# Patient Record
Sex: Female | Born: 1935
Health system: Southern US, Community
[De-identification: ages and names within clinical notes are randomized; demographics above are authoritative.]

## PROBLEM LIST (undated history)

## (undated) DIAGNOSIS — E7800 Pure hypercholesterolemia, unspecified: Secondary | ICD-10-CM

## (undated) DIAGNOSIS — C4490 Unspecified malignant neoplasm of skin, unspecified: Secondary | ICD-10-CM

## (undated) DIAGNOSIS — M1990 Unspecified osteoarthritis, unspecified site: Secondary | ICD-10-CM

## (undated) DIAGNOSIS — I219 Acute myocardial infarction, unspecified: Secondary | ICD-10-CM

## (undated) DIAGNOSIS — I2510 Atherosclerotic heart disease of native coronary artery without angina pectoris: Secondary | ICD-10-CM

## (undated) DIAGNOSIS — C50919 Malignant neoplasm of unspecified site of unspecified female breast: Secondary | ICD-10-CM

## (undated) DIAGNOSIS — N879 Dysplasia of cervix uteri, unspecified: Secondary | ICD-10-CM

## (undated) DIAGNOSIS — M4807 Spinal stenosis, lumbosacral region: Secondary | ICD-10-CM

## (undated) DIAGNOSIS — K219 Gastro-esophageal reflux disease without esophagitis: Secondary | ICD-10-CM

## (undated) DIAGNOSIS — I10 Essential (primary) hypertension: Secondary | ICD-10-CM

## (undated) DIAGNOSIS — G4730 Sleep apnea, unspecified: Secondary | ICD-10-CM

## (undated) HISTORY — DX: Acute myocardial infarction, unspecified: I21.9

## (undated) HISTORY — PX: NM MYOVIEW LTD: HXRAD82

## (undated) HISTORY — DX: Essential (primary) hypertension: I10

## (undated) HISTORY — DX: Unspecified malignant neoplasm of skin, unspecified: C44.90

## (undated) HISTORY — DX: Pure hypercholesterolemia, unspecified: E78.00

## (undated) HISTORY — PX: MASTECTOMY: SHX3

## (undated) HISTORY — PX: OTHER SURGICAL HISTORY: SHX169

## (undated) HISTORY — PX: COLONOSCOPY: SHX174

## (undated) HISTORY — DX: Malignant neoplasm of unspecified site of unspecified female breast: C50.919

## (undated) HISTORY — PX: ABDOMINAL HYSTERECTOMY: SHX81

## (undated) HISTORY — PX: DIAGNOSTIC MAMMOGRAM: HXRAD719

## (undated) HISTORY — DX: Atherosclerotic heart disease of native coronary artery without angina pectoris: I25.10

## (undated) HISTORY — DX: Gastro-esophageal reflux disease without esophagitis: K21.9

## (undated) HISTORY — DX: Unspecified osteoarthritis, unspecified site: M19.90

## (undated) HISTORY — DX: Sleep apnea, unspecified: G47.30

## (undated) HISTORY — DX: Spinal stenosis, lumbosacral region: M48.07

## (undated) HISTORY — DX: Dysplasia of cervix uteri, unspecified: N87.9

---

## 1997-08-09 ENCOUNTER — Other Ambulatory Visit: Admission: RE | Admit: 1997-08-09 | Discharge: 1997-08-09 | Payer: Self-pay | Admitting: *Deleted

## 1998-02-19 ENCOUNTER — Other Ambulatory Visit: Admission: RE | Admit: 1998-02-19 | Discharge: 1998-02-19 | Payer: Self-pay | Admitting: Obstetrics and Gynecology

## 1998-03-14 ENCOUNTER — Other Ambulatory Visit: Admission: RE | Admit: 1998-03-14 | Discharge: 1998-03-14 | Payer: Self-pay | Admitting: Obstetrics and Gynecology

## 1999-01-13 DIAGNOSIS — I2510 Atherosclerotic heart disease of native coronary artery without angina pectoris: Secondary | ICD-10-CM

## 1999-01-13 HISTORY — PX: CARDIAC CATHETERIZATION: SHX172

## 1999-01-13 HISTORY — DX: Atherosclerotic heart disease of native coronary artery without angina pectoris: I25.10

## 1999-01-13 HISTORY — PX: CORONARY STENT INTERVENTION: CATH118234

## 1999-02-03 ENCOUNTER — Encounter: Payer: Self-pay | Admitting: Internal Medicine

## 1999-02-03 ENCOUNTER — Encounter: Admission: RE | Admit: 1999-02-03 | Discharge: 1999-02-03 | Payer: Self-pay | Admitting: Internal Medicine

## 1999-02-04 ENCOUNTER — Other Ambulatory Visit: Admission: RE | Admit: 1999-02-04 | Discharge: 1999-02-04 | Payer: Self-pay | Admitting: Internal Medicine

## 1999-02-06 ENCOUNTER — Inpatient Hospital Stay (HOSPITAL_COMMUNITY): Admission: RE | Admit: 1999-02-06 | Discharge: 1999-02-14 | Payer: Self-pay | Admitting: Internal Medicine

## 1999-08-17 DIAGNOSIS — I25119 Atherosclerotic heart disease of native coronary artery with unspecified angina pectoris: Secondary | ICD-10-CM | POA: Insufficient documentation

## 2000-01-13 HISTORY — PX: CORONARY ARTERY BYPASS GRAFT: SHX141

## 2010-05-30 NOTE — H&P (Signed)
[place]  Patient:    Stephanie Shea                         MRN: [id number] Adm. Date:  [id number] Attending:  Silvestre Mesi Dictator:   Donzetta Matters, P.A.                         History and Physical  DATE OF BIRTH:  October 27, 1930  CHIEF COMPLAINT:  Chest heaviness.  HISTORY OF PRESENT ILLNESS:  This is a 77 year old female that was seen for Dr.  Lendell Caprice when she was having some increase in arm heaviness.  This has occurred the past two to three months with exercise.  She does take some water aerobics that  causes mild discomfort.  She did have a treadmill done on February 05, 1999 which was strongly positive at three minutes in Bruce stage I.  FAMILY HISTORY:  Mother is age 61 with coronary artery bypass grafting.  Father  died at age 6 of MI.  SOCIAL HISTORY:  She is married.  She does drink caffeinated coffee.  Denies any tobacco use.  Denies any drug abuse.  PAST MEDICAL HISTORY:  Notable for hypertension, postmenopausal.  PAST SURGICAL HISTORY:  Includes breast augmentation.  ALLERGIES:  She is sensitive to codeine, which causes nausea.  CURRENT MEDICATIONS: 1.  PremPro 1 q.d. 2.  Enteric-coated aspirin 81 mg q.d. 3.  Vitamin E 400 units q.d. 4.  Zestoretic 20/25 1/2 tablet q.d. 5.  Toprol XL 50 mg 1/2 tablet q.d. 6.  Tylenol as-needed. 7.  Tums 2 tablets q.d. 8.  Multivitamin 1 q.d.  REVIEW OF SYSTEMS:  She denies any fever, chills, or sweats.  States her weight has remained stable.  She has had no swelling or edema, no problems with sleeping.  Denies any chest pain, palpitations, or dyspnea.  Denies any claudication, no cough or wheezing.  Continues to be a nonsmoker.  No sputum production.  She has some  constipation secondary to calcium supplements.  Denies any nausea, vomiting, or  heartburn.  Denies any urinary problems.  No nocturia.  States she has left hip  pain and some fatigue but otherwise gait  is steady.  She does not use any assistive devices for ambulation.  Denies any faintness, no strokes, no syncope, no dizziness.  No change in memory.  Otherwise all other systems are negative.  PHYSICAL EXAMINATION:  VITAL SIGNS:  Weight 161 pounds.  Blood pressure 124/70, pulse 76, respirations 18. She is afebrile.  GENERAL:  She is a well-developed, well-nourished female, at the present time with good nutrition.  Very fit-appearing female that is well cared for.  She is oriented to person, place, time, and situation.  Appears to have good judgment and insight.  HEENT:  Pupils equal.  EOMI.  Mouth and pharynx benign.  NECK:  Supple, no JVD, no bruits, no adenopathy.  CHEST:  Clear to auscultation.  There is palpable chest wall discomfort.  HEART:  Regular rate and rhythm, no murmurs noted.  ABDOMEN:  Soft, flat, active bowel sounds; no hepatosplenomegaly.  No abdominal  bruits.  EXTREMITIES:  Good pulses at dorsalis pedis.  No edema noted to extremities. There is no cyanosis or clubbing to fingers or digits.  She is ambulatory without any  difficulty.  SKIN:   Intact.  No cyanosis or rashes noted.  NEUROLOGIC:  Grossly nonfocal.  LABORATORY DATA:  Chest x-ray shows no acute disease on February 05, 1999. Abnormal treadmill with EKG changes.  Labs will be obtained before heart catheterization. Diagnosis is abnormal EKG, hypertension, angina pectoris.  Labs reviewed show WBC at 5900, hemoglobin 12.3, hematocrit 35.9, platelet count 273,000.  Urinalysis normal.  PTT 23.4, PT 11.4 with INR 0.92.  Basic metabolic panel does show sodium of 140, potassium 4.4, chloride 103, CO2 29, glucose 87, BUN 16, creatinine 0.5.  PLAN:  Heart catheterization with intervention as indicated. DD:  02/06/99 TD:  02/06/99 Job: [id number]

## 2010-05-30 NOTE — Cardiovascular Report (Signed)
[place]  Patient:    Stephanie Shea                         MRN: [id number] Proc. Date: 02/06/99 Adm. Date:  [id number] Attending:  Silvestre Mesi CC:         Janae Bridgeman. Eloise Harman., M.D.             John R. Aleen Campi, M.D.             Cardiac Catheterization Laboratory                        Cardiac Catheterization  PROCEDURES PERFORMED:  1. Left heart catheterization.  2. Coronary cineangiography.  3. Left ventricular cineangiography.  4. Abdominal aortogram.  5. IVUS of left main and left anterior descending artery.  6. Angioplasty with primary stent placement in the proximal left anterior     descending artery.  7. Percutaneous transluminal coronary angioplasty of the first diagonal branch.  8. Angioplasty with primary stent placement of the proximal circumflex.  9. Percutaneous transluminal coronary angioplasty of the distal left anterior     descending artery. 10. Attempt to open totally occluded distal circumflex with probable atheromatous     embolus. 11. Perclose of the right femoral artery.  INDICATIONS:  This 77 year old female presented with several months history of aching in her arm on exertion and her resting electrocardiogram was abnormal. he has a history of hypertension, successfully treated with antihypertensives.  A stress test was markedly positive, indicating evidence of myocardial ischemia.   DESCRIPTION OF PROCEDURE:  After signing an informed consent, the patient was premedicated with 50 mg Benadryl intravenously and brought to the cardiac catheterization laboratory.  Her right groin was prepped and draped a sterile fashion and anesthetized locally with 1% Lidocaine.  A 6-French introducer sheath was inserted percutaneously into the right femoral artery.  Six [place] #4 Judkins coronary catheters were used to make injections into the coronary arteries.  A 6-French pigtail catheter was used to measure  pressures in the left ventricle and aorta and to make midstream injections into the left ventricle and abdominal aorta. After noting a narrow ostium of the left main in association with severe stenosis in her first diagonal branch and proximal circumflex, we discussed these findings with two other cardiologists who were present the laboratory and elected to proceed with an IVUS examination to look for significant plaque in the left main coronary artery.  If this was present, she would be a candidate for surgery.  If not, she should have angioplasty of her proximal lesions because of the presence of distal stenosis also and making her less of an ideal candidate for surgery.  A 6-French JL-4 guide catheter was selected and after proper preparation, this as inserted through the right femoral artery sheath and advanced to the root of the aorta.  The tip of this guide catheter was engaged in the ostium of the left coronary artery.  A short high-torque floppy guide wire was inserted through the guide catheter and advanced into the left coronary artery.  With moderate difficult, it was then advanced into the LAD with the tip advanced into the mid and distal segment of the left anterior descending artery.  We then inserted an IVUS catheter, which was advanced into the mid LAD and performed an automatic withdrawal run, looking at the proximal LAD and left main coronary artery.  A  surprising finding of a severe stenosis within the proximal LAD was found, where angiographically it appeared to be a mild stenosis.  When the IVUS reached the eft main, we found a naturally tapering left main toward the ostium, but no evidence for significant plaque.  With these findings and after further discussion with he two other cardiologists, we elected to proceed with angioplasty procedures.  After removing the IVUS catheter, we selected a 3.0 x 15 mm Royale stent delivery system and after proper  preparation this was inserted over the guide wire and advanced within the proximal LAD lesion.  The stent was then deployed with one inflation at 16 atm x 30 seconds.  We then withdrew this deployment balloon and  redirected the guide catheter into the first diagonal branch and after moderate  difficulty it was advanced through the proximal lesion and into the distal diagonal branch.  We then selected a balloon catheter which was a 2.5 x 10 mm balloon catheter, which was advanced over the guide wire and positioned within the proximal diagonal lesion.  One inflation was made at 6 atm x 2 minutes.  After this inflation, the balloon was removed and injection again into the LAD showed an excellent angiographic result in both angioplasty sites.  We then withdrew this balloon and redirected the guide wire into the circumflex and positioned the tip in the distal circumflex.  We then selected a 3.5 x 11 mm Royale stent deployment system and after proper preparation, this was inserted over the guide wire and positioned within the proximal circumflex.  This stent was then deployed with one inflation at 14 atm x 30 seconds.  After this inflation, the balloon catheter was removed and injection again into the left coronary artery showed an excellent angiographic result in the proximal circumflex lesion.  With these findings, we then redirected the guide wire into the LAD and directed it into the distal LAD.  We were able to guide it across the distal severe stenosis in the LAD and at that time, we reinserted the 2.5 x 10 mm balloon catheter and advanced it over the guide wire and into the distal LAD lesion.  One inflation as made at 6 atm x 2 minutes.  After this final inflation, another injection into he left coronary artery showed an excellent angiographic result in this distal LAD  lesion.  At that time, we noted a hang-up of dye in the distal circumflex and further injections showed  total occlusion of the most distal small obtuse marginal branch.  We redirected the wire into this area and after multiple attempts, we ere  unable to cross through this stenosis, even with backup of a balloon catheter within the proximal portion of the this branch and we felt that this probably represented an atheromatous embolus from the proximal circumflex, which embolized during the stent placement.  We did not feel that it was an in situ thrombus because it was very hard and we were unable to pass a guide wire through it, which would have been the case if it were a fresh soft thrombus.  We failed to reestablish antegrade flow in this distal circumflex branch.  The patient tolerated this procedure well and no complications were otherwise noted.  She denied having any chest pain.  Her rhythm remained normal, without ectopy or arrhythmia.  At he end of the procedure, the catheter and sheath were removed from the right femoral artery and hemostasis was easily obtained with a Perclose closure system.  MEDICATIONS GIVEN:  Versed 1 mg IV x 3, Versed 2 mg IV x 1, morphine 1 mg IV x , morphine 2 mg IV x 1.  RESULTS:  HEMODYNAMIC DATA:  Left ventricular pressure 143/0-15.  Aortic pressure 148/79,  with a mean of 107.  Left ventricular ejection fraction was estimated at approximately 70%.  CINE FINDINGS: 1. Left main coronary artery:  The initial injections into the left coronary artery    showed a 50% or greater ostial lesion in the left main with gradual tapering  toward the distal segment.  There was lack of dye flushing into the aortic root.    Follow-up cines primarily of the left main after nitroglycerin intracoronary was    given, showed a marked improvement in the left main appearance.  2. Left anterior descending artery:  The proximal LAD angiographically has a mild    to moderate stenosis of less than 50% in its proximal segment.  This lesion as    fairly hazy.   The first diagonal branch had a focal eccentric 95% stenosis in    its proximal segment.  The distal LAD near the apex had a critical 95% stenosis    which was eccentric and focal.  There is a moderate stenosis at the apex.  3. Circumflex coronary artery:  The circumflex coronary artery had a focal    eccentric 90% stenosis near its takeoff.  The distal circumflex appeared normal.  4. Right coronary artery:  The right coronary artery appears normal.  LEFT VENTRICULAR CINEANGIOGRAM:  The left ventricular chamber size and contractility are normal.  The mitral and aortic valves appear normal.  The left ventricular wall thickness appears normal.  ABDOMINAL AORTOGRAM:  The abdominal aorta, renal arteries, and common iliac arteries all appear normal.  ANGIOPLASTY CINES:  Cines taken during the angioplasty procedure showed proper positioning of the guide wire, balloon catheters, and stent deployment systems within all four lesions.  Follow-up injections into these same areas showed excellent angiographic result in all four sites with 0% residual lesion and normal antegrade flow.  There was no sign of localized dissection or clot within the four lesions.  Final injections showed distal occlusion of the circumflex and the most distal obtuse marginal branch, as previously mentioned, with further cines showing the guide wire and balloon catheter within this branch and failure to cross the  embolized area with the guide wire.  Final cines showed continued closure of this branch.  FINAL DIAGNOSES:  1. Two-vessel coronary artery disease.  2. Congenitally tapered left main without significant atherosclerosis.  3. Severe proximal left anterior descending artery lesion seen on intravascular     ultrasound.  4. Normal left ventricular function.  5. Normal mitral and aortic valves.  6. Normal abdominal aorta and renal arteries.  7. Successful stenting of the proximal left anterior  descending artery.  8. Successful percutaneous transluminal coronary angioplasty of the first     diagonal.  9. Successful stenting of the proximal circumflex. 10. Successful percutaneous transluminal coronary angioplasty of the distal     left anterior descending artery. 11. Unsuccessful attempt to reopen the distal circumflex obtuse marginal branch     with probable atheromatous embolus. 12. Successful Perclose of the right femoral artery.  DISPOSITION:  Will admit to ICU for further monitoring and continue on ReoPro drip. DD:  02/06/99 TD:  02/07/99 Job: [id number]

## 2010-05-30 NOTE — Discharge Summary (Signed)
[place]  Patient:    Stephanie Shea                         MRN: [id number] Adm. Date:  [id number] Disc. Date: [id number] Attending:  Silvestre Mesi Dictator:   Donzetta Matters, P.A.-C. CC:         Janae Bridgeman. Eloise Harman., M.D.             [facility]                           Discharge Summary  DATE OF BIRTH:  Oct 26, 1933  PRINCIPAL DIAGNOSES ON DISCHARGE:  1. Two-vessel coronary artery disease.  2. Myocardial infarction post embolization of distal OM-1.  3. Atrial fibrillation resolved.  4. Hypertension.  PROCEDURES:  Left heart catheterization February 06, 1999, with percutaneous transluminal coronary angioplasty and stent left anterior descending, PTCA distal left anterior descending, PTCA first diagonal with IVUS, left main and left anterior descending, PTCA and stent of proximal circumflex, attempted opening of occluded distal obtuse marginal #1, 2D echocardiogram, February 07, 1999.  CONSULTS:  1. John R. Aleen Campi, M.D., invasive cardiology.  2. Dietary.  CONDITION ON DISCHARGE:  Improved.  HOSPITAL COURSE:  This is a 77 year old female that was admitted electively for  heart catheterization after marked changes on treadmill done in the office. Screening labs were obtained and they were stable.  She underwent cardiac catheterization on January 25 with results showing two-vessel coronary artery disease, a congenitally tapered left main without atheroma, severe proximal LAD  lesion seen on IVUS, with normal left ventricular function.  She has normal mitral and aortic valve, normal abdominal aorta and renal arteries.  She underwent successful stenting of the proximal LAD, successful PTCA of the first diagonal,  successful stenting of the proximal circumflex, successful PTCA of distal LAD, unsuccessful attempt reopen the distal OM-1 branch with embolus.  She had successful Perclose of the right femoral artery.   She did have ReoPro drip overnight.  The next day, she remained comfortable.  CKs did return showing increase to 1430 with MB of 185.8 with index 13.0.  This was repeated again on January 26 with CK decreased down to 1116 with MB 138.3, relative index 12.4. he was then followed over the weekend.  Exam continued to be benign.  Groin was okay without any difficulty.  She has been ambulated in the halls.  She had been started on Coumadin after she had an episode of atrial fibrillation that occurred on January 28.  Also, her Toprol was increased to 150 mg daily.  A 2D echocardiogram showed left ventricular ejection fraction of 60% with overall good normal left ventricular contractility.  There was a small area of hypokinesis at the apex, otherwise normal.  On January 30, she continued to remain stable.  She was still on Cardizem drip at that time.  She was then stopped on that and encouraged to begin walking in the halls which she tolerated without any difficulty.  There were no  further episodes of the atrial fibrillation.  Long discussions were held with her regarding that she would need to have a cardiac rehab facility near her home in  [facility].  Her INRs were followed daily while she was anticoagulated with the Coumadin.  She continued on full aspirin a day as well as Plavix daily.  By February 14, 1999, INR was  then up to 1.6.  Monitor continued to show sinus brady to 58 or sinus rhythm to 78.  She has been ambulating in the halls without any difficulty.  She was visited with the dietary to discuss low fat, low cholesterol diet.  Also, while in hospital, she was started on lipid-lowering drugs of Zocor 40 mg daily; and, by February 14, 1999, it was felt that she was stable enough for discharge to home in a stable and improved condition.  LABORATORY DATA:  CBC on admission did show a white count at 11,900, hemoglobin  12.5, hematocrit 35.2, platelet count 363.  Pro times  were followed closely. On January 30, her pro time was 13.9 with INR of 1.2.  The next day, it was 1.3, then 1.5; and on February 14, 1999, pro time was 16.7, INR was 1.6.  Chemistries on January 29 showed sodium at 134, potassium 3.6, chloride 96, CO2 29, BUN 9, creatinine 0.4, glucose 118.  Initial total CK on January 25, of 765, CK-MB of 115.7, relative index of 15.1.  This was repeated again eight hours later with total CK at 1430, CK-MB 185.8, relative index 13.0, and third set of enzymes did show total CK at 1116, CK-MB 138.3, relative index 12.4.  A 2-Dechocardiogram with ejection fraction of 50 to 65%, apical hypokinesis, otherwise normal aortic valve, normal mitral valve, normal right ventricular size, normal left ventricular wall thickness.  EKG, January 25, showed normal sinus rhythm with nonspecific changes.  This was repeated again on January 26 which showed normal sinus rhythm, rate at 92, with nonspecific changes.  Chest x-ray done at the office showed no acute abnormalities.  Patient was felt  ready for discharge to home on February 14, 1999.  DISCHARGE MEDICATIONS:  1. Coumadin 6 mg p.o. daily.  2. Prempro 0.625mg /2.5 mg daily.  3. Toprol XL 150 mg daily.  4. Zocor 40 mg daily at bedtime,  5. Zestoretic 20/25 one half tablet daily.  6. Coated aspirin 325 daily.  7. Vitamin E 400 units daily.  8. Multivitamin daily.  9. Plavix 75 mg daily. 10. Tums two tablets daily.  ACTIVITY:  As tolerated.  DIET:  Follow a low-cholesterol diet.  DISCHARGE INSTRUCTIONS:  Continue to monitor the right groin.  Recheck pro time  Monday.  She is to see Dr. Lucas Mallow in two weeks.  Rehab program at [facility], [place], [place].  Phone number is [phone].  She is o come by the office to sign transfer information so we can better coordinate her  care closer to home. DD:  02/14/99 TD:  02/15/99 Job: [id number]

## 2014-12-13 HISTORY — PX: TRANSTHORACIC ECHOCARDIOGRAM: SHX275

## 2015-01-22 DIAGNOSIS — M4806 Spinal stenosis, lumbar region: Secondary | ICD-10-CM | POA: Diagnosis not present

## 2015-01-22 DIAGNOSIS — M4310 Spondylolisthesis, site unspecified: Secondary | ICD-10-CM | POA: Diagnosis not present

## 2015-01-22 DIAGNOSIS — M545 Low back pain: Secondary | ICD-10-CM | POA: Diagnosis not present

## 2015-01-24 DIAGNOSIS — D0471 Carcinoma in situ of skin of right lower limb, including hip: Secondary | ICD-10-CM | POA: Diagnosis not present

## 2015-01-24 DIAGNOSIS — C44712 Basal cell carcinoma of skin of right lower limb, including hip: Secondary | ICD-10-CM | POA: Diagnosis not present

## 2015-01-24 DIAGNOSIS — C44612 Basal cell carcinoma of skin of right upper limb, including shoulder: Secondary | ICD-10-CM | POA: Diagnosis not present

## 2015-01-24 DIAGNOSIS — D485 Neoplasm of uncertain behavior of skin: Secondary | ICD-10-CM | POA: Diagnosis not present

## 2015-01-29 DIAGNOSIS — M4806 Spinal stenosis, lumbar region: Secondary | ICD-10-CM | POA: Diagnosis not present

## 2015-01-29 DIAGNOSIS — M5125 Other intervertebral disc displacement, thoracolumbar region: Secondary | ICD-10-CM | POA: Diagnosis not present

## 2015-01-29 DIAGNOSIS — M47816 Spondylosis without myelopathy or radiculopathy, lumbar region: Secondary | ICD-10-CM | POA: Diagnosis not present

## 2015-02-05 DIAGNOSIS — M4806 Spinal stenosis, lumbar region: Secondary | ICD-10-CM | POA: Diagnosis not present

## 2015-02-20 DIAGNOSIS — I10 Essential (primary) hypertension: Secondary | ICD-10-CM | POA: Diagnosis not present

## 2015-02-20 DIAGNOSIS — E785 Hyperlipidemia, unspecified: Secondary | ICD-10-CM | POA: Diagnosis not present

## 2015-02-20 DIAGNOSIS — R7301 Impaired fasting glucose: Secondary | ICD-10-CM | POA: Diagnosis not present

## 2015-02-20 DIAGNOSIS — M859 Disorder of bone density and structure, unspecified: Secondary | ICD-10-CM | POA: Diagnosis not present

## 2015-02-27 DIAGNOSIS — Z780 Asymptomatic menopausal state: Secondary | ICD-10-CM | POA: Diagnosis not present

## 2015-02-27 DIAGNOSIS — Z1212 Encounter for screening for malignant neoplasm of rectum: Secondary | ICD-10-CM | POA: Diagnosis not present

## 2015-02-27 DIAGNOSIS — I10 Essential (primary) hypertension: Secondary | ICD-10-CM | POA: Diagnosis not present

## 2015-02-27 DIAGNOSIS — Z0000 Encounter for general adult medical examination without abnormal findings: Secondary | ICD-10-CM | POA: Diagnosis not present

## 2015-02-27 DIAGNOSIS — M1990 Unspecified osteoarthritis, unspecified site: Secondary | ICD-10-CM | POA: Diagnosis not present

## 2015-02-27 DIAGNOSIS — E785 Hyperlipidemia, unspecified: Secondary | ICD-10-CM | POA: Diagnosis not present

## 2015-05-01 DIAGNOSIS — R112 Nausea with vomiting, unspecified: Secondary | ICD-10-CM | POA: Diagnosis not present

## 2015-05-01 DIAGNOSIS — I10 Essential (primary) hypertension: Secondary | ICD-10-CM | POA: Diagnosis not present

## 2015-05-01 DIAGNOSIS — A084 Viral intestinal infection, unspecified: Secondary | ICD-10-CM | POA: Diagnosis not present

## 2015-05-06 DIAGNOSIS — G4733 Obstructive sleep apnea (adult) (pediatric): Secondary | ICD-10-CM | POA: Diagnosis not present

## 2015-05-31 DIAGNOSIS — Z780 Asymptomatic menopausal state: Secondary | ICD-10-CM | POA: Diagnosis not present

## 2015-06-04 DIAGNOSIS — E785 Hyperlipidemia, unspecified: Secondary | ICD-10-CM | POA: Diagnosis not present

## 2015-06-04 DIAGNOSIS — I10 Essential (primary) hypertension: Secondary | ICD-10-CM | POA: Diagnosis not present

## 2015-06-04 DIAGNOSIS — I25810 Atherosclerosis of coronary artery bypass graft(s) without angina pectoris: Secondary | ICD-10-CM | POA: Diagnosis not present

## 2015-06-04 DIAGNOSIS — Z951 Presence of aortocoronary bypass graft: Secondary | ICD-10-CM | POA: Diagnosis not present

## 2015-06-04 DIAGNOSIS — G4733 Obstructive sleep apnea (adult) (pediatric): Secondary | ICD-10-CM | POA: Diagnosis not present

## 2015-07-01 DIAGNOSIS — R7301 Impaired fasting glucose: Secondary | ICD-10-CM | POA: Diagnosis not present

## 2015-07-01 DIAGNOSIS — M859 Disorder of bone density and structure, unspecified: Secondary | ICD-10-CM | POA: Diagnosis not present

## 2015-07-01 DIAGNOSIS — N3946 Mixed incontinence: Secondary | ICD-10-CM | POA: Diagnosis not present

## 2015-07-01 DIAGNOSIS — R3915 Urgency of urination: Secondary | ICD-10-CM | POA: Diagnosis not present

## 2015-07-01 DIAGNOSIS — E785 Hyperlipidemia, unspecified: Secondary | ICD-10-CM | POA: Diagnosis not present

## 2015-07-01 DIAGNOSIS — I10 Essential (primary) hypertension: Secondary | ICD-10-CM | POA: Diagnosis not present

## 2015-07-02 DIAGNOSIS — L821 Other seborrheic keratosis: Secondary | ICD-10-CM | POA: Diagnosis not present

## 2015-07-02 DIAGNOSIS — Z85828 Personal history of other malignant neoplasm of skin: Secondary | ICD-10-CM | POA: Diagnosis not present

## 2015-07-02 DIAGNOSIS — L570 Actinic keratosis: Secondary | ICD-10-CM | POA: Diagnosis not present

## 2015-07-02 DIAGNOSIS — Z08 Encounter for follow-up examination after completed treatment for malignant neoplasm: Secondary | ICD-10-CM | POA: Diagnosis not present

## 2015-07-02 DIAGNOSIS — D225 Melanocytic nevi of trunk: Secondary | ICD-10-CM | POA: Diagnosis not present

## 2015-07-02 DIAGNOSIS — L820 Inflamed seborrheic keratosis: Secondary | ICD-10-CM | POA: Diagnosis not present

## 2015-07-02 DIAGNOSIS — L814 Other melanin hyperpigmentation: Secondary | ICD-10-CM | POA: Diagnosis not present

## 2015-07-02 DIAGNOSIS — L538 Other specified erythematous conditions: Secondary | ICD-10-CM | POA: Diagnosis not present

## 2015-07-18 DIAGNOSIS — H35372 Puckering of macula, left eye: Secondary | ICD-10-CM | POA: Diagnosis not present

## 2015-07-18 DIAGNOSIS — H524 Presbyopia: Secondary | ICD-10-CM | POA: Diagnosis not present

## 2015-07-18 DIAGNOSIS — H11151 Pinguecula, right eye: Secondary | ICD-10-CM | POA: Diagnosis not present

## 2015-07-18 DIAGNOSIS — Z961 Presence of intraocular lens: Secondary | ICD-10-CM | POA: Diagnosis not present

## 2015-07-18 DIAGNOSIS — H26492 Other secondary cataract, left eye: Secondary | ICD-10-CM | POA: Diagnosis not present

## 2015-07-18 DIAGNOSIS — H47012 Ischemic optic neuropathy, left eye: Secondary | ICD-10-CM | POA: Diagnosis not present

## 2015-07-19 DIAGNOSIS — M1990 Unspecified osteoarthritis, unspecified site: Secondary | ICD-10-CM | POA: Diagnosis not present

## 2015-07-19 DIAGNOSIS — E785 Hyperlipidemia, unspecified: Secondary | ICD-10-CM | POA: Diagnosis not present

## 2015-07-19 DIAGNOSIS — I10 Essential (primary) hypertension: Secondary | ICD-10-CM | POA: Diagnosis not present

## 2015-07-19 DIAGNOSIS — K219 Gastro-esophageal reflux disease without esophagitis: Secondary | ICD-10-CM | POA: Diagnosis not present

## 2015-09-24 DIAGNOSIS — Z23 Encounter for immunization: Secondary | ICD-10-CM | POA: Diagnosis not present

## 2015-11-18 DIAGNOSIS — E785 Hyperlipidemia, unspecified: Secondary | ICD-10-CM | POA: Diagnosis not present

## 2015-11-18 DIAGNOSIS — I10 Essential (primary) hypertension: Secondary | ICD-10-CM | POA: Diagnosis not present

## 2015-11-18 DIAGNOSIS — M859 Disorder of bone density and structure, unspecified: Secondary | ICD-10-CM | POA: Diagnosis not present

## 2015-11-18 DIAGNOSIS — R7301 Impaired fasting glucose: Secondary | ICD-10-CM | POA: Diagnosis not present

## 2015-11-18 LAB — LIPID PANEL
Cholesterol: 199 (ref 0–200)
HDL: 52 (ref 35–70)
LDL Cholesterol: 55
Triglycerides: 276 — AB (ref 40–160)

## 2015-11-18 LAB — BASIC METABOLIC PANEL
BUN: 19 (ref 4–21)
BUN: 19 (ref 4–21)
Creatinine: 0.6 (ref 0.5–1.1)
Creatinine: 0.6 (ref 0.5–1.1)
Glucose: 113
Glucose: 113
Potassium: 4.4 (ref 3.4–5.3)
Potassium: 4.4 (ref 3.4–5.3)
Sodium: 140 (ref 137–147)
Sodium: 140 (ref 137–147)

## 2015-11-18 LAB — VITAMIN D 25 HYDROXY (VIT D DEFICIENCY, FRACTURES): Vit D, 25-Hydroxy: 30.7

## 2015-11-18 LAB — CBC AND DIFFERENTIAL
HCT: 38 (ref 36–46)
Hemoglobin: 12.6 (ref 12.0–16.0)
Platelets: 190 (ref 150–399)
WBC: 5.6

## 2015-11-18 LAB — HEPATIC FUNCTION PANEL
ALT: 20 (ref 7–35)
ALT: 20 (ref 7–35)
AST: 20 (ref 13–35)
AST: 20 (ref 13–35)
Alkaline Phosphatase: 42 (ref 25–125)
Alkaline Phosphatase: 42 (ref 25–125)
Bilirubin, Total: 0.7
Bilirubin, Total: 0.7

## 2015-11-18 LAB — TSH: TSH: 1.47 (ref 0.41–5.90)

## 2015-11-25 DIAGNOSIS — G4730 Sleep apnea, unspecified: Secondary | ICD-10-CM | POA: Diagnosis not present

## 2015-11-25 DIAGNOSIS — M1990 Unspecified osteoarthritis, unspecified site: Secondary | ICD-10-CM | POA: Diagnosis not present

## 2015-11-25 DIAGNOSIS — I10 Essential (primary) hypertension: Secondary | ICD-10-CM | POA: Diagnosis not present

## 2015-12-02 DIAGNOSIS — G4733 Obstructive sleep apnea (adult) (pediatric): Secondary | ICD-10-CM | POA: Diagnosis not present

## 2015-12-04 DIAGNOSIS — I10 Essential (primary) hypertension: Secondary | ICD-10-CM | POA: Diagnosis not present

## 2015-12-04 DIAGNOSIS — I2510 Atherosclerotic heart disease of native coronary artery without angina pectoris: Secondary | ICD-10-CM | POA: Diagnosis not present

## 2015-12-04 DIAGNOSIS — E7800 Pure hypercholesterolemia, unspecified: Secondary | ICD-10-CM | POA: Diagnosis not present

## 2015-12-10 DIAGNOSIS — R928 Other abnormal and inconclusive findings on diagnostic imaging of breast: Secondary | ICD-10-CM | POA: Diagnosis not present

## 2015-12-10 DIAGNOSIS — C50911 Malignant neoplasm of unspecified site of right female breast: Secondary | ICD-10-CM | POA: Diagnosis not present

## 2015-12-30 DIAGNOSIS — N3946 Mixed incontinence: Secondary | ICD-10-CM | POA: Diagnosis not present

## 2016-01-21 DIAGNOSIS — D1801 Hemangioma of skin and subcutaneous tissue: Secondary | ICD-10-CM | POA: Diagnosis not present

## 2016-01-21 DIAGNOSIS — I8393 Asymptomatic varicose veins of bilateral lower extremities: Secondary | ICD-10-CM | POA: Diagnosis not present

## 2016-01-21 DIAGNOSIS — Z08 Encounter for follow-up examination after completed treatment for malignant neoplasm: Secondary | ICD-10-CM | POA: Diagnosis not present

## 2016-01-21 DIAGNOSIS — L814 Other melanin hyperpigmentation: Secondary | ICD-10-CM | POA: Diagnosis not present

## 2016-01-21 DIAGNOSIS — L905 Scar conditions and fibrosis of skin: Secondary | ICD-10-CM | POA: Diagnosis not present

## 2016-01-21 DIAGNOSIS — L570 Actinic keratosis: Secondary | ICD-10-CM | POA: Diagnosis not present

## 2016-01-21 DIAGNOSIS — Z85828 Personal history of other malignant neoplasm of skin: Secondary | ICD-10-CM | POA: Diagnosis not present

## 2016-01-21 DIAGNOSIS — L298 Other pruritus: Secondary | ICD-10-CM | POA: Diagnosis not present

## 2016-01-21 DIAGNOSIS — L538 Other specified erythematous conditions: Secondary | ICD-10-CM | POA: Diagnosis not present

## 2016-01-21 DIAGNOSIS — L821 Other seborrheic keratosis: Secondary | ICD-10-CM | POA: Diagnosis not present

## 2016-01-21 DIAGNOSIS — L820 Inflamed seborrheic keratosis: Secondary | ICD-10-CM | POA: Diagnosis not present

## 2016-02-03 DIAGNOSIS — N3946 Mixed incontinence: Secondary | ICD-10-CM | POA: Diagnosis not present

## 2016-02-03 DIAGNOSIS — R3129 Other microscopic hematuria: Secondary | ICD-10-CM | POA: Diagnosis not present

## 2016-02-03 DIAGNOSIS — R3915 Urgency of urination: Secondary | ICD-10-CM | POA: Diagnosis not present

## 2016-02-03 DIAGNOSIS — R350 Frequency of micturition: Secondary | ICD-10-CM | POA: Diagnosis not present

## 2016-02-24 DIAGNOSIS — G4733 Obstructive sleep apnea (adult) (pediatric): Secondary | ICD-10-CM | POA: Diagnosis not present

## 2016-03-03 DIAGNOSIS — I10 Essential (primary) hypertension: Secondary | ICD-10-CM | POA: Diagnosis not present

## 2016-03-03 DIAGNOSIS — M859 Disorder of bone density and structure, unspecified: Secondary | ICD-10-CM | POA: Diagnosis not present

## 2016-03-03 DIAGNOSIS — R7301 Impaired fasting glucose: Secondary | ICD-10-CM | POA: Diagnosis not present

## 2016-03-03 DIAGNOSIS — E785 Hyperlipidemia, unspecified: Secondary | ICD-10-CM | POA: Diagnosis not present

## 2016-03-03 LAB — LIPID PANEL
Cholesterol: 159 (ref 0–200)
HDL: 49 (ref 35–70)
LDL Cholesterol: 36
Triglycerides: 182 — AB (ref 40–160)

## 2016-03-03 LAB — BASIC METABOLIC PANEL
BUN: 14 (ref 4–21)
Creatinine: 0.8 (ref 0.5–1.1)
Glucose: 111
Potassium: 4.3 (ref 3.4–5.3)
Sodium: 145 (ref 137–147)

## 2016-03-03 LAB — TSH: TSH: 1.15 (ref 0.41–5.90)

## 2016-03-03 LAB — HEPATIC FUNCTION PANEL
ALT: 21 (ref 7–35)
AST: 22 (ref 13–35)
Alkaline Phosphatase: 45 (ref 25–125)
Bilirubin, Total: 0.6

## 2016-03-03 LAB — CBC AND DIFFERENTIAL
HCT: 39 (ref 36–46)
Hemoglobin: 13 (ref 12.0–16.0)
Platelets: 242 (ref 150–399)
WBC: 4.6

## 2016-03-03 LAB — VITAMIN D 25 HYDROXY (VIT D DEFICIENCY, FRACTURES): Vit D, 25-Hydroxy: 38.3

## 2016-03-11 DIAGNOSIS — E785 Hyperlipidemia, unspecified: Secondary | ICD-10-CM | POA: Diagnosis not present

## 2016-03-11 DIAGNOSIS — I10 Essential (primary) hypertension: Secondary | ICD-10-CM | POA: Diagnosis not present

## 2016-03-11 DIAGNOSIS — Z1212 Encounter for screening for malignant neoplasm of rectum: Secondary | ICD-10-CM | POA: Diagnosis not present

## 2016-03-11 DIAGNOSIS — M1990 Unspecified osteoarthritis, unspecified site: Secondary | ICD-10-CM | POA: Diagnosis not present

## 2016-03-11 DIAGNOSIS — E119 Type 2 diabetes mellitus without complications: Secondary | ICD-10-CM | POA: Diagnosis not present

## 2016-03-11 DIAGNOSIS — Z0000 Encounter for general adult medical examination without abnormal findings: Secondary | ICD-10-CM | POA: Diagnosis not present

## 2016-05-04 DIAGNOSIS — N3946 Mixed incontinence: Secondary | ICD-10-CM | POA: Diagnosis not present

## 2016-05-04 DIAGNOSIS — R3129 Other microscopic hematuria: Secondary | ICD-10-CM | POA: Diagnosis not present

## 2016-05-05 DIAGNOSIS — Z853 Personal history of malignant neoplasm of breast: Secondary | ICD-10-CM | POA: Diagnosis not present

## 2016-05-05 DIAGNOSIS — I10 Essential (primary) hypertension: Secondary | ICD-10-CM | POA: Diagnosis not present

## 2016-08-01 DIAGNOSIS — H0012 Chalazion right lower eyelid: Secondary | ICD-10-CM | POA: Diagnosis not present

## 2016-09-08 ENCOUNTER — Non-Acute Institutional Stay: Payer: Medicare Other | Admitting: Internal Medicine

## 2016-09-08 ENCOUNTER — Encounter: Payer: Self-pay | Admitting: Internal Medicine

## 2016-09-08 VITALS — BP 132/64 | HR 62 | Temp 98.0°F | Resp 18 | Ht 67.0 in | Wt 182.0 lb

## 2016-09-08 DIAGNOSIS — G4733 Obstructive sleep apnea (adult) (pediatric): Secondary | ICD-10-CM | POA: Diagnosis not present

## 2016-09-08 DIAGNOSIS — I10 Essential (primary) hypertension: Secondary | ICD-10-CM | POA: Diagnosis not present

## 2016-09-08 DIAGNOSIS — Z9989 Dependence on other enabling machines and devices: Secondary | ICD-10-CM | POA: Diagnosis not present

## 2016-09-08 DIAGNOSIS — J309 Allergic rhinitis, unspecified: Secondary | ICD-10-CM | POA: Diagnosis not present

## 2016-09-08 DIAGNOSIS — I25118 Atherosclerotic heart disease of native coronary artery with other forms of angina pectoris: Secondary | ICD-10-CM | POA: Diagnosis not present

## 2016-09-08 DIAGNOSIS — M48061 Spinal stenosis, lumbar region without neurogenic claudication: Secondary | ICD-10-CM

## 2016-09-08 DIAGNOSIS — K219 Gastro-esophageal reflux disease without esophagitis: Secondary | ICD-10-CM | POA: Diagnosis not present

## 2016-09-08 DIAGNOSIS — Z853 Personal history of malignant neoplasm of breast: Secondary | ICD-10-CM

## 2016-09-08 DIAGNOSIS — K5900 Constipation, unspecified: Secondary | ICD-10-CM

## 2016-09-08 DIAGNOSIS — I25709 Atherosclerosis of coronary artery bypass graft(s), unspecified, with unspecified angina pectoris: Secondary | ICD-10-CM | POA: Insufficient documentation

## 2016-09-08 DIAGNOSIS — I2510 Atherosclerotic heart disease of native coronary artery without angina pectoris: Secondary | ICD-10-CM | POA: Insufficient documentation

## 2016-09-08 MED ORDER — BENEFIBER PO POWD: ORAL | 0 refills | Status: DC

## 2016-09-08 MED ORDER — FLUTICASONE PROPIONATE 50 MCG/ACT NA SUSP: 1.0000 | Freq: Every day | NASAL | 2 refills | Status: DC

## 2016-09-08 MED ORDER — ENALAPRIL MALEATE 5 MG PO TABS: 5.0000 mg | ORAL_TABLET | Freq: Every day | ORAL | 3 refills | Status: DC

## 2016-09-08 MED ORDER — ACETAMINOPHEN 500 MG PO TABS: 500.0000 mg | ORAL_TABLET | Freq: Four times a day (QID) | ORAL | 0 refills | Status: DC | PRN

## 2016-09-08 NOTE — Progress Notes (Signed)
[facility]  Provider: Blanchie Serve MD   Location:  [place] of Service:  Clinic (12)  PCP: Blanchie Serve, MD Patient Care Team: Blanchie Serve, MD as PCP - General (Internal Medicine)  Extended Emergency Contact Information Primary Emergency Contact: AJHHID,UPBD L Address: [place],  [phone] Home Phone: [phone] Relation: None  Code Status: DNR  Goals of Care: Advanced Directive information Advanced Directives 09/08/2016  Does Patient Have a Medical Advance Directive? Yes  Type of Paramedic of [employer];Living will;Out of facility DNR (pink MOST or yellow form)  Copy of [place] in Chart? No - copy requested  Pre-existing out of facility DNR order (yellow form or pink MOST form) Yellow form placed in chart (order not valid for inpatient use)      Chief Complaint  Patient presents with  . New Patient (Initial Visit)    establish care  . Medication Refill    enalapril    HPI: Patient is a 83 y.o. female seen today to establish care. She moved from [place], [place] 3 months back and resides in [place] at [employer] [facility]. She lost her husband few years back. She has her son, daughter and sister nearby. She is compliant with her medications. Has had her annual physical exam  History of breast cancer- s/p lumpectomy to right breast and tamoxifen in past followed by mastectomy and now has prosthetic right breast. Last mammogram a year back was normal  CAD- s/p CABG in past. Currently on baby aspirin, atorvastatin, enalapril and toprol xl with prn NTG. Has not required NTG. Followed by cardiology service Dr Kellie Simmering in past.   Constipation- benefiber along with fruits and vegetables help  Allergic rhinitis- currently on allegra daily, also takes flonase 2 pray to each nostrils  GERD- has occasional heartburn, currently on omeprazole 20  mg daily and has been helpful  Sleep apnea- uses CPAP at night time  Hyperlipidemia- currently on atorvastatin 80 mg daily, tolerating well         Reviewed med list, not taking tramadol, diazepam, meloxicam, promethazine any more. Has amoxicillin pills but has expired.  Past Medical History:  Diagnosis Date  . Breast cancer ([place])   . Heart attack ([place])   . High cholesterol   . Hypertension   . Skin cancer    Past Surgical History:  Procedure Laterality Date  . ABDOMINAL HYSTERECTOMY    . bone density    . CARDIAC CATHETERIZATION    . COLONOSCOPY    . DIAGNOSTIC MAMMOGRAM    . left knee replacement    . MASTECTOMY    . pap smear    . tonsilectomy      reports that she has never smoked. She has never used smokeless tobacco. She reports that she does not drink alcohol or use drugs. Social History   Social History  . Marital status: Single    Spouse name: N/A  . Number of children: N/A  . Years of education: N/A   Occupational History  . Not on file.   Social History Main Topics  . Smoking status: Never Smoker  . Smokeless tobacco: Never Used  . Alcohol use No  . Drug use: No  . Sexual activity: Not on file   Other Topics Concern  . Not on file   Social  History Narrative   Tobacco use, amount per day now: Never smoked      Past tobacco use, amount per day: None      How many years did you use tobacco:      Alcohol use (drinks per week):      Diet:      Do you drink/eat things with caffeine? 2 cups of coffee every day      Marital status: Widowed             What year were you married? 1960 and 1983      Do you live in a house, apartment, assisted living, condo, trailer? Apartment      Is it one or more stories? One      How many persons live in your home? Just her      Do you have any pets in your home? 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 Yes            How often? Walking and chair exercises daily      Do you have a living will? Yes      Do you have a DNR form? Yes           If not, do you want to discuss one?      Do you have signed POA/HPOA forms? Yes                  Family History  Problem Relation Age of Onset  . Stroke Mother   . Heart disease Father     Health Maintenance  Topic Date Due  . TETANUS/TDAP  10/28/1954  . DEXA SCAN  10/27/2000  . INFLUENZA VACCINE  08/12/2016  . PNA vac Low Risk Adult (2 of 2 - PCV13) 01/12/2017    Allergies  Allergen Reactions  . Codeine     Nausea    Outpatient Encounter Prescriptions as of 09/08/2016  Medication Sig  . amoxicillin (AMOXIL) 500 MG capsule Take by mouth 3 (three) times daily. Take 4 capsules 1 hour prior to dental appointment  . aspirin EC 81 MG tablet Take 81 mg by mouth daily.  Marland Kitchen atorvastatin (LIPITOR) 80 MG tablet Take 80 mg  by mouth daily.  . Cholecalciferol 1000 units capsule Take 2,000 Units by mouth daily.  . enalapril (VASOTEC) 5 MG tablet Take 1 tablet (5 mg total) by mouth daily.  . fexofenadine (ALLEGRA) 180 MG tablet Take 180 mg by mouth daily.  . metoprolol (TOPROL-XL) 200 MG 24 hr tablet Take 200 mg by mouth daily.  . nitroGLYCERIN (NITROSTAT) 0.4 MG SL tablet Place 0.4 mg under the tongue every 5  (five) minutes as needed for chest pain.  Marland Kitchen omeprazole (PRILOSEC) 20 MG capsule Take 20 mg by mouth at bedtime.  . [DISCONTINUED] diazepam (VALIUM) 5 MG tablet Take 5 mg by mouth. Take 1 tablet 2 hours prior to MRI. May repeat in 1 hour if still anxious.  . [DISCONTINUED] enalapril (VASOTEC) 5 MG tablet Take 5 mg by mouth daily.  . [DISCONTINUED] meloxicam (MOBIC) 7.5 MG tablet Take 7.5 mg by mouth daily as needed for pain.  . [DISCONTINUED] polyethylene glycol (MIRALAX / GLYCOLAX) packet Take 17 g by mouth 3 (three) times daily with meals.  . [DISCONTINUED] promethazine (PHENERGAN) 12.5 MG tablet Take 12.5 mg by mouth every 6 (six) hours as needed for nausea or vomiting.  . [DISCONTINUED] traMADol (ULTRAM) 50 MG tablet Take 25 mg by mouth every 6 (six) hours as needed.  Marland Kitchen acetaminophen (TYLENOL) 500 MG tablet Take 1 tablet (500 mg total) by mouth every 6 (six) hours as needed for moderate pain.  . fluticasone (FLONASE) 50 MCG/ACT nasal spray Place 1 spray into both nostrils daily.  . Wheat Dextrin (BENEFIBER) POWD 2 spoonful mixed in water 3 times a day  . [DISCONTINUED] atorvastatin (LIPITOR) 10 MG tablet Take 10 mg by mouth daily.  . [DISCONTINUED] Calcium Carb-Cholecalciferol (CALCIUM 600 + D) 600-200 MG-UNIT TABS Take 1 tablet by mouth daily.  . [DISCONTINUED] metoprolol tartrate (LOPRESSOR) 100 MG tablet Take 100 mg by mouth 2 (two) times daily.  . [DISCONTINUED] omeprazole (PRILOSEC) 20 MG capsule Take 20 mg by mouth daily.   No facility-administered encounter medications on file as of 09/08/2016.     Review of Systems  Constitutional: Negative for appetite change, chills and fever.  HENT: Positive for rhinorrhea. Negative for congestion, hearing loss, mouth sores, sore throat and trouble swallowing.   Eyes: Positive for visual disturbance.       Has corrective glasses  Respiratory: Negative for cough and shortness of breath.   Cardiovascular: Negative for chest pain, palpitations and  leg swelling.  Gastrointestinal: Positive for constipation. Negative for abdominal pain, blood in stool, diarrhea, nausea and vomiting.  Genitourinary: Negative for dysuria, flank pain and hematuria.  Allergic/Immunologic: Positive for environmental allergies.  Neurological: Negative for dizziness, syncope, light-headedness, numbness and headaches.  Hematological: Does not bruise/bleed easily.  Psychiatric/Behavioral: Positive for dysphoric mood. Negative for sleep disturbance. The patient is nervous/anxious.     Vitals:   09/08/16 1055  BP: 132/64  Pulse: 62  Resp: 18  Temp: 98 F (36.7 C)  TempSrc: Oral  SpO2: 94%  Weight: 182 lb (82.6 kg)  Height: _0  (1.702 m)   Body mass index is 28.51 kg/m. Physical Exam  Constitutional: She is oriented to person, place, and time.  Overweight elderly female patient in no acute distress  HENT:  Head: Normocephalic and atraumatic.  Right Ear: External ear normal.  Left Ear: External ear normal.  Nose: Nose normal.  Mouth/Throat: Oropharynx is clear and moist. No oropharyngeal exudate.  Some cerumen to both ears, can visualize tympanic membrane  Eyes: Pupils are equal, round,  and reactive to light. Conjunctivae and EOM are normal. Right eye exhibits no discharge. Left eye exhibits no discharge. No scleral icterus.  Has corrective lenses  Neck: Normal range of motion. Neck supple. No JVD present. No tracheal deviation present. No thyromegaly present.  Cardiovascular: Normal rate and regular rhythm.   Pulmonary/Chest: Effort normal and breath sounds normal. She has no wheezes. She has no rales. She exhibits no tenderness.  Abdominal: Soft. Bowel sounds are normal. There is no tenderness. There is no rebound and no guarding.  Musculoskeletal: Normal range of motion. She exhibits edema.  Edema around her ankle, can move all 4 extremities  Lymphadenopathy:    She has no cervical adenopathy.  Neurological: She is alert and oriented to  person, place, and time.  Skin: Skin is warm and dry. No rash noted. She is not diaphoretic. No erythema.  Dry skin  Psychiatric: She has a normal mood and affect. Her behavior is normal.    Labs reviewed: none for review.  Basic Metabolic Panel: No results for input(s): NA, K, CL, CO2, GLUCOSE, BUN, CREATININE, CALCIUM, MG, PHOS in the last 8760 hours. Liver Function Tests: No results for input(s): AST, ALT, ALKPHOS, BILITOT, PROT, ALBUMIN in the last 8760 hours. No results for input(s): LIPASE, AMYLASE in the last 8760 hours. No results for input(s): AMMONIA in the last 8760 hours. CBC: No results for input(s): WBC, NEUTROABS, HGB, HCT, MCV, PLT in the last 8760 hours. Cardiac Enzymes: No results for input(s): CKTOTAL, CKMB, CKMBINDEX, TROPONINI in the last 8760 hours. BNP: Invalid input(s): POCBNP No results found for: HGBA1C No results found for: TSH No results found for: VITAMINB12 No results found for: FOLATE No results found for: IRON, TIBC, FERRITIN  Lipid Panel: No results for input(s): CHOL, HDL, LDLCALC, TRIG, CHOLHDL, LDLDIRECT in the last 8760 hours. No results found for: HGBA1C  Procedures since last visit: No results found.  Assessment/Plan  1. Essential hypertension Stable, continue enalapril and metoprolol - TSH; Future - CBC with Differential/Platelets; Future - CMP with eGFR; Future  2. Coronary artery disease of native artery of native heart with stable angina pectoris (HCC) Chest pain free. Continue aspirin EC 81 mg daily, atorvastatin 80 mg daily, metoprolol 200 mg daily and enalapril 5 mg daily. Will need records from cardiology office - CBC with Differential/Platelets; Future - CMP with eGFR; Future - Lipid Panel; Future  3. History of breast cancer in female S/p right mastectomy, obtain prior records and schedule mammogram for next year  4. OSA on CPAP Continue CPAP  5. Constipation, unspecified constipation type Continue benefiber,  maintain hydration - TSH; Future  6. Gastroesophageal reflux disease, esophagitis presence not specified Controlled symptom, continue omeprazole 20 mg daily.   7. Allergic rhinitis, unspecified seasonality, unspecified trigger Continue allegra, change flonase to 1 spray daily for now and prn on next visit.   8. Spinal stenosis lumbar Denies claudication. Denies pain. Monitor clinically   Labs/tests ordered:  As above  Next appointment: 3 month with MMSE, PHQ9  Communication:  I spent 60 minutes in total face-to-face time with the patient, more than 50% of which was spent in counseling and coordination of care, reviewing medication and discussing or reviewing the diagnosis with patient. Will need her prior medical records from cardiology, dermatology and PCP office for review. Medical release form signed.      Blanchie Serve, MD Internal Medicine [facility] [address] [place], [place] [address] Cell Phone (Monday-Friday 8  am - 5 pm): [phone] On Call: [phone] and follow prompts after 5 pm and on weekends Office Phone: [phone] Office Fax: [phone]

## 2016-09-11 ENCOUNTER — Encounter: Payer: Self-pay | Admitting: Internal Medicine

## 2016-09-15 DIAGNOSIS — E1159 Type 2 diabetes mellitus with other circulatory complications: Secondary | ICD-10-CM | POA: Diagnosis not present

## 2016-09-15 DIAGNOSIS — M79672 Pain in left foot: Secondary | ICD-10-CM | POA: Diagnosis not present

## 2016-09-15 DIAGNOSIS — M79671 Pain in right foot: Secondary | ICD-10-CM | POA: Diagnosis not present

## 2016-09-15 DIAGNOSIS — L84 Corns and callosities: Secondary | ICD-10-CM | POA: Diagnosis not present

## 2016-09-15 DIAGNOSIS — Q6689 Other  specified congenital deformities of feet: Secondary | ICD-10-CM | POA: Diagnosis not present

## 2016-10-09 NOTE — Progress Notes (Signed)
Echocardiogram Report 12/25/14 Impression. Normal left ventricular size and systolic function.no left ventricular wall motion and abnormalities. LVEF(normal): 50-55%. Mild, grade 1 diastolic dysfunction. No hemodynamically significant valvular disease. Estimated PA systolic  Pressure is normal. Estimated PA systolic pressure is normal. Compared to the prior study dated 05/05/2013, there is no significant change.

## 2016-10-13 ENCOUNTER — Encounter: Payer: Medicare Other | Admitting: Internal Medicine

## 2016-10-13 ENCOUNTER — Telehealth: Payer: Self-pay | Admitting: Internal Medicine

## 2016-10-13 NOTE — Telephone Encounter (Signed)
Called pt regarding NO Show n/a.  Left message..Stephanie Shea

## 2016-10-20 ENCOUNTER — Non-Acute Institutional Stay: Payer: Medicare Other | Admitting: Internal Medicine

## 2016-10-20 ENCOUNTER — Encounter: Payer: Self-pay | Admitting: Internal Medicine

## 2016-10-20 VITALS — BP 130/70 | HR 62 | Temp 98.1°F | Resp 16 | Ht 68.0 in | Wt 179.2 lb

## 2016-10-20 DIAGNOSIS — M79645 Pain in left finger(s): Secondary | ICD-10-CM

## 2016-10-20 DIAGNOSIS — M159 Polyosteoarthritis, unspecified: Secondary | ICD-10-CM | POA: Diagnosis not present

## 2016-10-20 DIAGNOSIS — I25118 Atherosclerotic heart disease of native coronary artery with other forms of angina pectoris: Secondary | ICD-10-CM

## 2016-10-20 MED ORDER — MELOXICAM 7.5 MG PO TABS: 7.5000 mg | ORAL_TABLET | Freq: Every day | ORAL | 3 refills | Status: DC

## 2016-10-20 NOTE — Progress Notes (Signed)
Stephanie Shea  Provider: Blanchie Serve MD   Location:  [place] of Service:  [place] (12)  PCP: Blanchie Serve, MD Patient Care Team: Blanchie Serve, MD as PCP - General (Internal Medicine) Mast, [name], NP as Nurse Practitioner (Internal Medicine)  Extended Emergency Contact Information Primary Emergency Contact: YSAYTK,ZSWF L Address: [place],  [phone] Home Phone: [phone] Relation: None  Code Status: DNR  Goals of Care: Advanced Directive information Advanced Directives 09/08/2016  Does Patient Have a Medical Advance Directive? Yes  Type of Paramedic of [place];Living will;Out of facility DNR (pink MOST or yellow form)  Copy of [place] in Chart? No - copy requested  Pre-existing out of facility DNR order (yellow form or pink MOST form) Yellow form placed in chart (order not valid for inpatient use)      Chief Complaint  Patient presents with  . Acute Visit    left ring fingerr pain and spasm  . Medication Refill    No refills needed at this time.     HPI: Patient is a 83 y.o. female seen today for acute visit for pain to left ring finger. The pain has been there on and off for 2-3 years. Doing cardio drumming recently at [facility] has started the pain to her left ring finger. She also feels muscle spasm at times and now her whole left hand gets painful post cardio-drumming. Denies any known trauma or injury. The pain/ tightness resolves after an hour. She does not need to take a pain pill yet. She mentions having median nerve release in both hands for carpal tunnel syndrome. She has run out of meloxicam and needs refills.                                                               Past Medical History:  Diagnosis Date  . Breast cancer ([place])   . Heart attack ([place])   . High cholesterol   . Hypertension   . Skin cancer   . Sleep apnea    Past  Surgical History:  Procedure Laterality Date  . ABDOMINAL HYSTERECTOMY    . bone density    . CARDIAC CATHETERIZATION    . COLONOSCOPY    . DIAGNOSTIC MAMMOGRAM    . left knee replacement    . MASTECTOMY    . pap smear    . tonsilectomy      reports that she has never smoked. She has never used smokeless tobacco. She reports that she does not drink alcohol or use drugs. Social History   Social History  . Marital status: Widowed    Spouse name: N/A  . Number of children: N/A  . Years of education: N/A   Occupational History  . Not on file.   Social History Main Topics  . Smoking status: Never Smoker  . Smokeless tobacco: Never Used  . Alcohol use No  . Drug use: No  . Sexual activity: Not on file   Other Topics Concern  . Not on file   Social History Narrative   Tobacco use, amount per day now: Never smoked  Past tobacco use, amount per day: None      How many years did you use tobacco:      Alcohol use (drinks per week):      Diet:      Do you drink/eat things with caffeine? 2 cups of coffee every day      Marital status: Widowed             What year were you married? 1960 and 1983      Do you live in a house, apartment, assisted living, condo, trailer? Apartment      Is it one or more stories? One      How many persons live in your home? Just her      Do you have any pets in your home? 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 Yes            How often? Walking and chair exercises daily      Do you have a living will? Yes      Do you have a DNR form? Yes           If not, do you want to discuss one?      Do you have signed POA/HPOA forms? Yes                  Family History  Problem Relation Age of Onset  . Stroke Mother   . Heart disease Father     Health Maintenance  Topic Date Due  . TETANUS/TDAP  10/28/1954  . DEXA SCAN  10/27/2000  . INFLUENZA VACCINE  10/21/2016 (Originally 08/12/2016)  . PNA vac  Low Risk Adult (2 of 2 - PCV13) 01/12/2017    Allergies  Allergen Reactions  . Codeine     Nausea    Outpatient Encounter Prescriptions as of 10/20/2016  Medication Sig  . acetaminophen (TYLENOL) 500 MG tablet Take 1 tablet (500 mg total) by mouth every 6 (six) hours as needed for moderate pain.  Marland Kitchen amoxicillin (AMOXIL) 500 MG capsule Take by mouth 3 (three) times daily. Take 4 capsules 1 hour prior to dental appointment  . aspirin EC 81 MG tablet Take 81 mg by mouth daily.  Marland Kitchen atorvastatin (LIPITOR) 80 MG tablet Take 80 mg by mouth daily.  . Cholecalciferol 1000 units capsule Take 2,000 Units by mouth daily.  . enalapril (VASOTEC) 5 MG tablet Take 1 tablet (5 mg total) by mouth daily.  . fexofenadine (ALLEGRA) 180 MG tablet Take 180 mg by mouth daily.  . fluticasone (FLONASE) 50 MCG/ACT nasal spray Place 1 spray into both nostrils daily.  . metoprolol (TOPROL-XL) 200 MG 24 hr tablet Take 200 mg by mouth daily.  . nitroGLYCERIN (NITROSTAT) 0.4 MG SL tablet Place 0.4 mg under the tongue every 5 (five) minutes as needed for chest pain.  Marland Kitchen omeprazole (PRILOSEC) 20 MG capsule Take 20 mg by mouth at bedtime.  . Wheat Dextrin (BENEFIBER) POWD 2 spoonful mixed in water 3 times a day   No facility-administered encounter medications on file as of 10/20/2016.     Review of Systems  Constitutional: Negative for chills and fever.  HENT: Negative for congestion.   Eyes: Positive for visual disturbance.       Has corrective glasses  Respiratory: Negative for cough and shortness of breath.   Cardiovascular: Negative for chest pain and palpitations.  Musculoskeletal: Positive for arthralgias and  gait problem.       Uses cane.  Skin: Negative for rash.  Allergic/Immunologic: Positive for environmental allergies.  Neurological: Negative for dizziness, syncope, weakness, light-headedness and numbness.  Hematological: Does not bruise/bleed easily.    Vitals:   10/20/16 1205  BP: 130/70  Pulse:  62  Resp: 16  Temp: 98.1 F (36.7 C)  TempSrc: Oral  SpO2: 96%  Weight: 179 lb 3.2 oz (81.3 kg)  Height: 5\' 8"  (1.727 m)   Body mass index is 27.25 kg/m. Physical Exam  Constitutional: She is oriented to person, place, and time.  Overweight elderly female patient in no acute distress  HENT:  Head: Normocephalic and atraumatic.  Mouth/Throat: Oropharynx is clear and moist.  Eyes: Pupils are equal, round, and reactive to light. Conjunctivae and EOM are normal. No scleral icterus.  Has corrective lenses  Neck: Normal range of motion. Neck supple.  Cardiovascular: Normal rate and regular rhythm.   Pulmonary/Chest: Effort normal and breath sounds normal.  Abdominal: Soft.  Musculoskeletal: Normal range of motion. She exhibits edema.  Arthritis changes to fingers. Good ROM to MCP, DIP and PIP. No swelling of joints. Non tender. No redness. Good radial pulse  Neurological: She is alert and oriented to person, place, and time.  Skin: Skin is warm and dry. No rash noted. She is not diaphoretic. No erythema.  Dry skin  Psychiatric: She has a normal mood and affect. Her behavior is normal.    Labs reviewed: none for review.  Basic Metabolic Panel: No results for input(s): NA, K, CL, CO2, GLUCOSE, BUN, CREATININE, CALCIUM, MG, PHOS in the last 8760 hours. Liver Function Tests: No results for input(s): AST, ALT, ALKPHOS, BILITOT, PROT, ALBUMIN in the last 8760 hours. No results for input(s): LIPASE, AMYLASE in the last 8760 hours. No results for input(s): AMMONIA in the last 8760 hours. CBC: No results for input(s): WBC, NEUTROABS, HGB, HCT, MCV, PLT in the last 8760 hours. Cardiac Enzymes: No results for input(s): CKTOTAL, CKMB, CKMBINDEX, TROPONINI in the last 8760 hours. BNP: Invalid input(s): POCBNP No results found for: HGBA1C No results found for: TSH No results found for: VITAMINB12 No results found for: FOLATE No results found for: IRON, TIBC, FERRITIN  Lipid Panel: No  results for input(s): CHOL, HDL, LDLCALC, TRIG, CHOLHDL, LDLDIRECT in the last 8760 hours. No results found for: HGBA1C  Procedures since last visit: No results found.  Assessment/Plan  1. Pain in finger of left hand Likely from OA with her having finger flexed for long duration during drumming. Advised to do stretching exercise and take tylenol prior to exercise to see if it will help. No signs of infection. No signs of neuropathy.   2. Osteoarthritis of multiple joints, unspecified osteoarthritis type Resume her meloxicam and monitor. Exercise as tolerated. Has lost weight.  Wt Readings from Last 3 Encounters:  10/20/16 179 lb 3.2 oz (81.3 kg)  09/08/16 182 lb (82.6 kg)   - meloxicam (MOBIC) 7.5 MG tablet; Take 1 tablet (7.5 mg total) by mouth daily.  Dispense: 90 tablet; Refill: 3   Labs/tests ordered:  none  Next appointment: has follow up appointment   Blanchie Serve, MD Internal Medicine [place], [place] [address] Cell Phone (Monday-Friday 8 am - 5 pm): [phone] On Call: [phone] and follow prompts after 5 pm and on weekends Office Phone: [phone] Office Fax: [phone]

## 2016-10-21 DIAGNOSIS — Z23 Encounter for immunization: Secondary | ICD-10-CM | POA: Diagnosis not present

## 2016-10-27 ENCOUNTER — Encounter: Payer: Self-pay | Admitting: Nurse Practitioner

## 2016-11-12 DIAGNOSIS — R278 Other lack of coordination: Secondary | ICD-10-CM | POA: Diagnosis not present

## 2016-11-12 DIAGNOSIS — R41841 Cognitive communication deficit: Secondary | ICD-10-CM | POA: Diagnosis not present

## 2016-11-12 DIAGNOSIS — R531 Weakness: Secondary | ICD-10-CM | POA: Diagnosis not present

## 2016-11-12 DIAGNOSIS — R600 Localized edema: Secondary | ICD-10-CM | POA: Diagnosis not present

## 2016-11-12 DIAGNOSIS — R29898 Other symptoms and signs involving the musculoskeletal system: Secondary | ICD-10-CM | POA: Diagnosis not present

## 2016-11-13 DIAGNOSIS — R600 Localized edema: Secondary | ICD-10-CM | POA: Diagnosis not present

## 2016-11-13 DIAGNOSIS — R29898 Other symptoms and signs involving the musculoskeletal system: Secondary | ICD-10-CM | POA: Diagnosis not present

## 2016-11-13 DIAGNOSIS — R531 Weakness: Secondary | ICD-10-CM | POA: Diagnosis not present

## 2016-11-13 DIAGNOSIS — R41841 Cognitive communication deficit: Secondary | ICD-10-CM | POA: Diagnosis not present

## 2016-11-13 DIAGNOSIS — R278 Other lack of coordination: Secondary | ICD-10-CM | POA: Diagnosis not present

## 2016-11-16 ENCOUNTER — Other Ambulatory Visit: Payer: Self-pay | Admitting: *Deleted

## 2016-11-16 DIAGNOSIS — I10 Essential (primary) hypertension: Secondary | ICD-10-CM

## 2016-11-16 DIAGNOSIS — K5900 Constipation, unspecified: Secondary | ICD-10-CM

## 2016-11-16 DIAGNOSIS — I2510 Atherosclerotic heart disease of native coronary artery without angina pectoris: Secondary | ICD-10-CM

## 2016-11-17 DIAGNOSIS — B351 Tinea unguium: Secondary | ICD-10-CM | POA: Diagnosis not present

## 2016-11-17 DIAGNOSIS — I10 Essential (primary) hypertension: Secondary | ICD-10-CM | POA: Diagnosis not present

## 2016-11-17 DIAGNOSIS — I2510 Atherosclerotic heart disease of native coronary artery without angina pectoris: Secondary | ICD-10-CM | POA: Diagnosis not present

## 2016-11-17 DIAGNOSIS — L84 Corns and callosities: Secondary | ICD-10-CM | POA: Diagnosis not present

## 2016-11-17 DIAGNOSIS — E1159 Type 2 diabetes mellitus with other circulatory complications: Secondary | ICD-10-CM | POA: Diagnosis not present

## 2016-11-17 DIAGNOSIS — K5900 Constipation, unspecified: Secondary | ICD-10-CM | POA: Diagnosis not present

## 2016-11-17 LAB — CBC WITH DIFFERENTIAL/PLATELET
Basophils Absolute: 13 cells/uL (ref 0–200)
Basophils Relative: 0.2 %
Eosinophils Absolute: 128 cells/uL (ref 15–500)
Eosinophils Relative: 2 %
HCT: 38.3 % (ref 35.0–45.0)
Hemoglobin: 13.1 g/dL (ref 11.7–15.5)
Lymphs Abs: 2746 cells/uL (ref 850–3900)
MCH: 31.8 pg (ref 27.0–33.0)
MCHC: 34.2 g/dL (ref 32.0–36.0)
MCV: 93 fL (ref 80.0–100.0)
MPV: 10.3 fL (ref 7.5–12.5)
Monocytes Relative: 8.4 %
Neutro Abs: 2976 cells/uL (ref 1500–7800)
Neutrophils Relative %: 46.5 %
Platelets: 221 10*3/uL (ref 140–400)
RBC: 4.12 10*6/uL (ref 3.80–5.10)
RDW: 13.7 % (ref 11.0–15.0)
Total Lymphocyte: 42.9 %
WBC mixed population: 538 cells/uL (ref 200–950)
WBC: 6.4 10*3/uL (ref 3.8–10.8)

## 2016-11-17 LAB — LIPID PANEL
Cholesterol: 167 mg/dL (ref <200)
HDL: 53 mg/dL (ref >50)
LDL Cholesterol (Calc): 83 mg/dL (calc)
Non-HDL Cholesterol (Calc): 114 mg/dL (calc) (ref <130)
Total CHOL/HDL Ratio: 3.2 (calc) (ref <5.0)
Triglycerides: 212 mg/dL — ABNORMAL HIGH (ref <150)

## 2016-11-17 LAB — COMPLETE METABOLIC PANEL WITH GFR
AG Ratio: 1.5 (calc) (ref 1.0–2.5)
ALT: 22 U/L (ref 6–29)
AST: 20 U/L (ref 10–35)
Albumin: 4.1 g/dL (ref 3.6–5.1)
Alkaline phosphatase (APISO): 38 U/L (ref 33–130)
BUN: 18 mg/dL (ref 7–25)
CO2: 28 mmol/L (ref 20–32)
Calcium: 9.7 mg/dL (ref 8.6–10.4)
Chloride: 103 mmol/L (ref 98–110)
Creat: 0.72 mg/dL (ref 0.60–0.88)
GFR, Est African American: 91 mL/min/{1.73_m2} (ref >=60)
GFR, Est Non African American: 79 mL/min/{1.73_m2} (ref >=60)
Globulin: 2.8 g/dL (calc) (ref 1.9–3.7)
Glucose, Bld: 116 mg/dL — ABNORMAL HIGH (ref 65–99)
Potassium: 4.3 mmol/L (ref 3.5–5.3)
Sodium: 139 mmol/L (ref 135–146)
Total Bilirubin: 0.7 mg/dL (ref 0.2–1.2)
Total Protein: 6.9 g/dL (ref 6.1–8.1)

## 2016-11-17 LAB — TSH: TSH: 1.72 mIU/L (ref 0.40–4.50)

## 2016-11-18 DIAGNOSIS — R278 Other lack of coordination: Secondary | ICD-10-CM | POA: Diagnosis not present

## 2016-11-18 DIAGNOSIS — R531 Weakness: Secondary | ICD-10-CM | POA: Diagnosis not present

## 2016-11-18 DIAGNOSIS — R41841 Cognitive communication deficit: Secondary | ICD-10-CM | POA: Diagnosis not present

## 2016-11-18 DIAGNOSIS — R29898 Other symptoms and signs involving the musculoskeletal system: Secondary | ICD-10-CM | POA: Diagnosis not present

## 2016-11-18 DIAGNOSIS — R600 Localized edema: Secondary | ICD-10-CM | POA: Diagnosis not present

## 2016-11-20 DIAGNOSIS — R531 Weakness: Secondary | ICD-10-CM | POA: Diagnosis not present

## 2016-11-20 DIAGNOSIS — R29898 Other symptoms and signs involving the musculoskeletal system: Secondary | ICD-10-CM | POA: Diagnosis not present

## 2016-11-20 DIAGNOSIS — R600 Localized edema: Secondary | ICD-10-CM | POA: Diagnosis not present

## 2016-11-20 DIAGNOSIS — R278 Other lack of coordination: Secondary | ICD-10-CM | POA: Diagnosis not present

## 2016-11-20 DIAGNOSIS — R41841 Cognitive communication deficit: Secondary | ICD-10-CM | POA: Diagnosis not present

## 2016-11-24 ENCOUNTER — Encounter: Payer: Self-pay | Admitting: Internal Medicine

## 2016-11-25 DIAGNOSIS — R600 Localized edema: Secondary | ICD-10-CM | POA: Diagnosis not present

## 2016-11-25 DIAGNOSIS — R29898 Other symptoms and signs involving the musculoskeletal system: Secondary | ICD-10-CM | POA: Diagnosis not present

## 2016-11-25 DIAGNOSIS — R278 Other lack of coordination: Secondary | ICD-10-CM | POA: Diagnosis not present

## 2016-11-25 DIAGNOSIS — R41841 Cognitive communication deficit: Secondary | ICD-10-CM | POA: Diagnosis not present

## 2016-11-25 DIAGNOSIS — R531 Weakness: Secondary | ICD-10-CM | POA: Diagnosis not present

## 2016-11-27 DIAGNOSIS — R531 Weakness: Secondary | ICD-10-CM | POA: Diagnosis not present

## 2016-11-27 DIAGNOSIS — R278 Other lack of coordination: Secondary | ICD-10-CM | POA: Diagnosis not present

## 2016-11-27 DIAGNOSIS — R600 Localized edema: Secondary | ICD-10-CM | POA: Diagnosis not present

## 2016-11-27 DIAGNOSIS — R41841 Cognitive communication deficit: Secondary | ICD-10-CM | POA: Diagnosis not present

## 2016-11-27 DIAGNOSIS — R29898 Other symptoms and signs involving the musculoskeletal system: Secondary | ICD-10-CM | POA: Diagnosis not present

## 2016-12-01 ENCOUNTER — Non-Acute Institutional Stay: Payer: Medicare Other | Admitting: Internal Medicine

## 2016-12-01 ENCOUNTER — Encounter: Payer: Self-pay | Admitting: Internal Medicine

## 2016-12-01 VITALS — BP 132/74 | HR 60 | Temp 97.4°F | Resp 16 | Ht 67.0 in | Wt 177.4 lb

## 2016-12-01 DIAGNOSIS — I25118 Atherosclerotic heart disease of native coronary artery with other forms of angina pectoris: Secondary | ICD-10-CM

## 2016-12-01 DIAGNOSIS — E782 Mixed hyperlipidemia: Secondary | ICD-10-CM | POA: Diagnosis not present

## 2016-12-01 DIAGNOSIS — J309 Allergic rhinitis, unspecified: Secondary | ICD-10-CM | POA: Diagnosis not present

## 2016-12-01 DIAGNOSIS — M159 Polyosteoarthritis, unspecified: Secondary | ICD-10-CM

## 2016-12-01 DIAGNOSIS — K439 Ventral hernia without obstruction or gangrene: Secondary | ICD-10-CM

## 2016-12-01 DIAGNOSIS — K219 Gastro-esophageal reflux disease without esophagitis: Secondary | ICD-10-CM | POA: Diagnosis not present

## 2016-12-01 DIAGNOSIS — I209 Angina pectoris, unspecified: Secondary | ICD-10-CM | POA: Diagnosis not present

## 2016-12-01 DIAGNOSIS — R739 Hyperglycemia, unspecified: Secondary | ICD-10-CM | POA: Diagnosis not present

## 2016-12-01 DIAGNOSIS — I10 Essential (primary) hypertension: Secondary | ICD-10-CM | POA: Diagnosis not present

## 2016-12-01 DIAGNOSIS — E785 Hyperlipidemia, unspecified: Secondary | ICD-10-CM | POA: Insufficient documentation

## 2016-12-01 NOTE — Progress Notes (Signed)
[facility]  Provider: Blanchie Serve MD   Location:  [place] of Service:  Clinic (12)  PCP: Blanchie Serve, MD Patient Care Team: Blanchie Serve, MD as PCP - General (Internal Medicine) Mast, [name], NP as Nurse Practitioner (Internal Medicine)  Extended Emergency Contact Information Primary Emergency Contact: NWGNFA,OZHY L Address: [place],  [phone] Home Phone: [phone] Relation: None  Code Status: DNR  Goals of Care: Advanced Directive information Advanced Directives 09/08/2016  Does Patient Have a Medical Advance Directive? Yes  Type of Paramedic of [place];Living will;Out of facility DNR (pink MOST or yellow form)  Copy of [place] in Chart? No - copy requested  Pre-existing out of facility DNR order (yellow form or pink MOST form) Yellow form placed in chart (order not valid for inpatient use)     Chief Complaint  Patient presents with  . Medical Management of Chronic Issues    3 month follow up. Patient stated that she has a lump on her stomach that she noticed a month ago, non-tender.   . Medication Refill    No refills needed at this time.   Marland Kitchen Results    Discuss labs  . MMSE    30/30, passed the clock drawing     HPI: Patient is a 83 y.o. female seen today for routine visit. She has noticed a lump around her stomach area. Denies any pain or discomfort.   CAD- s/p CABG. Chest pain free. Currently on aspirin 81 mg daily, atorvastatin 80 mg daily, enalapril 5 mg daily, metoprolol succinate 200 mg daily and prn NTG. Participates in group exercise classes- does cardio drumming. Needs to establish care with cardiology in town.   gerd- takes omeprazole 20 mg daily. Has lost 5 lbs. Has occasional heartburn and reflux symptom. Medication helps. Did not tolerate changing omeprazole to daily prn.   Allergic rhinitis- currently on flonase and  fexofenadine. Has been helpful.   OA- takes tylenol, meloxicam and vitamin d supplement. Medication helps.   Hyperlipidemia- reviewed recent lipid panel. Currently on atorvatstain 80 mg daily.   Past Medical History:  Diagnosis Date  . Atherosclerosis of native coronary artery   . Breast cancer ([place])   . Cervix dysplasia   . GERD (gastroesophageal reflux disease)   . Heart attack ([place])   . High cholesterol   . Hypertension   . Lumbosacral spinal stenosis   . Osteoarthritis   . Skin cancer   . Sleep apnea    Past Surgical History:  Procedure Laterality Date  . ABDOMINAL HYSTERECTOMY    . bone density    . CARDIAC CATHETERIZATION    . COLONOSCOPY    . DIAGNOSTIC MAMMOGRAM    . left knee replacement    . MASTECTOMY    . pap smear    . tonsilectomy      reports that  has never smoked. she has never used smokeless tobacco. She reports that she does not drink alcohol or use drugs. Social History   Socioeconomic History  . Marital status: Widowed    Spouse name: Not on file  . Number of children: Not on file  . Years of education: Not on file  . Highest education level: Not on file  Social Needs  . Financial resource strain: Not on file  . Food insecurity - worry: Not on file  .  Food insecurity - inability: Not on file  . Transportation needs - medical: Not on file  . Transportation needs - non-medical: Not on file  Occupational History  . Not on file  Tobacco Use  . Smoking status: Never Smoker  . Smokeless tobacco: Never Used  Substance and Sexual Activity  . Alcohol use: No  . Drug use: No  . Sexual activity: Not on file  Other Topics Concern  . Not on file  Social History Narrative   Tobacco use, amount per day now: Never smoked      Past tobacco use, amount per day: None      How many years did you use tobacco:      Alcohol use (drinks per week):      Diet:      Do you drink/eat things with caffeine? 2 cups of coffee every day      Marital status:  Widowed             What year were you married? 1960 and 1983      Do you live in a house, apartment, assisted living, condo, trailer? Apartment      Is it one or more stories? One      How many persons live in your home? Just her      Do you have any pets in your home? 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 Yes            How often? Walking and chair exercises daily      Do you have a living will? Yes      Do you have a DNR form? Yes           If not, do you want to discuss one?      Do you have signed POA/HPOA forms? Yes              Functional Status Survey:    Family History  Problem Relation Age of Onset  . Stroke Mother   . Heart disease Father     Health Maintenance  Topic Date Due  . DEXA SCAN  12/01/2017 (Originally 10/27/2000)  . TETANUS/TDAP  12/01/2017 (Originally 10/28/1952)  . PNA vac Low Risk Adult (2 of 2 - PCV13) 01/12/2017  . INFLUENZA VACCINE  Completed    Allergies  Allergen Reactions  . Codeine Nausea Only    Outpatient Encounter Medications as of 12/01/2016  Medication Sig  . acetaminophen (TYLENOL) 500 MG tablet Take 500 mg by mouth at bedtime as needed.  Marland Kitchen amoxicillin (AMOXIL) 500 MG capsule Take by mouth 3 (three) times daily. Take 4 capsules 1 hour prior to dental appointment  . aspirin EC 81 MG tablet Take 81 mg by mouth daily.  Marland Kitchen atorvastatin (LIPITOR) 80 MG tablet Take 80 mg by mouth daily.  . Cholecalciferol 1000 units capsule Take 2,000 Units by mouth daily.  . enalapril (VASOTEC) 5 MG tablet Take 1 tablet (5 mg total) by mouth daily.  . meloxicam (MOBIC) 7.5 MG tablet Take 1 tablet (7.5 mg total) by mouth daily.  . metoprolol (TOPROL-XL) 200 MG 24 hr tablet Take 200 mg by mouth daily.  . nitroGLYCERIN (NITROSTAT) 0.4 MG SL tablet Place 0.4 mg under the tongue every 5 (five) minutes as needed for chest pain.  Marland Kitchen omeprazole (PRILOSEC) 20 MG capsule Take 20 mg by mouth at bedtime.  . Wheat Dextrin  (BENEFIBER)  POWD 2 spoonful mixed in water 3 times a day  . fexofenadine (ALLEGRA) 180 MG tablet Take 180 mg by mouth daily.  . fluticasone (FLONASE) 50 MCG/ACT nasal spray Place 1 spray into both nostrils daily. (Patient not taking: Reported on 12/01/2016)  . [DISCONTINUED] acetaminophen (TYLENOL) 500 MG tablet Take 1 tablet (500 mg total) by mouth every 6 (six) hours as needed for moderate pain. (Patient taking differently: Take 500 mg by mouth at bedtime as needed for moderate pain. )   No facility-administered encounter medications on file as of 12/01/2016.     Review of Systems  Constitutional: Negative for appetite change, chills and fever.  HENT: Positive for congestion and rhinorrhea. Negative for ear pain, mouth sores, nosebleeds, sinus pain, sore throat and trouble swallowing.   Eyes: Positive for visual disturbance.  Respiratory: Negative for cough and shortness of breath.   Cardiovascular: Negative for chest pain, palpitations and leg swelling.  Gastrointestinal: Negative for abdominal pain, blood in stool, constipation, nausea and vomiting.  Genitourinary: Negative for dysuria.  Musculoskeletal: Positive for arthralgias and gait problem. Negative for back pain.       No fall reported, uses a cane  Skin: Negative for rash and wound.  Neurological: Negative for dizziness and headaches.  Hematological: Does not bruise/bleed easily.  Psychiatric/Behavioral: Negative for behavioral problems and confusion.    Vitals:   12/01/16 1104  BP: 132/74  Pulse: 60  Resp: 16  Temp: (!) 97.4 F (36.3 C)  TempSrc: Oral  SpO2: 94%  Weight: 177 lb 6.4 oz (80.5 kg)  Height: 5\' 7"  (1.702 m)   Body mass index is 27.78 kg/m.   Wt Readings from Last 3 Encounters:  12/01/16 177 lb 6.4 oz (80.5 kg)  10/20/16 179 lb 3.2 oz (81.3 kg)  09/08/16 182 lb (82.6 kg)   Physical Exam  Constitutional: She is oriented to person, place, and time. She appears well-developed and well-nourished. No  distress.  HENT:  Head: Normocephalic and atraumatic.  Mouth/Throat: Oropharynx is clear and moist.  Eyes: Conjunctivae and EOM are normal. Pupils are equal, round, and reactive to light.  Neck: Normal range of motion. Neck supple.  Cardiovascular: Normal rate and regular rhythm.  Murmur heard. Pulmonary/Chest: Effort normal and breath sounds normal. No respiratory distress. She has no wheezes. She has no rales.  Abdominal: Soft. Bowel sounds are normal. She exhibits no distension. There is no tenderness. There is no guarding.  Abdominal wall hernia +, non tender  Musculoskeletal: She exhibits edema.  Can move all 4 ext, uses cane, arthritis changes, hair loss to legs, dry skin  Lymphadenopathy:    She has no cervical adenopathy.  Neurological: She is alert and oriented to person, place, and time.  12/01/16 MMSE 30/30, passed clock draw  Skin: Skin is warm and dry. She is not diaphoretic. No erythema.  Psychiatric: She has a normal mood and affect. Her behavior is normal.    Labs reviewed: Basic Metabolic Panel: Recent Labs    03/03/16 11/17/16 0725  NA 145 139  K 4.3 4.3  CL  --  103  CO2  --  28  GLUCOSE  --  116*  BUN 14 18  CREATININE 0.8 0.72  CALCIUM  --  9.7   Liver Function Tests: Recent Labs    03/03/16 11/17/16 0725  AST 22 20  ALT 21 22  ALKPHOS 45  --   BILITOT  --  0.7  PROT  --  6.9   No results  for input(s): LIPASE, AMYLASE in the last 8760 hours. No results for input(s): AMMONIA in the last 8760 hours. CBC: Recent Labs    03/03/16 11/17/16 0725  WBC 4.6 6.4  NEUTROABS  --  2,976  HGB 13.0 13.1  HCT 39 38.3  MCV  --  93.0  PLT 242 221   Cardiac Enzymes: No results for input(s): CKTOTAL, CKMB, CKMBINDEX, TROPONINI in the last 8760 hours. BNP: Invalid input(s): POCBNP No results found for: HGBA1C Lab Results  Component Value Date   TSH 1.72 11/17/2016   No results found for: VITAMINB12 No results found for: FOLATE No results found for:  IRON, TIBC, FERRITIN  Lipid Panel: Recent Labs    03/03/16 11/17/16 0725  CHOL 159 167  HDL 49 53  LDLCALC 36  --   TRIG 182* 212*  CHOLHDL  --  3.2   No results found for: HGBA1C  Procedures since last visit: No results found.  Assessment/Plan  1. Hyperglycemia Cut down on sweets and fried food. Literature on diet and exercise provided.  - Hemoglobin A1c; Future  2. Abdominal wall hernia Reducible, non tender, monitor clinically.   3. Coronary artery disease of native artery of native heart with stable angina pectoris ([place]) Continue current regimen of b blocker and ACEI with aspirin and statin.  - Ambulatory referral to Cardiology - Hemoglobin A1c; Future - CMP; Future - Lipid Panel; Future - CBC (no diff); Future  4. Essential hypertension Controlled BP reading. Continue enalapril and metoprolol.   5. Allergic rhinitis, unspecified seasonality, unspecified trigger Continue flomase and allegra.   6. Gastroesophageal reflux disease, esophagitis presence not specified Change omperazole to 20 mg daily and monitor. Failed prn dosing.   7. Mixed hyperlipidemia Continue atorvastatin, lipid panel reviewed.  - Hemoglobin A1c; Future - CMP; Future - Lipid Panel; Future - CBC (no diff); Future  8. Osteoarthritis of multiple joints, unspecified osteoarthritis type Continue meloxicam and prn tylenol.    Labs/tests ordered:   Lab Orders     Hemoglobin A1c     CMP     Lipid Panel     CBC (no diff) Next appointment: 6 month physical  Communication: reviewed care plan with patient     Blanchie Serve, MD Internal Medicine [place], [place] [address] Cell Phone (Monday-Friday 8 am - 5 pm): [phone] On Call: [phone] and follow prompts after 5 pm and on weekends Office Phone: [phone] Office Fax: [phone]

## 2016-12-01 NOTE — Patient Instructions (Addendum)
Prediabetes Eating Plan Prediabetes-also called impaired glucose tolerance or impaired fasting glucose-is a condition that causes blood sugar (blood glucose) levels to be higher than normal. Following a healthy diet can help to keep prediabetes under control. It can also help to lower the risk of type 2 diabetes and heart disease, which are increased in people who have prediabetes. Along with regular exercise, a healthy diet:  Promotes weight loss.  Helps to control blood sugar levels.  Helps to improve the way that the body uses insulin.  What do I need to know about this eating plan?  Use the glycemic index (GI) to plan your meals. The index tells you how quickly a food will raise your blood sugar. Choose low-GI foods. These foods take a longer time to raise blood sugar.  Pay close attention to the amount of carbohydrates in the food that you eat. Carbohydrates increase blood sugar levels.  Keep track of how many calories you take in. Eating the right amount of calories will help you to achieve a healthy weight. Losing about 7 percent of your starting weight can help to prevent type 2 diabetes.  You may want to follow a Mediterranean diet. This diet includes a lot of vegetables, lean meats or fish, whole grains, fruits, and healthy oils and fats. What foods can I eat? Grains Whole grains, such as whole-wheat or whole-grain breads, crackers, cereals, and pasta. Unsweetened oatmeal. Bulgur. Barley. Quinoa. Brown rice. Corn or whole-wheat flour tortillas or taco shells. Vegetables Lettuce. Spinach. Peas. Beets. Cauliflower. Cabbage. Broccoli. Carrots. Tomatoes. Squash. Eggplant. Herbs. Peppers. Onions. Cucumbers. Brussels sprouts. Fruits Berries. Bananas. Apples. Oranges. Grapes. Papaya. Mango. Pomegranate. Kiwi. Grapefruit. Cherries. Meats and Other Protein Sources Seafood. Lean meats, such as chicken and turkey or lean cuts of pork and beef. Tofu. Eggs. Nuts. Beans. Dairy Low-fat or  fat-free dairy products, such as yogurt, cottage cheese, and cheese. Beverages Water. Tea. Coffee. Sugar-free or diet soda. Seltzer water. Milk. Milk alternatives, such as soy or almond milk. Condiments Mustard. Relish. Low-fat, low-sugar ketchup. Low-fat, low-sugar barbecue sauce. Low-fat or fat-free mayonnaise. Sweets and Desserts Sugar-free or low-fat pudding. Sugar-free or low-fat ice cream and other frozen treats. Fats and Oils Avocado. Walnuts. Olive oil. The items listed above may not be a complete list of recommended foods or beverages. Contact your dietitian for more options. What foods are not recommended? Grains Refined white flour and flour products, such as bread, pasta, snack foods, and cereals. Beverages Sweetened drinks, such as sweet iced tea and soda. Sweets and Desserts Baked goods, such as cake, cupcakes, pastries, cookies, and cheesecake. The items listed above may not be a complete list of foods and beverages to avoid. Contact your dietitian for more information. This information is not intended to replace advice given to you by your health care provider. Make sure you discuss any questions you have with your health care provider. Document Released: 05/15/2014 Document Revised: 06/06/2015 Document Reviewed: 01/24/2014 Elsevier Interactive Patient Education  2017 [employer]  

## 2016-12-02 DIAGNOSIS — R29898 Other symptoms and signs involving the musculoskeletal system: Secondary | ICD-10-CM | POA: Diagnosis not present

## 2016-12-02 DIAGNOSIS — R531 Weakness: Secondary | ICD-10-CM | POA: Diagnosis not present

## 2016-12-02 DIAGNOSIS — R600 Localized edema: Secondary | ICD-10-CM | POA: Diagnosis not present

## 2016-12-02 DIAGNOSIS — R41841 Cognitive communication deficit: Secondary | ICD-10-CM | POA: Diagnosis not present

## 2016-12-02 DIAGNOSIS — R278 Other lack of coordination: Secondary | ICD-10-CM | POA: Diagnosis not present

## 2016-12-09 DIAGNOSIS — R29898 Other symptoms and signs involving the musculoskeletal system: Secondary | ICD-10-CM | POA: Diagnosis not present

## 2016-12-09 DIAGNOSIS — R41841 Cognitive communication deficit: Secondary | ICD-10-CM | POA: Diagnosis not present

## 2016-12-09 DIAGNOSIS — R278 Other lack of coordination: Secondary | ICD-10-CM | POA: Diagnosis not present

## 2016-12-09 DIAGNOSIS — R531 Weakness: Secondary | ICD-10-CM | POA: Diagnosis not present

## 2016-12-09 DIAGNOSIS — R600 Localized edema: Secondary | ICD-10-CM | POA: Diagnosis not present

## 2016-12-17 DIAGNOSIS — M6281 Muscle weakness (generalized): Secondary | ICD-10-CM | POA: Diagnosis not present

## 2016-12-17 DIAGNOSIS — N3946 Mixed incontinence: Secondary | ICD-10-CM | POA: Diagnosis not present

## 2016-12-23 ENCOUNTER — Ambulatory Visit: Payer: Self-pay | Admitting: Cardiology

## 2016-12-24 DIAGNOSIS — M6281 Muscle weakness (generalized): Secondary | ICD-10-CM | POA: Diagnosis not present

## 2016-12-24 DIAGNOSIS — N3946 Mixed incontinence: Secondary | ICD-10-CM | POA: Diagnosis not present

## 2017-01-20 DIAGNOSIS — Z85828 Personal history of other malignant neoplasm of skin: Secondary | ICD-10-CM | POA: Diagnosis not present

## 2017-01-20 DIAGNOSIS — L570 Actinic keratosis: Secondary | ICD-10-CM | POA: Diagnosis not present

## 2017-01-20 DIAGNOSIS — D485 Neoplasm of uncertain behavior of skin: Secondary | ICD-10-CM | POA: Diagnosis not present

## 2017-01-20 DIAGNOSIS — C44519 Basal cell carcinoma of skin of other part of trunk: Secondary | ICD-10-CM | POA: Diagnosis not present

## 2017-01-20 DIAGNOSIS — L720 Epidermal cyst: Secondary | ICD-10-CM | POA: Diagnosis not present

## 2017-01-20 DIAGNOSIS — L821 Other seborrheic keratosis: Secondary | ICD-10-CM | POA: Diagnosis not present

## 2017-01-21 ENCOUNTER — Encounter: Payer: Self-pay | Admitting: Cardiology

## 2017-01-21 ENCOUNTER — Encounter (INDEPENDENT_AMBULATORY_CARE_PROVIDER_SITE_OTHER): Payer: Self-pay

## 2017-01-21 ENCOUNTER — Ambulatory Visit (INDEPENDENT_AMBULATORY_CARE_PROVIDER_SITE_OTHER): Payer: Medicare Other | Admitting: Cardiology

## 2017-01-21 VITALS — BP 112/68 | HR 60 | Ht 67.0 in | Wt 181.6 lb

## 2017-01-21 DIAGNOSIS — E785 Hyperlipidemia, unspecified: Secondary | ICD-10-CM | POA: Diagnosis not present

## 2017-01-21 DIAGNOSIS — I10 Essential (primary) hypertension: Secondary | ICD-10-CM | POA: Diagnosis not present

## 2017-01-21 DIAGNOSIS — G4733 Obstructive sleep apnea (adult) (pediatric): Secondary | ICD-10-CM

## 2017-01-21 DIAGNOSIS — I2510 Atherosclerotic heart disease of native coronary artery without angina pectoris: Secondary | ICD-10-CM

## 2017-01-21 DIAGNOSIS — R739 Hyperglycemia, unspecified: Secondary | ICD-10-CM

## 2017-01-21 DIAGNOSIS — Z9989 Dependence on other enabling machines and devices: Secondary | ICD-10-CM | POA: Diagnosis not present

## 2017-01-21 NOTE — Progress Notes (Signed)
PCP: Blanchie Serve, MD  Former Cardiologist : Dr. Kellie Simmering Address: [address], [place],  [address]  Phone: [phone]  Clinic Note: Chief Complaint  Patient presents with  . New Patient (Initial Visit)    pt denied chest pain, SOB  . Coronary Artery Disease    s/p PCI - CABG & PCI    HPI: Stephanie Shea is a 84 y.o. female who is being seen today to establish new cardiology care at the request of Blanchie Serve, MD. Stephanie Shea has moved back to [facility] after having retired down to [employer]. CAD history dates back to 2001 -- progressive exertional angina (bilateral arm aching. Markedly positive nuclear stress test); was told Stephanie Shea has had 3 MIs.  2001 -- ([place], Dr. Glade Lloyd) Cath-PCI of pLAD & PTCA of ostial D1 (jailed), dLAD PTCA.  PCI of pCx, but unable to open dCx-OM.  IVIS of left main  ? Peri-op MI 2002 --> CABG ([place])  2006 [facility], peri-op MI for TAH -- PCI  Last STRESS TEST in 2018 by former Cardiologist (Dr. Kellie Simmering.  [place], [place] ph# [phone]) - no further action   Stephanie Shea was last seen by Stephanie Shea former cardiologist ~ 6 months ago - no change.  He routinely checked ST every ~1-2 yrs.    Recent Hospitalizations: none  Studies Personally Reviewed - (if available, images/films reviewed: From Epic Chart or Care Everywhere)  February 06, 1999 - [facility]) Cath?PCI: 2 vessel CAD involving LAD-D1 and distal Cx-OM. Appearance of dilated tapered left main with no significant atherosclerosis on IVUS --  BMS PCI of pLAD ([facility] BMS 3.0 mm x 15 mm) & with PTCA of ostial D1, PTCA of dLAD.  BMS PCI pCx [facility] BMS 3.5 mm x 11 mm) - unable to reopen dCx-OM (probable distal Atheroembolic occlusion.  Normal LVEF.  Echocardiogram 12/25/14- normal LVEF and systolic function, EF 71-69%, mild grade 1 diastolic dysfunction  Lexiscan Myoview Stress Test 12/31/14 normal  Interval History: Probably is otherwise stable from a  cardiac standpoint. Stephanie Shea is here to establish cardiology care, having routinely been seen every 6 months or so by Stephanie Shea prior cardiologist. Stephanie Shea has now moved into an Independent Living Apartment at [facility] having recently moved from [facility]. Stephanie Shea is very active and denies any recurrence of Stephanie Shea anginal symptoms which are bilateral arm pain Stephanie Shea only notes having some mild lower extremity/ankle edema.   Cardiac review of symptoms: No chest pain or shortness of breath with rest or exertion. No requirement for nitroglycerin in over a year No PND, orthopnea with mild stable edema.  No palpitations, lightheadedness, dizziness, weakness or syncope/near syncope. No TIA/amaurosis fugax symptoms. No melena, hematochezia, hematuria, or epstaxis. No claudication.  Stephanie Shea routinely uses Stephanie Shea CPAP, thoroughly enjoys it. Stephanie Shea says Stephanie Shea gets great sleep when using it. Stephanie Shea feels well rested afterwards. Has no troubles at night.  ROS: A comprehensive was performed. Review of Systems  Constitutional: Negative for malaise/fatigue.  HENT: Positive for congestion. Negative for nosebleeds and sinus pain.        Related to seasonal allergies  Eyes: Negative.   Respiratory: Negative for cough and wheezing.        Only symptomatic with seasonal allergies  Cardiovascular: Negative for claudication.  Gastrointestinal: Positive for constipation (Usually controlled with diet, occasionally needs stool softener). Negative for blood in stool and melena.  Genitourinary: Negative for hematuria.  Musculoskeletal: Negative for falls and joint pain.  Skin:       Dry scaly skin, no rash  Neurological: Negative for dizziness, focal weakness and loss of consciousness.  Endo/Heme/Allergies: Positive for environmental allergies (Seasonal).  Psychiatric/Behavioral: Negative.   All other systems reviewed and are negative.  I have reviewed and (if needed) personally updated the patient's problem list, medications, allergies, past  medical and surgical history, social and family history.   Past Medical History:  Diagnosis Date  . Atherosclerosis of native coronary artery 2001   LAD & Cx disease -- BMS PCI in 2001; CABG 2002.  PCI in 2006 (per-op TAH)  . Breast cancer ([place])   . Cervix dysplasia   . GERD (gastroesophageal reflux disease)   . Heart attack ([place]) 2002, 2006   Both events reportedly were perioperatively. Stephanie Shea has no recollection. Initial PCI was related to angina symptoms (bilateral arm pain)  . High cholesterol   . Hypertension   . Lumbosacral spinal stenosis   . Osteoarthritis   . Skin cancer   . Sleep apnea    Uses CPAP faithfully    Past Surgical History:  Procedure Laterality Date  . ABDOMINAL HYSTERECTOMY    . bone density    . CARDIAC CATHETERIZATION  2001    Dr. Glade Lloyd [facility]) 2 vessel CAD involving LAD-D1 and distal Cx-OM. Appearance of dilated tapered left main with no significant atherosclerosis on IVUS --  BMS PCI of pLAD ([facility] BMS 3.0 mm x 15 mm) & with PTCA of ostial D1, PTCA of dLAD.  BMS PCI pCx [facility] BMS 3.5 mm x 11 mm) - unable to reopen dCx-OM (probable distal Atheroembolic occlusion.  Normal LVEF.  Marland Kitchen COLONOSCOPY    . CORONARY ARTERY BYPASS GRAFT  2002   Unsure of details [facility], in [facility])  . CORONARY STENT INTERVENTION  2001   ([place]) [facility] BMS PCI pLAD (3.0 mm x 15 mm), pCx (3.5 mm x 11 mm), PTCA of dLAD & ostial D1 (jailed))  . DIAGNOSTIC MAMMOGRAM    . left knee replacement    . MASTECTOMY    . NM MYOVIEW LTD  12/2014   LEXISCAN: Nonischemic, "normal "  . pap smear    . tonsilectomy    . TRANSTHORACIC ECHOCARDIOGRAM  12/2014   Echocardiogram 12/25/14- normal LVEF and systolic function, EF 29-56%, mild grade 1 diastolic dysfunction    Current Meds  Medication Sig  . acetaminophen (TYLENOL) 500 MG tablet Take 500 mg by mouth at bedtime as needed.  Marland Kitchen aspirin EC 81 MG tablet Take 81 mg by mouth daily.  Marland Kitchen atorvastatin (LIPITOR) 80 MG tablet Take  80 mg by mouth daily.  . Cholecalciferol 1000 units capsule Take 2,000 Units by mouth daily.  . enalapril (VASOTEC) 5 MG tablet Take 1 tablet (5 mg total) by mouth daily.  . fexofenadine (ALLEGRA) 180 MG tablet Take 180 mg by mouth daily.  . fluticasone (FLONASE) 50 MCG/ACT nasal spray Place 1 spray into both nostrils daily. (Patient taking differently: Place 1 spray into both nostrils as needed. )  . meloxicam (MOBIC) 7.5 MG tablet Take 1 tablet (7.5 mg total) by mouth daily.  . metoprolol (TOPROL-XL) 200 MG 24 hr tablet Take 200 mg by mouth daily.  . nitroGLYCERIN (NITROSTAT) 0.4 MG SL tablet Place 0.4 mg under the tongue every 5 (five) minutes as needed for chest pain.  Marland Kitchen omeprazole (PRILOSEC) 20 MG capsule Take 20 mg by mouth at bedtime.  . Wheat Dextrin (BENEFIBER) POWD 2 spoonful mixed in water 3  times a day    Allergies  Allergen Reactions  . Codeine Nausea Only    Social History   Tobacco Use  . Smoking status: Never Smoker  . Smokeless tobacco: Never Used  Substance Use Topics  . Alcohol use: No  . Drug use: No   Social History   Social History Narrative   Recently moved back to [place] (lives alone in a [place] apartment at [facility])   Do you drink/eat things with caffeine? 2 cups of coffee every day   What year were you married? Sterling - twice widowed   No pets.      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 Has POA      Primary Emergency Contact: LYYTKP,TWSF L   Address: [place],  [id number]   Home Phone: [phone]   Family History family history includes Heart disease in Stephanie Shea father; Stroke in Stephanie Shea mother.  Wt Readings from Last 3 Encounters:  01/21/17 181 lb 9.6 oz (82.4 kg)  12/01/16 177 lb 6.4 oz (80.5 kg)  10/20/16 179 lb 3.2 oz (81.3 kg)    PHYSICAL EXAM BP 112/68   Pulse 60   Ht 5\' 7"  (1.702 m)    Wt 181 lb 9.6 oz (82.4 kg)   BMI 28.44 kg/m  Physical Exam  Constitutional: Stephanie Shea is oriented to person, place, and time. Stephanie Shea appears well-developed and well-nourished. No distress.  HENT:  Head: Normocephalic and atraumatic.  Mouth/Throat: Oropharynx is clear and moist. No oropharyngeal exudate.  Eyes: Conjunctivae and EOM are normal. Pupils are equal, round, and reactive to light. No scleral icterus.  Neck: Normal range of motion. Neck supple. No hepatojugular reflux and no JVD present. Carotid bruit is not present. No tracheal deviation present. No thyromegaly present.  Cardiovascular: Normal rate, regular rhythm, intact distal pulses and normal pulses.  No extrasystoles are present. PMI is not displaced.  Murmur heard.  Medium-pitched harsh crescendo-decrescendo midsystolic murmur is present with a grade of 1/6 at the upper right sternal border radiating to the neck. Pulmonary/Chest: Effort normal and breath sounds normal. No respiratory distress. Stephanie Shea has no wheezes. Stephanie Shea has no rales.  Abdominal: Soft. Bowel sounds are normal. Stephanie Shea exhibits no distension. There is no tenderness. There is no rebound.  Musculoskeletal: Normal range of motion. Stephanie Shea exhibits no edema.  Neurological: Stephanie Shea is alert and oriented to person, place, and time. No cranial nerve deficit.  Skin: Skin is warm and dry. No rash noted. No erythema.  Somewhat dry and flaky  Psychiatric: Stephanie Shea has a normal mood and affect. Stephanie Shea behavior is normal. Judgment and thought content normal.  Vitals reviewed.    Adult ECG Report  Rate: 60 ;  Rhythm: normal sinus rhythm and Nonspecific Lateral ST and T-wave abnormality. Otherwise normal axis, intervals and durations.;   Narrative Interpretation: Relatively normal/nonspecific EKG  Other studies Reviewed: Additional studies/ records that were reviewed today include:  Recent Labs:   Lab Results  Component Value Date   CREATININE 0.72 11/17/2016   BUN 18 11/17/2016   NA 139 11/17/2016     K 4.3 11/17/2016   CL 103 11/17/2016   CO2 28 11/17/2016   Lab Results  Component Value Date   CHOL 167 11/17/2016   HDL 53 11/17/2016  [place] 36 03/03/2016   TRIG 212 (H) 11/17/2016   CHOLHDL 3.2 11/17/2016    ASSESSMENT / PLAN: Problem List Items Addressed This Visit    Coronary artery disease involving native heart without angina pectoris - Primary (Chronic)    We will try to obtain Stephanie Shea records from various hospitals in [place] and Stephanie Shea former cardiologist. Vita Barley Stephanie Shea last stress test was in 2016, Stephanie Shea is a synthetic, therefore I don't think we need to check another one anytime soon. Would likely be due in 2020. Stephanie Shea is on a standing regimen of aspirin, high-dose statin plus Benefiber along with enalapril and high-dose Toprol. No changes for now.      Relevant Orders   EKG 12-Lead (Completed)   Hemoglobin A1c   Hyperglycemia   Relevant Orders   Hemoglobin A1c   Hyperlipidemia with target low density lipoprotein (LDL) cholesterol less than 70 mg/dL (Chronic)    Unfortunately, did not see that Stephanie Shea had recently had labs checked. I've ordered follow-up labs for lipid panel and LFTs. We can probably cancel these as it would not be due again until this time next year.  Stephanie Shea remains on high-dose atorvastatin and Benefiber. For now we'll just continue current doses as long Stephanie Shea is not noticing any memory issues or myalgias on statin. However low threshold to reduce dose to 40 mg daily.      Relevant Orders   Lipid panel   Comprehensive metabolic panel   Hemoglobin A1c   Hypertension (Chronic)    Well-controlled on enalapril and Toprol.      Relevant Orders   Hemoglobin A1c   OSA on CPAP    Doing well on CPAP.      Relevant Orders   EKG 12-Lead (Completed)      Current medicines are reviewed at length with the patient today. (+/- concerns) n/a The following changes have been made: n/a  Patient Instructions  NO MEDICATION CHANGES    LABS  DO NOT EAT  OR DRINK THE MORNING OF THE TEST LIPID CMP HgbA1C    WILL Ne[facility] , [place] DOCTOR's office    Your physician wants you to follow-up in 6 months with DR Shaunice Levitan. You will receive a reminder letter in the mail two months in advance. If you don't receive a letter, please call our office to schedule the follow-up appointment.   If you need a refill on your cardiac medications before your next appointment, please call your pharmacy.    Studies Ordered:   Orders Placed This Encounter  Procedures  . Lipid panel  . Comprehensive metabolic panel  . Hemoglobin A1c  . EKG 12-Lead    - Will likely not need follow-up labs until after June visit.    Glenetta Hew, M.D., M.S. Interventional Cardiologist   Pager # [phone] Phone # [phone] [address] [place], [place] [address]   Thank you for choosing [facility] at [facility]!!

## 2017-01-21 NOTE — Patient Instructions (Signed)
NO MEDICATION CHANGES    LABS  DO NOT EAT OR DRINK THE MORNING OF THE TEST LIPID CMP HgbA1C    WILL OBTAIN RECORDS FROM  HOSPITAL - [place] , [place] DOCTOR's office    Your physician wants you to follow-up in 6 months with DR HARDING. You will receive a reminder letter in the mail two months in advance. If you don't receive a letter, please call our office to schedule the follow-up appointment.   If you need a refill on your cardiac medications before your next appointment, please call your pharmacy.

## 2017-01-24 ENCOUNTER — Encounter: Payer: Self-pay | Admitting: Cardiology

## 2017-01-24 NOTE — Assessment & Plan Note (Signed)
Doing well on CPAP 

## 2017-01-24 NOTE — Assessment & Plan Note (Signed)
Well-controlled on enalapril and Toprol.

## 2017-01-24 NOTE — Assessment & Plan Note (Signed)
Unfortunately, did not see that she had recently had labs checked. I've ordered follow-up labs for lipid panel and LFTs. We can probably cancel these as it would not be due again until this time next year.  She remains on high-dose atorvastatin and Benefiber. For now we'll just continue current doses as long she is not noticing any memory issues or myalgias on statin. However low threshold to reduce dose to 40 mg daily.

## 2017-01-24 NOTE — Assessment & Plan Note (Signed)
We will try to obtain her records from various hospitals in [place] and her former cardiologist. Stephanie Shea her last stress test was in 2016, she is a synthetic, therefore I don't think we need to check another one anytime soon. Would likely be due in 2020. She is on a standing regimen of aspirin, high-dose statin plus Benefiber along with enalapril and high-dose Toprol. No changes for now.

## 2017-02-02 ENCOUNTER — Other Ambulatory Visit: Payer: Self-pay

## 2017-02-02 DIAGNOSIS — E1065 Type 1 diabetes mellitus with hyperglycemia: Secondary | ICD-10-CM

## 2017-02-08 ENCOUNTER — Telehealth: Payer: Self-pay | Admitting: Cardiology

## 2017-02-08 DIAGNOSIS — R739 Hyperglycemia, unspecified: Secondary | ICD-10-CM

## 2017-02-08 DIAGNOSIS — E785 Hyperlipidemia, unspecified: Secondary | ICD-10-CM

## 2017-02-08 NOTE — Telephone Encounter (Signed)
Follow up    Nurse from [facility] called to provide fax number for lab orders. The lab orders are handled by [facility] which is at the facility. The fax number is [phone].

## 2017-02-08 NOTE — Telephone Encounter (Signed)
Left message for [facility] at [facility] to call with the fax number to fax the orders to.

## 2017-02-08 NOTE — Telephone Encounter (Signed)
Patient calling, states that she would like to have bloodwork completed at [facility].

## 2017-02-08 NOTE — Telephone Encounter (Signed)
Lab orders faxed to the number provided.

## 2017-02-24 DIAGNOSIS — L570 Actinic keratosis: Secondary | ICD-10-CM | POA: Diagnosis not present

## 2017-02-24 DIAGNOSIS — C44519 Basal cell carcinoma of skin of other part of trunk: Secondary | ICD-10-CM | POA: Diagnosis not present

## 2017-02-26 ENCOUNTER — Encounter: Payer: Self-pay | Admitting: Internal Medicine

## 2017-03-12 ENCOUNTER — Non-Acute Institutional Stay: Payer: Medicare Other

## 2017-03-12 VITALS — BP 132/64 | HR 68 | Temp 97.5°F | Ht 67.0 in | Wt 183.0 lb

## 2017-03-12 DIAGNOSIS — Z0000 Encounter for general adult medical examination without abnormal findings: Secondary | ICD-10-CM | POA: Diagnosis not present

## 2017-03-12 NOTE — Patient Instructions (Signed)
[name] , Thank you for taking time to come for your Medicare Wellness Visit. I appreciate your ongoing commitment to your health goals. Please review the following plan we discussed and let me know if I can assist you in the future.   Screening recommendations/referrals: Colonoscopy excluded, you are over age 82 Mammogram excluded, you are over gae 75 Bone Density up to date Recommended yearly ophthalmology/optometry visit for glaucoma screening and checkup Recommended yearly dental visit for hygiene and checkup  Vaccinations: Influenza vaccine up to date, due 2019 fall season Pneumococcal vaccine-please call insurance and ask if you have been previously billed for prevnar 13 or pneumovax 23 Tdap vaccine-please call insurance and ask if you have been previously billed for this tetanus vaccine Shingles vaccine due, prescription sent to pharmacy    Advanced directives: in chart  Conditions/risks identified: none  Next appointment: Dr. Bubba Camp 06/22/2017 @ 10am   Preventive Care 65 Years and Older, Female Preventive care refers to lifestyle choices and visits with your health care provider that can promote health and wellness. What does preventive care include?  A yearly physical exam. This is also called an annual well check.  Dental exams once or twice a year.  Routine eye exams. Ask your health care provider how often you should have your eyes checked.  Personal lifestyle choices, including:  Daily care of your teeth and gums.  Regular physical activity.  Eating a healthy diet.  Avoiding tobacco and drug use.  Limiting alcohol use.  Practicing safe sex.  Taking low-dose aspirin every day.  Taking vitamin and mineral supplements as recommended by your health care provider. What happens during an annual well check? The services and screenings done by your health care provider during your annual well check will depend on your age, overall health, lifestyle risk  factors, and family history of disease. Counseling  Your health care provider may ask you questions about your:  Alcohol use.  Tobacco use.  Drug use.  Emotional well-being.  Home and relationship well-being.  Sexual activity.  Eating habits.  History of falls.  Memory and ability to understand (cognition).  Work and work Statistician.  Reproductive health. Screening  You may have the following tests or measurements:  Height, weight, and BMI.  Blood pressure.  Lipid and cholesterol levels. These may be checked every 5 years, or more frequently if you are over 49 years old.  Skin check.  Lung cancer screening. You may have this screening every year starting at age 41 if you have a 30-pack-year history of smoking and currently smoke or have quit within the past 15 years.  Fecal occult blood test (FOBT) of the stool. You may have this test every year starting at age 84.  Flexible sigmoidoscopy or colonoscopy. You may have a sigmoidoscopy every 5 years or a colonoscopy every 10 years starting at age 67.  Hepatitis C blood test.  Hepatitis B blood test.  Sexually transmitted disease (STD) testing.  Diabetes screening. This is done by checking your blood sugar (glucose) after you have not eaten for a while (fasting). You may have this done every 1-3 years.  Bone density scan. This is done to screen for osteoporosis. You may have this done starting at age 59.  Mammogram. This may be done every 1-2 years. Talk to your health care provider about how often you should have regular mammograms. Talk with your health care provider about your test results, treatment options, and if necessary, the need for more tests.  Vaccines  Your health care provider may recommend certain vaccines, such as:  Influenza vaccine. This is recommended every year.  Tetanus, diphtheria, and acellular pertussis (Tdap, Td) vaccine. You may need a Td booster every 10 years.  Zoster vaccine. You  may need this after age 57.  Pneumococcal 13-valent conjugate (PCV13) vaccine. One dose is recommended after age 95.  Pneumococcal polysaccharide (PPSV23) vaccine. One dose is recommended after age 60. Talk to your health care provider about which screenings and vaccines you need and how often you need them. This information is not intended to replace advice given to you by your health care provider. Make sure you discuss any questions you have with your health care provider. Document Released: 01/25/2015 Document Revised: 09/18/2015 Document Reviewed: 10/30/2014 Elsevier Interactive Patient Education  2017 [place] Prevention in the Home Falls can cause injuries. They can happen to people of all ages. There are many things you can do to make your home safe and to help prevent falls. What can I do on the outside of my home?  Regularly fix the edges of walkways and driveways and fix any cracks.  Remove anything that might make you trip as you walk through a door, such as a raised step or threshold.  Trim any bushes or trees on the path to your home.  Use bright outdoor lighting.  Clear any walking paths of anything that might make someone trip, such as rocks or tools.  Regularly check to see if handrails are loose or broken. Make sure that both sides of any steps have handrails.  Any raised decks and porches should have guardrails on the edges.  Have any leaves, snow, or ice cleared regularly.  Use sand or salt on walking paths during winter.  Clean up any spills in your garage right away. This includes oil or grease spills. What can I do in the bathroom?  Use night lights.  Install grab bars by the toilet and in the tub and shower. Do not use towel bars as grab bars.  Use non-skid mats or decals in the tub or shower.  If you need to sit down in the shower, use a plastic, non-slip stool.  Keep the floor dry. Clean up any water that spills on the floor as soon as  it happens.  Remove soap buildup in the tub or shower regularly.  Attach bath mats securely with double-sided non-slip rug tape.  Do not have throw rugs and other things on the floor that can make you trip. What can I do in the bedroom?  Use night lights.  Make sure that you have a light by your bed that is easy to reach.  Do not use any sheets or blankets that are too big for your bed. They should not hang down onto the floor.  Have a firm chair that has side arms. You can use this for support while you get dressed.  Do not have throw rugs and other things on the floor that can make you trip. What can I do in the kitchen?  Clean up any spills right away.  Avoid walking on wet floors.  Keep items that you use a lot in easy-to-reach places.  If you need to reach something above you, use a strong step stool that has a grab bar.  Keep electrical cords out of the way.  Do not use floor polish or wax that makes floors slippery. If you must use wax, use non-skid floor wax.  Do not have throw rugs and other things on the floor that can make you trip. What can I do with my stairs?  Do not leave any items on the stairs.  Make sure that there are handrails on both sides of the stairs and use them. Fix handrails that are broken or loose. Make sure that handrails are as long as the stairways.  Check any carpeting to make sure that it is firmly attached to the stairs. Fix any carpet that is loose or worn.  Avoid having throw rugs at the top or bottom of the stairs. If you do have throw rugs, attach them to the floor with carpet tape.  Make sure that you have a light switch at the top of the stairs and the bottom of the stairs. If you do not have them, ask someone to add them for you. What else can I do to help prevent falls?  Wear shoes that:  Do not have high heels.  Have rubber bottoms.  Are comfortable and fit you well.  Are closed at the toe. Do not wear sandals.  If  you use a stepladder:  Make sure that it is fully opened. Do not climb a closed stepladder.  Make sure that both sides of the stepladder are locked into place.  Ask someone to hold it for you, if possible.  Clearly mark and make sure that you can see:  Any grab bars or handrails.  First and last steps.  Where the edge of each step is.  Use tools that help you move around (mobility aids) if they are needed. These include:  Canes.  Walkers.  Scooters.  Crutches.  Turn on the lights when you go into a dark area. Replace any light bulbs as soon as they burn out.  Set up your furniture so you have a clear path. Avoid moving your furniture around.  If any of your floors are uneven, fix them.  If there are any pets around you, be aware of where they are.  Review your medicines with your doctor. Some medicines can make you feel dizzy. This can increase your chance of falling. Ask your doctor what other things that you can do to help prevent falls. This information is not intended to replace advice given to you by your health care provider. Make sure you discuss any questions you have with your health care provider. Document Released: 10/25/2008 Document Revised: 06/06/2015 Document Reviewed: 02/02/2014 Elsevier Interactive Patient Education  2017 [employer].

## 2017-03-12 NOTE — Progress Notes (Signed)
Subjective:   Stephanie Shea is a 84 y.o. female who presents for Medicare Annual (Subsequent) preventive examination at [facility]   Last AWV-02/2016    Objective:     Vitals: BP 132/64 (BP Location: Left Arm, Patient Position: Sitting)   Pulse 68   Temp (!) 97.5 F (36.4 C) (Oral)   Ht 5\' 7"  (1.702 m)   Wt 183 lb (83 kg)   SpO2 97%   BMI 28.66 kg/m   Body mass index is 28.66 kg/m.  Advanced Directives 03/12/2017 09/08/2016  Does Patient Have a Medical Advance Directive? Yes Yes  Type of Paramedic of [place];Living will;Out of facility DNR (pink MOST or yellow form)  Does patient want to make changes to medical advance directive? No - Patient declined -  Copy of [place] in Chart? Yes No - copy requested  Pre-existing out of facility DNR order (yellow form or pink MOST form) - Yellow form placed in chart (order not valid for inpatient use)    Tobacco Social History   Tobacco Use  Smoking Status Never Smoker  Smokeless Tobacco Never Used     Counseling given: Not Answered   Clinical Intake:  Pre-visit preparation completed: No  Pain : No/denies pain     Nutritional Risks: None Diabetes: No  How often do you need to have someone help you when you read instructions, pamphlets, or other written materials from your doctor or pharmacy?: 1 - Never What is the last grade level you completed in school?: hygenist school  Interpreter Needed?: No  Information entered by :: Tyson Dense, RN  Past Medical History:  Diagnosis Date  . Atherosclerosis of native coronary artery 2001   LAD & Cx disease -- BMS PCI in 2001; CABG 2002.  PCI in 2006 (per-op TAH)  . Breast cancer ([place])   . Cervix dysplasia   . GERD (gastroesophageal reflux disease)   . Heart attack ([place]) 2002, 2006   Both events reportedly were perioperatively. She has no recollection. Initial PCI was  related to angina symptoms (bilateral arm pain)  . High cholesterol   . Hypertension   . Lumbosacral spinal stenosis   . Osteoarthritis   . Skin cancer   . Sleep apnea    Uses CPAP faithfully   Past Surgical History:  Procedure Laterality Date  . ABDOMINAL HYSTERECTOMY    . bone density    . CARDIAC CATHETERIZATION  2001    Dr. Glade [facility]) 2 vessel CAD involving LAD-D1 and distal Cx-OM. Appearance of dilated tapered left main with no significant atherosclerosis on IVUS --  BMS PCI of pLAD ([facility] BMS 3.0 mm x 15 mm) & with PTCA of ostial D1, PTCA of dLAD.  BMS PCI pCx [facility] BMS 3.5 mm x 11 mm) - unable to reopen dCx-OM (probable distal Atheroembolic occlusion.  Normal LVEF.  Marland Kitchen COLONOSCOPY    . CORONARY ARTERY BYPASS GRAFT  2002   Unsure of details [facility])  . CORONARY STENT INTERVENTION  2001   ([place]) [facility] BMS PCI pLAD (3.0 mm x 15 mm), pCx (3.5 mm x 11 mm), PTCA of dLAD & ostial D1 (jailed))  . DIAGNOSTIC MAMMOGRAM    . left knee replacement    . MASTECTOMY    . NM MYOVIEW LTD  12/2014   LEXISCAN: Nonischemic, "normal "  . pap smear    . tonsilectomy    .  TRANSTHORACIC ECHOCARDIOGRAM  12/2014   Echocardiogram 12/25/14- normal LVEF and systolic function, EF 82-42%, mild grade 1 diastolic dysfunction   Family History  Problem Relation Age of Onset  . Stroke Mother   . Heart disease Father    Social History   Socioeconomic History  . Marital status: Widowed    Spouse name: None  . Number of children: None  . Years of education: None  . Highest education level: None  Social Needs  . Financial resource strain: Not hard at all  . Food insecurity - worry: Never true  . Food insecurity - inability: Never true  . Transportation needs - medical: No  . Transportation needs - non-medical: No  Occupational History  . None  Tobacco Use  . Smoking status: Never Smoker  . Smokeless tobacco: Never Used  Substance and Sexual Activity  .  Alcohol use: No  . Drug use: No  . Sexual activity: Not Currently  Other Topics Concern  . None  Social History Narrative   Recently moved back to [place] (lives alone in a [place] apartment at [facility])   Do you drink/eat things with caffeine? 2 cups of coffee every day   What year were you married? [place] - twice widowed   No pets.      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 Has POA      Primary Emergency Contact: PNTIRW,ERXV L   Address: [address],  [id number]   Home Phone: [phone]    Outpatient Encounter Medications as of 03/12/2017  Medication Sig  . acetaminophen (TYLENOL) 500 MG tablet Take 500 mg by mouth at bedtime as needed.  Marland Kitchen aspirin EC 81 MG tablet Take 81 mg by mouth daily.  Marland Kitchen atorvastatin (LIPITOR) 80 MG tablet Take 80 mg by mouth daily.  . enalapril (VASOTEC) 5 MG tablet Take 1 tablet (5 mg total) by mouth daily.  . fexofenadine (ALLEGRA) 180 MG tablet Take 180 mg by mouth daily.  . fluticasone (FLONASE) 50 MCG/ACT nasal spray Place 1 spray into both nostrils daily. (Patient taking differently: Place 1 spray into both nostrils as needed. )  . meloxicam (MOBIC) 7.5 MG tablet Take 1 tablet (7.5 mg total) by mouth daily.  . metoprolol (TOPROL-XL) 200 MG 24 hr tablet Take 200 mg by mouth daily.  . nitroGLYCERIN (NITROSTAT) 0.4 MG SL tablet Place 0.4 mg under the tongue every 5 (five) minutes as needed for chest pain.  Marland Kitchen omeprazole (PRILOSEC) 20 MG capsule Take 20 mg by mouth at bedtime.  . Wheat Dextrin (BENEFIBER) POWD 2 spoonful mixed in water 3 times a day  . Cholecalciferol 1000 units capsule Take 2,000 Units by mouth daily.   No facility-administered encounter medications on file as of 03/12/2017.     Activities of Daily Living In your present state of health, do you have any difficulty performing the following  activities: 03/12/2017  Hearing? N  Vision? N  Difficulty concentrating or making decisions? Y  Walking or climbing stairs? Y  Dressing or bathing? N  Doing errands, shopping? N  Preparing Food and eating ? N  Using the Toilet? N  In the past six months, have you accidently leaked urine? Y  Do you have problems with loss of bowel control?  N  Managing your Medications? N  Managing your Finances? N  Housekeeping or managing your Housekeeping? N  Some recent data might be hidden    Patient Care Team: Blanchie Serve, MD as PCP - General (Internal Medicine) Leonie Man, MD as PCP - Cardiology (Cardiology) Mast, [name], NP as Nurse Practitioner (Internal Medicine)    Assessment:   This is a routine wellness examination for Cynithia.  Exercise Activities and Dietary recommendations Current Exercise Habits: Structured exercise class, Type of exercise: strength training/weights;stretching, Time (Minutes): 60, Frequency (Times/Week): 7, Weekly Exercise (Minutes/Week): 420, Intensity: Mild, Exercise limited by: None identified  Goals    None      Fall Risk Fall Risk  03/12/2017  Falls in the past year? No   Is the patient's home free of loose throw rugs in walkways, pet beds, electrical cords, etc?   yes      Grab bars in the bathroom? yes      Handrails on the stairs?   yes      Adequate lighting?   yes  Depression Screen PHQ 2/9 Scores 03/12/2017  PHQ - 2 Score 0     Cognitive Function MMSE - Mini Mental State Exam 12/01/2016 12/01/2016  Not completed: - (No Data)  Orientation to time 5 5  Orientation to Place 5 5  Registration 3 3  Attention/ Calculation 5 5  Recall 3 3  Language- name 2 objects 2 2  Language- repeat 1 1  Language- follow 3 step command 3 3  Language- read & follow direction 1 1  Write a sentence 1 1  Copy design 1 1  Total score 30 30        Immunization History  Administered Date(s) Administered  . Influenza, High Dose Seasonal PF 10/21/2016    . Influenza-Unspecified 01/13/2016  . Pneumococcal-Unspecified 01/13/2016    Qualifies for Shingles Vaccine? Yes, educated and prescription sent to pharmacy  Screening Tests Health Maintenance  Topic Date Due  . HEMOGLOBIN A1C  1933-05-27  . FOOT EXAM  10/27/1943  . OPHTHALMOLOGY EXAM  10/27/1943  . PNA vac Low Risk Adult (2 of 2 - PCV13) 01/12/2017  . TETANUS/TDAP  12/01/2017 (Originally 10/28/1952)  . INFLUENZA VACCINE  Completed  . DEXA SCAN  Completed    Cancer Screenings: Lung: Low Dose CT Chest recommended if Age 79-80 years, 30 pack-year currently smoking OR have quit w/in 15years. Patient does not qualify. Breast:  Up to date on Mammogram? Yes   Up to date of Bone Density/Dexa? Yes Colorectal: up to date  Additional Screenings:  Hepatitis C Screening: declined TDAP and PNA vaccines due-pt is going to check with insurance to see last dates Pt is going to find eye doctor in [place]    Plan:  I have personally reviewed and addressed the Medicare Annual Wellness questionnaire and have noted the following in the patient's chart:  A. Medical and social history B. Use of alcohol, tobacco or illicit drugs  C. Current medications and supplements D. Functional ability and status E.  Nutritional status F.  Physical activity G. Advance directives H. List of other physicians I.  Hospitalizations, surgeries, and ER visits in previous 12 months J.  [place] to include hearing, vision, cognitive, depression L. Referrals and appointments - none  In addition, I have reviewed and discussed with patient certain preventive protocols, quality metrics, and best practice recommendations. A written personalized care plan for preventive services as well as general preventive health recommendations were  provided to patient.  See attached scanned questionnaire for additional information.   Signed,   Tyson Dense, RN Nurse Health Advisor  Patient Concerns: None

## 2017-03-17 ENCOUNTER — Other Ambulatory Visit: Payer: Self-pay | Admitting: *Deleted

## 2017-03-17 DIAGNOSIS — R739 Hyperglycemia, unspecified: Secondary | ICD-10-CM | POA: Diagnosis not present

## 2017-03-17 DIAGNOSIS — E782 Mixed hyperlipidemia: Secondary | ICD-10-CM | POA: Diagnosis not present

## 2017-03-17 DIAGNOSIS — I2510 Atherosclerotic heart disease of native coronary artery without angina pectoris: Secondary | ICD-10-CM | POA: Diagnosis not present

## 2017-03-17 DIAGNOSIS — M159 Polyosteoarthritis, unspecified: Secondary | ICD-10-CM

## 2017-03-17 DIAGNOSIS — I10 Essential (primary) hypertension: Secondary | ICD-10-CM | POA: Diagnosis not present

## 2017-03-17 MED ORDER — MELOXICAM 7.5 MG PO TABS: 7.5000 mg | ORAL_TABLET | Freq: Every day | ORAL | 1 refills | Status: DC

## 2017-03-17 MED ORDER — ATORVASTATIN CALCIUM 80 MG PO TABS: 80.0000 mg | ORAL_TABLET | Freq: Every day | ORAL | 1 refills | Status: DC

## 2017-03-17 MED ORDER — METOPROLOL SUCCINATE ER 200 MG PO TB24: 200.0000 mg | ORAL_TABLET | Freq: Every day | ORAL | 1 refills | Status: DC

## 2017-03-17 MED ORDER — ENALAPRIL MALEATE 5 MG PO TABS: 5.0000 mg | ORAL_TABLET | Freq: Every day | ORAL | 1 refills | Status: DC

## 2017-03-17 NOTE — Telephone Encounter (Signed)
[facility] [phone] Fax:[phone]

## 2017-03-18 LAB — COMPREHENSIVE METABOLIC PANEL
ALT: 23 IU/L (ref 0–32)
AST: 25 IU/L (ref 0–40)
Albumin/Globulin Ratio: 1.5 (ref 1.2–2.2)
Albumin: 4.4 g/dL (ref 3.5–4.7)
Alkaline Phosphatase: 54 IU/L (ref 39–117)
BUN/Creatinine Ratio: 23 (ref 12–28)
BUN: 17 mg/dL (ref 8–27)
Bilirubin Total: 0.7 mg/dL (ref 0.0–1.2)
CO2: 24 mmol/L (ref 20–29)
Calcium: 9.8 mg/dL (ref 8.7–10.3)
Chloride: 104 mmol/L (ref 96–106)
Creatinine, Ser: 0.75 mg/dL (ref 0.57–1.00)
GFR calc Af Amer: 86 mL/min/{1.73_m2} (ref >59)
GFR calc non Af Amer: 75 mL/min/{1.73_m2} (ref >59)
Globulin, Total: 3 g/dL (ref 1.5–4.5)
Glucose: 105 mg/dL — ABNORMAL HIGH (ref 65–99)
Potassium: 4.2 mmol/L (ref 3.5–5.2)
Sodium: 142 mmol/L (ref 134–144)
Total Protein: 7.4 g/dL (ref 6.0–8.5)

## 2017-03-18 LAB — LIPID PANEL
Chol/HDL Ratio: 3.4 ratio (ref 0.0–4.4)
Cholesterol, Total: 193 mg/dL (ref 100–199)
HDL: 56 mg/dL (ref >39)
LDL Calculated: 93 mg/dL (ref 0–99)
Triglycerides: 222 mg/dL — ABNORMAL HIGH (ref 0–149)
VLDL Cholesterol Cal: 44 mg/dL — ABNORMAL HIGH (ref 5–40)

## 2017-03-18 LAB — HEMOGLOBIN A1C
Est. average glucose Bld gHb Est-mCnc: 137 mg/dL
Hgb A1c MFr Bld: 6.4 % — ABNORMAL HIGH (ref 4.8–5.6)

## 2017-03-19 ENCOUNTER — Other Ambulatory Visit: Payer: Self-pay

## 2017-03-19 MED ORDER — ZOSTER VAC RECOMB ADJUVANTED 50 MCG/0.5ML IM SUSR: 0.5000 mL | Freq: Once | INTRAMUSCULAR | 1 refills | Status: AC

## 2017-03-19 NOTE — Telephone Encounter (Signed)
Late Entry Patient called yesterday about 4:50pm requesting rx for shingles vaccine.    RX sent to pharmacy, patient aware

## 2017-03-24 DIAGNOSIS — C44519 Basal cell carcinoma of skin of other part of trunk: Secondary | ICD-10-CM | POA: Diagnosis not present

## 2017-03-24 DIAGNOSIS — L821 Other seborrheic keratosis: Secondary | ICD-10-CM | POA: Diagnosis not present

## 2017-04-06 ENCOUNTER — Telehealth: Payer: Self-pay | Admitting: *Deleted

## 2017-04-06 NOTE — Telephone Encounter (Signed)
Patient called and stated that she sat in on an inservice regarding Hearing Specialist that comes there to [facility]. They stated that they had appointments for tomorrow if anyone was interested. Patient stated that she is interested but an order has to come from Dr. Bubba Camp at [facility] for them to see her there. Would like this done so she can be seen tomorrow. Please call patient [phone]

## 2017-04-06 NOTE — Telephone Encounter (Signed)
Script provided.

## 2017-04-06 NOTE — Telephone Encounter (Signed)
Patient called again to get information on the Audiology that did the in-service at [facility] today. She would like a call back to discuss setting up an appointment with them tomorrow. She stated that she would need to send them her information.   Please call patient and verify what information she needs. [phone]

## 2017-04-08 ENCOUNTER — Telehealth: Payer: Self-pay | Admitting: *Deleted

## 2017-04-08 NOTE — Telephone Encounter (Signed)
-----   Message from Leonie Man, MD sent at 03/31/2017  4:14 PM EDT ----- Unfortunately, despite being on high-dose statin and Benefiber, higher than it was last year.  At this point, I think more aggressive management would probably be switching to PCSK9 inhibitor. I will refer her to [facility] to discuss additional options.  Glenetta Hew, MD

## 2017-04-08 NOTE — Telephone Encounter (Signed)
LEFT MESSAGE TO CALL BACK - IN REGARDS TO LABS  AN SCHEDULE APPT WITH [facility]

## 2017-04-10 ENCOUNTER — Other Ambulatory Visit: Payer: Self-pay | Admitting: Internal Medicine

## 2017-04-12 ENCOUNTER — Other Ambulatory Visit: Payer: Self-pay | Admitting: *Deleted

## 2017-04-12 MED ORDER — NITROGLYCERIN 0.4 MG SL SUBL: SUBLINGUAL_TABLET | SUBLINGUAL | 0 refills | Status: DC

## 2017-04-12 NOTE — Telephone Encounter (Signed)
Patient requested rx 

## 2017-04-29 DIAGNOSIS — L821 Other seborrheic keratosis: Secondary | ICD-10-CM | POA: Diagnosis not present

## 2017-04-29 DIAGNOSIS — C44519 Basal cell carcinoma of skin of other part of trunk: Secondary | ICD-10-CM | POA: Diagnosis not present

## 2017-05-13 ENCOUNTER — Telehealth: Payer: Self-pay | Admitting: *Deleted

## 2017-05-13 NOTE — Telephone Encounter (Signed)
Ruth Stout with [facility] [phone] Fax:[phone] dropped off medical clearance for patient to participate in a study being conducted at [facility]. The purpose of the study is to learn more about how focus of attention affects balance in order to reduce fall risk in older adults. Balance training program 2x per week for 12 weeks.  In order for patient to participate in study a medical clearance from provider stating that patient is cognitively able to give consent to participate and physically able to perform the tasks.  Form needs to be faxed back to research team at Fax:[phone]  Placed form in Dr. Pandey's box to review 

## 2017-05-19 DIAGNOSIS — L821 Other seborrheic keratosis: Secondary | ICD-10-CM | POA: Diagnosis not present

## 2017-05-19 DIAGNOSIS — L814 Other melanin hyperpigmentation: Secondary | ICD-10-CM | POA: Diagnosis not present

## 2017-05-19 DIAGNOSIS — I8310 Varicose veins of unspecified lower extremity with inflammation: Secondary | ICD-10-CM | POA: Diagnosis not present

## 2017-05-20 ENCOUNTER — Telehealth: Payer: Self-pay

## 2017-05-20 ENCOUNTER — Encounter: Payer: Self-pay | Admitting: Nurse Practitioner

## 2017-05-20 ENCOUNTER — Non-Acute Institutional Stay: Payer: Medicare Other | Admitting: Nurse Practitioner

## 2017-05-20 DIAGNOSIS — I739 Peripheral vascular disease, unspecified: Secondary | ICD-10-CM | POA: Insufficient documentation

## 2017-05-20 DIAGNOSIS — I25810 Atherosclerosis of coronary artery bypass graft(s) without angina pectoris: Secondary | ICD-10-CM | POA: Diagnosis not present

## 2017-05-20 DIAGNOSIS — R269 Unspecified abnormalities of gait and mobility: Secondary | ICD-10-CM

## 2017-05-20 NOTE — Telephone Encounter (Signed)
[place], called requesting medical clearance form to be faxed to the facility for completion. Form was pulled from Dr.Pandey's in office mailbox and faxed to [phone] as requested.  Notation was made on fax for it to be sent back once completed for scanning, Attention Geri (Medical Records)  Original form was placed back in Dr.Pandey's in office mailbox  S.Chrae B/CMA

## 2017-05-20 NOTE — Progress Notes (Signed)
Location:   Clinic [facility]   Place of Service:  Clinic (12) Provider: Marlana Latus NP  Code Status: DNR Goals of Care: [place] 03/12/2017  Does Patient Have a Medical Advance Directive? Yes  Type of Advance Directive [place]  Does patient want to make changes to medical advance directive? No - Patient declined  Copy of [place] in Chart? Yes  Pre-existing out of facility DNR order (yellow form or pink MOST form) -     Chief Complaint  Patient presents with  . Medical Management of Chronic Issues    (L) ankle red area, throbbing in the midle of the night,    HPI: Patient is a 84 y.o. female seen today for an acute visit for an ares about a golf ball sized reddish colored thickened skin area, stated throbbing pain a couple night ago, better after cold compress. She denied itching, pain, tingling, numbness in BLE. She stated her feet feels cold all the time. Noted varicose veins in BLE, dry rough patches in various sized, hx of skin cancer removal medial right lower leg. Left lower leg has vein harvested for CAGB. Weak pedis Dorsalis pulses.  Hx of CAD, stable on Metoprolol 200mg  daily, Enalapril 5mg  qd, Atorvastatin 80mg  qd, ASA 81mg  qd.   Past Medical History:  Diagnosis Date  . Atherosclerosis of native coronary artery 2001   LAD & Cx disease -- BMS PCI in 2001; CABG 2002.  PCI in 2006 (per-op TAH)  . Breast cancer ([place])   . Cervix dysplasia   . GERD (gastroesophageal reflux disease)   . Heart attack ([place]) 2002, 2006   Both events reportedly were perioperatively. She has no recollection. Initial PCI was related to angina symptoms (bilateral arm pain)  . High cholesterol   . Hypertension   . Lumbosacral spinal stenosis   . Osteoarthritis   . Skin cancer   . Sleep apnea    Uses CPAP faithfully    Past Surgical History:  Procedure Laterality Date  . ABDOMINAL HYSTERECTOMY    . bone density    . CARDIAC CATHETERIZATION   2001    Dr. Glade Lloyd [facility]) 2 vessel CAD involving LAD-D1 and distal Cx-OM. Appearance of dilated tapered left main with no significant atherosclerosis on IVUS --  BMS PCI of pLAD ([facility] BMS 3.0 mm x 15 mm) & with PTCA of ostial D1, PTCA of dLAD.  BMS PCI pCx [facility] BMS 3.5 mm x 11 mm) - unable to reopen dCx-OM (probable distal Atheroembolic occlusion.  Normal LVEF.  Marland Kitchen COLONOSCOPY    . CORONARY ARTERY BYPASS GRAFT  2002   Unsure of details [facility] in [facility])  . CORONARY STENT INTERVENTION  2001   () [facility] BMS PCI pLAD (3.0 mm x 15 mm), pCx (3.5 mm x 11 mm), PTCA of dLAD & ostial D1 (jailed))  . DIAGNOSTIC MAMMOGRAM    . left knee replacement    . MASTECTOMY    . NM MYOVIEW LTD  12/2014   LEXISCAN: Nonischemic, "normal "  . pap smear    . tonsilectomy    . TRANSTHORACIC ECHOCARDIOGRAM  12/2014   Echocardiogram 12/25/14- normal LVEF and systolic function, EF 16-10%, mild grade 1 diastolic dysfunction    Allergies  Allergen Reactions  . Codeine Nausea Only    Allergies as of 05/20/2017      Reactions   Codeine Nausea Only      Medication List  Accurate as of 05/20/17 11:59 PM. Always use your most recent med list.          acetaminophen 500 MG tablet Commonly known as:  TYLENOL Take 500 mg by mouth at bedtime as needed.   aspirin EC 81 MG tablet Take 81 mg by mouth daily.   atorvastatin 80 MG tablet Commonly known as:  LIPITOR Take 1 tablet (80 mg total) by mouth daily.   BENEFIBER Powd 2 spoonful mixed in water 3 times a day   Cholecalciferol 1000 units capsule Take 2,000 Units by mouth daily.   enalapril 5 MG tablet Commonly known as:  VASOTEC Take 1 tablet (5 mg total) by mouth daily.   fexofenadine 180 MG tablet Commonly known as:  ALLEGRA Take 180 mg by mouth daily.   fluticasone 50 MCG/ACT nasal spray Commonly known as:  FLONASE Place 1 spray into both nostrils daily.   meloxicam 7.5 MG tablet Commonly known as:   MOBIC Take 1 tablet (7.5 mg total) by mouth daily.   metoprolol 200 MG 24 hr tablet Commonly known as:  TOPROL-XL Take 1 tablet (200 mg total) by mouth daily.   nitroGLYCERIN 0.4 MG SL tablet Commonly known as:  NITROSTAT PLACE 1 TABLET UNDER TONGUE EVERY 5 MINUTES FOR 3 DOSES FOR CHEST PAIN   omeprazole 20 MG capsule Commonly known as:  PRILOSEC Take 20 mg by mouth at bedtime.       Review of Systems:  Review of Systems  Constitutional: Negative for activity change.  HENT: Negative for hearing loss.   Respiratory: Negative for cough, shortness of breath and wheezing.   Cardiovascular: Negative for chest pain, palpitations and leg swelling.  Musculoskeletal: Positive for gait problem.  Skin: Positive for color change. Negative for wound.       Feel cold feet.   Neurological: Negative for dizziness, speech difficulty, weakness, light-headedness and headaches.  Psychiatric/Behavioral: Negative for agitation, behavioral problems and confusion.    Health Maintenance  Topic Date Due  . FOOT EXAM  10/27/1943  . OPHTHALMOLOGY EXAM  10/27/1943  . PNA vac Low Risk Adult (2 of 2 - PCV13) 01/12/2017  . TETANUS/TDAP  12/01/2017 (Originally 10/28/1952)  . INFLUENZA VACCINE  08/12/2017  . HEMOGLOBIN A1C  09/17/2017  . DEXA SCAN  Completed    Physical Exam: Vitals:   05/20/17 1332  BP: 120/68  Pulse: 68  Resp: 18  Temp: (!) 97.4 F (36.3 C)  SpO2: 95%  Weight: 184 lb 12.8 oz (83.8 kg)  Height: 5\' 7"  (1.702 m)   Body mass index is 28.94 kg/m. Physical Exam  Constitutional: She is oriented to person, place, and time. She appears well-developed and well-nourished.  HENT:  Head: Normocephalic and atraumatic.  Eyes: Pupils are equal, round, and reactive to light. EOM are normal.  Neck: Normal range of motion. Neck supple. No JVD present. No thyromegaly present.  Cardiovascular: Normal rate and regular rhythm.  Murmur heard. Weak pedis dorsalis pulses.   Pulmonary/Chest:  Effort normal and breath sounds normal. She has no wheezes. She has no rales.  Musculoskeletal: She exhibits no edema.  Unsteady gait, cane walking.   Neurological: She is alert and oriented to person, place, and time. No cranial nerve deficit. She exhibits normal muscle tone. Coordination normal.  Skin: Skin is warm and dry. There is erythema.  a golf ball sized reddish colored thickened skin area, stated throbbing pain a couple night ago, better after cold compress. She denied itching, pain, tingling, numbness in BLE.  She stated her feet feels cold all the time. Noted varicose veins in BLE, dry rough patches in various sized, reddish/brownish discoloration BLE.  hx of skin cancer removal medial right lower leg.  Psychiatric: She has a normal mood and affect. Her behavior is normal. Judgment and thought content normal.    Labs reviewed: Basic Metabolic Panel: Recent Labs    11/17/16 0725 03/17/17 0936  NA 139 142  K 4.3 4.2  CL 103 104  CO2 28 24  GLUCOSE 116* 105*  BUN 18 17  CREATININE 0.72 0.75  CALCIUM 9.7 9.8  TSH 1.72  --    Liver Function Tests: Recent Labs    11/17/16 0725 03/17/17 0936  AST 20 25  ALT 22 23  ALKPHOS  --  54  BILITOT 0.7 0.7  PROT 6.9 7.4  ALBUMIN  --  4.4   No results for input(s): LIPASE, AMYLASE in the last 8760 hours. No results for input(s): AMMONIA in the last 8760 hours. CBC: Recent Labs    11/17/16 0725  WBC 6.4  NEUTROABS 2,976  HGB 13.1  HCT 38.3  MCV 93.0  PLT 221   Lipid Panel: Recent Labs    11/17/16 0725 03/17/17 0936  CHOL 167 193  HDL 53 56  LDLCALC 83 93  TRIG 212* 222*  CHOLHDL 3.2 3.4   Lab Results  Component Value Date   HGBA1C 6.4 (H) 03/17/2017    Procedures since last visit: No results found.  Assessment/Plan Insufficiency, arterial, peripheral (HCC) A golf ball sized reddish colored thickened skin area, stated throbbing pain a couple night ago, better after cold compress. She denied itching, pain,  tingling, numbness in BLE. She stated her feet feels cold all the time. Noted varicose veins in BLE, dry rough patches in various sized, hx of skin cancer removal medial right lower leg. Left lower leg has vein harvested for CAGB. Weak pedis Dorsalis pulses. No sign of arterial ulcer, cellulitis, infection, dermatology ruled out dermatology related etiology. The patient declined ABI today. Observe.   Coronary artery disease involving native heart without angina pectoris Hx of CAD, stable on Metoprolol 200mg  daily, Enalapril 5mg  qd, Atorvastatin 80mg  qd, ASA 81mg  qd.    Gait abnormality Lost balance x 1 in the past year, no fall, cane or furniture walking sometimes. She desires to participate balance research at [facility], she is cognitively and physically eligible per criteria.      Labs/tests ordered:  None  Next appt:  3 months  Time spend 25 minutes.

## 2017-05-20 NOTE — Assessment & Plan Note (Signed)
Lost balance x 1 in the past year, no fall, cane or furniture walking sometimes. She desires to participate balance research at [facility], she is cognitively and physically eligible per criteria.

## 2017-05-20 NOTE — Assessment & Plan Note (Signed)
Hx of CAD, stable on Metoprolol 200mg  daily, Enalapril 5mg  qd, Atorvastatin 80mg  qd, ASA 81mg  qd.

## 2017-05-20 NOTE — Assessment & Plan Note (Addendum)
A golf ball sized reddish colored thickened skin area, stated throbbing pain a couple night ago, better after cold compress. She denied itching, pain, tingling, numbness in BLE. She stated her feet feels cold all the time. Noted varicose veins in BLE, dry rough patches in various sized, hx of skin cancer removal medial right lower leg. Left lower leg has vein harvested for CAGB. Weak pedis Dorsalis pulses. No sign of arterial ulcer, cellulitis, infection, dermatology ruled out dermatology related etiology. The patient declined ABI today. Observe.

## 2017-05-25 DIAGNOSIS — E782 Mixed hyperlipidemia: Secondary | ICD-10-CM | POA: Diagnosis not present

## 2017-05-25 DIAGNOSIS — I25118 Atherosclerotic heart disease of native coronary artery with other forms of angina pectoris: Secondary | ICD-10-CM | POA: Diagnosis not present

## 2017-05-25 DIAGNOSIS — E1065 Type 1 diabetes mellitus with hyperglycemia: Secondary | ICD-10-CM | POA: Diagnosis not present

## 2017-05-26 LAB — CBC
HCT: 38.9 % (ref 35.0–45.0)
Hemoglobin: 13.2 g/dL (ref 11.7–15.5)
MCH: 31.9 pg (ref 27.0–33.0)
MCHC: 33.9 g/dL (ref 32.0–36.0)
MCV: 94 fL (ref 80.0–100.0)
MPV: 10.1 fL (ref 7.5–12.5)
Platelets: 225 10*3/uL (ref 140–400)
RBC: 4.14 10*6/uL (ref 3.80–5.10)
RDW: 13.2 % (ref 11.0–15.0)
WBC: 6.3 10*3/uL (ref 3.8–10.8)

## 2017-05-26 LAB — COMPREHENSIVE METABOLIC PANEL
AG Ratio: 1.5 (calc) (ref 1.0–2.5)
ALT: 20 U/L (ref 6–29)
AST: 20 U/L (ref 10–35)
Albumin: 4.4 g/dL (ref 3.6–5.1)
Alkaline phosphatase (APISO): 47 U/L (ref 33–130)
BUN: 16 mg/dL (ref 7–25)
CO2: 29 mmol/L (ref 20–32)
Calcium: 9.9 mg/dL (ref 8.6–10.4)
Chloride: 104 mmol/L (ref 98–110)
Creat: 0.81 mg/dL (ref 0.60–0.88)
Globulin: 2.9 g/dL (calc) (ref 1.9–3.7)
Glucose, Bld: 108 mg/dL — ABNORMAL HIGH (ref 65–99)
Potassium: 4.2 mmol/L (ref 3.5–5.3)
Sodium: 140 mmol/L (ref 135–146)
Total Bilirubin: 1.1 mg/dL (ref 0.2–1.2)
Total Protein: 7.3 g/dL (ref 6.1–8.1)

## 2017-05-26 LAB — HEMOGLOBIN A1C
Hgb A1c MFr Bld: 6.1 % of total Hgb — ABNORMAL HIGH (ref <5.7)
Mean Plasma Glucose: 128 (calc)
eAG (mmol/L): 7.1 (calc)

## 2017-05-26 LAB — LIPID PANEL
Cholesterol: 163 mg/dL (ref <200)
HDL: 55 mg/dL (ref >50)
LDL Cholesterol (Calc): 81 mg/dL (calc)
Non-HDL Cholesterol (Calc): 108 mg/dL (calc) (ref <130)
Total CHOL/HDL Ratio: 3 (calc) (ref <5.0)
Triglycerides: 172 mg/dL — ABNORMAL HIGH (ref <150)

## 2017-06-01 ENCOUNTER — Encounter: Payer: Self-pay | Admitting: Internal Medicine

## 2017-06-14 DIAGNOSIS — Z7389 Other problems related to life management difficulty: Secondary | ICD-10-CM | POA: Diagnosis not present

## 2017-06-14 DIAGNOSIS — R2681 Unsteadiness on feet: Secondary | ICD-10-CM | POA: Diagnosis not present

## 2017-06-22 ENCOUNTER — Non-Acute Institutional Stay: Payer: Medicare Other | Admitting: Internal Medicine

## 2017-06-22 ENCOUNTER — Encounter: Payer: Self-pay | Admitting: Internal Medicine

## 2017-06-22 ENCOUNTER — Encounter: Payer: Medicare Other | Admitting: Internal Medicine

## 2017-06-22 VITALS — BP 124/70 | HR 61 | Temp 98.0°F | Resp 18 | Ht 67.0 in | Wt 191.2 lb

## 2017-06-22 DIAGNOSIS — Z23 Encounter for immunization: Secondary | ICD-10-CM

## 2017-06-22 DIAGNOSIS — R739 Hyperglycemia, unspecified: Secondary | ICD-10-CM | POA: Diagnosis not present

## 2017-06-22 DIAGNOSIS — G4733 Obstructive sleep apnea (adult) (pediatric): Secondary | ICD-10-CM

## 2017-06-22 DIAGNOSIS — Z853 Personal history of malignant neoplasm of breast: Secondary | ICD-10-CM | POA: Diagnosis not present

## 2017-06-22 DIAGNOSIS — I10 Essential (primary) hypertension: Secondary | ICD-10-CM | POA: Diagnosis not present

## 2017-06-22 DIAGNOSIS — K5900 Constipation, unspecified: Secondary | ICD-10-CM | POA: Diagnosis not present

## 2017-06-22 DIAGNOSIS — E785 Hyperlipidemia, unspecified: Secondary | ICD-10-CM

## 2017-06-22 DIAGNOSIS — Z7189 Other specified counseling: Secondary | ICD-10-CM | POA: Insufficient documentation

## 2017-06-22 DIAGNOSIS — E663 Overweight: Secondary | ICD-10-CM

## 2017-06-22 DIAGNOSIS — M159 Polyosteoarthritis, unspecified: Secondary | ICD-10-CM | POA: Diagnosis not present

## 2017-06-22 DIAGNOSIS — K439 Ventral hernia without obstruction or gangrene: Secondary | ICD-10-CM

## 2017-06-22 DIAGNOSIS — I25810 Atherosclerosis of coronary artery bypass graft(s) without angina pectoris: Secondary | ICD-10-CM

## 2017-06-22 DIAGNOSIS — Z0000 Encounter for general adult medical examination without abnormal findings: Secondary | ICD-10-CM

## 2017-06-22 DIAGNOSIS — Z9989 Dependence on other enabling machines and devices: Secondary | ICD-10-CM

## 2017-06-22 DIAGNOSIS — K219 Gastro-esophageal reflux disease without esophagitis: Secondary | ICD-10-CM | POA: Diagnosis not present

## 2017-06-22 DIAGNOSIS — J309 Allergic rhinitis, unspecified: Secondary | ICD-10-CM

## 2017-06-22 MED ORDER — TETANUS-DIPHTH-ACELL PERTUSSIS 5-2-15.5 LF-MCG/0.5 IM SUSP: 0.5000 mL | Freq: Once | INTRAMUSCULAR | 0 refills | Status: AC

## 2017-06-22 MED ORDER — ZOSTER VAC RECOMB ADJUVANTED 50 MCG/0.5ML IM SUSR: 0.5000 mL | Freq: Once | INTRAMUSCULAR | 0 refills | Status: AC

## 2017-06-22 NOTE — ACP (Advance Care Planning) (Signed)
  Goals of care discussion Reviewed goals of care with patient with patient and myself present. Reviewed current documents. Patient has a living will and HCPOA paperwork. copies of it present in chart. Patient has a DNR form filled out today. MOST form reviewed and filled out today. Patient would like to remain a DNR in absence of pulse or breath. Patient would like hospitalization with full intervention if has pulse and/or is breathing. Patient agrees to iv fluids if indicated and antibiotic if indicated. Patient does not want a feeding tube for nutritional support. Form filled out and reviewed and signed. I have spent time from 10:40 am to 10:59 am reviewing goals of care today. All questions from patient and/or family member have been answered to the best of my knowledge.

## 2017-06-22 NOTE — Progress Notes (Signed)
[facility]  Provider: Blanchie Serve MD   Location:  [employer] of Service:  Clinic (12)  PCP: Blanchie Serve, MD Patient Care Team: Blanchie Serve, MD as PCP - General (Internal Medicine) Leonie Man, MD as PCP - Cardiology (Cardiology) Mast, [name], NP as Nurse Practitioner (Internal Medicine)  Extended Emergency Contact Information Primary Emergency Contact: [place] Mobile Phone: [phone] Relation: Daughter  Code Status: DNR  Goals of Care: Advanced Directive information Advanced Directives 06/22/2017  Does Patient Have a Medical Advance Directive? Yes  Type of Paramedic of [place];Living will  Does patient want to make changes to medical advance directive? No - Patient declined  Copy of [place] in Chart? Yes  Pre-existing out of facility DNR order (yellow form or pink MOST form) -      Chief Complaint  Patient presents with  . Annual Exam    Annual Exam, Patient has some concerns about her being a diabetic from her last lab results.   . Medication Refill    No refills needed at this time  . Results    Discuss labs   . Goals of Care    Patient is open to talking about DNR and MOST form.     HPI: Patient is a 84 y.o. female seen today for annual visit  Allergic rhinitis- taking allegra on daily basis, controlled symptom  gerd- takes omeprazole 20 mg qhs, controlled symptom, if she misses a dose, symptom recurs. Also on flonase as needed  OA- mainly of knee, takes meloxicam 7.5 mg daily and tylenol extra strength as needed  Hypertension- currently on metoprolol succinate 200 mg daily and enalapril 5 mg daily. Denies any symptom of elevated BP  OSA- uses CPAP at night, tolerating well, denies dyspnea, resting well at night  CAD- chest pain free, has not required prn NTG. Also on b blcoker, ACEI, statin and aspirin  Past Medical History:  Diagnosis Date  .  Atherosclerosis of native coronary artery 2001   LAD & Cx disease -- BMS PCI in 2001; CABG 2002.  PCI in 2006 (per-op TAH)  . Breast cancer ([place])   . Cervix dysplasia   . GERD (gastroesophageal reflux disease)   . Heart attack ([place]) 2002, 2006   Both events reportedly were perioperatively. She has no recollection. Initial PCI was related to angina symptoms (bilateral arm pain)  . High cholesterol   . Hypertension   . Lumbosacral spinal stenosis   . Osteoarthritis   . Skin cancer   . Sleep apnea    Uses CPAP faithfully   Past Surgical History:  Procedure Laterality Date  . ABDOMINAL HYSTERECTOMY    . bone density    . CARDIAC CATHETERIZATION  2001    Dr. Glade Lloyd [facility]) 2 vessel CAD involving LAD-D1 and distal Cx-OM. Appearance of dilated tapered left main with no significant atherosclerosis on IVUS --  BMS PCI of pLAD ([place] BMS 3.0 mm x 15 mm) & with PTCA of ostial D1, PTCA of dLAD.  BMS PCI pCx [facility] BMS 3.5 mm x 11 mm) - unable to reopen dCx-OM (probable distal Atheroembolic occlusion.  Normal LVEF.  Marland Kitchen COLONOSCOPY    . CORONARY ARTERY BYPASS GRAFT  2002   Unsure of details [facility], in [facility])  . CORONARY STENT INTERVENTION  2001   ([place]) [place] BMS PCI pLAD (3.0 mm x 15 mm), pCx (3.5 mm x 11 mm), PTCA  of dLAD & ostial D1 (jailed))  . DIAGNOSTIC MAMMOGRAM    . left knee replacement    . MASTECTOMY    . NM MYOVIEW LTD  12/2014   LEXISCAN: Nonischemic, "normal "  . pap smear    . tonsilectomy    . TRANSTHORACIC ECHOCARDIOGRAM  12/2014   Echocardiogram 12/25/14- normal LVEF and systolic function, EF 94-50%, mild grade 1 diastolic dysfunction    reports that she has never smoked. She has never used smokeless tobacco. She reports that she does not drink alcohol or use drugs. Social History   Socioeconomic History  . Marital status: Widowed    Spouse name: Not on file  . Number of children: Not on file  . Years of education: Not on file  . Highest education  level: Not on file  Occupational History  . Not on file  Social Needs  . Financial resource strain: Not hard at all  . Food insecurity:    Worry: Never true    Inability: Never true  . Transportation needs:    Medical: No    Non-medical: No  Tobacco Use  . Smoking status: Never Smoker  . Smokeless tobacco: Never Used  Substance and Sexual Activity  . Alcohol use: No  . Drug use: No  . Sexual activity: Not Currently  Lifestyle  . Physical activity:    Days per week: 7 days    Minutes per session: 60 min  . Stress: Only a little  Relationships  . Social connections:    Talks on phone: More than three times a week    Gets together: More than three times a week    Attends religious service: More than 4 times per year    Active member of club or organization: No    Attends meetings of clubs or organizations: Never    Relationship status: Widowed  . Intimate partner violence:    Fear of current or ex partner: No    Emotionally abused: No    Physically abused: No    Forced sexual activity: No  Other Topics Concern  . Not on file  Social History Narrative   Recently moved back to [place] (lives alone in a [place] apartment at [facility])   Do you drink/eat things with caffeine? 2 cups of coffee every day   What year were you married? Winifred - twice widowed   No pets.      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 Has POA      Primary Emergency Contact: TUUEKC,MKLK L   Address: [place],  [phone]   Home Phone: [phone]    Functional Status Survey:    Family History  Problem Relation Age of Onset  . Stroke Mother   . Heart disease Father     Health Maintenance  Topic Date Due  . FOOT EXAM  10/27/1943  . OPHTHALMOLOGY EXAM  10/27/1943  . PNA vac Low Risk Adult (2 of 2 - PCV13) 01/12/2017  . TETANUS/TDAP   12/01/2017 (Originally 10/28/1952)  . INFLUENZA VACCINE  08/12/2017  . HEMOGLOBIN A1C  11/25/2017  . DEXA SCAN  Completed    Allergies  Allergen Reactions  . Codeine Nausea Only    Outpatient Encounter Medications as of 06/22/2017  Medication Sig  . acetaminophen (TYLENOL) 500 MG tablet Take 500 mg by mouth at bedtime as needed.  Marland Kitchen aspirin EC 81 MG tablet Take 81 mg by mouth daily.  Marland Kitchen atorvastatin (LIPITOR) 80 MG tablet Take 1 tablet (80 mg total) by mouth daily.  . Cholecalciferol 1000 units capsule Take 2,000 Units by mouth daily.  . enalapril (VASOTEC) 5 MG tablet Take 1 tablet (5 mg total) by mouth daily.  . fexofenadine (ALLEGRA) 180 MG tablet Take 180 mg by mouth daily.  . fluticasone (FLONASE) 50 MCG/ACT nasal spray Place 1 spray into both nostrils as needed for allergies or rhinitis.  . meloxicam (MOBIC) 7.5 MG tablet Take 1 tablet (7.5 mg total) by mouth daily.  . metoprolol (TOPROL-XL) 200 MG 24 hr tablet Take 1 tablet (200 mg total) by mouth daily.  . nitroGLYCERIN (NITROSTAT) 0.4 MG SL tablet PLACE 1 TABLET UNDER TONGUE EVERY 5 MINUTES FOR 3 DOSES FOR CHEST PAIN  . omeprazole (PRILOSEC) 20 MG capsule Take 20 mg by mouth at bedtime.  . Wheat Dextrin (BENEFIBER) POWD 2 spoonful mixed in water 3 times a day  . [DISCONTINUED] fluticasone (FLONASE) 50 MCG/ACT nasal spray Place 1 spray into both nostrils daily.   No facility-administered encounter medications on file as of 06/22/2017.     Review of Systems  Constitutional: Negative for appetite change, chills, fatigue and fever.  HENT: Positive for rhinorrhea and sneezing. Negative for congestion, ear discharge, ear pain, hearing loss, mouth sores, nosebleeds, postnasal drip, sinus pressure, sinus pain, sore throat, tinnitus, trouble swallowing and voice change.        On allegra on routine basis, this helps her symptoms  Eyes: Positive for visual disturbance. Negative for photophobia, pain, discharge, redness and itching.        Wears corrective glasses  Respiratory: Negative for cough, choking, chest tightness, shortness of breath and wheezing.        Uses CPAP at night  Cardiovascular: Negative for chest pain, palpitations and leg swelling.  Gastrointestinal: Negative for abdominal pain, blood in stool, constipation, diarrhea, nausea, rectal pain and vomiting.       Benefiber and hydration helps with her bowel movement  Genitourinary: Negative for dysuria and hematuria.       Denies nocturia  Musculoskeletal: Positive for arthralgias and gait problem.       Has knee pain (chronic), takes extra strength tylenol 1-2 times a week. Takes mobic on daily basis. No fall reported  Skin: Negative for rash and wound.  Neurological: Negative for dizziness, weakness, numbness and headaches.  Hematological: Does not bruise/bleed easily.  Psychiatric/Behavioral: Negative for behavioral problems, confusion, self-injury, sleep disturbance and suicidal ideas.    Vitals:   06/22/17 0958  BP: 124/70  Pulse: 61  Resp: 18  Temp: 98 F (36.7 C)  TempSrc: Oral  SpO2: 93%  Weight: 191 lb 3.2 oz (86.7 kg)  Height: 5' 7"  (1.702 m)   Body mass index is 29.95 kg/m.   Wt Readings from Last 3 Encounters:  06/22/17 191 lb 3.2 oz (86.7 kg)  05/20/17 184 lb 12.8 oz (83.8 kg)  03/12/17 183 lb (83 kg)   Physical Exam  Constitutional: She is oriented to person, place, and time. No distress.  Overweight elderly female   HENT:  Head: Normocephalic and atraumatic.  Right Ear: External ear normal.  Left Ear: External ear normal.  Nose: Nose normal.  Mouth/Throat: Oropharynx is clear and moist. No oropharyngeal exudate.  Some cerumen to both ear canal  Eyes: Pupils are equal, round, and reactive to light. Conjunctivae and EOM are normal. Right eye exhibits no discharge. Left eye exhibits no discharge.  Neck: Normal range of motion. Neck supple. No thyromegaly present.  Cardiovascular: Normal rate, regular rhythm and intact distal  pulses.  Pulmonary/Chest: Effort normal and breath sounds normal. No respiratory distress. She has no wheezes. She has no rales. She exhibits no tenderness.  S/p right mastectomy  Abdominal: Soft. Bowel sounds are normal. There is no tenderness. There is no rebound and no guarding. A hernia is present.  Musculoskeletal: Normal range of motion. She exhibits edema. She exhibits no tenderness.  Trace leg edema, able to move all 4 extremities, uses cane for ambulation  Lymphadenopathy:    She has no cervical adenopathy.  Neurological: She is alert and oriented to person, place, and time. No cranial nerve deficit or sensory deficit. She exhibits normal muscle tone.  11/2016 MMSE 30/30  Skin: Skin is warm and dry. Capillary refill takes 2 to 3 seconds. She is not diaphoretic.  Callus to her toes  Psychiatric: She has a normal mood and affect. Her behavior is normal.    Labs reviewed: Basic Metabolic Panel: Recent Labs    11/17/16 0725 03/17/17 0936 05/25/17 0000  NA 139 142 140  K 4.3 4.2 4.2  CL 103 104 104  CO2 28 24 29   GLUCOSE 116* 105* 108*  BUN 18 17 16   CREATININE 0.72 0.75 0.81  CALCIUM 9.7 9.8 9.9   Liver Function Tests: Recent Labs    11/17/16 0725 03/17/17 0936 05/25/17 0000  AST 20 25 20   ALT 22 23 20   ALKPHOS  --  54  --   BILITOT 0.7 0.7 1.1  PROT 6.9 7.4 7.3  ALBUMIN  --  4.4  --    No results for input(s): LIPASE, AMYLASE in the last 8760 hours. No results for input(s): AMMONIA in the last 8760 hours. CBC: Recent Labs    11/17/16 0725 05/25/17 0000  WBC 6.4 6.3  NEUTROABS 2,976  --   HGB 13.1 13.2  HCT 38.3 38.9  MCV 93.0 94.0  PLT 221 225   Cardiac Enzymes: No results for input(s): CKTOTAL, CKMB, CKMBINDEX, TROPONINI in the last 8760 hours. BNP: Invalid input(s): POCBNP Lab Results  Component Value Date   HGBA1C 6.1 (H) 05/25/2017   Lab Results  Component Value Date   TSH 1.72 11/17/2016   No results found for: VITAMINB12 No results  found for: FOLATE No results found for: IRON, TIBC, FERRITIN  Lipid Panel: Recent Labs    11/17/16 0725 03/17/17 0936 05/25/17 0000  CHOL 167 193 163  HDL 53 56 55  LDLCALC 83 93 81  TRIG 212* 222* 172*  CHOLHDL 3.2 3.4 3.0   Lab Results  Component Value Date   HGBA1C 6.1 (H) 05/25/2017    Procedures since last visit: No results found.  Assessment/Plan  1. Essential hypertension Continue metoprolol and enalapril, stable BP, continue aspirin, renal function reviewed.  - Lipid Panel; Future - CBC (no diff); Future - TSH; Future  2. Coronary artery disease involving coronary bypass graft of native heart without angina pectoris F/u with Dr Ellyn Hack. Continue aspirin, statin, b blocker and ACEI. Labs reviewed - CMP with eGFR([facility]); Future - Lipid Panel; Future - CBC (no diff); Future  3. Immunization due - Tdap (ADACEL) 05-13-13.5 LF-MCG/0.5 injection; Inject 0.5 mLs into the muscle once for 1 dose.  Dispense: 0.5 mL; Refill: 0 - Zoster Vaccine Adjuvanted [facility])  injection; Inject 0.5 mLs into the muscle once for 1 dose.  Dispense: 0.5 mL; Refill: 0  4. Allergic rhinitis, unspecified seasonality, unspecified trigger C/w allegra and prn flonase  5. OSA on CPAP C/w CPAP  6. Gastroesophageal reflux disease, esophagitis presence not specified Continue omeprazole and monitor  7. Hyperlipidemia with target low density lipoprotein (LDL) cholesterol less than 70 mg/dL Continue atorvastatin - Lipid Panel; Future  8. Osteoarthritis of multiple joints, unspecified osteoarthritis type Continue meloxicam and prn tylenol  9. History of breast cancer in female S/p right breast mastectomy.  10. Abdominal wall hernia No signs of obstruction, no change of size, symptom free, monitor  11. Hyperglycemia DASH diet plan. Exercise. Weight loss encouraged - TSH; Future - Hemoglobin A1c; Future  12. Constipation, unspecified constipation type Continue benefiber and  hydration  13. Annual physical exam the patient was counseled regarding the appropriate use of alcohol, prevention of dental and periodontal disease, diet, regular sustained exercise for at least 30 minutes 5 times per week, routine screening interval for eye exam, the proper use of sunscreen and protective clothing, and recommended schedule for cholesterol, thyroid and diabetes screening. Immunization counselling and required script provided.   14. Goals of care, counseling/discussion Last ACP Note 03/24/2017 to 06/22/2017       ACP (Advance Care Planning) by Blanchie Serve, MD at 06/22/2017 10:00 AM    Date of Service   Author Author Type Status Note Type File Time  06/22/2017 Addend Blanchie Serve, MD Physician Signed ACP (Advance Care Planning) 06/22/2017              Goals of care discussion Reviewed goals of care with patient with patient and myself present. Reviewed current documents. Patient has a living will and HCPOA paperwork. copies of it present in chart. Patient has a DNR form filled out today. MOST form reviewed and filled out today. Patient would like to remain a DNR in absence of pulse or breath. Patient would like hospitalization with full intervention if has pulse and/or is breathing. Patient agrees to iv fluids if indicated and antibiotic if indicated. Patient does not want a feeding tube for nutritional support. Form filled out and reviewed and signed. I have spent time from 10:40 am to 10:59 am reviewing goals of care today. All questions from patient and/or family member have been answered to the best of my knowledge.                 15. Overweight (BMI 25.0-29.9) DASH diet plan reading material provided. Encouraged to exercise for 30 minute 5 days a week.  - TSH; Future - Hemoglobin A1c; Future   Labs/tests ordered:   Lab Orders     CMP with eGFR([facility])     Lipid Panel     CBC (no diff)     TSH     Hemoglobin A1c   Next appointment: 6 months or earlier if  needed  Communication: reviewed care plan with patient     Blanchie Serve, MD Internal Medicine [place],  [address] Cell Phone (Monday-Friday 8 am - 5 pm): [phone] On Call: [phone] and follow prompts after 5 pm and on weekends Office Phone: [phone] Office Fax: [phone]

## 2017-06-22 NOTE — Patient Instructions (Signed)
Script for immunization has been sent to your pharmacy for tetanus and shingles vaccine.  Keep your routine follow up with cardiology.     DASH Eating Plan DASH stands for "Dietary Approaches to Stop Hypertension." The DASH eating plan is a healthy eating plan that has been shown to reduce high blood pressure (hypertension). It may also reduce your risk for type 2 diabetes, heart disease, and stroke. The DASH eating plan may also help with weight loss. What are tips for following this plan? General guidelines  Avoid eating more than 2,300 mg (milligrams) of salt (sodium) a day. If you have hypertension, you may need to reduce your sodium intake to 1,500 mg a day.  Limit alcohol intake to no more than 1 drink a day for nonpregnant women and 2 drinks a day for men. One drink equals 12 oz of beer, 5 oz of wine, or 1 oz of hard liquor.  Work with your health care provider to maintain a healthy body weight or to lose weight. Ask what an ideal weight is for you.  Get at least 30 minutes of exercise that causes your heart to beat faster (aerobic exercise) most days of the week. Activities may include walking, swimming, or biking.  Work with your health care provider or diet and nutrition specialist (dietitian) to adjust your eating plan to your individual calorie needs. Reading food labels  Check food labels for the amount of sodium per serving. Choose foods with less than 5 percent of the Daily Value of sodium. Generally, foods with less than 300 mg of sodium per serving fit into this eating plan.  To find whole grains, look for the word "whole" as the first word in the ingredient list. Shopping  Buy products labeled as "low-sodium" or "no salt added."  Buy fresh foods. Avoid canned foods and premade or frozen meals. Cooking  Avoid adding salt when cooking. Use salt-free seasonings or herbs instead of table salt or sea salt. Check with your health care provider or pharmacist before using  salt substitutes.  Do not fry foods. Cook foods using healthy methods such as baking, boiling, grilling, and broiling instead.  Cook with heart-healthy oils, such as olive, canola, soybean, or sunflower oil. Meal planning   Eat a balanced diet that includes: ? 5 or more servings of fruits and vegetables each day. At each meal, try to fill half of your plate with fruits and vegetables. ? Up to 6-8 servings of whole grains each day. ? Less than 6 oz of lean meat, poultry, or fish each day. A 3-oz serving of meat is about the same size as a deck of cards. One egg equals 1 oz. ? 2 servings of low-fat dairy each day. ? A serving of nuts, seeds, or beans 5 times each week. ? Heart-healthy fats. Healthy fats called Omega-3 fatty acids are found in foods such as flaxseeds and coldwater fish, like sardines, salmon, and mackerel.  Limit how much you eat of the following: ? Canned or prepackaged foods. ? Food that is high in trans fat, such as fried foods. ? Food that is high in saturated fat, such as fatty meat. ? Sweets, desserts, sugary drinks, and other foods with added sugar. ? Full-fat dairy products.  Do not salt foods before eating.  Try to eat at least 2 vegetarian meals each week.  Eat more home-cooked food and less restaurant, buffet, and fast food.  When eating at a restaurant, ask that your food be prepared with  less salt or no salt, if possible. What foods are recommended? The items listed may not be a complete list. Talk with your dietitian about what dietary choices are best for you. Grains Whole-grain or whole-wheat bread. Whole-grain or whole-wheat pasta. Brown rice. [place]. Bulgur. Whole-grain and low-sodium cereals. Pita bread. Low-fat, low-sodium crackers. Whole-wheat flour tortillas. Vegetables Fresh or frozen vegetables (raw, steamed, roasted, or grilled). Low-sodium or reduced-sodium tomato and vegetable juice. Low-sodium or reduced-sodium tomato sauce and  tomato paste. Low-sodium or reduced-sodium canned vegetables. Fruits All fresh, dried, or frozen fruit. Canned fruit in natural juice (without added sugar). Meat and other protein foods Skinless chicken or [place]. Ground chicken or [place]. Pork with fat trimmed off. Fish and seafood. Egg whites. Dried beans, peas, or lentils. Unsalted nuts, nut butters, and seeds. Unsalted canned beans. Lean cuts of beef with fat trimmed off. Low-sodium, lean deli meat. Dairy Low-fat (1%) or fat-free (skim) milk. Fat-free, low-fat, or reduced-fat cheeses. Nonfat, low-sodium ricotta or cottage cheese. Low-fat or nonfat yogurt. Low-fat, low-sodium cheese. Fats and oils Soft margarine without trans fats. Vegetable oil. Low-fat, reduced-fat, or light mayonnaise and salad dressings (reduced-sodium). Canola, safflower, olive, soybean, and sunflower oils. Avocado. Seasoning and other foods Herbs. Spices. Seasoning mixes without salt. Unsalted popcorn and pretzels. Fat-free sweets. What foods are not recommended? The items listed may not be a complete list. Talk with your dietitian about what dietary choices are best for you. Grains Baked goods made with fat, such as croissants, muffins, or some breads. Dry pasta or rice meal packs. Vegetables Creamed or fried vegetables. Vegetables in a cheese sauce. Regular canned vegetables (not low-sodium or reduced-sodium). Regular canned tomato sauce and paste (not low-sodium or reduced-sodium). Regular tomato and vegetable juice (not low-sodium or reduced-sodium). Angie Fava. Olives. Fruits Canned fruit in a light or heavy syrup. Fried fruit. Fruit in cream or butter sauce. Meat and other protein foods Fatty cuts of meat. Ribs. Fried meat. Berniece Salines. Sausage. Bologna and other processed lunch meats. Salami. Fatback. Hotdogs. Bratwurst. Salted nuts and seeds. Canned beans with added salt. Canned or smoked fish. Whole eggs or egg yolks. Chicken or [place] with skin. Dairy Whole or 2%  milk, cream, and half-and-half. Whole or full-fat cream cheese. Whole-fat or sweetened yogurt. Full-fat cheese. Nondairy creamers. Whipped toppings. Processed cheese and cheese spreads. Fats and oils Butter. Stick margarine. Lard. Shortening. Ghee. Bacon fat. Tropical oils, such as coconut, palm kernel, or palm oil. Seasoning and other foods Salted popcorn and pretzels. Onion salt, garlic salt, seasoned salt, table salt, and sea salt. Worcestershire sauce. Tartar sauce. Barbecue sauce. Teriyaki sauce. Soy sauce, including reduced-sodium. Steak sauce. Canned and packaged gravies. Fish sauce. Oyster sauce. Cocktail sauce. Horseradish that you find on the shelf. Ketchup. Mustard. Meat flavorings and tenderizers. Bouillon cubes. Hot sauce and Tabasco sauce. Premade or packaged marinades. Premade or packaged taco seasonings. Relishes. Regular salad dressings. Where to find more information:  National Heart, Lung, and [place]: https://wilson-eaton.com/  American Heart Association: www.heart.org Summary  The DASH eating plan is a healthy eating plan that has been shown to reduce high blood pressure (hypertension). It may also reduce your risk for type 2 diabetes, heart disease, and stroke.  With the DASH eating plan, you should limit salt (sodium) intake to 2,300 mg a day. If you have hypertension, you may need to reduce your sodium intake to 1,500 mg a day.  When on the DASH eating plan, aim to eat more fresh fruits and vegetables, whole grains, lean proteins,  low-fat dairy, and heart-healthy fats.  Work with your health care provider or diet and nutrition specialist (dietitian) to adjust your eating plan to your individual calorie needs. This information is not intended to replace advice given to you by your health care provider. Make sure you discuss any questions you have with your health care provider. Document Released: 12/18/2010 Document Revised: 12/23/2015 Document Reviewed:  12/23/2015 Elsevier Interactive Patient Education  [employer].

## 2017-06-23 DIAGNOSIS — I8310 Varicose veins of unspecified lower extremity with inflammation: Secondary | ICD-10-CM | POA: Diagnosis not present

## 2017-06-23 DIAGNOSIS — L814 Other melanin hyperpigmentation: Secondary | ICD-10-CM | POA: Diagnosis not present

## 2017-06-23 DIAGNOSIS — Z7389 Other problems related to life management difficulty: Secondary | ICD-10-CM | POA: Diagnosis not present

## 2017-06-23 DIAGNOSIS — R2681 Unsteadiness on feet: Secondary | ICD-10-CM | POA: Diagnosis not present

## 2017-06-23 DIAGNOSIS — L821 Other seborrheic keratosis: Secondary | ICD-10-CM | POA: Diagnosis not present

## 2017-08-24 ENCOUNTER — Telehealth: Payer: Self-pay

## 2017-08-24 NOTE — Telephone Encounter (Signed)
Patient called requesting appointment with Dr.Pandey at [facility]. Patient aware Dr.Pandey's next available appointment is 08/31/17. I offered patient to see ManX this Thursday and patient refused stating she has an appointment already pending for Thursday at 2:00 pm to consult about a knee replacement.  I offered for patient to come to [facility] to see Dinah Thursday am, patient refused   Patient states she needs Dr.Pandey to give some recommendations. Patient c/o nasal blockage and  sneezing progressing x 1 month.  Patient is taken allegra generic and using flonase  Patient denies, cough, itchy watery eyes, swelling, wheezing or SOB  Please advise

## 2017-08-25 ENCOUNTER — Encounter: Payer: Self-pay | Admitting: Internal Medicine

## 2017-08-25 NOTE — Telephone Encounter (Signed)
Spoke with the patient and she agreed to come to the clinic on 08/31/17 to be evaluated by Dr.Pandey.

## 2017-08-26 DIAGNOSIS — M1711 Unilateral primary osteoarthritis, right knee: Secondary | ICD-10-CM | POA: Diagnosis not present

## 2017-08-31 ENCOUNTER — Encounter: Payer: Self-pay | Admitting: Internal Medicine

## 2017-08-31 ENCOUNTER — Non-Acute Institutional Stay: Payer: Medicare Other | Admitting: Internal Medicine

## 2017-08-31 VITALS — BP 122/70 | HR 60 | Temp 97.9°F | Resp 18 | Ht 68.0 in | Wt 179.2 lb

## 2017-08-31 DIAGNOSIS — L989 Disorder of the skin and subcutaneous tissue, unspecified: Secondary | ICD-10-CM

## 2017-08-31 DIAGNOSIS — I25810 Atherosclerosis of coronary artery bypass graft(s) without angina pectoris: Secondary | ICD-10-CM | POA: Diagnosis not present

## 2017-08-31 DIAGNOSIS — R0981 Nasal congestion: Secondary | ICD-10-CM

## 2017-08-31 NOTE — Progress Notes (Signed)
[facility]  Provider: Blanchie Serve MD   Location:  [place] of Service:  Clinic (12)  PCP: Blanchie Serve, MD Patient Care Team: Blanchie Serve, MD as PCP - General (Internal Medicine) Stephanie Man, MD as PCP - Cardiology (Cardiology) Mast, Shea X, NP as Nurse Practitioner (Internal Medicine)  Extended Emergency Contact Information Primary Emergency Contact: [place] Mobile Phone: [phone] Relation: Daughter  Code Status: DNR  Goals of Care: Advanced Directive information Advanced Directives 08/31/2017  Does Patient Have a Medical Advance Directive? Yes  Type of Paramedic of [place];Out of facility DNR (pink MOST or yellow form)  Does patient want to make changes to medical advance directive? No - Patient declined  Copy of [place] in Chart? Yes  Pre-existing out of facility DNR order (yellow form or pink MOST form) Yellow form placed in chart (order not valid for inpatient use);Pink MOST form placed in chart (order not valid for inpatient use)      Chief Complaint  Patient presents with  . Acute Visit    nasal stuffines  . Medication Refill    No refills needed at this time    HPI: Patient is a 84 y.o. female seen today for acute visit. She feels her nose to be blocked and has been sneezing for a month. This was making her using CPAP at night difficult. A week back she started taking flonase daily at bedtime and has now noticed a drastic improvement. She also got her room deep cleaned and now her breathing is better. She is concerned about a lesion to her right leg. She has had history of skin cancer.   Past Medical History:  Diagnosis Date  . Atherosclerosis of native coronary artery 2001   LAD & Cx disease -- BMS PCI in 2001; CABG 2002.  PCI in 2006 (per-op TAH)  . Breast cancer ([place])   . Cervix dysplasia   . GERD (gastroesophageal reflux disease)   . Heart attack  ([place]) 2002, 2006   Both events reportedly were perioperatively. She has no recollection. Initial PCI was related to angina symptoms (bilateral arm pain)  . High cholesterol   . Hypertension   . Lumbosacral spinal stenosis   . Osteoarthritis   . Skin cancer   . Sleep apnea    Uses CPAP faithfully   Past Surgical History:  Procedure Laterality Date  . ABDOMINAL HYSTERECTOMY    . bone density    . CARDIAC CATHETERIZATION  2001    Dr. Glade Lloyd [facility]) 2 vessel CAD involving LAD-D1 and distal Cx-OM. Appearance of dilated tapered left main with no significant atherosclerosis on IVUS --  BMS PCI of pLAD (Royale BMS 3.0 mm x 15 mm) & with PTCA of ostial D1, PTCA of dLAD.  BMS PCI pCx [facility] BMS 3.5 mm x 11 mm) - unable to reopen dCx-OM (probable distal Atheroembolic occlusion.  Normal LVEF.  Marland Kitchen COLONOSCOPY    . CORONARY ARTERY BYPASS GRAFT  2002   Unsure of details [facility], in [facility])  . CORONARY STENT INTERVENTION  2001   ([place]) Royale BMS PCI pLAD (3.0 mm x 15 mm), pCx (3.5 mm x 11 mm), PTCA of dLAD & ostial D1 (jailed))  . DIAGNOSTIC MAMMOGRAM    . left knee replacement    . MASTECTOMY    . NM MYOVIEW LTD  12/2014   LEXISCAN: Nonischemic, "normal "  . pap smear    .  tonsilectomy    . TRANSTHORACIC ECHOCARDIOGRAM  12/2014   Echocardiogram 12/25/14- normal LVEF and systolic function, EF 17-51%, mild grade 1 diastolic dysfunction    reports that she has never smoked. She has never used smokeless tobacco. She reports that she does not drink alcohol or use drugs. Social History   Socioeconomic History  . Marital status: Widowed    Spouse name: Not on file  . Number of children: Not on file  . Years of education: Not on file  . Highest education level: Not on file  Occupational History  . Not on file  Social Needs  . Financial resource strain: Not hard at all  . Food insecurity:    Worry: Never true    Inability: Never true  . Transportation needs:    Medical: No      Non-medical: No  Tobacco Use  . Smoking status: Never Smoker  . Smokeless tobacco: Never Used  Substance and Sexual Activity  . Alcohol use: No  . Drug use: No  . Sexual activity: Not Currently  Lifestyle  . Physical activity:    Days per week: 7 days    Minutes per session: 60 min  . Stress: Only a little  Relationships  . Social connections:    Talks on phone: More than three times a week    Gets together: More than three times a week    Attends religious service: More than 4 times per year    Active member of club or organization: No    Attends meetings of clubs or organizations: Never    Relationship status: Widowed  . Intimate partner violence:    Fear of current or ex partner: No    Emotionally abused: No    Physically abused: No    Forced sexual activity: No  Other Topics Concern  . Not on file  Social History Narrative   Recently moved back to [place] (lives alone in a [place] apartment at [facility])   Do you drink/eat things with caffeine? 2 cups of coffee every day   What year were you married? [place] - twice widowed   No pets.      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 Has POA      Primary Emergency Contact: WCHENI,DPOE L   Address: [place],  [phone]   Home Phone: [phone]     Family History  Problem Relation Age of Onset  . Stroke Mother   . Heart disease Father     Health Maintenance  Topic Date Due  . FOOT EXAM  10/27/1943  . OPHTHALMOLOGY EXAM  10/27/1943  . PNA vac Low Risk Adult (2 of 2 - PCV13) 01/12/2017  . INFLUENZA VACCINE  08/12/2017  . TETANUS/TDAP  12/01/2017 (Originally 10/28/1952)  . HEMOGLOBIN A1C  11/25/2017  . DEXA SCAN  Completed    Allergies  Allergen Reactions  . Codeine Nausea Only    Outpatient Encounter Medications as of 08/31/2017  Medication Sig  .  acetaminophen (TYLENOL) 500 MG tablet Take 500 mg by mouth at bedtime as needed.  Marland Kitchen aspirin EC 81 MG tablet Take 81 mg by mouth daily.  Marland Kitchen atorvastatin (LIPITOR) 80 MG tablet Take 1 tablet (80 mg total) by mouth daily.  Marland Kitchen  enalapril (VASOTEC) 5 MG tablet Take 1 tablet (5 mg total) by mouth daily.  . fluticasone (FLONASE) 50 MCG/ACT nasal spray Place 1 spray into both nostrils as needed for allergies or rhinitis.  . meloxicam (MOBIC) 7.5 MG tablet Take 1 tablet (7.5 mg total) by mouth daily.  . metoprolol (TOPROL-XL) 200 MG 24 hr tablet Take 1 tablet (200 mg total) by mouth daily.  . nitroGLYCERIN (NITROSTAT) 0.4 MG SL tablet PLACE 1 TABLET UNDER TONGUE EVERY 5 MINUTES FOR 3 DOSES FOR CHEST PAIN  . omeprazole (PRILOSEC) 20 MG capsule Take 20 mg by mouth as needed.   . Wheat Dextrin (BENEFIBER) POWD 2 spoonful mixed in water 3 times a day  . Cholecalciferol 1000 units capsule Take 2,000 Units by mouth daily.  . fexofenadine (ALLEGRA) 180 MG tablet Take 180 mg by mouth daily.   No facility-administered encounter medications on file as of 08/31/2017.     Review of Systems  Constitutional: Negative for appetite change, chills and fever.  HENT: Positive for congestion. Negative for ear discharge, ear pain, mouth sores, postnasal drip, rhinorrhea, sore throat and trouble swallowing.   Respiratory: Negative for shortness of breath.   Cardiovascular: Negative for chest pain.  Gastrointestinal: Negative for abdominal pain.    Vitals:   08/31/17 1131  BP: 122/70  Pulse: 60  Resp: 18  Temp: 97.9 F (36.6 C)  TempSrc: Oral  SpO2: 96%  Weight: 179 lb 3.2 oz (81.3 kg)  Height: 5\' 8"  (1.727 m)   Body mass index is 27.25 kg/m.   Physical Exam  Constitutional: She is oriented to person, place, and time. No distress.  Overweight elderly female  HENT:  Head: Normocephalic and atraumatic.  Mouth/Throat: Oropharynx is clear and moist.  Swollen nasal mucosa, no sinus tenderness  Eyes: Pupils are  equal, round, and reactive to light. Conjunctivae and EOM are normal. Right eye exhibits no discharge. Left eye exhibits no discharge.  Neck: Normal range of motion. Neck supple.  Cardiovascular: Normal rate and regular rhythm.  Murmur heard. Pulmonary/Chest: Effort normal and breath sounds normal.  Abdominal: Soft. Bowel sounds are normal.  Lymphadenopathy:    She has no cervical adenopathy.  Neurological: She is alert and oriented to person, place, and time.  Skin: Skin is warm and dry. She is not diaphoretic.  Dry, raised, keratotic lesion to right leg with irregular border, non tender, no discharge    Labs reviewed: Basic Metabolic Panel: Recent Labs    11/17/16 0725 03/17/17 0936 05/25/17 0000  NA 139 142 140  K 4.3 4.2 4.2  CL 103 104 104  CO2 28 24 29   GLUCOSE 116* 105* 108*  BUN 18 17 16   CREATININE 0.72 0.75 0.81  CALCIUM 9.7 9.8 9.9   Liver Function Tests: Recent Labs    11/17/16 0725 03/17/17 0936 05/25/17 0000  AST 20 25 20   ALT 22 23 20   ALKPHOS  --  54  --   BILITOT 0.7 0.7 1.1  PROT 6.9 7.4 7.3  ALBUMIN  --  4.4  --    No results for input(s): LIPASE, AMYLASE in the last 8760 hours. No results for input(s): AMMONIA in the last 8760 hours. CBC: Recent Labs    11/17/16 0725 05/25/17 0000  WBC 6.4 6.3  NEUTROABS 2,976  --   HGB 13.1 13.2  HCT 38.3 38.9  MCV 93.0 94.0  PLT 221 225   Cardiac Enzymes: No results for input(s): CKTOTAL, CKMB, CKMBINDEX, TROPONINI in the last 8760  hours. BNP: Invalid input(s): POCBNP Lab Results  Component Value Date   HGBA1C 6.1 (H) 05/25/2017   Lab Results  Component Value Date   TSH 1.72 11/17/2016   No results found for: VITAMINB12 No results found for: FOLATE No results found for: IRON, TIBC, FERRITIN  Lipid Panel: Recent Labs    11/17/16 0725 03/17/17 0936 05/25/17 0000  CHOL 167 193 163  HDL 53 56 55  LDLCALC 83 93 81  TRIG 212* 222* 172*  CHOLHDL 3.2 3.4 3.0   Lab Results  Component  Value Date   HGBA1C 6.1 (H) 05/25/2017    Procedures since last visit: No results found.  Assessment/Plan  1. Nasal congestion Has history of allergic rhinitis and OSA, uses CPAP, flonase seems to be helping. continue flonase daily as needed for now and monitor.   2. Skin lesion of right leg With known history of skin cancer, would recommend dermatology to evaluate. Denies itching or pain at present.    Labs/tests ordered: none  Next appointment: has routine visit follow up  Communication: reviewed care plan with patient      Blanchie Serve, MD Internal Medicine [place], [place] [address] Cell Phone (Monday-Friday 8 am - 5 pm): [phone] On Call: [phone] and follow prompts after 5 pm and on weekends Office Phone: [phone] Office Fax: [phone]

## 2017-09-07 ENCOUNTER — Other Ambulatory Visit: Payer: Self-pay | Admitting: *Deleted

## 2017-09-07 DIAGNOSIS — E785 Hyperlipidemia, unspecified: Secondary | ICD-10-CM

## 2017-09-07 DIAGNOSIS — I25810 Atherosclerosis of coronary artery bypass graft(s) without angina pectoris: Secondary | ICD-10-CM

## 2017-09-07 DIAGNOSIS — R739 Hyperglycemia, unspecified: Secondary | ICD-10-CM

## 2017-09-07 DIAGNOSIS — I10 Essential (primary) hypertension: Secondary | ICD-10-CM

## 2017-09-21 DIAGNOSIS — L821 Other seborrheic keratosis: Secondary | ICD-10-CM | POA: Diagnosis not present

## 2017-09-21 DIAGNOSIS — L570 Actinic keratosis: Secondary | ICD-10-CM | POA: Diagnosis not present

## 2017-09-21 DIAGNOSIS — Z85828 Personal history of other malignant neoplasm of skin: Secondary | ICD-10-CM | POA: Diagnosis not present

## 2017-09-24 DIAGNOSIS — Z23 Encounter for immunization: Secondary | ICD-10-CM | POA: Diagnosis not present

## 2017-10-11 DIAGNOSIS — Z961 Presence of intraocular lens: Secondary | ICD-10-CM | POA: Diagnosis not present

## 2017-10-11 DIAGNOSIS — H52203 Unspecified astigmatism, bilateral: Secondary | ICD-10-CM | POA: Diagnosis not present

## 2017-10-19 DIAGNOSIS — Z85828 Personal history of other malignant neoplasm of skin: Secondary | ICD-10-CM | POA: Diagnosis not present

## 2017-10-19 DIAGNOSIS — D485 Neoplasm of uncertain behavior of skin: Secondary | ICD-10-CM | POA: Diagnosis not present

## 2017-10-19 DIAGNOSIS — L820 Inflamed seborrheic keratosis: Secondary | ICD-10-CM | POA: Diagnosis not present

## 2017-10-19 DIAGNOSIS — L814 Other melanin hyperpigmentation: Secondary | ICD-10-CM | POA: Diagnosis not present

## 2017-10-19 DIAGNOSIS — L821 Other seborrheic keratosis: Secondary | ICD-10-CM | POA: Diagnosis not present

## 2017-11-23 DIAGNOSIS — L814 Other melanin hyperpigmentation: Secondary | ICD-10-CM | POA: Diagnosis not present

## 2017-11-23 DIAGNOSIS — L570 Actinic keratosis: Secondary | ICD-10-CM | POA: Diagnosis not present

## 2017-11-23 DIAGNOSIS — Z85828 Personal history of other malignant neoplasm of skin: Secondary | ICD-10-CM | POA: Diagnosis not present

## 2017-11-23 DIAGNOSIS — L821 Other seborrheic keratosis: Secondary | ICD-10-CM | POA: Diagnosis not present

## 2017-11-25 ENCOUNTER — Other Ambulatory Visit: Payer: Self-pay | Admitting: *Deleted

## 2017-11-25 DIAGNOSIS — M159 Polyosteoarthritis, unspecified: Secondary | ICD-10-CM

## 2017-11-25 MED ORDER — MELOXICAM 7.5 MG PO TABS: 7.5000 mg | ORAL_TABLET | Freq: Every day | ORAL | 1 refills | Status: DC

## 2017-11-25 MED ORDER — ENALAPRIL MALEATE 5 MG PO TABS: 5.0000 mg | ORAL_TABLET | Freq: Every day | ORAL | 1 refills | Status: DC

## 2017-11-25 MED ORDER — METOPROLOL SUCCINATE ER 200 MG PO TB24: 200.0000 mg | ORAL_TABLET | Freq: Every day | ORAL | 1 refills | Status: DC

## 2017-11-25 NOTE — Telephone Encounter (Signed)
[facility]

## 2017-12-15 DIAGNOSIS — Z85828 Personal history of other malignant neoplasm of skin: Secondary | ICD-10-CM | POA: Diagnosis not present

## 2017-12-15 DIAGNOSIS — L814 Other melanin hyperpigmentation: Secondary | ICD-10-CM | POA: Diagnosis not present

## 2017-12-15 DIAGNOSIS — L570 Actinic keratosis: Secondary | ICD-10-CM | POA: Diagnosis not present

## 2017-12-15 DIAGNOSIS — L853 Xerosis cutis: Secondary | ICD-10-CM | POA: Diagnosis not present

## 2017-12-15 DIAGNOSIS — L821 Other seborrheic keratosis: Secondary | ICD-10-CM | POA: Diagnosis not present

## 2017-12-21 ENCOUNTER — Other Ambulatory Visit: Payer: Medicare Other

## 2017-12-21 DIAGNOSIS — I10 Essential (primary) hypertension: Secondary | ICD-10-CM

## 2017-12-21 DIAGNOSIS — R739 Hyperglycemia, unspecified: Secondary | ICD-10-CM | POA: Diagnosis not present

## 2017-12-21 DIAGNOSIS — I25810 Atherosclerosis of coronary artery bypass graft(s) without angina pectoris: Secondary | ICD-10-CM

## 2017-12-21 DIAGNOSIS — E785 Hyperlipidemia, unspecified: Secondary | ICD-10-CM | POA: Diagnosis not present

## 2017-12-22 DIAGNOSIS — M1711 Unilateral primary osteoarthritis, right knee: Secondary | ICD-10-CM | POA: Diagnosis not present

## 2017-12-22 LAB — LIPID PANEL
Cholesterol: 169 mg/dL (ref <200)
HDL: 49 mg/dL — ABNORMAL LOW (ref >50)
LDL Cholesterol (Calc): 93 mg/dL (calc)
Non-HDL Cholesterol (Calc): 120 mg/dL (calc) (ref <130)
Total CHOL/HDL Ratio: 3.4 (calc) (ref <5.0)
Triglycerides: 172 mg/dL — ABNORMAL HIGH (ref <150)

## 2017-12-22 LAB — CBC
HCT: 37.9 % (ref 35.0–45.0)
Hemoglobin: 13.1 g/dL (ref 11.7–15.5)
MCH: 32.7 pg (ref 27.0–33.0)
MCHC: 34.6 g/dL (ref 32.0–36.0)
MCV: 94.5 fL (ref 80.0–100.0)
MPV: 10.8 fL (ref 7.5–12.5)
Platelets: 245 10*3/uL (ref 140–400)
RBC: 4.01 10*6/uL (ref 3.80–5.10)
RDW: 12.3 % (ref 11.0–15.0)
WBC: 6.2 10*3/uL (ref 3.8–10.8)

## 2017-12-22 LAB — COMPREHENSIVE METABOLIC PANEL
AG Ratio: 1.3 (calc) (ref 1.0–2.5)
ALT: 16 U/L (ref 6–29)
AST: 17 U/L (ref 10–35)
Albumin: 4.2 g/dL (ref 3.6–5.1)
Alkaline phosphatase (APISO): 49 U/L (ref 33–130)
BUN: 19 mg/dL (ref 7–25)
CO2: 27 mmol/L (ref 20–32)
Calcium: 9.9 mg/dL (ref 8.6–10.4)
Chloride: 104 mmol/L (ref 98–110)
Creat: 0.78 mg/dL (ref 0.60–0.88)
Globulin: 3.2 g/dL (calc) (ref 1.9–3.7)
Glucose, Bld: 112 mg/dL — ABNORMAL HIGH (ref 65–99)
Potassium: 4.2 mmol/L (ref 3.5–5.3)
Sodium: 139 mmol/L (ref 135–146)
Total Bilirubin: 0.4 mg/dL (ref 0.2–1.2)
Total Protein: 7.4 g/dL (ref 6.1–8.1)

## 2017-12-22 LAB — HEMOGLOBIN A1C
Hgb A1c MFr Bld: 6.2 % of total Hgb — ABNORMAL HIGH (ref <5.7)
Mean Plasma Glucose: 131 (calc)
eAG (mmol/L): 7.3 (calc)

## 2017-12-22 LAB — TSH: TSH: 1.45 mIU/L (ref 0.40–4.50)

## 2017-12-28 ENCOUNTER — Encounter: Payer: Self-pay | Admitting: Internal Medicine

## 2017-12-30 ENCOUNTER — Encounter: Payer: Self-pay | Admitting: Nurse Practitioner

## 2017-12-30 ENCOUNTER — Non-Acute Institutional Stay: Payer: Medicare Other | Admitting: Nurse Practitioner

## 2017-12-30 DIAGNOSIS — K219 Gastro-esophageal reflux disease without esophagitis: Secondary | ICD-10-CM

## 2017-12-30 DIAGNOSIS — I10 Essential (primary) hypertension: Secondary | ICD-10-CM | POA: Diagnosis not present

## 2017-12-30 DIAGNOSIS — M159 Polyosteoarthritis, unspecified: Secondary | ICD-10-CM

## 2017-12-30 DIAGNOSIS — R413 Other amnesia: Secondary | ICD-10-CM

## 2017-12-30 DIAGNOSIS — I25810 Atherosclerosis of coronary artery bypass graft(s) without angina pectoris: Secondary | ICD-10-CM | POA: Diagnosis not present

## 2017-12-30 DIAGNOSIS — R739 Hyperglycemia, unspecified: Secondary | ICD-10-CM

## 2017-12-30 DIAGNOSIS — F0390 Unspecified dementia without behavioral disturbance: Secondary | ICD-10-CM | POA: Insufficient documentation

## 2017-12-30 DIAGNOSIS — R269 Unspecified abnormalities of gait and mobility: Secondary | ICD-10-CM

## 2017-12-30 DIAGNOSIS — F0150 Vascular dementia without behavioral disturbance: Secondary | ICD-10-CM | POA: Insufficient documentation

## 2017-12-30 NOTE — Assessment & Plan Note (Addendum)
Ambulates with cane. No recent falls.

## 2017-12-30 NOTE — Assessment & Plan Note (Signed)
Stable, continue Omeprazole 20mg qd.  

## 2017-12-30 NOTE — Assessment & Plan Note (Signed)
Blood pressure is controlled, on Metoprolol 200mg  qd, Enalapril 5mg  qd.

## 2017-12-30 NOTE — Progress Notes (Signed)
Location:   clinic [place]   Place of Service:  Clinic (12)clinic [facility] Provider: Marlana Latus NP  Code Status: DNR Goals of Care: [place] Advanced Directives 08/31/2017  Does Patient Have a Medical Advance Directive? Yes  Type of Paramedic of [place];Out of facility DNR (pink MOST or yellow form)  Does patient want to make changes to medical advance directive? No - Patient declined  Copy of [place] in Chart? Yes  Pre-existing out of facility DNR order (yellow form or pink MOST form) Yellow form placed in chart (order not valid for inpatient use);Pink MOST form placed in chart (order not valid for inpatient use)     Chief Complaint  Patient presents with  . Medical Management of Chronic Issues    6 mo f/u    HPI: Patient is a 84 y.o. female seen today for medical management of chronic diseases.     The patient has history of GERD stable on Omeprazole 20mg  qd. HTN, blood pressure is controlled on Metoprolol 200mg  qd, Enalapril 5mg  qd.  OA pain is managed on Meloxicam 7.5mg  qd, prn 500mg  hs.    Past Medical History:  Diagnosis Date  . Atherosclerosis of native coronary artery 2001   LAD & Cx disease -- BMS PCI in 2001; CABG 2002.  PCI in 2006 (per-op TAH)  . Breast cancer ([place])   . Cervix dysplasia   . GERD (gastroesophageal reflux disease)   . Heart attack ([place]) 2002, 2006   Both events reportedly were perioperatively. She has no recollection. Initial PCI was related to angina symptoms (bilateral arm pain)  . High cholesterol   . Hypertension   . Lumbosacral spinal stenosis   . Osteoarthritis   . Skin cancer   . Sleep apnea    Uses CPAP faithfully    Past Surgical History:  Procedure Laterality Date  . ABDOMINAL HYSTERECTOMY    . bone density    . CARDIAC CATHETERIZATION  2001    Dr. Glade Lloyd [facility]) 2 vessel CAD involving LAD-D1 and distal Cx-OM. Appearance of dilated tapered left main with no significant atherosclerosis on  IVUS --  BMS PCI of pLAD (Royale BMS 3.0 mm x 15 mm) & with PTCA of ostial D1, PTCA of dLAD.  BMS PCI pCx [facility] BMS 3.5 mm x 11 mm) - unable to reopen dCx-OM (probable distal Atheroembolic occlusion.  Normal LVEF.  Marland Kitchen COLONOSCOPY    . CORONARY ARTERY BYPASS GRAFT  2002   Unsure of details [facility] in [facility])  . CORONARY STENT INTERVENTION  2001   ([place]) Royale BMS PCI pLAD (3.0 mm x 15 mm), pCx (3.5 mm x 11 mm), PTCA of dLAD & ostial D1 (jailed))  . DIAGNOSTIC MAMMOGRAM    . left knee replacement    . MASTECTOMY    . NM MYOVIEW LTD  12/2014   LEXISCAN: Nonischemic, "normal "  . pap smear    . tonsilectomy    . TRANSTHORACIC ECHOCARDIOGRAM  12/2014   Echocardiogram 12/25/14- normal LVEF and systolic function, EF 38-45%, mild grade 1 diastolic dysfunction    Allergies  Allergen Reactions  . Codeine Nausea Only    Allergies as of 12/30/2017      Reactions   Codeine Nausea Only      Medication List       Accurate as of December 30, 2017 11:59 PM. Always use your most recent med list.        acetaminophen 500 MG  tablet Commonly known as:  TYLENOL Take 500 mg by mouth at bedtime as needed.   aspirin EC 81 MG tablet Take 81 mg by mouth daily.   atorvastatin 80 MG tablet Commonly known as:  LIPITOR Take 1 tablet (80 mg total) by mouth daily.   BENEFIBER Powd 2 spoonful mixed in water 3 times a day   Cholecalciferol 25 MCG (1000 UT) capsule Take 2,000 Units by mouth daily.   enalapril 5 MG tablet Commonly known as:  VASOTEC Take 1 tablet (5 mg total) by mouth daily.   fexofenadine 180 MG tablet Commonly known as:  ALLEGRA Take 180 mg by mouth daily.   fluticasone 50 MCG/ACT nasal spray Commonly known as:  FLONASE Place 1 spray into both nostrils as needed for allergies or rhinitis.   meloxicam 7.5 MG tablet Commonly known as:  MOBIC Take 1 tablet (7.5 mg total) by mouth daily.   metoprolol 200 MG 24 hr tablet Commonly known as:  TOPROL-XL Take 1  tablet (200 mg total) by mouth daily.   nitroGLYCERIN 0.4 MG SL tablet Commonly known as:  NITROSTAT PLACE 1 TABLET UNDER TONGUE EVERY 5 MINUTES FOR 3 DOSES FOR CHEST PAIN   omeprazole 20 MG capsule Commonly known as:  PRILOSEC Take 20 mg by mouth as needed.       Review of Systems:  Review of Systems  Constitutional: Negative for activity change, appetite change, chills, diaphoresis, fatigue and fever.  HENT: Negative for congestion, hearing loss and voice change.   Respiratory: Negative for cough, shortness of breath and wheezing.   Cardiovascular: Negative for chest pain, palpitations and leg swelling.  Gastrointestinal: Negative for abdominal distention, abdominal pain, constipation, diarrhea, nausea and vomiting.  Genitourinary: Negative for difficulty urinating, dysuria and urgency.  Musculoskeletal: Positive for arthralgias and gait problem.       S/p L TKR, right knee OA s/p inj, f/u Ortho.   Skin: Negative for color change and pallor.  Neurological: Negative for dizziness, speech difficulty and headaches.       Memory lapses.   Psychiatric/Behavioral: Negative for agitation, behavioral problems, hallucinations and sleep disturbance. The patient is not nervous/anxious.     Health Maintenance  Topic Date Due  . FOOT EXAM  10/27/1943  . OPHTHALMOLOGY EXAM  10/27/1943  . TETANUS/TDAP  10/28/1954  . PNA vac Low Risk Adult (2 of 2 - PCV13) 01/12/2017  . HEMOGLOBIN A1C  06/22/2018  . INFLUENZA VACCINE  Completed  . DEXA SCAN  Completed    Physical Exam: Vitals:   12/30/17 1416  BP: 118/60  Pulse: (!) 51  Resp: 20  Temp: 97.7 F (36.5 C)  SpO2: 96%  Weight: 177 lb 9.6 oz (80.6 kg)  Height: 5\' 8"  (1.727 m)   Body mass index is 27 kg/m. Physical Exam Constitutional:      General: She is not in acute distress.    Appearance: Normal appearance. She is not ill-appearing or diaphoretic.  HENT:     Head: Normocephalic and atraumatic.     Nose: Nose normal.      Mouth/Throat:     Mouth: Mucous membranes are dry.  Eyes:     Extraocular Movements: Extraocular movements intact.     Pupils: Pupils are equal, round, and reactive to light.  Neck:     Musculoskeletal: Normal range of motion and neck supple.  Cardiovascular:     Rate and Rhythm: Normal rate and regular rhythm.     Heart sounds: Murmur present.  Pulmonary:  Effort: Pulmonary effort is normal.     Breath sounds: Normal breath sounds. No wheezing, rhonchi or rales.  Abdominal:     General: There is no distension.     Palpations: Abdomen is soft.     Tenderness: There is no abdominal tenderness. There is no guarding or rebound.  Musculoskeletal:     Right lower leg: No edema.     Left lower leg: No edema.  Skin:    General: Skin is warm and dry.  Neurological:     General: No focal deficit present.     Mental Status: She is alert and oriented to person, place, and time.     Motor: No weakness.     Coordination: Coordination normal.     Gait: Gait abnormal.     Deep Tendon Reflexes: Reflexes normal.  Psychiatric:        Mood and Affect: Mood normal.        Behavior: Behavior normal.        Thought Content: Thought content normal.        Judgment: Judgment normal.     Labs reviewed: Basic Metabolic Panel: Recent Labs    03/17/17 0936 05/25/17 0000 12/21/17 0715  NA 142 140 139  K 4.2 4.2 4.2  CL 104 104 104  CO2 24 29 27   GLUCOSE 105* 108* 112*  BUN 17 16 19   CREATININE 0.75 0.81 0.78  CALCIUM 9.8 9.9 9.9  TSH  --   --  1.45   Liver Function Tests: Recent Labs    03/17/17 0936 05/25/17 0000 12/21/17 0715  AST 25 20 17   ALT 23 20 16   ALKPHOS 54  --   --   BILITOT 0.7 1.1 0.4  PROT 7.4 7.3 7.4  ALBUMIN 4.4  --   --    No results for input(s): LIPASE, AMYLASE in the last 8760 hours. No results for input(s): AMMONIA in the last 8760 hours. CBC: Recent Labs    05/25/17 0000 12/21/17 0715  WBC 6.3 6.2  HGB 13.2 13.1  HCT 38.9 37.9  MCV 94.0 94.5    PLT 225 245   Lipid Panel: Recent Labs    03/17/17 0936 05/25/17 0000 12/21/17 0715  CHOL 193 163 169  HDL 56 55 49*  LDLCALC 93 81 93  TRIG 222* 172* 172*  CHOLHDL 3.4 3.0 3.4   Lab Results  Component Value Date   HGBA1C 6.2 (H) 12/21/2017    Procedures since last visit: No results found.  Assessment/Plan  Hypertension Blood pressure is controlled, on Metoprolol 200mg  qd, Enalapril 5mg  qd.   Gastroesophageal reflux disease Stable, continue Omeprazole 20mg  qd.   Osteoarthritis of multiple joints Stable, left TKR, right knee inj, continue Meloxicam 7.5mg  qd, prn Tylenol 500mg  hs prn  Gait abnormality Ambulates with cane. No recent falls.   Hyperglycemia Prediabetic nature, Hgb a1c 6.2 12/21/17, continue diet, exercise, and monitor Hgb a1c  Mild memory disturbance Memory lapses, MMSE 27/30, passed clock drawing test. Observe.    Labs/tests ordered:  None  Next appt:  6 months.

## 2017-12-30 NOTE — Assessment & Plan Note (Addendum)
Stable, left TKR, right knee inj, continue Meloxicam 7.5mg  qd, prn Tylenol 500mg  hs prn

## 2017-12-30 NOTE — Patient Instructions (Addendum)
F/u in clinic [facility] 6 months. Prediabetic nature, Hgb a1c 6.2 12/21/17, continue diet, exercise, and monitor Hgb a1c. Update tDAP

## 2017-12-30 NOTE — Assessment & Plan Note (Signed)
Memory lapses, MMSE 27/30, passed clock drawing test. Observe.

## 2017-12-30 NOTE — Assessment & Plan Note (Signed)
Prediabetic nature, Hgb a1c 6.2 12/21/17, continue diet, exercise, and monitor Hgb a1c

## 2018-01-17 ENCOUNTER — Other Ambulatory Visit: Payer: Self-pay | Admitting: Family Medicine

## 2018-01-17 ENCOUNTER — Other Ambulatory Visit: Payer: Self-pay | Admitting: *Deleted

## 2018-01-17 DIAGNOSIS — Z1231 Encounter for screening mammogram for malignant neoplasm of breast: Secondary | ICD-10-CM

## 2018-01-24 ENCOUNTER — Other Ambulatory Visit: Payer: Self-pay | Admitting: Internal Medicine

## 2018-01-24 DIAGNOSIS — Z1231 Encounter for screening mammogram for malignant neoplasm of breast: Secondary | ICD-10-CM

## 2018-02-01 DIAGNOSIS — Z85828 Personal history of other malignant neoplasm of skin: Secondary | ICD-10-CM | POA: Diagnosis not present

## 2018-02-01 DIAGNOSIS — D485 Neoplasm of uncertain behavior of skin: Secondary | ICD-10-CM | POA: Diagnosis not present

## 2018-02-01 DIAGNOSIS — D0461 Carcinoma in situ of skin of right upper limb, including shoulder: Secondary | ICD-10-CM | POA: Diagnosis not present

## 2018-02-01 DIAGNOSIS — L821 Other seborrheic keratosis: Secondary | ICD-10-CM | POA: Diagnosis not present

## 2018-02-16 ENCOUNTER — Ambulatory Visit: Payer: Medicare Other | Admitting: Cardiology

## 2018-02-17 ENCOUNTER — Encounter: Payer: Self-pay | Admitting: *Deleted

## 2018-02-23 ENCOUNTER — Ambulatory Visit
Admission: RE | Admit: 2018-02-23 | Discharge: 2018-02-23 | Disposition: A | Discharge status: Home / Self Care | Payer: Medicare Other | Source: Ambulatory Visit | Attending: Internal Medicine | Admitting: Internal Medicine

## 2018-02-23 ENCOUNTER — Other Ambulatory Visit: Payer: Self-pay | Admitting: Internal Medicine

## 2018-02-23 DIAGNOSIS — Z1231 Encounter for screening mammogram for malignant neoplasm of breast: Secondary | ICD-10-CM

## 2018-03-07 ENCOUNTER — Encounter: Payer: Self-pay | Admitting: Cardiology

## 2018-03-07 ENCOUNTER — Ambulatory Visit (INDEPENDENT_AMBULATORY_CARE_PROVIDER_SITE_OTHER): Payer: Medicare Other | Admitting: Cardiology

## 2018-03-07 VITALS — BP 122/70 | HR 66 | Ht 67.0 in | Wt 182.6 lb

## 2018-03-07 DIAGNOSIS — E785 Hyperlipidemia, unspecified: Secondary | ICD-10-CM

## 2018-03-07 DIAGNOSIS — E663 Overweight: Secondary | ICD-10-CM

## 2018-03-07 DIAGNOSIS — I10 Essential (primary) hypertension: Secondary | ICD-10-CM

## 2018-03-07 DIAGNOSIS — I2510 Atherosclerotic heart disease of native coronary artery without angina pectoris: Secondary | ICD-10-CM

## 2018-03-07 MED ORDER — METOPROLOL SUCCINATE ER 200 MG PO TB24: 200.0000 mg | ORAL_TABLET | Freq: Every day | ORAL | 1 refills | Status: DC

## 2018-03-07 MED ORDER — NITROGLYCERIN 0.4 MG SL SUBL: SUBLINGUAL_TABLET | SUBLINGUAL | 0 refills | Status: DC

## 2018-03-07 MED ORDER — ATORVASTATIN CALCIUM 80 MG PO TABS: 80.0000 mg | ORAL_TABLET | Freq: Every day | ORAL | 1 refills | Status: DC

## 2018-03-07 NOTE — Progress Notes (Signed)
PCP: Mast, [name], NP  Former Cardiologist : Dr. Kellie Simmering Address: [address], [place], [place] [address]  Phone: [phone]  Clinic Note: Chief Complaint  Patient presents with  . Follow-up    No complaints.  . Coronary Artery Disease    Active and having no angina pain    HPI: Stephanie Shea is a 85 y.o. female with distant history of CAD noted below, is here for annual follow-up.  [name] has moved back to [facility] after having retired down to [employer].  CAD history dates back to 2001 -- progressive exertional angina (bilateral arm aching. 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  2001 -- ([place], Dr. Glade Lloyd) Cath-PCI of pLAD & PTCA of ostial D1 (jailed), dLAD PTCA.  PCI of pCx, but unable to open dCx-OM.  IVIS of left main  ? Peri-op MI 2002 --> CABG ([place])  2006 [facility], peri-op MI for TAH -- PCI  Last STRESS TEST in 2018 by former Cardiologist (Dr. Kellie Simmering.  [place], [place] ph# [phone]) - no further action  [name] was last seen by by me for initial evaluation in January 2019 - was doing well from Cardiac Standpoint - no CP or dyspnea.  Mild LE Edema.  (Her angina was bilateral arm pain).  Recent Hospitalizations: none  Studies Personally Reviewed - (if available, images/films reviewed: From Epic Chart or Care Everywhere)  None  Interval History: [name] returns here today overall in great spirits, doing very well.  She is thoroughly enjoying being at [facility].  The only downside is that she is putting on weight because of the food being so good and so readily available.  She is doing all kinds of activities including different exercise regimens.  The most exciting one that she does is called cardio drumming which entails different drumming take activities on large bouncy ball.  She says that she does this for about 45 minutes 1 hour at a time and is exhausted by the end but feels  great.  She denies any chest pain or pressure associated with it. She walks around her living area with a cane for balance (related to spinal stenosis), but has no issues.  Is just that she is limited this how far fast she can walk.  She says she is last used nitroglycerin over 2 years ago, and has not had any further chest pain or pressure with either rest or exertion.  No PND, orthopnea with only mild end of day edema.  She does note occasional skipping beats, but nothing prolonged.  Nothing more than a few intermittent beats that are off but, but no rapid irregular beats.  No syncope/near syncope or TIA/amaurosis fugax.  Denies any claudication.  No bleeding issues.  She routinely uses her CPAP, thoroughly enjoys it. She says she gets great sleep when using it. She feels well rested afterwards. Has no troubles at night.  ROS: A comprehensive was performed. Review of Systems  Constitutional: Negative for chills, fever, malaise/fatigue and weight loss.  HENT: Positive for congestion. Negative for nosebleeds and sinus pain.        Related to seasonal allergies  Eyes: Negative.   Respiratory: Negative for cough and wheezing.        Only symptomatic with seasonal allergies  Cardiovascular: Negative for claudication.  Gastrointestinal: Positive for constipation (Usually controlled with diet, occasionally needs stool softener). Negative for blood in stool and melena.  Genitourinary: Negative for hematuria.  Musculoskeletal: Negative for falls and joint pain.       Walks with a cane for balance  Skin:       Dry scaly skin, no rash  Neurological: Negative for dizziness, focal weakness and loss of consciousness.  Endo/Heme/Allergies: Positive for environmental allergies (Seasonal).  Psychiatric/Behavioral: Negative.   All other systems reviewed and are negative.  I have reviewed and (if needed) personally updated the patient's problem list, medications, allergies, past medical and surgical  history, social and family history.   Past Medical History:  Diagnosis Date  . Atherosclerosis of native coronary artery 2001   LAD & Cx disease -- BMS PCI in 2001; CABG 2002.  PCI in 2006 (per-op TAH)  . Breast cancer ([place])   . Cervix dysplasia   . GERD (gastroesophageal reflux disease)   . Heart attack ([place]) 2002, 2006   Both events reportedly were perioperatively. She has no recollection. Initial PCI was related to angina symptoms (bilateral arm pain)  . High cholesterol   . Hypertension   . Lumbosacral spinal stenosis   . Osteoarthritis   . Skin cancer   . Sleep apnea    Uses CPAP faithfully    Past Surgical History:  Procedure Laterality Date  . ABDOMINAL HYSTERECTOMY    . bone density    . CARDIAC CATHETERIZATION  2001    Dr. Glade Lloyd [facility]) 2 vessel CAD involving LAD-D1 and distal Cx-OM. Appearance of dilated tapered left main with no significant atherosclerosis on IVUS --  BMS PCI of pLAD ([facility] BMS 3.0 mm x 15 mm) & with PTCA of ostial D1, PTCA of dLAD.  BMS PCI pCx [facility] BMS 3.5 mm x 11 mm) - unable to reopen dCx-OM (probable distal Atheroembolic occlusion.  Normal LVEF.  Marland Kitchen COLONOSCOPY    . CORONARY ARTERY BYPASS GRAFT  2002   Unsure of details [facility])  . CORONARY STENT INTERVENTION  2001   ([place]) [facility] B[facility] PCI pLAD (3.0 mm x 15 mm) -PTCA of jailed D1 as well as distal LAD; pCx ([facility] BMS 3.5 mm x 11 mm) -unable to open distalCx-OM -thromboembolic occlusion  . DIAGNOSTIC MAMMOGRAM    . left knee replacement    . MASTECTOMY    . NM MYOVIEW LTD  12/2014; 2018   Cardiologist (Dr. Kellie Simmering.  [place], [place] ph# [phone]) -->LEXISCAN: Nonischemic, "normal "  . pap smear    . tonsilectomy    . TRANSTHORACIC ECHOCARDIOGRAM  12/2014   Echocardiogram 12/25/14- normal LVEF and systolic function, EF 26-71%, mild grade 1 diastolic dysfunction     Current Meds  Medication Sig  . acetaminophen (TYLENOL) 500 MG tablet Take 500 mg by mouth at  bedtime as needed.  Marland Kitchen aspirin EC 81 MG tablet Take 81 mg by mouth daily.  Marland Kitchen atorvastatin (LIPITOR) 80 MG tablet Take 1 tablet (80 mg total) by mouth daily.  . Cholecalciferol 1000 units capsule Take 2,000 Units by mouth daily.  . enalapril (VASOTEC) 5 MG tablet Take 1 tablet (5 mg total) by mouth daily.  . fexofenadine (ALLEGRA) 180 MG tablet Take 180 mg by mouth daily.  . fluticasone (FLONASE) 50 MCG/ACT nasal spray Place 1 spray into both nostrils as needed for allergies or rhinitis.  . meloxicam (MOBIC) 7.5 MG tablet Take 1 tablet (7.5 mg total) by mouth daily.  . metoprolol (TOPROL-XL) 200 MG 24 hr tablet Take 1 tablet (200 mg total) by mouth daily.  . nitroGLYCERIN (NITROSTAT)  0.4 MG SL tablet PLACE 1 TABLET UNDER TONGUE EVERY 5 MINUTES FOR 3 DOSES FOR CHEST PAIN  . omeprazole (PRILOSEC) 20 MG capsule Take 20 mg by mouth as needed.   . Wheat Dextrin (BENEFIBER) POWD 2 spoonful mixed in water 3 times a day  . [DISCONTINUED] atorvastatin (LIPITOR) 80 MG tablet Take 1 tablet (80 mg total) by mouth daily.  . [DISCONTINUED] metoprolol (TOPROL-XL) 200 MG 24 hr tablet Take 1 tablet (200 mg total) by mouth daily.  . [DISCONTINUED] nitroGLYCERIN (NITROSTAT) 0.4 MG SL tablet PLACE 1 TABLET UNDER TONGUE EVERY 5 MINUTES FOR 3 DOSES FOR CHEST PAIN    Allergies  Allergen Reactions  . Codeine Nausea Only    Social History   Tobacco Use  . Smoking status: Never Smoker  . Smokeless tobacco: Never Used  Substance Use Topics  . Alcohol use: No  . Drug use: No   Social History   Social History Narrative   Recently moved back to [place] (lives alone in a [place] apartment at [facility])   Do you drink/eat things with caffeine? 2 cups of coffee every day   What year were you married? [place] - twice widowed   No pets.      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 Has POA       Primary Emergency Contact: PPJKDT,OIZT L   Address: [address] [place],  [place] [address]   Home Phone: [phone]   Family History family history includes Heart disease in her father; Stroke in her mother.  Wt Readings from Last 3 Encounters:  03/07/18 182 lb 9.6 oz (82.8 kg)  12/30/17 177 lb 9.6 oz (80.6 kg)  08/31/17 179 lb 3.2 oz (81.3 kg)    PHYSICAL EXAM BP 122/70   Pulse 66   Ht 5\' 7"  (1.702 m)   Wt 182 lb 9.6 oz (82.8 kg)   BMI 28.60 kg/m  Physical Exam  Constitutional: She is oriented to person, place, and time. She appears well-developed and well-nourished. No distress.  HENT:  Head: Normocephalic and atraumatic.  Mouth/Throat: Oropharynx is clear and moist. No oropharyngeal exudate.  Eyes: Pupils are equal, round, and reactive to light. Conjunctivae and EOM are normal. No scleral icterus.  Neck: Normal range of motion. Neck supple. No hepatojugular reflux and no JVD present. Carotid bruit is not present. No tracheal deviation present. No thyromegaly present.  Cardiovascular: Normal rate, regular rhythm, S1 normal, S2 normal, intact distal pulses and normal pulses.  No extrasystoles are present. PMI is not displaced. Exam reveals no gallop and no friction rub.  Murmur heard.  Medium-pitched harsh crescendo-decrescendo early systolic murmur is present with a grade of 1/6 at the upper right sternal border radiating to the neck. Pulmonary/Chest: Effort normal and breath sounds normal. No respiratory distress. She has no wheezes. She has no rales.  Abdominal: Soft. Bowel sounds are normal. She exhibits no distension. There is no abdominal tenderness. There is no rebound.  No HSM  Musculoskeletal: Normal range of motion.        General: No edema.  Neurological: She is alert and oriented to person, place, and time.  Psychiatric: She has a normal mood and affect. Her behavior is normal. Judgment and thought content normal.  Vitals reviewed.    Adult ECG Report   Rate: 66 ;  Rhythm: normal sinus rhythm and Nonspecific Lateral ST and T-wave abnormality -most consistent with LVH and repolarization changes.. Otherwise normal axis, intervals and durations.;   Narrative Interpretation: Relatively normal/nonspecific EKG  Other studies Reviewed: Additional studies/ records that were reviewed today include:  Recent Labs:   Lab Results  Component Value Date   CREATININE 0.78 12/21/2017   BUN 19 12/21/2017   NA 139 12/21/2017   K 4.2 12/21/2017   CL 104 12/21/2017   CO2 27 12/21/2017   Lab Results  Component Value Date   CHOL 169 12/21/2017   HDL 49 (L) 12/21/2017   LDLCALC 93 12/21/2017   TRIG 172 (H) 12/21/2017   CHOLHDL 3.4 12/21/2017  --Not quite at goal.  ASSESSMENT / PLAN: Problem List Items Addressed This Visit    Coronary artery disease involving native heart without angina pectoris - Primary (Chronic)    History of CAD and reportedly post CABG.  Most recent stress test in 2018 was nonischemic. No recurrent angina symptoms.  She is on aspirin, high-dose Toprol plus enalapril and atorvastatin plus Benefiber.  In the absence of any active symptoms, I think we can probably forego follow-up stress testing.  I would, however like to get her CABG report.      Relevant Medications   metoprolol (TOPROL-XL) 200 MG 24 hr tablet   nitroGLYCERIN (NITROSTAT) 0.4 MG SL tablet   atorvastatin (LIPITOR) 80 MG tablet   Other Relevant Orders   EKG 12-Lead   Essential hypertension (Chronic)    Blood pressure is pretty well controlled as is heart rate on high-dose Toprol along with enalapril.      Relevant Medications   metoprolol (TOPROL-XL) 200 MG 24 hr tablet   nitroGLYCERIN (NITROSTAT) 0.4 MG SL tablet   atorvastatin (LIPITOR) 80 MG tablet   Hyperlipidemia with target low density lipoprotein (LDL) cholesterol less than 70 mg/dL (Chronic)    Not quite at goal on high-dose atorvastatin.  She seems to be doing fairly well without any memory issues  or myalgias. Would be reluctant to be overly aggressive.  She would not be interested in taking additional medications and I do not think referral for PCSK9 ammeter is warranted given her age and level of stability.      Relevant Medications   metoprolol (TOPROL-XL) 200 MG 24 hr tablet   nitroGLYCERIN (NITROSTAT) 0.4 MG SL tablet   atorvastatin (LIPITOR) 80 MG tablet   Overweight (BMI 25.0-29.9)    Discussed importance of may be cutting back some of the extra meals but continue her exercise.  Watching her weight and potentially trying to lose weight will help her lipids as well.         Current medicines are reviewed at length with the patient today. (+/- concerns) n/a The following changes have been made: n/a  Patient Instructions  Medication Instructions:  NOT NEEDED If you need a refill on your cardiac medications before your next appointment, please call your pharmacy.   Lab work: NOT NEEDED If you have labs (blood work) drawn today and your tests are completely normal, you will receive your results only by: Marland Kitchen MyChart Message (if you have MyChart) OR . A paper copy in the mail If you have any lab test that is abnormal or we need to change your treatment, we will call you to review the results.  Testing/Procedures: NOT NEEDED  Follow-Up: At [facility], you and your health needs are our priority.  As part of our continuing mission to provide you  with exceptional heart care, we have created designated Provider Care Teams.  These Care Teams include your primary Cardiologist (physician) and Advanced Practice Providers (APPs -  Physician Assistants and Nurse Practitioners) who all work together to provide you with the care you need, when you need it. You will need a follow up appointment in 12 months.  Please call our office 2 months in advance to schedule this appointment.  You may see Glenetta Hew, MD or one of the following Advanced Practice Providers on your designated  Care Team:   Rosaria Ferries, PA-C . Jory Sims, DNP, ANP  Any Other Special Instructions Will Be Listed Below (If Applicable).    Studies Ordered:   Orders Placed This Encounter  Procedures  . EKG 12-Lead    - Will likely not need follow-up labs until after June visit.    Glenetta Hew, M.D., M.S. Interventional Cardiologist   Pager # [phone] Phone # [phone] [address]. [place], [place] [address]   Thank you for choosing Heartcare at [facility]!!

## 2018-03-07 NOTE — Assessment & Plan Note (Signed)
Blood pressure is pretty well controlled as is heart rate on high-dose Toprol along with enalapril.

## 2018-03-07 NOTE — Assessment & Plan Note (Signed)
Discussed importance of may be cutting back some of the extra meals but continue her exercise.  Watching her weight and potentially trying to lose weight will help her lipids as well.

## 2018-03-07 NOTE — Patient Instructions (Signed)
Medication Instructions:  NOT NEEDED If you need a refill on your cardiac medications before your next appointment, please call your pharmacy.   Lab work: NOT NEEDED If you have labs (blood work) drawn today and your tests are completely normal, you will receive your results only by: . MyChart Message (if you have MyChart) OR . A paper copy in the mail If you have any lab test that is abnormal or we need to change your treatment, we will call you to review the results.  Testing/Procedures:  NOT NEEDED Follow-Up: At [facility], you and your health needs are our priority.  As part of our continuing mission to provide you with exceptional heart care, we have created designated Provider Care Teams.  These Care Teams include your primary Cardiologist (physician) and Advanced Practice Providers (APPs -  Physician Assistants and Nurse Practitioners) who all work together to provide you with the care you need, when you need it. . You will need a follow up appointment in  12   months.  Please call our office 2 months in advance to schedule this appointment.  You may see David Harding, MD or one of the following Advanced Practice Providers on your designated Care Team:   . Rhonda Barrett, PA-C . Kathryn Lawrence, DNP, ANP  Any Other Special Instructions Will Be Listed Below (If Applicable). 

## 2018-03-07 NOTE — Assessment & Plan Note (Signed)
Not quite at goal on high-dose atorvastatin.  She seems to be doing fairly well without any memory issues or myalgias. Would be reluctant to be overly aggressive.  She would not be interested in taking additional medications and I do not think referral for PCSK9 ammeter is warranted given her age and level of stability.

## 2018-03-07 NOTE — Assessment & Plan Note (Signed)
History of CAD and reportedly post CABG.  Most recent stress test in 2018 was nonischemic. No recurrent angina symptoms.  She is on aspirin, high-dose Toprol plus enalapril and atorvastatin plus Benefiber.  In the absence of any active symptoms, I think we can probably forego follow-up stress testing.  I would, however like to get her CABG report.

## 2018-05-06 ENCOUNTER — Other Ambulatory Visit: Payer: Self-pay | Admitting: Family Medicine

## 2018-05-06 DIAGNOSIS — M159 Polyosteoarthritis, unspecified: Secondary | ICD-10-CM

## 2018-05-11 ENCOUNTER — Other Ambulatory Visit: Payer: Self-pay

## 2018-05-11 ENCOUNTER — Encounter: Payer: Medicare Other | Admitting: Internal Medicine

## 2018-05-13 DIAGNOSIS — L821 Other seborrheic keratosis: Secondary | ICD-10-CM | POA: Diagnosis not present

## 2018-05-13 DIAGNOSIS — C44519 Basal cell carcinoma of skin of other part of trunk: Secondary | ICD-10-CM | POA: Diagnosis not present

## 2018-05-13 DIAGNOSIS — L853 Xerosis cutis: Secondary | ICD-10-CM | POA: Diagnosis not present

## 2018-05-13 DIAGNOSIS — L570 Actinic keratosis: Secondary | ICD-10-CM | POA: Diagnosis not present

## 2018-05-13 DIAGNOSIS — Z85828 Personal history of other malignant neoplasm of skin: Secondary | ICD-10-CM | POA: Diagnosis not present

## 2018-05-16 ENCOUNTER — Other Ambulatory Visit: Payer: Self-pay

## 2018-05-16 DIAGNOSIS — M159 Polyosteoarthritis, unspecified: Secondary | ICD-10-CM

## 2018-05-16 MED ORDER — MELOXICAM 7.5 MG PO TABS: 7.5000 mg | ORAL_TABLET | Freq: Every day | ORAL | 0 refills | Status: DC

## 2018-05-16 MED ORDER — ENALAPRIL MALEATE 5 MG PO TABS: 5.0000 mg | ORAL_TABLET | Freq: Every day | ORAL | 0 refills | Status: DC

## 2018-05-20 ENCOUNTER — Non-Acute Institutional Stay: Payer: Medicare Other | Admitting: Internal Medicine

## 2018-05-20 ENCOUNTER — Encounter: Payer: Self-pay | Admitting: Internal Medicine

## 2018-05-20 ENCOUNTER — Other Ambulatory Visit: Payer: Self-pay

## 2018-05-20 VITALS — BP 110/68 | HR 95 | Temp 97.7°F | Ht 67.0 in | Wt 173.6 lb

## 2018-05-20 DIAGNOSIS — E785 Hyperlipidemia, unspecified: Secondary | ICD-10-CM

## 2018-05-20 DIAGNOSIS — K439 Ventral hernia without obstruction or gangrene: Secondary | ICD-10-CM

## 2018-05-20 DIAGNOSIS — M159 Polyosteoarthritis, unspecified: Secondary | ICD-10-CM | POA: Diagnosis not present

## 2018-05-20 DIAGNOSIS — I10 Essential (primary) hypertension: Secondary | ICD-10-CM

## 2018-05-20 DIAGNOSIS — I25810 Atherosclerosis of coronary artery bypass graft(s) without angina pectoris: Secondary | ICD-10-CM | POA: Diagnosis not present

## 2018-05-20 DIAGNOSIS — R7303 Prediabetes: Secondary | ICD-10-CM | POA: Insufficient documentation

## 2018-05-20 MED ORDER — ATORVASTATIN CALCIUM 80 MG PO TABS: 80.0000 mg | ORAL_TABLET | Freq: Every day | ORAL | 1 refills | Status: DC

## 2018-05-20 MED ORDER — ENALAPRIL MALEATE 5 MG PO TABS: 5.0000 mg | ORAL_TABLET | Freq: Every day | ORAL | 1 refills | Status: DC

## 2018-05-20 MED ORDER — NITROGLYCERIN 0.4 MG SL SUBL: SUBLINGUAL_TABLET | SUBLINGUAL | 1 refills | Status: DC

## 2018-05-20 MED ORDER — METOPROLOL SUCCINATE ER 200 MG PO TB24: 200.0000 mg | ORAL_TABLET | Freq: Every day | ORAL | 1 refills | Status: DC

## 2018-05-20 MED ORDER — MELOXICAM 7.5 MG PO TABS: 7.5000 mg | ORAL_TABLET | Freq: Every day | ORAL | 1 refills | Status: DC

## 2018-05-20 NOTE — Progress Notes (Signed)
Location:  [place] of Service:  Clinic (12)  Provider:   Code Status:  Goals of Care:  Advanced Directives 05/20/2018  Does Patient Have a Medical Advance Directive? Yes  Type of Paramedic of [place];Out of facility DNR (pink MOST or yellow form)  Does patient want to make changes to medical advance directive? No - Patient declined  Copy of [place] in Chart? Yes - validated most recent copy scanned in chart (See row information)  Pre-existing out of facility DNR order (yellow form or pink MOST form) Yellow form placed in chart (order not valid for inpatient use);Pink MOST form placed in chart (order not valid for inpatient use)     Chief Complaint  Patient presents with  . Acute Visit    getting weaker, need a walker,feels like she is not getting enough exercise, bulges on stomach     HPI: Patient is a 85 y.o. female seen today for medical management of chronic diseases.   Patient here for Follow up. She has h/o CAD s/p PTCA in 2001 and 2006,S/P CABG in 2002, Last Echo showed EF of 55%, Stress Test was normal in 2016, Hyperlipidemia, GERD, Allergic Rhinitis, Arthritis s/p Left Knee Surgery,H/o Skin Cancer, Hypertension, Breast Cancer   Patient had no acute complains. She did have toe Nail which came off as it got stuck to her carpet. She was seen by her dermatologist and was told to keep it clean. She also wanted me to see her Abdominal Hernia.  Patient also Has Mild LE edema Left more then Right with Chronic Venous stasis changes. But she denies any Chest pain ,Cough or SOB. She walks with the cane and independent in her ADLS. Patient requesting refills for her Meds     Past Medical History:  Diagnosis Date  . Atherosclerosis of native coronary artery 2001   LAD & Cx disease -- BMS PCI in 2001; CABG 2002.  PCI in 2006 (per-op TAH)  . Breast cancer ([place])   . Cervix dysplasia   . GERD (gastroesophageal  reflux disease)   . Heart attack ([place]) 2002, 2006   Both events reportedly were perioperatively. She has no recollection. Initial PCI was related to angina symptoms (bilateral arm pain)  . High cholesterol   . Hypertension   . Lumbosacral spinal stenosis   . Osteoarthritis   . Skin cancer   . Sleep apnea    Uses CPAP faithfully    Past Surgical History:  Procedure Laterality Date  . ABDOMINAL HYSTERECTOMY    . bone density    . CARDIAC CATHETERIZATION  2001    Dr. Glade Lloyd [facility]) 2 vessel CAD involving LAD-D1 and distal Cx-OM. Appearance of dilated tapered left main with no significant atherosclerosis on IVUS --  BMS PCI of pLAD ([facility] BMS 3.0 mm x 15 mm) & with PTCA of ostial D1, PTCA of dLAD.  BMS PCI pCx [facility] BMS 3.5 mm x 11 mm) - unable to reopen dCx-OM (probable distal Atheroembolic occlusion.  Normal LVEF.  Marland Kitchen COLONOSCOPY    . CORONARY ARTERY BYPASS GRAFT  2002   Unsure of details [facility], in [facility])  . CORONARY STENT INTERVENTION  2001   ([place]) [facility] BMS PCI pLAD (3.0 mm x 15 mm) -PTCA of jailed D1 as well as distal LAD; pCx ([facility] BMS 3.5 mm x 11 mm) -unable to open distalCx-OM -thromboembolic occlusion  . DIAGNOSTIC MAMMOGRAM    . left knee  replacement    . MASTECTOMY    . NM MYOVIEW LTD  12/2014; 2018   Cardiologist (Dr. Kellie Simmering.  [place], [place] ph# [phone]) -->LEXISCAN: Nonischemic, "normal "  . pap smear    . tonsilectomy    . TRANSTHORACIC ECHOCARDIOGRAM  12/2014   Echocardiogram 12/25/14- normal LVEF and systolic function, EF 08-67%, mild grade 1 diastolic dysfunction    Allergies  Allergen Reactions  . Codeine Nausea Only    Outpatient Encounter Medications as of 05/20/2018  Medication Sig  . acetaminophen (TYLENOL) 500 MG tablet Take 500 mg by mouth at bedtime as needed.  Marland Kitchen aspirin EC 81 MG tablet Take 81 mg by mouth daily.  Marland Kitchen atorvastatin (LIPITOR) 80 MG tablet Take 1 tablet (80 mg total) by mouth daily.  . Cholecalciferol 1000  units capsule Take 2,000 Units by mouth daily.  . enalapril (VASOTEC) 5 MG tablet Take 1 tablet (5 mg total) by mouth daily.  . fexofenadine (ALLEGRA) 180 MG tablet Take 180 mg by mouth daily.  . fluticasone (FLONASE) 50 MCG/ACT nasal spray Place 1 spray into both nostrils as needed for allergies or rhinitis.  . meloxicam (MOBIC) 7.5 MG tablet Take 1 tablet (7.5 mg total) by mouth daily.  . metoprolol (TOPROL-XL) 200 MG 24 hr tablet Take 1 tablet (200 mg total) by mouth daily.  . nitroGLYCERIN (NITROSTAT) 0.4 MG SL tablet PLACE 1 TABLET UNDER TONGUE EVERY 5 MINUTES FOR 3 DOSES FOR CHEST PAIN  . omeprazole (PRILOSEC) 20 MG capsule Take 20 mg by mouth as needed.   . Wheat Dextrin (BENEFIBER) POWD 2 spoonful mixed in water 3 times a day   No facility-administered encounter medications on file as of 05/20/2018.     Review of Systems:  Review of Systems  Review of Systems  Constitutional: Negative for activity change, appetite change, chills, diaphoresis, fatigue and fever.  HENT: Negative for mouth sores, postnasal drip, rhinorrhea, sinus pain and sore throat.   Respiratory: Negative for apnea, cough, chest tightness, shortness of breath and wheezing.   Cardiovascular: Negative for chest pain, palpitations and leg swelling.  Gastrointestinal: Negative for abdominal distention, abdominal pain, constipation, diarrhea, nausea and vomiting.  Genitourinary: Negative for dysuria and frequency.  Musculoskeletal: Negative for arthralgias, joint swelling and myalgias.  Skin: Negative for rash.  Neurological: Negative for dizziness, syncope, weakness, light-headedness and numbness.  Psychiatric/Behavioral: Negative for behavioral problems, confusion and sleep disturbance.     Health Maintenance  Topic Date Due  . FOOT EXAM  10/27/1943  . OPHTHALMOLOGY EXAM  10/27/1943  . TETANUS/TDAP  10/28/1954  . PNA vac Low Risk Adult (2 of 2 - PCV13) 01/12/2017  . HEMOGLOBIN A1C  06/22/2018  . INFLUENZA  VACCINE  08/13/2018  . DEXA SCAN  Completed    Physical Exam: Vitals:   05/20/18 1131  BP: 110/68  Pulse: 95  Temp: 97.7 F (36.5 C)  TempSrc: Oral  SpO2: 95%  Weight: 173 lb 9.6 oz (78.7 kg)  Height: 5' 7"  (1.702 m)   Body mass index is 27.19 kg/m. Physical Exam Vitals signs reviewed.  Constitutional:      Appearance: She is normal weight.  HENT:     Head: Normocephalic.     Nose: Nose normal.     Mouth/Throat:     Mouth: Mucous membranes are moist.     Pharynx: Oropharynx is clear.  Eyes:     Pupils: Pupils are equal, round, and reactive to light.  Neck:     Musculoskeletal: Neck supple.  Cardiovascular:     Rate and Rhythm: Normal rate and regular rhythm.     Pulses: Normal pulses.     Heart sounds: Normal heart sounds. No murmur.  Pulmonary:     Effort: Pulmonary effort is normal. No respiratory distress.     Breath sounds: Normal breath sounds. No wheezing or rales.  Abdominal:     General: Abdomen is flat. Bowel sounds are normal. There is no distension.     Palpations: Abdomen is soft.     Tenderness: There is no abdominal tenderness.  Musculoskeletal:     Comments: Patient has Moderate Edema Bilateral Left More then right with Chronic Venous changes bilateral Has 3rd toe off with Callus. But no active infection  Skin:    General: Skin is warm and dry.     Comments: Patient has lot of chronic changes due to Sun Damage  Neurological:     General: No focal deficit present.     Mental Status: She is alert and oriented to person, place, and time.  Psychiatric:        Mood and Affect: Mood normal.        Thought Content: Thought content normal.        Judgment: Judgment normal.     Labs reviewed: Basic Metabolic Panel: Recent Labs    05/25/17 0000 12/21/17 0715  NA 140 139  K 4.2 4.2  CL 104 104  CO2 29 27  GLUCOSE 108* 112*  BUN 16 19  CREATININE 0.81 0.78  CALCIUM 9.9 9.9  TSH  --  1.45   Liver Function Tests: Recent Labs    05/25/17  0000 12/21/17 0715  AST 20 17  ALT 20 16  BILITOT 1.1 0.4  PROT 7.3 7.4   No results for input(s): LIPASE, AMYLASE in the last 8760 hours. No results for input(s): AMMONIA in the last 8760 hours. CBC: Recent Labs    05/25/17 0000 12/21/17 0715  WBC 6.3 6.2  HGB 13.2 13.1  HCT 38.9 37.9  MCV 94.0 94.5  PLT 225 245   Lipid Panel: Recent Labs    05/25/17 0000 12/21/17 0715  CHOL 163 169  HDL 55 49*  LDLCALC 81 93  TRIG 172* 172*  CHOLHDL 3.0 3.4   Lab Results  Component Value Date   HGBA1C 6.2 (H) 12/21/2017    Procedures since last visit: No results found.  Assessment/Plan  Essential hypertension  BP stable on Enalapril  Plan: CBC with Differential/Platelet CMP LE edema with chronic venous changes D/w About Ted hoses and ? Lasix PRN. She wants to think about it  Osteoarthritis of multiple joints Continues to have Right knee pain. Following with ortho for Injections  Plan: meloxicam (MOBIC) 7.5 MG tablet, PRN CMP with eGFR([facility])  CAD Follows with Cardiology. Doing well. On Aspirin and Statin and ACE inhibitor  Hyperlipidemia  Last LDL was less then 100 On High dose of Lipitor - Plan: Lipid Panel  Abdominal wall hernia She does not want ot see Surgeon right now Will let me know if she has any issues  Prediabetes -  Plan: TSH, Hemoglobin A1c Has Lost Almost 5-6 lbs as she is cutting back on her Meals to loose some weight          Labs/tests ordered:  '@ORDERS'$ @ Next appt:  08/18/2018   Total time spent in this patient care encounter was  40_  minutes; greater than 50% of the visit spent counseling patient and staff, reviewing records , Labs  and coordinating care for problems addressed at this encounter.

## 2018-05-26 ENCOUNTER — Telehealth: Payer: Self-pay

## 2018-05-26 NOTE — Telephone Encounter (Signed)
Patient called and stated she had recently had an appointment with Dr. Lyndel Safe and forgot to mention her request for a rolling walker. Patient states she is trying to walk more and currently uses a cane. She would like to get a walker to help her in walking more. Routing to provider for further review.

## 2018-05-27 NOTE — Telephone Encounter (Signed)
You will need to put the DX code and print out and sign. Send order to [facility] fax [phone] you can see if Shirlean Mylar can fax it. Because you will need to sign it.

## 2018-06-14 NOTE — Telephone Encounter (Signed)
Order is still pending. Please advise.

## 2018-06-15 ENCOUNTER — Other Ambulatory Visit: Payer: Self-pay | Admitting: Internal Medicine

## 2018-06-15 DIAGNOSIS — R269 Unspecified abnormalities of gait and mobility: Secondary | ICD-10-CM

## 2018-06-15 DIAGNOSIS — M48061 Spinal stenosis, lumbar region without neurogenic claudication: Secondary | ICD-10-CM

## 2018-06-16 NOTE — Telephone Encounter (Signed)
Order faxed to [facility]

## 2018-06-21 DIAGNOSIS — M6281 Muscle weakness (generalized): Secondary | ICD-10-CM | POA: Diagnosis not present

## 2018-06-21 DIAGNOSIS — R2681 Unsteadiness on feet: Secondary | ICD-10-CM | POA: Diagnosis not present

## 2018-06-21 DIAGNOSIS — M25561 Pain in right knee: Secondary | ICD-10-CM | POA: Diagnosis not present

## 2018-06-23 DIAGNOSIS — M6281 Muscle weakness (generalized): Secondary | ICD-10-CM | POA: Diagnosis not present

## 2018-06-23 DIAGNOSIS — M25561 Pain in right knee: Secondary | ICD-10-CM | POA: Diagnosis not present

## 2018-06-23 DIAGNOSIS — R2681 Unsteadiness on feet: Secondary | ICD-10-CM | POA: Diagnosis not present

## 2018-06-28 DIAGNOSIS — M25561 Pain in right knee: Secondary | ICD-10-CM | POA: Diagnosis not present

## 2018-06-28 DIAGNOSIS — M6281 Muscle weakness (generalized): Secondary | ICD-10-CM | POA: Diagnosis not present

## 2018-06-28 DIAGNOSIS — R2681 Unsteadiness on feet: Secondary | ICD-10-CM | POA: Diagnosis not present

## 2018-06-29 DIAGNOSIS — M6281 Muscle weakness (generalized): Secondary | ICD-10-CM | POA: Diagnosis not present

## 2018-06-29 DIAGNOSIS — M25561 Pain in right knee: Secondary | ICD-10-CM | POA: Diagnosis not present

## 2018-06-29 DIAGNOSIS — R2681 Unsteadiness on feet: Secondary | ICD-10-CM | POA: Diagnosis not present

## 2018-06-30 ENCOUNTER — Encounter: Payer: Self-pay | Admitting: Nurse Practitioner

## 2018-07-01 DIAGNOSIS — M1711 Unilateral primary osteoarthritis, right knee: Secondary | ICD-10-CM | POA: Diagnosis not present

## 2018-07-06 DIAGNOSIS — M6281 Muscle weakness (generalized): Secondary | ICD-10-CM | POA: Diagnosis not present

## 2018-07-06 DIAGNOSIS — R2681 Unsteadiness on feet: Secondary | ICD-10-CM | POA: Diagnosis not present

## 2018-07-06 DIAGNOSIS — M25561 Pain in right knee: Secondary | ICD-10-CM | POA: Diagnosis not present

## 2018-07-08 DIAGNOSIS — M1711 Unilateral primary osteoarthritis, right knee: Secondary | ICD-10-CM | POA: Diagnosis not present

## 2018-07-08 DIAGNOSIS — M25561 Pain in right knee: Secondary | ICD-10-CM | POA: Diagnosis not present

## 2018-07-12 DIAGNOSIS — R2681 Unsteadiness on feet: Secondary | ICD-10-CM | POA: Diagnosis not present

## 2018-07-12 DIAGNOSIS — M6281 Muscle weakness (generalized): Secondary | ICD-10-CM | POA: Diagnosis not present

## 2018-07-12 DIAGNOSIS — M25561 Pain in right knee: Secondary | ICD-10-CM | POA: Diagnosis not present

## 2018-07-18 DIAGNOSIS — M1711 Unilateral primary osteoarthritis, right knee: Secondary | ICD-10-CM | POA: Diagnosis not present

## 2018-07-21 DIAGNOSIS — R2681 Unsteadiness on feet: Secondary | ICD-10-CM | POA: Diagnosis not present

## 2018-07-21 DIAGNOSIS — M6281 Muscle weakness (generalized): Secondary | ICD-10-CM | POA: Diagnosis not present

## 2018-07-21 DIAGNOSIS — M25561 Pain in right knee: Secondary | ICD-10-CM | POA: Diagnosis not present

## 2018-07-26 DIAGNOSIS — B351 Tinea unguium: Secondary | ICD-10-CM | POA: Diagnosis not present

## 2018-07-26 DIAGNOSIS — L84 Corns and callosities: Secondary | ICD-10-CM | POA: Diagnosis not present

## 2018-07-26 DIAGNOSIS — E1159 Type 2 diabetes mellitus with other circulatory complications: Secondary | ICD-10-CM | POA: Diagnosis not present

## 2018-07-27 DIAGNOSIS — M25561 Pain in right knee: Secondary | ICD-10-CM | POA: Diagnosis not present

## 2018-07-27 DIAGNOSIS — R2681 Unsteadiness on feet: Secondary | ICD-10-CM | POA: Diagnosis not present

## 2018-07-27 DIAGNOSIS — M6281 Muscle weakness (generalized): Secondary | ICD-10-CM | POA: Diagnosis not present

## 2018-07-29 DIAGNOSIS — M25561 Pain in right knee: Secondary | ICD-10-CM | POA: Diagnosis not present

## 2018-07-29 DIAGNOSIS — M6281 Muscle weakness (generalized): Secondary | ICD-10-CM | POA: Diagnosis not present

## 2018-07-29 DIAGNOSIS — R2681 Unsteadiness on feet: Secondary | ICD-10-CM | POA: Diagnosis not present

## 2018-08-03 DIAGNOSIS — M6281 Muscle weakness (generalized): Secondary | ICD-10-CM | POA: Diagnosis not present

## 2018-08-03 DIAGNOSIS — R2681 Unsteadiness on feet: Secondary | ICD-10-CM | POA: Diagnosis not present

## 2018-08-03 DIAGNOSIS — M25561 Pain in right knee: Secondary | ICD-10-CM | POA: Diagnosis not present

## 2018-08-09 ENCOUNTER — Other Ambulatory Visit: Payer: Self-pay | Admitting: Nurse Practitioner

## 2018-08-09 DIAGNOSIS — M159 Polyosteoarthritis, unspecified: Secondary | ICD-10-CM

## 2018-08-16 ENCOUNTER — Other Ambulatory Visit: Payer: Medicare Other

## 2018-08-16 ENCOUNTER — Other Ambulatory Visit: Payer: Self-pay

## 2018-08-16 DIAGNOSIS — R7303 Prediabetes: Secondary | ICD-10-CM | POA: Diagnosis not present

## 2018-08-16 DIAGNOSIS — I10 Essential (primary) hypertension: Secondary | ICD-10-CM | POA: Diagnosis not present

## 2018-08-16 DIAGNOSIS — E785 Hyperlipidemia, unspecified: Secondary | ICD-10-CM

## 2018-08-16 DIAGNOSIS — M159 Polyosteoarthritis, unspecified: Secondary | ICD-10-CM

## 2018-08-17 LAB — CBC WITH DIFFERENTIAL/PLATELET
Absolute Monocytes: 637 cells/uL (ref 200–950)
Basophils Absolute: 40 cells/uL (ref 0–200)
Basophils Relative: 0.6 %
Eosinophils Absolute: 147 cells/uL (ref 15–500)
Eosinophils Relative: 2.2 %
HCT: 39.3 % (ref 35.0–45.0)
Hemoglobin: 13 g/dL (ref 11.7–15.5)
Lymphs Abs: 2318 cells/uL (ref 850–3900)
MCH: 31.8 pg (ref 27.0–33.0)
MCHC: 33.1 g/dL (ref 32.0–36.0)
MCV: 96.1 fL (ref 80.0–100.0)
MPV: 10.7 fL (ref 7.5–12.5)
Monocytes Relative: 9.5 %
Neutro Abs: 3558 cells/uL (ref 1500–7800)
Neutrophils Relative %: 53.1 %
Platelets: 229 10*3/uL (ref 140–400)
RBC: 4.09 10*6/uL (ref 3.80–5.10)
RDW: 12.4 % (ref 11.0–15.0)
Total Lymphocyte: 34.6 %
WBC: 6.7 10*3/uL (ref 3.8–10.8)

## 2018-08-17 LAB — HEMOGLOBIN A1C
Hgb A1c MFr Bld: 6.2 % of total Hgb — ABNORMAL HIGH (ref <5.7)
Mean Plasma Glucose: 131 (calc)
eAG (mmol/L): 7.3 (calc)

## 2018-08-17 LAB — LIPID PANEL
Cholesterol: 188 mg/dL (ref <200)
HDL: 53 mg/dL (ref >=50)
LDL Cholesterol (Calc): 99 mg/dL (calc)
Non-HDL Cholesterol (Calc): 135 mg/dL (calc) — ABNORMAL HIGH (ref <130)
Total CHOL/HDL Ratio: 3.5 (calc) (ref <5.0)
Triglycerides: 240 mg/dL — ABNORMAL HIGH (ref <150)

## 2018-08-17 LAB — COMPLETE METABOLIC PANEL WITH GFR
AG Ratio: 1.5 (calc) (ref 1.0–2.5)
ALT: 18 U/L (ref 6–29)
AST: 17 U/L (ref 10–35)
Albumin: 4.3 g/dL (ref 3.6–5.1)
Alkaline phosphatase (APISO): 48 U/L (ref 37–153)
BUN: 15 mg/dL (ref 7–25)
CO2: 29 mmol/L (ref 20–32)
Calcium: 10.1 mg/dL (ref 8.6–10.4)
Chloride: 105 mmol/L (ref 98–110)
Creat: 0.82 mg/dL (ref 0.60–0.88)
GFR, Est African American: 77 mL/min/{1.73_m2} (ref >=60)
GFR, Est Non African American: 67 mL/min/{1.73_m2} (ref >=60)
Globulin: 2.9 g/dL (calc) (ref 1.9–3.7)
Glucose, Bld: 102 mg/dL — ABNORMAL HIGH (ref 65–99)
Potassium: 4.3 mmol/L (ref 3.5–5.3)
Sodium: 142 mmol/L (ref 135–146)
Total Bilirubin: 0.9 mg/dL (ref 0.2–1.2)
Total Protein: 7.2 g/dL (ref 6.1–8.1)

## 2018-08-17 LAB — TSH: TSH: 2.35 mIU/L (ref 0.40–4.50)

## 2018-08-18 ENCOUNTER — Other Ambulatory Visit: Payer: Self-pay

## 2018-08-19 ENCOUNTER — Non-Acute Institutional Stay: Payer: Medicare Other | Admitting: Internal Medicine

## 2018-08-19 ENCOUNTER — Other Ambulatory Visit: Payer: Self-pay

## 2018-08-19 ENCOUNTER — Encounter: Payer: Self-pay | Admitting: Internal Medicine

## 2018-08-19 VITALS — BP 118/76 | HR 60 | Temp 98.0°F | Ht 67.0 in | Wt 181.2 lb

## 2018-08-19 DIAGNOSIS — I10 Essential (primary) hypertension: Secondary | ICD-10-CM | POA: Diagnosis not present

## 2018-08-19 DIAGNOSIS — R600 Localized edema: Secondary | ICD-10-CM

## 2018-08-19 DIAGNOSIS — Z853 Personal history of malignant neoplasm of breast: Secondary | ICD-10-CM

## 2018-08-19 DIAGNOSIS — M159 Polyosteoarthritis, unspecified: Secondary | ICD-10-CM

## 2018-08-19 DIAGNOSIS — I25810 Atherosclerosis of coronary artery bypass graft(s) without angina pectoris: Secondary | ICD-10-CM | POA: Diagnosis not present

## 2018-08-19 DIAGNOSIS — M8580 Other specified disorders of bone density and structure, unspecified site: Secondary | ICD-10-CM | POA: Diagnosis not present

## 2018-08-19 DIAGNOSIS — Z9189 Other specified personal risk factors, not elsewhere classified: Secondary | ICD-10-CM

## 2018-08-19 DIAGNOSIS — E785 Hyperlipidemia, unspecified: Secondary | ICD-10-CM

## 2018-08-19 DIAGNOSIS — Z13820 Encounter for screening for osteoporosis: Secondary | ICD-10-CM | POA: Diagnosis not present

## 2018-08-19 DIAGNOSIS — G4733 Obstructive sleep apnea (adult) (pediatric): Secondary | ICD-10-CM | POA: Diagnosis not present

## 2018-08-19 DIAGNOSIS — Z9989 Dependence on other enabling machines and devices: Secondary | ICD-10-CM

## 2018-08-19 DIAGNOSIS — R7303 Prediabetes: Secondary | ICD-10-CM

## 2018-08-19 DIAGNOSIS — J309 Allergic rhinitis, unspecified: Secondary | ICD-10-CM | POA: Diagnosis not present

## 2018-08-19 NOTE — Progress Notes (Signed)
Location: [place] of Service:  Clinic (12)  Provider:   Code Status:  Goals of Care:  Advanced Directives 08/19/2018  Does Patient Have a Medical Advance Directive? Yes  Type of Paramedic of [place];Out of facility DNR (pink MOST or yellow form)  Does patient want to make changes to medical advance directive? No - Patient declined  Copy of [place] in Chart? Yes - validated most recent copy scanned in chart (See row information)  Pre-existing out of facility DNR order (yellow form or pink MOST form) Yellow form placed in chart (order not valid for inpatient use)     Chief Complaint  Patient presents with  . Medical Management of Chronic Issues    3  month follow up, discuss lab results   . Health Maintenance    ophthalmology exam, tetanus vaccine, pcv13,influenza vaccine, foot exam     HPI: Patient is a 85 y.o. female seen today for an acute visit for follow up of her Labs  She has h/o CAD s/p PTCA in 2001 and 2006,S/P CABG in 2002, Last Echo showed EF of 55%, Stress Test was normal in 2016, Hyperlipidemia, GERD, Allergic Rhinitis, Arthritis s/p Left Knee Surgery,H/o Skin Cancer, Hypertension, Breast Cancer   Patient came to follow-up for her multiple medical problems and go over her lab results Overall she is doing very well Did not have any acute complaints She walks with a cane and is independent in her ADLs.  Has not had any falls still driving  Past Medical History:  Diagnosis Date  . Atherosclerosis of native coronary artery 2001   LAD & Cx disease -- BMS PCI in 2001; CABG 2002.  PCI in 2006 (per-op TAH)  . Breast cancer ([place])   . Cervix dysplasia   . GERD (gastroesophageal reflux disease)   . Heart attack ([place]) 2002, 2006   Both events reportedly were perioperatively. She has no recollection. Initial PCI was related to angina symptoms (bilateral arm pain)  . High cholesterol   . Hypertension   .  Lumbosacral spinal stenosis   . Osteoarthritis   . Skin cancer   . Sleep apnea    Uses CPAP faithfully    Past Surgical History:  Procedure Laterality Date  . ABDOMINAL HYSTERECTOMY    . bone density    . CARDIAC CATHETERIZATION  2001    Dr. Glade Lloyd [facility]) 2 vessel CAD involving LAD-D1 and distal Cx-OM. Appearance of dilated tapered left main with no significant atherosclerosis on IVUS --  BMS PCI of pLAD ([facility] BMS 3.0 mm x 15 mm) & with PTCA of ostial D1, PTCA of dLAD.  BMS PCI pCx [facility] BMS 3.5 mm x 11 mm) - unable to reopen dCx-OM (probable distal Atheroembolic occlusion.  Normal LVEF.  Marland Kitchen COLONOSCOPY    . CORONARY ARTERY BYPASS GRAFT  2002   Unsure of details [facility] in [facility])  . CORONARY STENT INTERVENTION  2001   ([place]) [facility] BMS PCI pLAD (3.0 mm x 15 mm) -PTCA of jailed D1 as well as distal LAD; pCx ([facility] BMS 3.5 mm x 11 mm) -unable to open distalCx-OM -thromboembolic occlusion  . DIAGNOSTIC MAMMOGRAM    . left knee replacement    . MASTECTOMY    . NM MYOVIEW LTD  12/2014; 2018   Cardiologist (Dr. Kellie Simmering.  [place], [place] ph# [phone]) -->LEXISCAN: Nonischemic, "normal "  . pap smear    . tonsilectomy    .  TRANSTHORACIC ECHOCARDIOGRAM  12/2014   Echocardiogram 12/25/14- normal LVEF and systolic function, EF 61-60%, mild grade 1 diastolic dysfunction    Allergies  Allergen Reactions  . Codeine Nausea Only    Outpatient Encounter Medications as of 08/19/2018  Medication Sig  . acetaminophen (TYLENOL) 500 MG tablet Take 500 mg by mouth at bedtime as needed.  Marland Kitchen aspirin EC 81 MG tablet Take 81 mg by mouth daily.  Marland Kitchen atorvastatin (LIPITOR) 80 MG tablet Take 1 tablet (80 mg total) by mouth daily.  . Cholecalciferol 1000 units capsule Take 2,000 Units by mouth daily.  . enalapril (VASOTEC) 5 MG tablet TAKE 1 TABLET BY MOUTH EVERY DAY  . fexofenadine (ALLEGRA) 180 MG tablet Take 180 mg by mouth daily.  . fluticasone (FLONASE) 50 MCG/ACT nasal spray  Place 1 spray into both nostrils as needed for allergies or rhinitis.  . meloxicam (MOBIC) 7.5 MG tablet TAKE 1 TABLET BY MOUTH EVERY DAY  . metoprolol (TOPROL-XL) 200 MG 24 hr tablet Take 1 tablet (200 mg total) by mouth daily.  . nitroGLYCERIN (NITROSTAT) 0.4 MG SL tablet PLACE 1 TABLET UNDER TONGUE EVERY 5 MINUTES FOR 3 DOSES FOR CHEST PAIN  . omeprazole (PRILOSEC) 20 MG capsule Take 20 mg by mouth as needed.   . Wheat Dextrin (BENEFIBER) POWD 2 spoonful mixed in water 3 times a day   No facility-administered encounter medications on file as of 08/19/2018.     Review of Systems:  Review of Systems  Review of Systems  Constitutional: Negative for activity change, appetite change, chills, diaphoresis, fatigue and fever.  HENT: Negative for mouth sores, postnasal drip, rhinorrhea, sinus pain and sore throat.   Respiratory: Negative for apnea, cough, chest tightness, shortness of breath and wheezing.   Cardiovascular: Negative for chest pain, palpitations and Positive for  leg swelling.  Gastrointestinal: Negative for abdominal distention, abdominal pain, Positive for Constipation diarrhea, nausea and vomiting.  Genitourinary: Negative for dysuria and frequency.  Musculoskeletal: Positive for arthralgias,  Skin: Negative for rash.  Neurological: Negative for dizziness, syncope, weakness, light-headedness and numbness.  Psychiatric/Behavioral: Negative for behavioral problems, confusion and sleep disturbance.     Health Maintenance  Topic Date Due  . FOOT EXAM  10/27/1943  . OPHTHALMOLOGY EXAM  10/27/1943  . TETANUS/TDAP  10/28/1954  . PNA vac Low Risk Adult (2 of 2 - PCV13) 01/12/2017  . INFLUENZA VACCINE  08/13/2018  . HEMOGLOBIN A1C  02/16/2019  . DEXA SCAN  Completed    Physical Exam: Vitals:   08/19/18 0950  BP: 118/76  Pulse: 60  Temp: 98 F (36.7 C)  TempSrc: Oral  SpO2: 95%  Weight: 181 lb 3.2 oz (82.2 kg)  Height: 5\' 7"  (1.702 m)   Body mass index is 28.38 kg/m.  Physical Exam  Constitutional: Oriented to person, place, and time. Well-developed and well-nourished.  HENT:  Head: Normocephalic.  Mouth/Throat: Oropharynx is clear and moist.  Eyes: Pupils are equal, round, and reactive to light.  Neck: Neck supple.  Cardiovascular: Normal rate and normal heart sounds.  No murmur heard. Pulmonary/Chest: Effort normal and breath sounds normal. No respiratory distress. No wheezes. She has no rales.  Abdominal: Soft. Bowel sounds are normal. No distension. There is no tenderness. There is no rebound. Has Umbilical Hernia Musculoskeletal: Has Mild Edema Bilateral Left More then Right With Chronic Venous changes Lymphadenopathy: none Neurological: Alert and oriented to person, place, and time.  Skin: Skin is warm and dry.  Psychiatric: Normal mood and affect. Behavior  is normal. Thought content normal.    Labs reviewed: Basic Metabolic Panel: Recent Labs    12/21/17 0715 08/16/18 0730  NA 139 142  K 4.2 4.3  CL 104 105  CO2 27 29  GLUCOSE 112* 102*  BUN 19 15  CREATININE 0.78 0.82  CALCIUM 9.9 10.1  TSH 1.45 2.35   Liver Function Tests: Recent Labs    12/21/17 0715 08/16/18 0730  AST 17 17  ALT 16 18  BILITOT 0.4 0.9  PROT 7.4 7.2   No results for input(s): LIPASE, AMYLASE in the last 8760 hours. No results for input(s): AMMONIA in the last 8760 hours. CBC: Recent Labs    12/21/17 0715 08/16/18 0730  WBC 6.2 6.7  NEUTROABS  --  3,558  HGB 13.1 13.0  HCT 37.9 39.3  MCV 94.5 96.1  PLT 245 229   Lipid Panel: Recent Labs    12/21/17 0715 08/16/18 0730  CHOL 169 188  HDL 49* 53  LDLCALC 93 99  TRIG 172* 240*  CHOLHDL 3.4 3.5   Lab Results  Component Value Date   HGBA1C 6.2 (H) 08/16/2018    Procedures since last visit: No results found.  Assessment/Plan 1. Essential hypertension BP controlled on Enalapril Creat stable Repeat in 4 months  2. Coronary artery disease involving coronary bypass graft of native  heart without angina pectoris Follows with Cardiology She is on aspirin statin and ACE inhibitor and High Dose of Beta Blockers  3. Prediabetes A1c 6.2 Continue diet modification and exercise  4. Hyperlipidemia with target low density lipoprotein (LDL) cholesterol  LDl is 99 Continue on Statin high dose Follows with Cardiology   5. OSA on CPAP Tolerating CPAP  6. Osteopenia, unspecified location Needs Follow up of Bone Density Scan Last was in 2017 7. History of breast cancer in female Repeat Mammogram Was negative  Allergic rhinitis,  - Plan:  Continue on Allegra  Bilateral leg edema - Plan:  She does not want to try Lasix prn for now Or Ted hoses I told her to elevate Legs When in the apartment Osteoarthritis Continue on Meloxicam PRN Just got Knee injected and feels better H/O Skin Cancer Patient Due for Follow up with Dermatology    Labs/tests ordered:  * No order type specified * Next appt:  09/22/2018 Total time spent in this patient care encounter was 40 _  minutes; greater than 50% of the visit spent counseling patient and staff, reviewing records , Labs and coordinating care for problems addressed at this encounter.

## 2018-08-23 DIAGNOSIS — R2681 Unsteadiness on feet: Secondary | ICD-10-CM | POA: Diagnosis not present

## 2018-08-23 DIAGNOSIS — M6281 Muscle weakness (generalized): Secondary | ICD-10-CM | POA: Diagnosis not present

## 2018-08-24 DIAGNOSIS — Z23 Encounter for immunization: Secondary | ICD-10-CM | POA: Diagnosis not present

## 2018-08-25 MED ORDER — TETANUS-DIPHTH-ACELL PERTUSSIS 5-2.5-18.5 LF-MCG/0.5 IM SUSP: 0.5000 mL | Freq: Once | INTRAMUSCULAR | 0 refills | Status: AC

## 2018-08-25 MED ORDER — PNEUMOCOCCAL 13-VAL CONJ VACC IM SUSP: 0.5000 mL | INTRAMUSCULAR | 0 refills | Status: AC

## 2018-09-22 ENCOUNTER — Encounter: Payer: Medicare Other | Admitting: Nurse Practitioner

## 2018-09-29 ENCOUNTER — Ambulatory Visit: Payer: Medicare Other | Admitting: Nurse Practitioner

## 2018-09-29 ENCOUNTER — Other Ambulatory Visit: Payer: Self-pay

## 2018-09-29 VITALS — BP 130/78 | HR 76 | Temp 97.3°F | Resp 18 | Ht 67.0 in | Wt 189.2 lb

## 2018-09-29 DIAGNOSIS — Z780 Asymptomatic menopausal state: Secondary | ICD-10-CM | POA: Diagnosis not present

## 2018-09-29 DIAGNOSIS — Z0000 Encounter for general adult medical examination without abnormal findings: Secondary | ICD-10-CM | POA: Diagnosis not present

## 2018-09-29 DIAGNOSIS — M8580 Other specified disorders of bone density and structure, unspecified site: Secondary | ICD-10-CM

## 2018-09-29 NOTE — Patient Instructions (Addendum)
. Stephanie Shea , Thank you for taking time to come for your Medicare Wellness Visit. I appreciate your ongoing commitment to your health goals. Please review the following plan we discussed and let me know if I can assist you in the future.   Screening recommendations/referrals: Colonoscopy none Mammogram up to date, yearly.  Bone Density order sent Recommended yearly ophthalmology/optometry visit for glaucoma screening and checkup Recommended yearly dental visit for hygiene and checkup  Vaccinations: Influenza vaccine up to date Pneumococcal vaccine up to date Tdap vaccine up to date  Shingles vaccine pending 2nd dose of Singrix.   Advanced directives: yes  Conditions/risks identified: yes  Next appointment: one year  Preventive Care 41 Years and Older, Female Preventive care refers to lifestyle choices and visits with your health care provider that can promote health and wellness. What does preventive care include?  A yearly physical exam. This is also called an annual well check.  Dental exams once or twice a year.  Routine eye exams. Ask your health care provider how often you should have your eyes checked.  Personal lifestyle choices, including:  Daily care of your teeth and gums.  Regular physical activity.  Eating a healthy diet.  Avoiding tobacco and drug use.  Limiting alcohol use.  Practicing safe sex.  Taking low-dose aspirin every day.  Taking vitamin and mineral supplements as recommended by your health care provider. What happens during an annual well check? The services and screenings done by your health care provider during your annual well check will depend on your age, overall health, lifestyle risk factors, and family history of disease. Counseling  Your health care provider may ask you questions about your:  Alcohol use.  Tobacco use.  Drug use.  Emotional well-being.  Home and relationship well-being.  Sexual activity.  Eating  habits.  History of falls.  Memory and ability to understand (cognition).  Work and work Statistician.  Reproductive health. Screening  You may have the following tests or measurements:  Height, weight, and BMI.  Blood pressure.  Lipid and cholesterol levels. These may be checked every 5 years, or more frequently if you are over 28 years old.  Skin check.  Lung cancer screening. You may have this screening every year starting at age 90 if you have a 30-pack-year history of smoking and currently smoke or have quit within the past 15 years.  Fecal occult blood test (FOBT) of the stool. You may have this test every year starting at age 74.  Flexible sigmoidoscopy or colonoscopy. You may have a sigmoidoscopy every 5 years or a colonoscopy every 10 years starting at age 10.  Hepatitis C blood test.  Hepatitis B blood test.  Sexually transmitted disease (STD) testing.  Diabetes screening. This is done by checking your blood sugar (glucose) after you have not eaten for a while (fasting). You may have this done every 1-3 years.  Bone density scan. This is done to screen for osteoporosis. You may have this done starting at age 28.  Mammogram. This may be done every 1-2 years. Talk to your health care provider about how often you should have regular mammograms. Talk with your health care provider about your test results, treatment options, and if necessary, the need for more tests. Vaccines  Your health care provider may recommend certain vaccines, such as:  Influenza vaccine. This is recommended every year.  Tetanus, diphtheria, and acellular pertussis (Tdap, Td) vaccine. You may need a Td booster every 10 years.  Zoster vaccine. You may need this after age 31.  Pneumococcal 13-valent conjugate (PCV13) vaccine. One dose is recommended after age 56.  Pneumococcal polysaccharide (PPSV23) vaccine. One dose is recommended after age 78. Talk to your health care provider about which  screenings and vaccines you need and how often you need them. This information is not intended to replace advice given to you by your health care provider. Make sure you discuss any questions you have with your health care provider. Document Released: 01/25/2015 Document Revised: 09/18/2015 Document Reviewed: 10/30/2014 Elsevier Interactive Patient Education  2017 [place] Prevention in the Home Falls can cause injuries. They can happen to people of all ages. There are many things you can do to make your home safe and to help prevent falls. What can I do on the outside of my home?  Regularly fix the edges of walkways and driveways and fix any cracks.  Remove anything that might make you trip as you walk through a door, such as a raised step or threshold.  Trim any bushes or trees on the path to your home.  Use bright outdoor lighting.  Clear any walking paths of anything that might make someone trip, such as rocks or tools.  Regularly check to see if handrails are loose or broken. Make sure that both sides of any steps have handrails.  Any raised decks and porches should have guardrails on the edges.  Have any leaves, snow, or ice cleared regularly.  Use sand or salt on walking paths during winter.  Clean up any spills in your garage right away. This includes oil or grease spills. What can I do in the bathroom?  Use night lights.  Install grab bars by the toilet and in the tub and shower. Do not use towel bars as grab bars.  Use non-skid mats or decals in the tub or shower.  If you need to sit down in the shower, use a plastic, non-slip stool.  Keep the floor dry. Clean up any water that spills on the floor as soon as it happens.  Remove soap buildup in the tub or shower regularly.  Attach bath mats securely with double-sided non-slip rug tape.  Do not have throw rugs and other things on the floor that can make you trip. What can I do in the bedroom?  Use  night lights.  Make sure that you have a light by your bed that is easy to reach.  Do not use any sheets or blankets that are too big for your bed. They should not hang down onto the floor.  Have a firm chair that has side arms. You can use this for support while you get dressed.  Do not have throw rugs and other things on the floor that can make you trip. What can I do in the kitchen?  Clean up any spills right away.  Avoid walking on wet floors.  Keep items that you use a lot in easy-to-reach places.  If you need to reach something above you, use a strong step stool that has a grab bar.  Keep electrical cords out of the way.  Do not use floor polish or wax that makes floors slippery. If you must use wax, use non-skid floor wax.  Do not have throw rugs and other things on the floor that can make you trip. What can I do with my stairs?  Do not leave any items on the stairs.  Make sure that there are  handrails on both sides of the stairs and use them. Fix handrails that are broken or loose. Make sure that handrails are as long as the stairways.  Check any carpeting to make sure that it is firmly attached to the stairs. Fix any carpet that is loose or worn.  Avoid having throw rugs at the top or bottom of the stairs. If you do have throw rugs, attach them to the floor with carpet tape.  Make sure that you have a light switch at the top of the stairs and the bottom of the stairs. If you do not have them, ask someone to add them for you. What else can I do to help prevent falls?  Wear shoes that:  Do not have high heels.  Have rubber bottoms.  Are comfortable and fit you well.  Are closed at the toe. Do not wear sandals.  If you use a stepladder:  Make sure that it is fully opened. Do not climb a closed stepladder.  Make sure that both sides of the stepladder are locked into place.  Ask someone to hold it for you, if possible.  Clearly mark and make sure that you  can see:  Any grab bars or handrails.  First and last steps.  Where the edge of each step is.  Use tools that help you move around (mobility aids) if they are needed. These include:  Canes.  Walkers.  Scooters.  Crutches.  Turn on the lights when you go into a dark area. Replace any light bulbs as soon as they burn out.  Set up your furniture so you have a clear path. Avoid moving your furniture around.  If any of your floors are uneven, fix them.  If there are any pets around you, be aware of where they are.  Review your medicines with your doctor. Some medicines can make you feel dizzy. This can increase your chance of falling. Ask your doctor what other things that you can do to help prevent falls. This information is not intended to replace advice given to you by your health care provider. Make sure you discuss any questions you have with your health care provider. Document Released: 10/25/2008 Document Revised: 06/06/2015 Document Reviewed: 02/02/2014 Elsevier Interactive Patient Education  2017 [employer].

## 2018-09-29 NOTE — Progress Notes (Signed)
Subjective:   Stephanie Shea is a 85 y.o. female who presents for Medicare Annual (Subsequent) preventive examination in clinic [place].  Cardiac Risk Factors include: advanced age (>87men, >17 women);dyslipidemia;hypertension;obesity (BMI >30kg/m2);family history of premature cardiovascular disease(mother died at age of 40 of CVA, father died at age 71 of heart attack)     Objective:     Vitals: BP 130/78   Pulse 76   Temp (!) 97.3 F (36.3 C)   Resp 18   Ht 5\' 7"  (1.702 m)   Wt 189 lb 3.2 oz (85.8 kg)   SpO2 96%   BMI 29.63 kg/m   Body mass index is 29.63 kg/m.  Advanced Directives 08/19/2018 05/20/2018 08/31/2017 06/22/2017 06/22/2017 03/12/2017 09/08/2016  Does Patient Have a Medical Advance Directive? Yes Yes Yes - Yes Yes Yes  Type of Advance Directive [place];Out of facility DNR (pink MOST or yellow form) [place];Out of facility DNR (pink MOST or yellow form) [place];Out of facility DNR (pink MOST or yellow form) - [place];Living will Healthcare Power of [place];Living will;Out of facility DNR (pink MOST or yellow form)  Does patient want to make changes to medical advance directive? No - Patient declined No - Patient declined No - Patient declined Yes (MAU/Ambulatory/Procedural Areas - Information given) No - Patient declined No - Patient declined -  Copy of [place] in Chart? Yes - validated most recent copy scanned in chart (See row information) Yes - validated most recent copy scanned in chart (See row information) Yes - Yes Yes No - copy requested  Pre-existing out of facility DNR order (yellow form or pink MOST form) Yellow form placed in chart (order not valid for inpatient use) Yellow form placed in chart (order not valid for inpatient use);Pink MOST form placed in chart (order not valid for inpatient use) Yellow form placed in chart  (order not valid for inpatient use);Pink MOST form placed in chart (order not valid for inpatient use) - - - Yellow form placed in chart (order not valid for inpatient use)    Tobacco Social History   Tobacco Use  Smoking Status Never Smoker  Smokeless Tobacco Never Used     Counseling given: Not Answered   Clinical Intake:  Pre-visit preparation completed: Yes  Pain : No/denies pain     Nutritional Status: BMI 25 -29 Overweight Nutritional Risks: None Diabetes: No  How often do you need to have someone help you when you read instructions, pamphlets, or other written materials from your doctor or pharmacy?: 1 - Never What is the last grade level you completed in school?: college degree  Interpreter Needed?: No  Information entered by :: Kenneshia Rehm Bretta Bang NP  Past Medical History:  Diagnosis Date  . Atherosclerosis of native coronary artery 2001   LAD & Cx disease -- BMS PCI in 2001; CABG 2002.  PCI in 2006 (per-op TAH)  . Breast cancer ([place])   . Cervix dysplasia   . GERD (gastroesophageal reflux disease)   . Heart attack ([place]) 2002, 2006   Both events reportedly were perioperatively. She has no recollection. Initial PCI was related to angina symptoms (bilateral arm pain)  . High cholesterol   . Hypertension   . Lumbosacral spinal stenosis   . Osteoarthritis   . Skin cancer   . Sleep apnea    Uses CPAP faithfully   Past Surgical History:  Procedure Laterality  Date  . ABDOMINAL HYSTERECTOMY    . bone density    . CARDIAC CATHETERIZATION  2001    Dr. Glade Lloyd [facility]) 2 vessel CAD involving LAD-D1 and distal Cx-OM. Appearance of dilated tapered left main with no significant atherosclerosis on IVUS --  BMS PCI of pLAD ([place] BMS 3.0 mm x 15 mm) & with PTCA of ostial D1, PTCA of dLAD.  BMS PCI pCx [facility] BMS 3.5 mm x 11 mm) - unable to reopen dCx-OM (probable distal Atheroembolic occlusion.  Normal LVEF.  Marland Kitchen COLONOSCOPY    . CORONARY ARTERY BYPASS GRAFT  2002    Unsure of details [facility], in [facility])  . CORONARY STENT INTERVENTION  2001   ([place]) [place] BMS PCI pLAD (3.0 mm x 15 mm) -PTCA of jailed D1 as well as distal LAD; pCx ([place] BMS 3.5 mm x 11 mm) -unable to open distalCx-OM -thromboembolic occlusion  . DIAGNOSTIC MAMMOGRAM    . left knee replacement    . MASTECTOMY    . NM MYOVIEW LTD  12/2014; 2018   Cardiologist (Dr. Kellie Simmering.  [place], [place] ph# [phone]) -->LEXISCAN: Nonischemic, "normal "  . pap smear    . tonsilectomy    . TRANSTHORACIC ECHOCARDIOGRAM  12/2014   Echocardiogram 12/25/14- normal LVEF and systolic function, EF 99991111, mild grade 1 diastolic dysfunction   Family History  Problem Relation Age of Onset  . Stroke Mother   . Heart disease Father    Social History   Socioeconomic History  . Marital status: Widowed    Spouse name: Not on file  . Number of children: Not on file  . Years of education: Not on file  . Highest education level: Not on file  Occupational History  . Not on file  Social Needs  . Financial resource strain: Not hard at all  . Food insecurity    Worry: Never true    Inability: Never true  . Transportation needs    Medical: No    Non-medical: No  Tobacco Use  . Smoking status: Never Smoker  . Smokeless tobacco: Never Used  Substance and Sexual Activity  . Alcohol use: No  . Drug use: No  . Sexual activity: Not Currently  Lifestyle  . Physical activity    Days per week: 7 days    Minutes per session: 60 min  . Stress: Only a little  Relationships  . Social connections    Talks on phone: More than three times a week    Gets together: More than three times a week    Attends religious service: More than 4 times per year    Active member of club or organization: No    Attends meetings of clubs or organizations: Never    Relationship status: Widowed  Other Topics Concern  . Not on file  Social History Narrative   Recently moved back to [place] (lives alone in a  [place] apartment at [facility])   Do you drink/eat things with caffeine? 2 cups of coffee every day   What year were you married? [place] - twice widowed   No pets.      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 Has POA      Primary Emergency Contact: [id number]   Address: [address] [place],  [place] [address]  Home Phone: [phone]    Outpatient Encounter Medications as of 09/29/2018  Medication Sig  . acetaminophen (TYLENOL) 500 MG tablet Take 500 mg by mouth at bedtime as needed.  Marland Kitchen aspirin EC 81 MG tablet Take 81 mg by mouth at bedtime.   Marland Kitchen atorvastatin (LIPITOR) 80 MG tablet Take 1 tablet (80 mg total) by mouth daily.  . Cholecalciferol 1000 units capsule Take 2,000 Units by mouth daily.  . enalapril (VASOTEC) 5 MG tablet TAKE 1 TABLET BY MOUTH EVERY DAY  . fexofenadine (ALLEGRA) 180 MG tablet Take 180 mg by mouth daily.  . fluticasone (FLONASE) 50 MCG/ACT nasal spray Place 1 spray into both nostrils as needed for allergies or rhinitis.  . meloxicam (MOBIC) 7.5 MG tablet TAKE 1 TABLET BY MOUTH EVERY DAY  . metoprolol (TOPROL-XL) 200 MG 24 hr tablet Take 1 tablet (200 mg total) by mouth daily.  . nitroGLYCERIN (NITROSTAT) 0.4 MG SL tablet PLACE 1 TABLET UNDER TONGUE EVERY 5 MINUTES FOR 3 DOSES FOR CHEST PAIN  . omeprazole (PRILOSEC) 20 MG capsule Take 20 mg by mouth as needed.   . Wheat Dextrin (BENEFIBER) POWD 2 spoonful mixed in water 3 times a day  . Zoster Vaccine Adjuvanted [facility]) injection Inject 0.5 mLs into the muscle once for 1 dose. Second dose  . [DISCONTINUED] Zoster Vaccine Adjuvanted [facility]) injection Inject 0.5 mLs into the muscle once. Second dose   No facility-administered encounter medications on file as of 09/29/2018.     Activities of Daily Living In your present state of health, do you have any difficulty performing the following  activities: 09/29/2018  Hearing? N  Vision? N  Difficulty concentrating or making decisions? N  Walking or climbing stairs? N  Dressing or bathing? N  Doing errands, shopping? N  Preparing Food and eating ? N  Using the Toilet? N  In the past six months, have you accidently leaked urine? N  Do you have problems with loss of bowel control? N  Managing your Medications? N  Managing your Finances? N  Housekeeping or managing your Housekeeping? N  Some recent data might be hidden    Patient Care Team: Terrel Manalo X, NP as PCP - General (Internal Medicine) Leonie Josslin Sanjuan, MD as PCP - Cardiology (Cardiology) Gretchen Weinfeld X, NP as Nurse Practitioner (Internal Medicine)    Assessment:   This is a routine wellness examination for [place].  Exercise Activities and Dietary recommendations Current Exercise Habits: Home exercise routine;Structured exercise class, Type of exercise: walking, Time (Minutes): 30, Frequency (Times/Week): 7, Weekly Exercise (Minutes/Week): 210, Intensity: Moderate, Exercise limited by: cardiac condition(s)  Goals    . DIET - REDUCE PORTION SIZE    . DIET - REDUCE SUGAR INTAKE    . Increase physical activity       Fall Risk Fall Risk  08/19/2018 05/20/2018 03/12/2017  Falls in the past year? 0 0 No  Number falls in past yr: 0 0 -  Injury with Fall? 0 0 -   Is the patient's home free of loose throw rugs in walkways, pet beds, electrical cords, etc?   none      Grab bars in the bathroom? yes      Handrails on the stairs?   yes      Adequate lighting?   yes  Timed Get Up and Go performed: 6 seconds.   Depression Screen PHQ 2/9 Scores 08/19/2018 03/12/2017  PHQ - 2 Score 0 0  PHQ- 9 Score 0 -  Cognitive Function MMSE - Mini Mental State Exam 09/29/2018 12/30/2017 12/01/2016 12/01/2016  Not completed: (No Data) (No Data) - (No Data)  Orientation to time 4 4 5 5   Orientation to Place 5 5 5 5   Registration 3 3 3 3   Attention/ Calculation 5 5 5 5   Recall 3 3 3 3    Language- name 2 objects 2 2 2 2   Language- repeat 1 1 1 1   Language- follow 3 step command 3 1 3 3   Language- read & follow direction 1 1 1 1   Write a sentence 1 1 1 1   Copy design 1 1 1 1   Total score 29 27 30 30         Immunization History  Administered Date(s) Administered  . Influenza, High Dose Seasonal PF 10/21/2016, 09/24/2017  . Influenza-Unspecified 01/13/2016  . Pneumococcal-Unspecified 01/13/2016    Qualifies for Shingles Vaccine? Singrix, had the first dose, the 2nd dose pending.   Screening Tests Health Maintenance  Topic Date Due  . FOOT EXAM  10/27/1943  . OPHTHALMOLOGY EXAM  10/27/1943  . TETANUS/TDAP  10/28/1954  . PNA vac Low Risk Adult (2 of 2 - PCV13) 01/12/2017  . INFLUENZA VACCINE  08/13/2018  . HEMOGLOBIN A1C  02/16/2019  . DEXA SCAN  Completed    Cancer Screenings: Lung: Low Dose CT Chest recommended if Age 68-80 years, 30 pack-year currently smoking OR have quit w/in 15years. Patient does not  qualify. Breast:  Up to date on Mammogram? A year ago Up to date of Bone Density/Dexa? due Colorectal: does not qualify.   Additional Screenings: none Hepatitis C Screening:      Plan:    DEXA order sent in. Pending 2nd dose of Singrix.  I have personally reviewed and noted the following in the patient's chart:   . Medical and social history . Use of alcohol, tobacco or illicit drugs  . Current medications and supplements . Functional ability and status . Nutritional status . Physical activity . Advanced directives . List of other physicians . Hospitalizations, surgeries, and ER visits in previous 12 months . Vitals . Screenings to include cognitive, depression, and falls . Referrals and appointments  In addition, I have reviewed and discussed with patient certain preventive protocols, quality metrics, and best practice recommendations. A written personalized care plan for preventive services as well as general preventive health  recommendations were provided to patient.     Opie Fanton X Lidia Clavijo, NP  09/30/2018

## 2018-09-30 DIAGNOSIS — Z780 Asymptomatic menopausal state: Secondary | ICD-10-CM | POA: Insufficient documentation

## 2018-09-30 MED ORDER — ZOSTER VAC RECOMB ADJUVANTED 50 MCG/0.5ML IM SUSR: 0.5000 mL | Freq: Once | INTRAMUSCULAR | 0 refills | Status: AC

## 2018-09-30 NOTE — Assessment & Plan Note (Signed)
DEXA 

## 2018-10-31 ENCOUNTER — Other Ambulatory Visit: Payer: Self-pay | Admitting: Family Medicine

## 2018-11-15 ENCOUNTER — Other Ambulatory Visit: Payer: Self-pay | Admitting: Internal Medicine

## 2018-12-13 DIAGNOSIS — B351 Tinea unguium: Secondary | ICD-10-CM | POA: Diagnosis not present

## 2018-12-13 DIAGNOSIS — E1159 Type 2 diabetes mellitus with other circulatory complications: Secondary | ICD-10-CM | POA: Diagnosis not present

## 2018-12-13 DIAGNOSIS — L84 Corns and callosities: Secondary | ICD-10-CM | POA: Diagnosis not present

## 2018-12-20 DIAGNOSIS — L570 Actinic keratosis: Secondary | ICD-10-CM | POA: Diagnosis not present

## 2018-12-20 DIAGNOSIS — Z85828 Personal history of other malignant neoplasm of skin: Secondary | ICD-10-CM | POA: Diagnosis not present

## 2018-12-20 DIAGNOSIS — L853 Xerosis cutis: Secondary | ICD-10-CM | POA: Diagnosis not present

## 2018-12-20 DIAGNOSIS — L821 Other seborrheic keratosis: Secondary | ICD-10-CM | POA: Diagnosis not present

## 2018-12-20 DIAGNOSIS — L814 Other melanin hyperpigmentation: Secondary | ICD-10-CM | POA: Diagnosis not present

## 2018-12-21 ENCOUNTER — Other Ambulatory Visit: Payer: Self-pay | Admitting: Internal Medicine

## 2018-12-21 DIAGNOSIS — M159 Polyosteoarthritis, unspecified: Secondary | ICD-10-CM

## 2018-12-22 ENCOUNTER — Other Ambulatory Visit: Payer: Self-pay

## 2018-12-22 ENCOUNTER — Other Ambulatory Visit: Payer: Medicare Other

## 2018-12-22 DIAGNOSIS — R7303 Prediabetes: Secondary | ICD-10-CM

## 2018-12-22 DIAGNOSIS — I10 Essential (primary) hypertension: Secondary | ICD-10-CM

## 2018-12-22 DIAGNOSIS — E785 Hyperlipidemia, unspecified: Secondary | ICD-10-CM

## 2018-12-23 LAB — CBC WITH DIFFERENTIAL/PLATELET
Absolute Monocytes: 500 cells/uL (ref 200–950)
Basophils Absolute: 10 cells/uL (ref 0–200)
Basophils Relative: 0.2 %
Eosinophils Absolute: 20 cells/uL (ref 15–500)
Eosinophils Relative: 0.4 %
HCT: 37.2 % (ref 35.0–45.0)
Hemoglobin: 12.6 g/dL (ref 11.7–15.5)
Lymphs Abs: 1525 cells/uL (ref 850–3900)
MCH: 32 pg (ref 27.0–33.0)
MCHC: 33.9 g/dL (ref 32.0–36.0)
MCV: 94.4 fL (ref 80.0–100.0)
MPV: 10.9 fL (ref 7.5–12.5)
Monocytes Relative: 9.8 %
Neutro Abs: 3045 cells/uL (ref 1500–7800)
Neutrophils Relative %: 59.7 %
Platelets: 164 10*3/uL (ref 140–400)
RBC: 3.94 10*6/uL (ref 3.80–5.10)
RDW: 12.6 % (ref 11.0–15.0)
Total Lymphocyte: 29.9 %
WBC: 5.1 10*3/uL (ref 3.8–10.8)

## 2018-12-23 LAB — COMPLETE METABOLIC PANEL WITH GFR
AG Ratio: 1.4 (calc) (ref 1.0–2.5)
ALT: 20 U/L (ref 6–29)
AST: 20 U/L (ref 10–35)
Albumin: 4 g/dL (ref 3.6–5.1)
Alkaline phosphatase (APISO): 57 U/L (ref 37–153)
BUN: 18 mg/dL (ref 7–25)
CO2: 27 mmol/L (ref 20–32)
Calcium: 9.4 mg/dL (ref 8.6–10.4)
Chloride: 104 mmol/L (ref 98–110)
Creat: 0.86 mg/dL (ref 0.60–0.88)
GFR, Est African American: 72 mL/min/{1.73_m2} (ref >=60)
GFR, Est Non African American: 62 mL/min/{1.73_m2} (ref >=60)
Globulin: 2.8 g/dL (calc) (ref 1.9–3.7)
Glucose, Bld: 107 mg/dL — ABNORMAL HIGH (ref 65–99)
Potassium: 4.3 mmol/L (ref 3.5–5.3)
Sodium: 140 mmol/L (ref 135–146)
Total Bilirubin: 0.8 mg/dL (ref 0.2–1.2)
Total Protein: 6.8 g/dL (ref 6.1–8.1)

## 2018-12-23 LAB — HEMOGLOBIN A1C
Hgb A1c MFr Bld: 6.3 % of total Hgb — ABNORMAL HIGH (ref <5.7)
Mean Plasma Glucose: 134 (calc)
eAG (mmol/L): 7.4 (calc)

## 2018-12-23 LAB — LIPID PANEL
Cholesterol: 163 mg/dL (ref <200)
HDL: 42 mg/dL — ABNORMAL LOW (ref >=50)
LDL Cholesterol (Calc): 95 mg/dL (calc)
Non-HDL Cholesterol (Calc): 121 mg/dL (calc) (ref <130)
Total CHOL/HDL Ratio: 3.9 (calc) (ref <5.0)
Triglycerides: 166 mg/dL — ABNORMAL HIGH (ref <150)

## 2018-12-26 ENCOUNTER — Other Ambulatory Visit: Payer: Self-pay | Admitting: Cardiology

## 2018-12-26 NOTE — Telephone Encounter (Signed)
Rx request sent to pharmacy.  

## 2018-12-30 ENCOUNTER — Encounter: Payer: Self-pay | Admitting: Internal Medicine

## 2018-12-30 ENCOUNTER — Encounter: Payer: Medicare Other | Admitting: Internal Medicine

## 2018-12-30 ENCOUNTER — Other Ambulatory Visit: Payer: Self-pay

## 2019-01-01 ENCOUNTER — Telehealth: Payer: Self-pay | Admitting: Nurse Practitioner

## 2019-01-01 NOTE — Telephone Encounter (Signed)
pts daughter and son both called to notify that pt was planning on getting COVID tested due to possible exposure- several positive cases on the hall of her residents. Told them I would fwd to PCP.

## 2019-01-02 ENCOUNTER — Telehealth: Payer: Self-pay | Admitting: *Deleted

## 2019-01-02 NOTE — Progress Notes (Signed)
A user error has taken place.

## 2019-01-02 NOTE — Telephone Encounter (Signed)
D/W the Patient. She is completely Asymptomatic. Denies any Coughing ,Nausea SOB. She will call us if anything changes. Will quarantine in her apartment for now

## 2019-01-02 NOTE — Telephone Encounter (Signed)
Patient called and stated that she is POSITIVE for Covid 19 and wants to speak with Dr. Lyndel Safe regarding this. Wants Dr. Lyndel Safe to call her at [phone]

## 2019-01-20 ENCOUNTER — Other Ambulatory Visit: Payer: Self-pay

## 2019-01-20 ENCOUNTER — Telehealth: Payer: Self-pay

## 2019-01-20 ENCOUNTER — Encounter: Payer: Medicare Other | Admitting: Internal Medicine

## 2019-01-20 NOTE — Telephone Encounter (Signed)
Called patient to reschedule apnt. She states she has had a spot on her left ankle for the last month that is red and square shaped. She believes it to be circulatory related and will call [place] as well. Next apnt is the 22nd in the clinic. To Dr. Lyndel Safe for [place].

## 2019-02-03 ENCOUNTER — Other Ambulatory Visit: Payer: Self-pay

## 2019-02-03 ENCOUNTER — Encounter: Payer: Self-pay | Admitting: Internal Medicine

## 2019-02-03 ENCOUNTER — Non-Acute Institutional Stay: Payer: Medicare Other | Admitting: Internal Medicine

## 2019-02-03 VITALS — BP 140/90 | HR 70 | Temp 97.1°F | Ht 67.0 in | Wt 183.2 lb

## 2019-02-03 DIAGNOSIS — R600 Localized edema: Secondary | ICD-10-CM

## 2019-02-03 DIAGNOSIS — Z13820 Encounter for screening for osteoporosis: Secondary | ICD-10-CM

## 2019-02-03 DIAGNOSIS — E785 Hyperlipidemia, unspecified: Secondary | ICD-10-CM

## 2019-02-03 DIAGNOSIS — Z9989 Dependence on other enabling machines and devices: Secondary | ICD-10-CM

## 2019-02-03 DIAGNOSIS — I10 Essential (primary) hypertension: Secondary | ICD-10-CM | POA: Diagnosis not present

## 2019-02-03 DIAGNOSIS — U071 COVID-19: Secondary | ICD-10-CM

## 2019-02-03 DIAGNOSIS — R739 Hyperglycemia, unspecified: Secondary | ICD-10-CM

## 2019-02-03 DIAGNOSIS — Z853 Personal history of malignant neoplasm of breast: Secondary | ICD-10-CM

## 2019-02-03 DIAGNOSIS — G4733 Obstructive sleep apnea (adult) (pediatric): Secondary | ICD-10-CM

## 2019-02-03 DIAGNOSIS — I25810 Atherosclerosis of coronary artery bypass graft(s) without angina pectoris: Secondary | ICD-10-CM | POA: Diagnosis not present

## 2019-02-03 NOTE — Progress Notes (Signed)
Location:  [facility] of Service:  Clinic (12)  Provider:   Code Status: Goals of Care:  Advanced Directives 08/19/2018  Does Patient Have a Medical Advance Directive? Yes  Type of Paramedic of [place];Out of facility DNR (pink MOST or yellow form)  Does patient want to make changes to medical advance directive? No - Patient declined  Copy of [place] in Chart? Yes - validated most recent copy scanned in chart (See row information)  Pre-existing out of facility DNR order (yellow form or pink MOST form) Yellow form placed in chart (order not valid for inpatient use)     Chief Complaint  Patient presents with  . Medical Management of Chronic Issues    4 month follow up. Patiet complains of left leg swelling and redness. Its been 3 weeks since released from isolation after being diagnosed with covid.    HPI: Patient is a 86 y.o. female seen today for medical management of chronic diseases.   Her active Problems are  Recent Covid diagnosis.   She stayed in quarantine for 2 weeks.  Did not have any active complaints.  Her only symptom was weakness.  Wants to know if she can get her first dose of Covid vaccine  CAD S/P PTCA in 2001 and 2006 S/P CABG in 2002, Last Echo showed EF of 55%, Stress Test was normal in 2016 She has appointment to follow-up with cardiology She states she had one episode in which she had to use nitro as she felt little discomfort.  This is the first time in the past 2 to 3 years.  It can be related to Covid diagnosis She is going to mention it to her cardiologist  Redness in her Left leg Does have some edema in her lower leg  She noticed some redness in that area History of breast cancer status postmastectomy Due for her mammogram History of skin cancer Following closely with her dermatology  She also has a history of hyperlipidemia GERD, Allergic Rhinitis, Arthritis s/p Left Knee  Surgery,H/o Skin Cancer, Hypertension,   Past Medical History:  Diagnosis Date  . Atherosclerosis of native coronary artery 2001   LAD & Cx disease -- BMS PCI in 2001; CABG 2002.  PCI in 2006 (per-op TAH)  . Breast cancer ([place])   . Cervix dysplasia   . GERD (gastroesophageal reflux disease)   . Heart attack ([place]) 2002, 2006   Both events reportedly were perioperatively. She has no recollection. Initial PCI was related to angina symptoms (bilateral arm pain)  . High cholesterol   . Hypertension   . Lumbosacral spinal stenosis   . Osteoarthritis   . Skin cancer   . Sleep apnea    Uses CPAP faithfully    Past Surgical History:  Procedure Laterality Date  . ABDOMINAL HYSTERECTOMY    . bone density    . CARDIAC CATHETERIZATION  2001    Dr. Glade Lloyd [facility]) 2 vessel CAD involving LAD-D1 and distal Cx-OM. Appearance of dilated tapered left main with no significant atherosclerosis on IVUS --  BMS PCI of pLAD ([facility] BMS 3.0 mm x 15 mm) & with PTCA of ostial D1, PTCA of dLAD.  BMS PCI pCx [facility] BMS 3.5 mm x 11 mm) - unable to reopen dCx-OM (probable distal Atheroembolic occlusion.  Normal LVEF.  Marland Kitchen COLONOSCOPY    . CORONARY ARTERY BYPASS GRAFT  2002   Unsure of details [facility])  .  CORONARY STENT INTERVENTION  2001   ([place]) [facility] BMS PCI pLAD (3.0 mm x 15 mm) -PTCA of jailed D1 as well as distal LAD; pCx ([facility] BMS 3.5 mm x 11 mm) -unable to open distalCx-OM -thromboembolic occlusion  . DIAGNOSTIC MAMMOGRAM    . left knee replacement    . MASTECTOMY    . NM MYOVIEW LTD  12/2014; 2018   Cardiologist (Dr. Kellie Simmering.  [place], [place] ph# [phone]) -->LEXISCAN: Nonischemic, "normal "  . pap smear    . tonsilectomy    . TRANSTHORACIC ECHOCARDIOGRAM  12/2014   Echocardiogram 12/25/14- normal LVEF and systolic function, EF 99991111, mild grade 1 diastolic dysfunction    Allergies  Allergen Reactions  . Codeine Nausea Only    Outpatient Encounter Medications  as of 02/03/2019  Medication Sig  . acetaminophen (TYLENOL) 500 MG tablet Take 500 mg by mouth at bedtime as needed.  Marland Kitchen aspirin EC 81 MG tablet Take 81 mg by mouth at bedtime.   Marland Kitchen atorvastatin (LIPITOR) 80 MG tablet Take 1 tablet (80 mg total) by mouth daily.  . enalapril (VASOTEC) 5 MG tablet TAKE 1 TABLET DAILY  . fexofenadine (ALLEGRA) 180 MG tablet Take 180 mg by mouth daily.  . fluticasone (FLONASE) 50 MCG/ACT nasal spray Place 1 spray into both nostrils as needed for allergies or rhinitis.  . meloxicam (MOBIC) 7.5 MG tablet TAKE 1 TABLET DAILY  . metoprolol (TOPROL-XL) 200 MG 24 hr tablet TAKE 1 TABLET BY MOUTH EVERY DAY  . nitroGLYCERIN (NITROSTAT) 0.4 MG SL tablet PLACE 1 TABLET UNDER TONGUE EVERY 5 MINUTES FOR 3 DOSES FOR CHEST PAIN  . omeprazole (PRILOSEC) 20 MG capsule Take 20 mg by mouth as needed.   . Wheat Dextrin (BENEFIBER) POWD 2 spoonful mixed in water 3 times a day  . [DISCONTINUED] Cholecalciferol 1000 units capsule Take 2,000 Units by mouth daily.   No facility-administered encounter medications on file as of 02/03/2019.    Review of Systems:  Review of Systems  Constitutional: Positive for activity change.  HENT: Negative.   Respiratory: Negative.   Cardiovascular: Positive for leg swelling.  Gastrointestinal: Negative.   Genitourinary: Negative.   Musculoskeletal: Negative.   Skin: Negative.   Neurological: Positive for weakness.  Psychiatric/Behavioral: Negative.      Health Maintenance  Topic Date Due  . FOOT EXAM  10/27/1943  . OPHTHALMOLOGY EXAM  10/27/1943  . TETANUS/TDAP  10/28/1954  . PNA vac Low Risk Adult (2 of 2 - PCV13) 01/12/2017  . INFLUENZA VACCINE  08/13/2018  . HEMOGLOBIN A1C  06/21/2019  . DEXA SCAN  Completed    Physical Exam: Vitals:   02/03/19 1149  BP: 140/90  Pulse: 70  Temp: (!) 97.1 F (36.2 C)  SpO2: 97%  Weight: 183 lb 3.2 oz (83.1 kg)  Height: 5\' 7"  (1.702 m)   Body mass index is 28.69 kg/m. Physical  Exam Constitutional: Oriented to person, place, and time. Well-developed and well-nourished.  HENT:  Head: Normocephalic.  Mouth/Throat: Oropharynx is clear and moist.  Eyes: Pupils are equal, round, and reactive to light.  Neck: Neck supple.  Cardiovascular: Normal rate and normal heart sounds.  No murmur heard. Pulmonary/Chest: Effort normal and breath sounds normal. No respiratory distress. No wheezes. She has no rales.  Abdominal: Soft. Bowel sounds are normal. No distension. There is no tenderness. There is no rebound.  Musculoskeletal: Has Mild edema Bilateral  Mostly dry skin No Signs of infection  Lymphadenopathy: none Neurological: Alert and oriented  to person, place, and time.  Skin: Skin is warm and dry.  Psychiatric: Normal mood and affect. Behavior is normal. Thought content normal.   Labs reviewed: Basic Metabolic Panel: Recent Labs    08/16/18 0730 12/21/18 1438  NA 142 140  K 4.3 4.3  CL 105 104  CO2 29 27  GLUCOSE 102* 107*  BUN 15 18  CREATININE 0.82 0.86  CALCIUM 10.1 9.4  TSH 2.35  --    Liver Function Tests: Recent Labs    08/16/18 0730 12/21/18 1438  AST 17 20  ALT 18 20  BILITOT 0.9 0.8  PROT 7.2 6.8   No results for input(s): LIPASE, AMYLASE in the last 8760 hours. No results for input(s): AMMONIA in the last 8760 hours. CBC: Recent Labs    08/16/18 0730 12/21/18 1438  WBC 6.7 5.1  NEUTROABS 3,558 3,045  HGB 13.0 12.6  HCT 39.3 37.2  MCV 96.1 94.4  PLT 229 164   Lipid Panel: Recent Labs    08/16/18 0730 12/21/18 1438  CHOL 188 163  HDL 53 42*  LDLCALC 99 95  TRIG 240* 166*  CHOLHDL 3.5 3.9   Lab Results  Component Value Date   HGBA1C 6.3 (H) 12/21/2018    Procedures since last visit: No results found.  Assessment/Plan Lab test positive for detection of COVID-19 virus Is asymptomatic. Finished quarantine for 2 weeks I have talked to the facility nurse and she would be eligible to get her first shot in few  weeks  Essential hypertension Blood pressure is controlled on enalapril Her renal function is stable  Coronary artery disease  Follows with cardiology And is on aspirin statin ACE inhibitor and beta-blocker  Hyperlipidemia with target low density lipoprotein (LDL) cholesterol less than 70 mg/dL LDL is 95 with slight decrease in HDL Patient states she has not been very active Will discuss with her cardiologist History of breast cancer in female Due for repeat mammogram OSA on CPAP Tolerating CPAP  Screening for osteoporosis She has orders for her DEXA scan  Bilateral leg edema Mild edema continue TED hoses Need to use the cream for her dry skin  Hyperglycemia A1c is stable  Arthritis Continue on Meloxicam PRN  H/O Skin Cancer Patient Due for Follow up with Dermatology  Labs/tests ordered:  * No order type specified * Next appt:  06/01/2019  Total time spent in this patient care encounter was  45_  minutes; greater than 50% of the visit spent counseling patient and staff, reviewing records , Labs and coordinating care for problems addressed at this encounter.

## 2019-02-07 ENCOUNTER — Other Ambulatory Visit: Payer: Self-pay | Admitting: Internal Medicine

## 2019-02-23 ENCOUNTER — Encounter: Payer: Self-pay | Admitting: Nurse Practitioner

## 2019-03-28 ENCOUNTER — Other Ambulatory Visit: Payer: Self-pay

## 2019-03-28 ENCOUNTER — Ambulatory Visit (INDEPENDENT_AMBULATORY_CARE_PROVIDER_SITE_OTHER): Payer: Medicare Other | Admitting: Cardiology

## 2019-03-28 ENCOUNTER — Encounter: Payer: Self-pay | Admitting: Cardiology

## 2019-03-28 VITALS — BP 135/60 | HR 82 | Temp 96.8°F | Ht 67.0 in | Wt 181.6 lb

## 2019-03-28 DIAGNOSIS — Z7189 Other specified counseling: Secondary | ICD-10-CM

## 2019-03-28 DIAGNOSIS — I25810 Atherosclerosis of coronary artery bypass graft(s) without angina pectoris: Secondary | ICD-10-CM | POA: Diagnosis not present

## 2019-03-28 DIAGNOSIS — I2510 Atherosclerotic heart disease of native coronary artery without angina pectoris: Secondary | ICD-10-CM

## 2019-03-28 DIAGNOSIS — I10 Essential (primary) hypertension: Secondary | ICD-10-CM | POA: Diagnosis not present

## 2019-03-28 DIAGNOSIS — E785 Hyperlipidemia, unspecified: Secondary | ICD-10-CM

## 2019-03-28 NOTE — Progress Notes (Signed)
Primary Care Provider: Mast, [name], NP Cardiologist: Glenetta Hew, MD Electrophysiologist: None  Clinic Note: Chief Complaint  Patient presents with  . Follow-up    Annual.  No symptoms.  . Coronary Artery Disease    No angina    HPI:    Stephanie Shea is a 86 y.o. female with a CAD history noted below who presents today for annual follow-up.  Stephanie Shea has moved back to [facility] after having retired down to [employer].  CAD history dates back to 2001 -- progressive exertional angina (bilateral arm aching. 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  2001 -- ([place], Dr. Glade Lloyd) Cath-PCI of pLAD & PTCA of ostial D1 (jailed), dLAD PTCA.  PCI of pCx, but unable to open dCx-OM.  IVIS of left main  ? Peri-op MI 2002 --> CABG ([place])  2006 [facility], peri-op MI for TAH -- PCI  Last STRESS TEST in 2018 by former Cardiologist (Dr. Kellie Simmering.  [place], [place] ph# [phone]) - no further action  Moved to [place].  Initially seen by me in January 2019.  Stable.  Stephanie Shea was last seen in February 2020 (prior to the COVID-19 lockdown).  She was doing great.  No concerns or issues.  Was thoroughly enjoying friends home.  Noted that she was putting on weight because of all the food.  She was doing cardio drumming and was thoroughly enjoying it.  Limited walking because of spinal stenosis issues.  Recent Hospitalizations: None  Reviewed  CV studies:    The following studies were reviewed today: (if available, images/films reviewed: From Epic Chart or Care Everywhere) . None:   Interval History:   Stephanie Shea is a very pleasant elderly woman here for routine follow-up doing very well.  She feels quite well.  Again she is still enjoying her time at [facility].  Unfortunately she is injured her arm and so she is having a hard time doing her cardio drumming.  She really has not any major symptoms besides 5 that she  says states she feels little bit more tired than usual.  She is having to go to bed earlier than she used to.  She used to stay up till 1 or 2 in the morning and then sleep little later.  She also notes that wearing the mask makes her more short of breath than usual. She is only used nitroglycerin once in the last 3 years.  She does note off-and-on flip-flopping in her chest, but nothing prolonged.  She sometimes gets dizzy when she first stands up.  CV Review of Symptoms (Summary): no chest pain or dyspnea on exertion positive for - edema and [place] more easily-more fatigue negative for - orthopnea, palpitations, paroxysmal nocturnal dyspnea, rapid heart rate, shortness of breath or Syncope/near syncope, TIA/amaurosis fugax  The patient does not have symptoms concerning for COVID-19 infection (fever, chills, cough, or new shortness of breath).  She is getting tired wearing a mask.  She has had both of her COVID-19 vaccines. The patient is practicing social distancing & Masking.    REVIEWED OF SYSTEMS   Review of Systems  Constitutional: Negative for malaise/fatigue (Just gets tired more easily usual.  Falls sleep earlier.) and weight loss.  HENT: Negative for congestion and nosebleeds.   Respiratory: Negative for shortness of breath.   Gastrointestinal: Negative for abdominal pain, blood in stool and melena.  Genitourinary: Negative for hematuria.  Musculoskeletal: Positive  for back pain (Spinal stenosis limits walking.) and joint pain.  Neurological: Negative for dizziness and weakness.  Psychiatric/Behavioral: Negative for depression and memory loss. The patient has insomnia (But falling asleep more than usual now.). The patient is not nervous/anxious.    She walks with a cane for balance.  I have reviewed and (if needed) personally updated the patient's problem list, medications, allergies, past medical and surgical history, social and family history.   PAST MEDICAL HISTORY   Past  Medical History:  Diagnosis Date  . Atherosclerosis of native coronary artery 2001   LAD & Cx disease -- BMS PCI in 2001; CABG 2002.  PCI in 2006 (per-op TAH)  . Breast cancer ([place])   . Cervix dysplasia   . GERD (gastroesophageal reflux disease)   . Heart attack ([place]) 2002, 2006   Both events reportedly were perioperatively. She has no recollection. Initial PCI was related to angina symptoms (bilateral arm pain)  . High cholesterol   . Hypertension   . Lumbosacral spinal stenosis   . Osteoarthritis   . Skin cancer   . Sleep apnea    Uses CPAP faithfully    PAST SURGICAL HISTORY   Past Surgical History:  Procedure Laterality Date  . ABDOMINAL HYSTERECTOMY    . bone density    . CARDIAC CATHETERIZATION  2001    Dr. Glade Lloyd [facility]) 2 vessel CAD involving LAD-D1 and distal Cx-OM. Appearance of dilated tapered left main with no significant atherosclerosis on IVUS --  BMS PCI of pLAD ([facility] BMS 3.0 mm x 15 mm) & with PTCA of ostial D1, PTCA of dLAD.  BMS PCI pCx [facility] BMS 3.5 mm x 11 mm) - unable to reopen dCx-OM (probable distal Atheroembolic occlusion.  Normal LVEF.  Marland Kitchen COLONOSCOPY    . CORONARY ARTERY BYPASS GRAFT  2002   Unsure of details [facility])  . CORONARY STENT INTERVENTION  2001   ([place]) [facility] BMS PCI pLAD (3.0 mm x 15 mm) -PTCA of jailed D1 as well as distal LAD; pCx ([facility] BMS 3.5 mm x 11 mm) -unable to open distalCx-OM -thromboembolic occlusion  . DIAGNOSTIC MAMMOGRAM    . left knee replacement    . MASTECTOMY    . NM MYOVIEW LTD  12/2014; 2018   Cardiologist (Dr. Kellie Simmering.  [place], [place] ph# [phone]) -->LEXISCAN: Nonischemic, "normal "  . pap smear    . tonsilectomy    . TRANSTHORACIC ECHOCARDIOGRAM  12/2014   Echocardiogram 12/25/14- normal LVEF and systolic function, EF 99991111, mild grade 1 diastolic dysfunction    MEDICATIONS/ALLERGIES   Current Meds  Medication Sig  . acetaminophen (TYLENOL) 500 MG tablet Take 500 mg by  mouth at bedtime as needed.  Marland Kitchen aspirin EC 81 MG tablet Take 81 mg by mouth at bedtime.   Marland Kitchen atorvastatin (LIPITOR) 80 MG tablet TAKE 1 TABLET DAILY  . enalapril (VASOTEC) 5 MG tablet TAKE 1 TABLET DAILY  . fexofenadine (ALLEGRA) 180 MG tablet Take 180 mg by mouth daily.  . fluticasone (FLONASE) 50 MCG/ACT nasal spray Place 1 spray into both nostrils as needed for allergies or rhinitis.  . meloxicam (MOBIC) 7.5 MG tablet TAKE 1 TABLET DAILY  . metoprolol (TOPROL-XL) 200 MG 24 hr tablet TAKE 1 TABLET BY MOUTH EVERY DAY  . nitroGLYCERIN (NITROSTAT) 0.4 MG SL tablet PLACE 1 TABLET UNDER TONGUE EVERY 5 MINUTES FOR 3 DOSES FOR CHEST PAIN  . omeprazole (PRILOSEC) 20 MG capsule Take 20 mg by mouth as  needed.   . Wheat Dextrin (BENEFIBER) POWD 2 spoonful mixed in water 3 times a day    Allergies  Allergen Reactions  . Codeine Nausea Only    SOCIAL HISTORY/FAMILY HISTORY   Reviewed in Epic:  Pertinent findings: No changes.  OBJCTIVE -PE, EKG, labs   Wt Readings from Last 3 Encounters:  03/28/19 181 lb 9.6 oz (82.4 kg)  02/03/19 183 lb 3.2 oz (83.1 kg)  09/29/18 189 lb 3.2 oz (85.8 kg)    Physical Exam: BP 135/60   Pulse 82   Temp (!) 96.8 F (36 C)   Ht 5\' 7"  (1.702 m)   Wt 181 lb 9.6 oz (82.4 kg)   SpO2 98%   BMI 28.44 kg/m  Physical Exam  Constitutional: She is oriented to person, place, and time. She appears well-developed and well-nourished.  Healthy-appearing elderly woman.  No acute distress.  Well-groomed.  HENT:  Head: Normocephalic and atraumatic.  Neck: No hepatojugular reflux and no JVD present. Carotid bruit is not present.  Cardiovascular: Regular rhythm.  Occasional extrasystoles are present. PMI is not displaced. Exam reveals decreased pulses (Mildly decreased pedal pulses.).  Murmur heard.  Medium-pitched harsh crescendo-decrescendo early systolic murmur is present with a grade of 1/6 at the upper right sternal border radiating to the neck. Pulmonary/Chest:  Effort normal and breath sounds normal. No respiratory distress. She has no wheezes. She has no rales.  Musculoskeletal:        General: Edema (Trivial) present. Normal range of motion.     Cervical back: Normal range of motion and neck supple.  Neurological: She is alert and oriented to person, place, and time.  Psychiatric: She has a normal mood and affect. Her behavior is normal. Judgment and thought content normal.  Vitals reviewed.    Adult ECG Report  Rate: 62;  Rhythm: normal sinus rhythm and LVH with repolarization changes.  If MI, age undetermined.;  Otherwise normal axis, intervals durations.  Narrative Interpretation: Relative normal EKG.  Stable.  Recent Labs:    Lab Results  Component Value Date   CHOL 163 12/21/2018   HDL 42 (L) 12/21/2018   LDLCALC 95 12/21/2018   TRIG 166 (H) 12/21/2018   CHOLHDL 3.9 12/21/2018   Lab Results  Component Value Date   CREATININE 0.86 12/21/2018   BUN 18 12/21/2018   NA 140 12/21/2018   K 4.3 12/21/2018   CL 104 12/21/2018   CO2 27 12/21/2018   Lab Results  Component Value Date   TSH 2.35 08/16/2018    ASSESSMENT/PLAN    Problem List Items Addressed This Visit    Coronary artery disease involving native heart without angina pectoris - Primary (Chronic)    History of extensive multivessel disease.  Status post CABG. Per her, none she is only used nitroglycerin once in the last 3 years.  None in the last 2.  She seems to be as active as she would like to be.  She is on aspirin and high-dose statin.  On high-dose Toprol and tolerating it relatively well.  Also on enalapril.  We talked about monitoring with stress test is etc.  She is of the mindset that we should only test if she is having symptoms.      Relevant Orders   EKG 12-Lead (Completed)   Essential hypertension (Chronic)    On high-dose Toprol along with enalapril.  Blood pressure looks good today.  Would not change.      Relevant Orders   EKG 12-Lead  (  Completed)   Hyperlipidemia with target low density lipoprotein (LDL) cholesterol less than 70 mg/dL (Chronic)    She is not goal on high-dose atorvastatin.  We talked about ideas of management, and she is reluctant to be overly aggressive.  Does not want take any extra medicines.  We will simply continue statin.      Relevant Orders   EKG 12-Lead (Completed)   Goals of care, counseling/discussion    She clearly is of the mindset that she would not want excessive testing done in the absence of symptoms.  Does not to be aggressive with lipid management.  Would not want stress test unless she has symptoms.  We will follow these wishes.  Continue to monitor for signs of declining health which may trigger decision to have more end-of-life discussions.  At present, she is just limited by spinal stenosis and otherwise very active and vibrant.          COVID-19 Education: The signs and symptoms of COVID-19 were discussed with the patient and how to seek care for testing (follow up with PCP or arrange E-visit).   The importance of social distancing was discussed today.  I spent a total of 21 minutes with the patient. >  50% of the time was spent in direct patient consultation.  Additional time spent with chart review  / charting (studies, outside notes, etc): 5 Total Time: 26 min   Current medicines are reviewed at length with the patient today.  (+/- concerns) none   Patient Instructions / Medication Changes & Studies & Tests Ordered   Patient Instructions  Medication Instructions:  No changes  *If you need a refill on your cardiac medications before your next appointment, please call your pharmacy*   Lab Work: Not needed   Testing/Procedures: Not needed   Follow-Up: At [facility], you and your health needs are our priority.  As part of our continuing mission to provide you with exceptional heart care, we have created designated Provider Care Teams.  These Care Teams  include your primary Cardiologist (physician) and Advanced Practice Providers (APPs -  Physician Assistants and Nurse Practitioners) who all work together to provide you with the care you need, when you need it.  We recommend signing up for the patient portal called "MyChart".  Sign up information is provided on this After Visit Summary.  MyChart is used to connect with patients for Virtual Visits (Telemedicine).  Patients are able to view lab/test results, encounter notes, upcoming appointments, etc.  Non-urgent messages can be sent to your provider as well.   To learn more about what you can do with MyChart, go to [email or web address].    Your next appointment:   12 month(s)  The format for your next appointment:   In Person  Provider:   Dr Glenetta Hew    Other Instructions     Studies Ordered:   Orders Placed This Encounter  Procedures  . EKG 12-Lead     Glenetta Hew, M.D., M.S. Interventional Cardiologist   Pager # [phone] Phone # [phone] [address] [place], [place] [address]   Thank you for choosing Heartcare at [facility]!!

## 2019-03-28 NOTE — Patient Instructions (Signed)
Medication Instructions:  No changes  *If you need a refill on your cardiac medications before your next appointment, please call your pharmacy*   Lab Work: Not needed   Testing/Procedures: Not needed   Follow-Up: At [facility], you and your health needs are our priority.  As part of our continuing mission to provide you with exceptional heart care, we have created designated Provider Care Teams.  These Care Teams include your primary Cardiologist (physician) and Advanced Practice Providers (APPs -  Physician Assistants and Nurse Practitioners) who all work together to provide you with the care you need, when you need it.  We recommend signing up for the patient portal called "MyChart".  Sign up information is provided on this After Visit Summary.  MyChart is used to connect with patients for Virtual Visits (Telemedicine).  Patients are able to view lab/test results, encounter notes, upcoming appointments, etc.  Non-urgent messages can be sent to your provider as well.   To learn more about what you can do with MyChart, go to [email or web address].    Your next appointment:   12 month(s)  The format for your next appointment:   In Person  Provider:   Dr Glenetta Hew    Other Instructions

## 2019-03-31 ENCOUNTER — Encounter: Payer: Self-pay | Admitting: Cardiology

## 2019-03-31 NOTE — Assessment & Plan Note (Signed)
She is not goal on high-dose atorvastatin.  We talked about ideas of management, and she is reluctant to be overly aggressive.  Does not want take any extra medicines.  We will simply continue statin.

## 2019-03-31 NOTE — Assessment & Plan Note (Signed)
She clearly is of the mindset that she would not want excessive testing done in the absence of symptoms.  Does not to be aggressive with lipid management.  Would not want stress test unless she has symptoms.  We will follow these wishes.  Continue to monitor for signs of declining health which may trigger decision to have more end-of-life discussions.  At present, she is just limited by spinal stenosis and otherwise very active and vibrant.

## 2019-03-31 NOTE — Assessment & Plan Note (Signed)
On high-dose Toprol along with enalapril.  Blood pressure looks good today.  Would not change.

## 2019-03-31 NOTE — Assessment & Plan Note (Signed)
History of extensive multivessel disease.  Status post CABG. Per her, none she is only used nitroglycerin once in the last 3 years.  None in the last 2.  She seems to be as active as she would like to be.  She is on aspirin and high-dose statin.  On high-dose Toprol and tolerating it relatively well.  Also on enalapril.  We talked about monitoring with stress test is etc.  She is of the mindset that we should only test if she is having symptoms.

## 2019-04-13 DIAGNOSIS — I4892 Unspecified atrial flutter: Secondary | ICD-10-CM

## 2019-04-13 DIAGNOSIS — I4891 Unspecified atrial fibrillation: Secondary | ICD-10-CM

## 2019-04-13 HISTORY — DX: Unspecified atrial fibrillation: I48.91

## 2019-04-13 HISTORY — DX: Unspecified atrial flutter: I48.92

## 2019-04-23 ENCOUNTER — Emergency Department (HOSPITAL_COMMUNITY): Payer: Medicare Other

## 2019-04-23 ENCOUNTER — Encounter (HOSPITAL_COMMUNITY): Payer: Self-pay

## 2019-04-23 ENCOUNTER — Other Ambulatory Visit: Payer: Self-pay

## 2019-04-23 ENCOUNTER — Inpatient Hospital Stay (HOSPITAL_COMMUNITY)
Admission: EM | Admit: 2019-04-23 | Discharge: 2019-04-25 | DRG: 287 | Disposition: A | Discharge status: Home / Self Care | Payer: Medicare Other | Attending: Internal Medicine | Admitting: Internal Medicine

## 2019-04-23 DIAGNOSIS — R7989 Other specified abnormal findings of blood chemistry: Secondary | ICD-10-CM

## 2019-04-23 DIAGNOSIS — I252 Old myocardial infarction: Secondary | ICD-10-CM | POA: Diagnosis not present

## 2019-04-23 DIAGNOSIS — Z90710 Acquired absence of both cervix and uterus: Secondary | ICD-10-CM

## 2019-04-23 DIAGNOSIS — K219 Gastro-esophageal reflux disease without esophagitis: Secondary | ICD-10-CM | POA: Diagnosis present

## 2019-04-23 DIAGNOSIS — Z853 Personal history of malignant neoplasm of breast: Secondary | ICD-10-CM | POA: Diagnosis not present

## 2019-04-23 DIAGNOSIS — Z823 Family history of stroke: Secondary | ICD-10-CM

## 2019-04-23 DIAGNOSIS — I25119 Atherosclerotic heart disease of native coronary artery with unspecified angina pectoris: Secondary | ICD-10-CM | POA: Diagnosis present

## 2019-04-23 DIAGNOSIS — Z7982 Long term (current) use of aspirin: Secondary | ICD-10-CM | POA: Diagnosis not present

## 2019-04-23 DIAGNOSIS — Z96652 Presence of left artificial knee joint: Secondary | ICD-10-CM | POA: Diagnosis present

## 2019-04-23 DIAGNOSIS — I25709 Atherosclerosis of coronary artery bypass graft(s), unspecified, with unspecified angina pectoris: Secondary | ICD-10-CM | POA: Diagnosis present

## 2019-04-23 DIAGNOSIS — I110 Hypertensive heart disease with heart failure: Secondary | ICD-10-CM | POA: Diagnosis present

## 2019-04-23 DIAGNOSIS — G4733 Obstructive sleep apnea (adult) (pediatric): Secondary | ICD-10-CM | POA: Diagnosis present

## 2019-04-23 DIAGNOSIS — Z79899 Other long term (current) drug therapy: Secondary | ICD-10-CM | POA: Diagnosis not present

## 2019-04-23 DIAGNOSIS — Z955 Presence of coronary angioplasty implant and graft: Secondary | ICD-10-CM | POA: Diagnosis not present

## 2019-04-23 DIAGNOSIS — R079 Chest pain, unspecified: Secondary | ICD-10-CM | POA: Diagnosis present

## 2019-04-23 DIAGNOSIS — R011 Cardiac murmur, unspecified: Secondary | ICD-10-CM

## 2019-04-23 DIAGNOSIS — I249 Acute ischemic heart disease, unspecified: Secondary | ICD-10-CM

## 2019-04-23 DIAGNOSIS — Z8249 Family history of ischemic heart disease and other diseases of the circulatory system: Secondary | ICD-10-CM

## 2019-04-23 DIAGNOSIS — E785 Hyperlipidemia, unspecified: Secondary | ICD-10-CM | POA: Diagnosis present

## 2019-04-23 DIAGNOSIS — Z20822 Contact with and (suspected) exposure to covid-19: Secondary | ICD-10-CM | POA: Diagnosis present

## 2019-04-23 DIAGNOSIS — R072 Precordial pain: Secondary | ICD-10-CM | POA: Diagnosis not present

## 2019-04-23 DIAGNOSIS — I214 Non-ST elevation (NSTEMI) myocardial infarction: Secondary | ICD-10-CM | POA: Diagnosis not present

## 2019-04-23 DIAGNOSIS — I2510 Atherosclerotic heart disease of native coronary artery without angina pectoris: Secondary | ICD-10-CM | POA: Diagnosis present

## 2019-04-23 DIAGNOSIS — Z85828 Personal history of other malignant neoplasm of skin: Secondary | ICD-10-CM

## 2019-04-23 DIAGNOSIS — R778 Other specified abnormalities of plasma proteins: Secondary | ICD-10-CM

## 2019-04-23 DIAGNOSIS — Z9010 Acquired absence of unspecified breast and nipple: Secondary | ICD-10-CM

## 2019-04-23 DIAGNOSIS — Z791 Long term (current) use of non-steroidal anti-inflammatories (NSAID): Secondary | ICD-10-CM

## 2019-04-23 DIAGNOSIS — I5030 Unspecified diastolic (congestive) heart failure: Secondary | ICD-10-CM | POA: Diagnosis present

## 2019-04-23 DIAGNOSIS — I10 Essential (primary) hypertension: Secondary | ICD-10-CM | POA: Diagnosis present

## 2019-04-23 DIAGNOSIS — I4892 Unspecified atrial flutter: Secondary | ICD-10-CM | POA: Diagnosis present

## 2019-04-23 DIAGNOSIS — I248 Other forms of acute ischemic heart disease: Secondary | ICD-10-CM | POA: Diagnosis present

## 2019-04-23 DIAGNOSIS — I480 Paroxysmal atrial fibrillation: Secondary | ICD-10-CM | POA: Diagnosis present

## 2019-04-23 DIAGNOSIS — I25719 Atherosclerosis of autologous vein coronary artery bypass graft(s) with unspecified angina pectoris: Principal | ICD-10-CM | POA: Diagnosis present

## 2019-04-23 DIAGNOSIS — E7800 Pure hypercholesterolemia, unspecified: Secondary | ICD-10-CM | POA: Diagnosis present

## 2019-04-23 LAB — COMPREHENSIVE METABOLIC PANEL
ALT: 18 U/L (ref 0–44)
AST: 22 U/L (ref 15–41)
Albumin: 3.6 g/dL (ref 3.5–5.0)
Alkaline Phosphatase: 46 U/L (ref 38–126)
Anion gap: 10 (ref 5–15)
BUN: 14 mg/dL (ref 8–23)
CO2: 24 mmol/L (ref 22–32)
Calcium: 9.1 mg/dL (ref 8.9–10.3)
Chloride: 102 mmol/L (ref 98–111)
Creatinine, Ser: 0.87 mg/dL (ref 0.44–1.00)
GFR calc Af Amer: >60 mL/min (ref >60)
GFR calc non Af Amer: >60 mL/min (ref >60)
Glucose, Bld: 138 mg/dL — ABNORMAL HIGH (ref 70–99)
Potassium: 3.7 mmol/L (ref 3.5–5.1)
Sodium: 136 mmol/L (ref 135–145)
Total Bilirubin: 1 mg/dL (ref 0.3–1.2)
Total Protein: 6.7 g/dL (ref 6.5–8.1)

## 2019-04-23 LAB — CBC
HCT: 41.1 % (ref 36.0–46.0)
Hemoglobin: 13.1 g/dL (ref 12.0–15.0)
MCH: 32.1 pg (ref 26.0–34.0)
MCHC: 31.9 g/dL (ref 30.0–36.0)
MCV: 100.7 fL — ABNORMAL HIGH (ref 80.0–100.0)
Platelets: 213 10*3/uL (ref 150–400)
RBC: 4.08 MIL/uL (ref 3.87–5.11)
RDW: 13.2 % (ref 11.5–15.5)
WBC: 8.2 10*3/uL (ref 4.0–10.5)
nRBC: 0 % (ref 0.0–0.2)

## 2019-04-23 LAB — TROPONIN I (HIGH SENSITIVITY)
Troponin I (High Sensitivity): 25 ng/L — ABNORMAL HIGH (ref <18)
Troponin I (High Sensitivity): 82 ng/L — ABNORMAL HIGH (ref <18)

## 2019-04-23 MED ORDER — ATORVASTATIN CALCIUM 80 MG PO TABS
80.0000 mg | ORAL_TABLET | Freq: Every day | ORAL | Status: DC
  Administered 2019-04-24: 80 mg via ORAL
  Filled 2019-04-23: qty 1

## 2019-04-23 MED ORDER — ISOSORBIDE DINITRATE 10 MG PO TABS
10.0000 mg | ORAL_TABLET | Freq: Three times a day (TID) | ORAL | Status: DC
  Administered 2019-04-24: 10 mg via ORAL
  Filled 2019-04-23 (×2): qty 1

## 2019-04-23 MED ORDER — ENALAPRIL MALEATE 5 MG PO TABS
5.0000 mg | ORAL_TABLET | Freq: Every day | ORAL | Status: DC
  Administered 2019-04-24 – 2019-04-25 (×2): 5 mg via ORAL
  Filled 2019-04-23 (×2): qty 1

## 2019-04-23 MED ORDER — ACETAMINOPHEN 325 MG PO TABS: 650.0000 mg | ORAL_TABLET | ORAL | Status: DC | PRN

## 2019-04-23 MED ORDER — METOPROLOL SUCCINATE ER 100 MG PO TB24
200.0000 mg | ORAL_TABLET | Freq: Every day | ORAL | Status: DC
  Administered 2019-04-24 – 2019-04-25 (×2): 200 mg via ORAL
  Filled 2019-04-23 (×2): qty 2

## 2019-04-23 MED ORDER — HEPARIN BOLUS VIA INFUSION
4000.0000 [IU] | Freq: Once | INTRAVENOUS | Status: AC
  Administered 2019-04-23: 4000 [IU] via INTRAVENOUS
  Filled 2019-04-23: qty 4000

## 2019-04-23 MED ORDER — ASPIRIN 81 MG PO CHEW: 324.0000 mg | CHEWABLE_TABLET | ORAL | Status: AC

## 2019-04-23 MED ORDER — LORATADINE 10 MG PO TABS
10.0000 mg | ORAL_TABLET | Freq: Every day | ORAL | Status: DC
  Administered 2019-04-24 – 2019-04-25 (×2): 10 mg via ORAL
  Filled 2019-04-23 (×2): qty 1

## 2019-04-23 MED ORDER — HEPARIN (PORCINE) 25000 UT/250ML-% IV SOLN
950.0000 [IU]/h | INTRAVENOUS | Status: DC
  Administered 2019-04-23 – 2019-04-24 (×2): 950 [IU]/h via INTRAVENOUS
  Filled 2019-04-23 (×2): qty 250

## 2019-04-23 MED ORDER — ASPIRIN EC 81 MG PO TBEC
81.0000 mg | DELAYED_RELEASE_TABLET | Freq: Every day | ORAL | Status: DC
  Administered 2019-04-24 – 2019-04-25 (×2): 81 mg via ORAL
  Filled 2019-04-23 (×2): qty 1

## 2019-04-23 MED ORDER — NITROGLYCERIN 0.4 MG SL SUBL: 0.4000 mg | SUBLINGUAL_TABLET | SUBLINGUAL | Status: DC | PRN

## 2019-04-23 MED ORDER — ASPIRIN 300 MG RE SUPP
300.0000 mg | RECTAL | Status: DC
  Filled 2019-04-23: qty 1

## 2019-04-23 MED ORDER — FLUTICASONE PROPIONATE 50 MCG/ACT NA SUSP: 1.0000 | Freq: Every day | NASAL | Status: DC | PRN

## 2019-04-23 MED ORDER — ONDANSETRON HCL 4 MG/2ML IJ SOLN: 4.0000 mg | Freq: Four times a day (QID) | INTRAMUSCULAR | Status: DC | PRN

## 2019-04-23 MED ORDER — PANTOPRAZOLE SODIUM 40 MG PO TBEC
40.0000 mg | DELAYED_RELEASE_TABLET | Freq: Every day | ORAL | Status: DC
  Administered 2019-04-24 – 2019-04-25 (×2): 40 mg via ORAL
  Filled 2019-04-23 (×2): qty 1

## 2019-04-23 NOTE — H&P (Signed)
Cardiology Admission History and Physical:   Patient ID: Stephanie Shea MRN: [id number]; DOB: [birth year]   Admission date: 04/23/2019  Primary Care Provider: Mast, [name], NP Primary Cardiologist: Glenetta Hew, MD  Primary Electrophysiologist:  None   Chief Complaint:  Chest pain  Patient Profile:   Stephanie Shea is a 86 y.o. female with history of CAD s/p CABG (2002) and prior PCI (most recent 2006), HTN, OSA presents with chest pain.   History of Present Illness:   Stephanie Shea reports an episode of chest and neck pain/pressure earlier this evening. She was eating dinner at her assisted living facility when she suddenly felt chest and neck pain/pressure. She states she has had similar episodes before but these have quickly resolved with 1 SLN. Tonight her episode was more severe and lasted longer than before. She took 1 SLN and her pain persisted. She contacted her nurse who then called EMS. By the time EMS arrived she was chest pain free. She was brought to the ED for further workup.  On arrival to the ED the patient's vital signs were within normal limits. An ECG revealed LVH with repolarization abnormalities, similar to prior ECGs. Her initial troponin was found to be elevated at 25. She was given full dose aspirin and started on a heparin drip. She remained chest pain free throughout the duration of her ED stay.   Notably, the patient states she is in good health and functionally independent. She is able to do everything that she wants to do without limiting angina or SOB. She takes all of her medications as prescribed without any missed doses. She does not smoke or drink alcohol.   Heart Pathway Score:     Past Medical History:  Diagnosis Date  . Atherosclerosis of native coronary artery 2001   LAD & Cx disease -- BMS PCI in 2001; CABG 2002.  PCI in 2006 (per-op TAH)  . Breast cancer ([place])   . Cervix dysplasia   . GERD (gastroesophageal reflux disease)   . Heart attack ([place])  2002, 2006   Both events reportedly were perioperatively. She has no recollection. Initial PCI was related to angina symptoms (bilateral arm pain)  . High cholesterol   . Hypertension   . Lumbosacral spinal stenosis   . Osteoarthritis   . Skin cancer   . Sleep apnea    Uses CPAP faithfully    Past Surgical History:  Procedure Laterality Date  . ABDOMINAL HYSTERECTOMY    . bone density    . CARDIAC CATHETERIZATION  2001    Dr. Glade [facility]) 2 vessel CAD involving LAD-D1 and distal Cx-OM. Appearance of dilated tapered left main with no significant atherosclerosis on IVUS --  BMS PCI of pLAD ([facility] BMS 3.0 mm x 15 mm) & with PTCA of ostial D1, PTCA of dLAD.  BMS PCI pCx [facility] BMS 3.5 mm x 11 mm) - unable to reopen dCx-OM (probable distal Atheroembolic occlusion.  Normal LVEF.  Marland Kitchen COLONOSCOPY    . CORONARY ARTERY BYPASS GRAFT  2002   Unsure of details [facility], in [facility])  . CORONARY STENT INTERVENTION  2001   ([place]) [facility] BMS PCI pLAD (3.0 mm x 15 mm) -PTCA of jailed D1 as well as distal LAD; pCx ([facility] BMS 3.5 mm x 11 mm) -unable to open distalCx-OM -thromboembolic occlusion  . DIAGNOSTIC MAMMOGRAM    . left knee replacement    . MASTECTOMY    . NM MYOVIEW LTD  12/2014; 2018  Cardiologist (Dr. Kellie Simmering.  [place], [place] ph# [phone]) -->LEXISCAN: Nonischemic, "normal "  . pap smear    . tonsilectomy    . TRANSTHORACIC ECHOCARDIOGRAM  12/2014   Echocardiogram 12/25/14- normal LVEF and systolic function, EF 99991111, mild grade 1 diastolic dysfunction     Medications Prior to Admission: Prior to Admission medications   Medication Sig Start Date End Date Taking? Authorizing Provider  acetaminophen (TYLENOL) 500 MG tablet Take 500 mg by mouth at bedtime as needed.    [provider]  aspirin EC 81 MG tablet Take 81 mg by mouth at bedtime.     [provider]  atorvastatin (LIPITOR) 80 MG tablet TAKE 1 TABLET DAILY 02/07/19   Mast, [name], NP    enalapril (VASOTEC) 5 MG tablet TAKE 1 TABLET DAILY 12/21/18   Mast, [name], NP  fexofenadine (ALLEGRA) 180 MG tablet Take 180 mg by mouth daily.    [provider]  fluticasone (FLONASE) 50 MCG/ACT nasal spray Place 1 spray into both nostrils as needed for allergies or rhinitis.    [provider]  meloxicam (MOBIC) 7.5 MG tablet TAKE 1 TABLET DAILY 12/21/18   Mast, [name], NP  metoprolol (TOPROL-XL) 200 MG 24 hr tablet TAKE 1 TABLET BY MOUTH EVERY DAY 11/16/18   Virgie Dad, MD  nitroGLYCERIN (NITROSTAT) 0.4 MG SL tablet PLACE 1 TABLET UNDER TONGUE EVERY 5 MINUTES FOR 3 DOSES FOR CHEST PAIN 12/26/18   Leonie Man, MD  omeprazole (PRILOSEC) 20 MG capsule Take 20 mg by mouth as needed.     [provider]  Wheat Dextrin (BENEFIBER) POWD 2 spoonful mixed in water 3 times a day 09/08/16   Blanchie Serve, MD     Allergies:    Allergies  Allergen Reactions  . Codeine Nausea Only    Social History:   Social History   Socioeconomic History  . Marital status: Widowed    Spouse name: Not on file  . Number of children: Not on file  . Years of education: Not on file  . Highest education level: Not on file  Occupational History  . Not on file  Tobacco Use  . Smoking status: Never Smoker  . Smokeless tobacco: Never Used  Substance and Sexual Activity  . Alcohol use: No  . Drug use: No  . Sexual activity: Not Currently  Other Topics Concern  . Not on file  Social History Narrative   Recently moved back to [place] (lives alone in a [place] apartment at [facility])   Do you drink/eat things with caffeine? 2 cups of coffee every day   What year were you married? [place] - twice widowed   No pets.      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 Has POA      Primary Emergency Contact: [id number]   Address: [address] [place],   [place] [address]   Home Phone: [phone]   Social Determinants of Health   Financial Resource Strain:   . Difficulty of Paying Living Expenses:   Food Insecurity:   . Worried About Charity fundraiser in the Last Year:   . Arboriculturist in the Last Year:   Transportation Needs:   . Film/video editor (Medical):   Marland Kitchen Lack of Transportation (Non-Medical):  Physical Activity:   . Days of Exercise per Week:   . Minutes of Exercise per Session:   Stress:   . Feeling of Stress :   Social Connections:   . Frequency of Communication with Friends and Family:   . Frequency of Social Gatherings with Friends and Family:   . Attends Religious Services:   . Active Member of Clubs or Organizations:   . Attends Archivist Meetings:   Marland Kitchen Marital Status:   Intimate Partner Violence:   . Fear of Current or Ex-Partner:   . Emotionally Abused:   Marland Kitchen Physically Abused:   . Sexually Abused:     Family History:   The patient's family history includes Heart disease in her father; Stroke in her mother.    ROS:  Please see the history of present illness.  All other ROS reviewed and negative.     Physical Exam/Data:   Vitals:   04/23/19 1951 04/23/19 1952 04/23/19 2015  BP: 140/72  130/75  Pulse: (!) 144  84  Resp: 16  (!) 24  Temp: 97.8 F (36.6 C)    TempSrc: Oral    SpO2: 97%  92%  Weight:  82.1 kg   Height:  5\' 7"  (1.702 m)    No intake or output data in the 24 hours ending 04/23/19 2150 Last 3 Weights 04/23/2019 03/28/2019 02/03/2019  Weight (lbs) 181 lb 181 lb 9.6 oz 183 lb 3.2 oz  Weight (kg) 82.101 kg 82.373 kg 83.099 kg     Body mass index is 28.35 kg/m.  General:  Well nourished, well developed, in no acute distress, pleasant HEENT: normal Lymph: no adenopathy Neck: JVP minimally elevated to ~7cm H2O Endocrine:  No thryomegaly Vascular: No carotid bruits; FA pulses 2+ bilaterally without bruits  Cardiac:  normal S1, S2; 3/6 holosystolic murmur heart most prominently  at the upper sternal border Lungs:  clear to auscultation bilaterally, no wheezing, rhonchi or rales  Abd: soft, nontender, no hepatomegaly  Ext: 2+ ankle edema bilaterally with skin changes consistent with chronic venous insufficiency Musculoskeletal:  No deformities, BUE and BLE strength normal and equal Skin: warm and dry  Neuro:  CNs 2-12 intact, no focal abnormalities noted Psych:  Normal affect    EKG:  The ECG that was done on arrival to the ED was personally reviewed and demonstrates NSR, borderline LVH, nonspecific ST/T wave changes similar to prior ECGs  Relevant CV Studies: Echo from 2016: normal LVEF, no significant valvular disease  Laboratory Data:  High Sensitivity Troponin:   Recent Labs  Lab 04/23/19 2003  TROPONINIHS 25*      Chemistry Recent Labs  Lab 04/23/19 2003  NA 136  K 3.7  CL 102  CO2 24  GLUCOSE 138*  BUN 14  CREATININE 0.87  CALCIUM 9.1  GFRNONAA >60  GFRAA >60  ANIONGAP 10    Recent Labs  Lab 04/23/19 2003  PROT 6.7  ALBUMIN 3.6  AST 22  ALT 18  ALKPHOS 46  BILITOT 1.0   Hematology Recent Labs  Lab 04/23/19 2003  WBC 8.2  RBC 4.08  HGB 13.1  HCT 41.1  MCV 100.7*  MCH 32.1  MCHC 31.9  RDW 13.2  PLT 213   BNPNo results for input(s): BNP, PROBNP in the last 168 hours.  DDimer No results for input(s): DDIMER in the last 168 hours.   Radiology/Studies:  DG Chest Port 1 View  Result Date: 04/23/2019 CLINICAL DATA:  Neck and chest tightness. EXAM:  PORTABLE CHEST 1 VIEW COMPARISON:  None. FINDINGS: Multiple sternal wires vascular clips are seen. Mild, chronic appearing increased lung markings are seen without evidence of acute infiltrate, pleural effusion or pneumothorax. The heart size and mediastinal contours are within normal limits. There is moderate severity calcification aortic arch. Degenerative changes are seen throughout the thoracic spine. IMPRESSION: 1. Evidence of prior median sternotomy/CABG. 2. No acute or  active cardiopulmonary disease. Electronically Signed   By: Virgina Norfolk M.D.   On: 04/23/2019 20:20       TIMI Risk Score for Unstable Angina or Non-ST Elevation MI:   The patient's TIMI risk score is 5, which indicates a 26% risk of all cause mortality, new or recurrent myocardial infarction or need for urgent revascularization in the next 14 days.   Assessment and Plan:  Stephanie Shea is a 86 y.o. female with history of CAD s/p CABG (2002) and prior PCI (most recent 2006), HTN, OSA presents with chest pain and elevated troponin.   #) Chest pain and elevated troponin: patient expressing preference to avoid invasive testing if possible, but willing to undergoing invasive cath if necessary. Patient's symptoms similar to her multiple prior (infrequent) episodes of angina, however this episode was provoked by eating and was more severe than prior episodes (not immediately resolving with SLN like others have). Will repeat troponin to eval for trend. If troponin stable/trivally elevated, may be reasonable to proceed with medical management. If however chest pain recurs or if troponin rapidly uptrending, invasive evaluation may be warranted. Currently patient asymptomatic.  Diagnostics: - repeat troponin x1 - echo in AM - check lipids, A1c  Therapeutics: - NPO for possible cath in AM - ASA 324mg  then 81mg  daily - heparin drip for ACS per pharmacy protocol - cont home atorvastatin 80mg  QHS - cont home enalapril 5mg  daily - cont home metoprolol 200mg  daily - start isordil 10mg  TID - SLN, nitro gtt PRN - defer P2Y12 pending decision on cath  #) Systolic murmur: no known history of valvular disease, last echo in 2016. Patient does not report significant functional limitation that would suggest hemodynamically significant valvular disease, though reasonable to repeat echo in setting of chest pain and positive troponin.  - echo in AM as per above  Severity of Illness: The appropriate patient  status for this patient is INPATIENT. Inpatient status is judged to be reasonable and necessary in order to provide the required intensity of service to ensure the patient's safety. The patient's presenting symptoms, physical exam findings, and initial radiographic and laboratory data in the context of their chronic comorbidities is felt to place them at high risk for further clinical deterioration. Furthermore, it is not anticipated that the patient will be medically stable for discharge from the hospital within 2 midnights of admission. The following factors support the patient status of inpatient.   " The patient's presenting symptoms include chest pain. " The worrisome physical exam findings include systolic murmur. " The initial radiographic and laboratory data are worrisome because of elevated troponin. " The chronic co-morbidities include known CAD.   * I certify that at the point of admission it is my clinical judgment that the patient will require inpatient hospital care spanning beyond 2 midnights from the point of admission due to high intensity of service, high risk for further deterioration and high frequency of surveillance required.*    For questions or updates, please contact [place] Please consult [email or web address] for contact info under  Signed, Marcie Mowers, MD  04/23/2019 9:50 PM \

## 2019-04-23 NOTE — ED Triage Notes (Signed)
Pt BIB [facility] from a friend's home after feeling tightness in neck and chest. Pt hx of multiple MI's, took 1 nitro, no relief, 1 nitro 10 min later w/ relief. EMS placed pt on 2 L [place] for 91-92% on RA, 96% RA on arrival. Pt A&O x4, NAD noted.

## 2019-04-23 NOTE — ED Provider Notes (Signed)
[facility] Provider Note   CSN: [id number] Arrival date & time: 04/23/19  1944     History Chief Complaint  Patient presents with  . Chest Pain    Stephanie Shea is a 86 y.o. female.  Patient with hx cad, s/p cabg ~ 20 yrs ago, c/o upper chest and base of neck area tightness or pressure sensation when went to dinner tonight. Symptoms acute onset, moderate, pressure/heavy, non radiating. Pt walked back to her room, and symptoms felt worse. Took ntg. Several minutes after ntg symptoms improved. Patient denies recent similar episodes although states had same symptoms years ago when cardiac problems. No associated sob, nv or diaphoresis. Pain was not pleuritic. No heartburn. No leg pain or swelling. No cough or uri symptoms. No fever or chills.   The history is provided by the patient and the EMS personnel.  Chest Pain Associated symptoms: no abdominal pain, no back pain, no cough, no fever, no headache, no palpitations, no shortness of breath and no vomiting        Past Medical History:  Diagnosis Date  . Atherosclerosis of native coronary artery 2001   LAD & Cx disease -- BMS PCI in 2001; CABG 2002.  PCI in 2006 (per-op TAH)  . Breast cancer ([place])   . Cervix dysplasia   . GERD (gastroesophageal reflux disease)   . Heart attack ([place]) 2002, 2006   Both events reportedly were perioperatively. She has no recollection. Initial PCI was related to angina symptoms (bilateral arm pain)  . High cholesterol   . Hypertension   . Lumbosacral spinal stenosis   . Osteoarthritis   . Skin cancer   . Sleep apnea    Uses CPAP faithfully    Patient Active Problem List   Diagnosis Date Noted  . Post-menopause 09/30/2018  . Osteopenia 08/19/2018  . Prediabetes 05/20/2018  . Mild memory disturbance 12/30/2017  . Overweight (BMI 25.0-29.9) 06/22/2017  . Goals of care, counseling/discussion 06/22/2017  . Insufficiency, arterial, peripheral ([place])  05/20/2017  . Gait abnormality 05/20/2017  . Osteoarthritis of multiple joints 12/01/2016  . Hyperlipidemia with target low density lipoprotein (LDL) cholesterol less than 70 mg/dL 12/01/2016  . Abdominal wall hernia 12/01/2016  . Hyperglycemia 12/01/2016  . Essential hypertension 09/08/2016  . Coronary artery disease involving native heart without angina pectoris 09/08/2016  . History of breast cancer in female 09/08/2016  . Spinal stenosis of lumbar region without neurogenic claudication 09/08/2016  . Allergic rhinitis 09/08/2016  . Gastroesophageal reflux disease 09/08/2016  . Constipation 09/08/2016  . OSA on CPAP 09/08/2016    Past Surgical History:  Procedure Laterality Date  . ABDOMINAL HYSTERECTOMY    . bone density    . CARDIAC CATHETERIZATION  2001    Dr. Glade Lloyd [facility]) 2 vessel CAD involving LAD-D1 and distal Cx-OM. Appearance of dilated tapered left main with no significant atherosclerosis on IVUS --  BMS PCI of pLAD ([facility] BMS 3.0 mm x 15 mm) & with PTCA of ostial D1, PTCA of dLAD.  BMS PCI pCx [facility] BMS 3.5 mm x 11 mm) - unable to reopen dCx-OM (probable distal Atheroembolic occlusion.  Normal LVEF.  Marland Kitchen COLONOSCOPY    . CORONARY ARTERY BYPASS GRAFT  2002   Unsure of details [facility], in [place])  . CORONARY STENT INTERVENTION  2001   ([place]) [facility] BMS PCI pLAD (3.0 mm x 15 mm) -PTCA of jailed D1 as well as distal LAD; pCx ([facility] BMS 3.5 mm x  11 mm) -unable to open distalCx-OM -thromboembolic occlusion  . DIAGNOSTIC MAMMOGRAM    . left knee replacement    . MASTECTOMY    . NM MYOVIEW LTD  12/2014; 2018   Cardiologist (Dr. Kellie Simmering.  [place], [place] ph# [phone]) -->LEXISCAN: Nonischemic, "normal "  . pap smear    . tonsilectomy    . TRANSTHORACIC ECHOCARDIOGRAM  12/2014   Echocardiogram 12/25/14- normal LVEF and systolic function, EF [id number], mild grade 1 diastolic dysfunction     OB History   No obstetric history on file.     Family History    Problem Relation Age of Onset  . Stroke Mother   . Heart disease Father     Social History   Tobacco Use  . Smoking status: Never Smoker  . Smokeless tobacco: Never Used  Substance Use Topics  . Alcohol use: No  . Drug use: No    Home Medications Prior to Admission medications   Medication Sig Start Date End Date Taking? Authorizing Provider  acetaminophen (TYLENOL) 500 MG tablet Take 500 mg by mouth at bedtime as needed.    [provider]  aspirin EC 81 MG tablet Take 81 mg by mouth at bedtime.     [provider]  atorvastatin (LIPITOR) 80 MG tablet TAKE 1 TABLET DAILY 02/07/19   Mast, [name], NP  enalapril (VASOTEC) 5 MG tablet TAKE 1 TABLET DAILY 12/21/18   Mast, [name], NP  fexofenadine (ALLEGRA) 180 MG tablet Take 180 mg by mouth daily.    [provider]  fluticasone (FLONASE) 50 MCG/ACT nasal spray Place 1 spray into both nostrils as needed for allergies or rhinitis.    [provider]  meloxicam (MOBIC) 7.5 MG tablet TAKE 1 TABLET DAILY 12/21/18   Mast, [name], NP  metoprolol (TOPROL-XL) 200 MG 24 hr tablet TAKE 1 TABLET BY MOUTH EVERY DAY 11/16/18   Virgie Dad, MD  nitroGLYCERIN (NITROSTAT) 0.4 MG SL tablet PLACE 1 TABLET UNDER TONGUE EVERY 5 MINUTES FOR 3 DOSES FOR CHEST PAIN 12/26/18   Leonie Man, MD  omeprazole (PRILOSEC) 20 MG capsule Take 20 mg by mouth as needed.     [provider]  Wheat Dextrin (BENEFIBER) POWD 2 spoonful mixed in water 3 times a day 09/08/16   Blanchie Serve, MD    Allergies    Codeine  Review of Systems   Review of Systems  Constitutional: Negative for fever.  HENT: Negative for sore throat.   Eyes: Negative for redness.  Respiratory: Negative for cough and shortness of breath.   Cardiovascular: Positive for chest pain. Negative for palpitations and leg swelling.  Gastrointestinal: Negative for abdominal pain and vomiting.  Endocrine: Negative for polyuria.  Genitourinary: Negative for  flank pain.  Musculoskeletal: Negative for back pain.  Skin: Negative for rash.  Neurological: Negative for headaches.  Hematological: Does not bruise/bleed easily.  Psychiatric/Behavioral: Negative for confusion.    Physical Exam Updated Vital Signs BP 130/75   Pulse 84   Temp 97.8 F (36.6 C) (Oral)   Resp (!) 24   Ht 1.702 m (5\' 7" )   Wt 82.1 kg   SpO2 92%   BMI 28.35 kg/m   Physical Exam Vitals and nursing note reviewed.  Constitutional:      Appearance: Normal appearance. She is well-developed.  HENT:     Head: Atraumatic.     Nose: Nose normal.     Mouth/Throat:     Mouth: Mucous membranes  are moist.  Eyes:     General: No scleral icterus.    Conjunctiva/sclera: Conjunctivae normal.  Neck:     Trachea: No tracheal deviation.     Comments: Trachea midline. Thyroid not grossly enlarged or tender.  Cardiovascular:     Rate and Rhythm: Normal rate and regular rhythm.     Pulses: Normal pulses.     Heart sounds: Normal heart sounds. No murmur. No friction rub. No gallop.   Pulmonary:     Effort: Pulmonary effort is normal. No respiratory distress.     Breath sounds: Normal breath sounds.  Chest:     Chest wall: No tenderness.  Abdominal:     General: Bowel sounds are normal. There is no distension.     Palpations: Abdomen is soft.     Tenderness: There is no abdominal tenderness. There is no guarding.  Genitourinary:    Comments: No cva tenderness.  Musculoskeletal:        General: No swelling or tenderness.     Cervical back: Normal range of motion and neck supple. No rigidity or tenderness. No muscular tenderness.  Skin:    General: Skin is warm and dry.     Findings: No rash.  Neurological:     Mental Status: She is alert.     Comments: Alert, speech normal.   Psychiatric:        Mood and Affect: Mood normal.      ED Results / Procedures / Treatments   Labs (all labs ordered are listed, but only abnormal results are displayed) Results for  orders placed or performed during the hospital encounter of 04/23/19  CBC  Result Value Ref Range   WBC 8.2 4.0 - 10.5 K/uL   RBC 4.08 3.87 - 5.11 MIL/uL   Hemoglobin 13.1 12.0 - 15.0 g/dL   HCT 41.1 36.0 - 46.0 %   MCV 100.7 (H) 80.0 - 100.0 fL   MCH 32.1 26.0 - 34.0 pg   MCHC 31.9 30.0 - 36.0 g/dL   RDW 13.2 11.5 - 15.5 %   Platelets 213 150 - 400 K/uL   nRBC 0.0 0.0 - 0.2 %  Comprehensive metabolic panel  Result Value Ref Range   Sodium 136 135 - 145 mmol/L   Potassium 3.7 3.5 - 5.1 mmol/L   Chloride 102 98 - 111 mmol/L   CO2 24 22 - 32 mmol/L   Glucose, Bld 138 (H) 70 - 99 mg/dL   BUN 14 8 - 23 mg/dL   Creatinine, Ser 0.87 0.44 - 1.00 mg/dL   Calcium 9.1 8.9 - 10.3 mg/dL   Total Protein 6.7 6.5 - 8.1 g/dL   Albumin 3.6 3.5 - 5.0 g/dL   AST 22 15 - 41 U/L   ALT 18 0 - 44 U/L   Alkaline Phosphatase 46 38 - 126 U/L   Total Bilirubin 1.0 0.3 - 1.2 mg/dL   GFR calc non Af Amer >60 >60 mL/min   GFR calc Af Amer >60 >60 mL/min   Anion gap 10 5 - 15  Troponin I (High Sensitivity)  Result Value Ref Range   Troponin I (High Sensitivity) 25 (H) <18 ng/L   DG Chest Port 1 View  Result Date: 04/23/2019 CLINICAL DATA:  Neck and chest tightness. EXAM: PORTABLE CHEST 1 VIEW COMPARISON:  None. FINDINGS: Multiple sternal wires vascular clips are seen. Mild, chronic appearing increased lung markings are seen without evidence of acute infiltrate, pleural effusion or pneumothorax. The heart size and mediastinal  contours are within normal limits. There is moderate severity calcification aortic arch. Degenerative changes are seen throughout the thoracic spine. IMPRESSION: 1. Evidence of prior median sternotomy/CABG. 2. No acute or active cardiopulmonary disease. Electronically Signed   By: Virgina Norfolk M.D.   On: 04/23/2019 20:20    EKG EKG Interpretation  Date/Time:  Sunday April 23 2019 19:48:41 EDT Ventricular Rate:  79 PR Interval:    QRS Duration: 97 QT Interval:  373 QTC  Calculation: 428 R Axis:   34 Text Interpretation: Sinus rhythm Non-specific ST-t changes Confirmed by Lajean Saver [phone]) on 04/23/2019 7:55:19 PM   Radiology DG Chest Port 1 View  Result Date: 04/23/2019 CLINICAL DATA:  Neck and chest tightness. EXAM: PORTABLE CHEST 1 VIEW COMPARISON:  None. FINDINGS: Multiple sternal wires vascular clips are seen. Mild, chronic appearing increased lung markings are seen without evidence of acute infiltrate, pleural effusion or pneumothorax. The heart size and mediastinal contours are within normal limits. There is moderate severity calcification aortic arch. Degenerative changes are seen throughout the thoracic spine. IMPRESSION: 1. Evidence of prior median sternotomy/CABG. 2. No acute or active cardiopulmonary disease. Electronically Signed   By: Virgina Norfolk M.D.   On: 04/23/2019 20:20      Procedures Procedures (including critical care time)  Medications Ordered in ED Medications - No data to display  ED Course  I have reviewed the triage vital signs and the nursing notes.  Pertinent labs & imaging results that were available during my care of the patient were reviewed by me and considered in my medical decision making (see chart for details).    MDM Rules/Calculators/A&P                      Iv ns. Continuous pulse ox and monitor. Stat labs. Ecg. Pcxr.   Reviewed nursing notes and prior charts for additional history.  Hx cad/cabg - no recent ED eval for CP.   Patient symptoms, chest/neck pain with broad diff dx, including ACS/Stemi, angina, pna, ptx, gerd, msk pain - many of these conditions carry significant risk, and morbidity/mortality.   MDM Number of Diagnoses or Management Options   Amount and/or Complexity of Data Reviewed Clinical lab tests: ordered and reviewed Tests in the radiology section of CPT: ordered Tests in the medicine section of CPT: ordered and reviewed Discussion of test results with the performing  providers: yes Decide to obtain previous medical records or to obtain history from someone other than the patient: yes Obtain history from someone other than the patient: yes Review and summarize past medical records: yes Discuss the patient with other providers: yes Independent visualization of images, tracings, or specimens: yes  Risk of Complications, Morbidity, and/or Mortality Presenting problems: high Diagnostic procedures: high Management options: high  EMS ecgs reviewed - when patient having symptoms/pain, appears to have mild st elevation III, and AVF - concerning for inferior ischemia/ACS. In ED, pt indicates symptoms have completely resolved. Await trop. Heparin per pharmacy re ACS.  Initial labs reviewed/interpreted by me - wbc and hgb normal.  CXR reviewed/interpreted by me - no pna.   Cardiology consulted - discussed pt, ecgs, ems ecgs, labs, cxr - will see in ED.  Recheck pt, no cp or discomfort.   Additional labs reviewed and interpreted by me - initial trop is mildly elev.   CRITICAL CARE RE: ACS with elevated trop, ecg changes,  Performed by: Mirna Mires Total critical care time: 35 minutes Critical care time was  exclusive of separately billable procedures and treating other patients. Critical care was necessary to treat or prevent imminent or life-threatening deterioration. Critical care was time spent personally by me on the following activities: development of treatment plan with patient and/or surrogate as well as nursing, discussions with consultants, evaluation of patient's response to treatment, examination of patient, obtaining history from patient or surrogate, ordering and performing treatments and interventions, ordering and review of laboratory studies, ordering and review of radiographic studies, pulse oximetry and re-evaluation of patient's condition.       Final Clinical Impression(s) / ED Diagnoses Final diagnoses:  None    Rx / DC  Orders ED Discharge Orders    None       Lajean Saver, MD 04/23/19 2053

## 2019-04-23 NOTE — Progress Notes (Signed)
[facility] for Heparin Indication: chest pain/ACS  Allergies  Allergen Reactions  . Codeine Nausea Only    Patient Measurements: Height: 5\' 7"  (170.2 cm) Weight: 82.1 kg (181 lb) IBW/kg (Calculated) : 61.6 Heparin Dosing Weight: 78.5 kg  Vital Signs: Temp: 97.8 F (36.6 C) (04/11 1951) Temp Source: Oral (04/11 1951) BP: 130/75 (04/11 2015) Pulse Rate: 84 (04/11 2015)  Labs: Recent Labs    04/23/19 2003  HGB 13.1  HCT 41.1  PLT 213  CREATININE 0.87  TROPONINIHS 25*    Estimated Creatinine Clearance: 54 mL/min (by C-G formula based on SCr of 0.87 mg/dL).   Medical History: Past Medical History:  Diagnosis Date  . Atherosclerosis of native coronary artery 2001   LAD & Cx disease -- BMS PCI in 2001; CABG 2002.  PCI in 2006 (per-op TAH)  . Breast cancer ([place])   . Cervix dysplasia   . GERD (gastroesophageal reflux disease)   . Heart attack ([place]) 2002, 2006   Both events reportedly were perioperatively. She has no recollection. Initial PCI was related to angina symptoms (bilateral arm pain)  . High cholesterol   . Hypertension   . Lumbosacral spinal stenosis   . Osteoarthritis   . Skin cancer   . Sleep apnea    Uses CPAP faithfully    Medications:  Scheduled:  . heparin  4,000 Units Intravenous Once    Assessment: Patient is a 37 yof that presented to the ED with CP. The patient has had a previous CABG. Patient did have a slight elevation in her trop. Pharmacy has been asked to dose heparin at this time for CP/ACS.  Goal of Therapy:  Heparin level 0.3-0.7 units/ml Monitor platelets by anticoagulation protocol: Yes   Plan:  - Heparin bolus 4000 untis IV x 1 dose - Heparin drip @ 950 units/hr - Heparin level in ~ 7 hours  - Monitor patient for s/s of bleeding and CBC while on heparin   Duanne Limerick PharmD. BCPS  04/23/2019,8:55 PM

## 2019-04-24 ENCOUNTER — Inpatient Hospital Stay (HOSPITAL_COMMUNITY): Payer: Medicare Other

## 2019-04-24 DIAGNOSIS — I214 Non-ST elevation (NSTEMI) myocardial infarction: Secondary | ICD-10-CM

## 2019-04-24 DIAGNOSIS — R079 Chest pain, unspecified: Secondary | ICD-10-CM

## 2019-04-24 HISTORY — PX: TRANSTHORACIC ECHOCARDIOGRAM: SHX275

## 2019-04-24 LAB — HEPARIN LEVEL (UNFRACTIONATED)
Heparin Unfractionated: 0.64 IU/mL (ref 0.30–0.70)
Heparin Unfractionated: 0.54 IU/mL (ref 0.30–0.70)

## 2019-04-24 LAB — CBC
HCT: 37.8 % (ref 36.0–46.0)
Hemoglobin: 12.3 g/dL (ref 12.0–15.0)
MCH: 31.8 pg (ref 26.0–34.0)
MCHC: 32.5 g/dL (ref 30.0–36.0)
MCV: 97.7 fL (ref 80.0–100.0)
Platelets: 198 10*3/uL (ref 150–400)
RBC: 3.87 MIL/uL (ref 3.87–5.11)
RDW: 13.2 % (ref 11.5–15.5)
WBC: 7.1 10*3/uL (ref 4.0–10.5)
nRBC: 0 % (ref 0.0–0.2)

## 2019-04-24 LAB — BASIC METABOLIC PANEL
Anion gap: 12 (ref 5–15)
BUN: 14 mg/dL (ref 8–23)
CO2: 25 mmol/L (ref 22–32)
Calcium: 9.4 mg/dL (ref 8.9–10.3)
Chloride: 105 mmol/L (ref 98–111)
Creatinine, Ser: 0.87 mg/dL (ref 0.44–1.00)
GFR calc Af Amer: >60 mL/min (ref >60)
GFR calc non Af Amer: >60 mL/min (ref >60)
Glucose, Bld: 114 mg/dL — ABNORMAL HIGH (ref 70–99)
Potassium: 4 mmol/L (ref 3.5–5.1)
Sodium: 142 mmol/L (ref 135–145)

## 2019-04-24 LAB — MRSA PCR SCREENING: MRSA by PCR: NEGATIVE

## 2019-04-24 LAB — ECHOCARDIOGRAM COMPLETE
Height: 67 in
Weight: 2896 oz

## 2019-04-24 LAB — SARS CORONAVIRUS 2 (TAT 6-24 HRS): SARS Coronavirus 2: NEGATIVE

## 2019-04-24 MED ORDER — AMIODARONE HCL 200 MG PO TABS
400.0000 mg | ORAL_TABLET | Freq: Two times a day (BID) | ORAL | Status: DC
  Administered 2019-04-24 – 2019-04-25 (×3): 400 mg via ORAL
  Filled 2019-04-24 (×3): qty 2

## 2019-04-24 NOTE — Progress Notes (Addendum)
Dr. Stanford Breed at bedside HR currently 90s. Intermittent asymptomatic aflutter 2:1 HR 140-150.

## 2019-04-24 NOTE — Progress Notes (Signed)
Heart rate on 130's -140's afib- asymptomatic , seen by cardiologist with order, started on amiodarone. Continue to monitor.

## 2019-04-24 NOTE — Plan of Care (Signed)

## 2019-04-24 NOTE — Progress Notes (Signed)
[facility] for Heparin Indication: chest pain/ACS  Allergies  Allergen Reactions  . Codeine Nausea Only    Patient Measurements: Height: 5\' 7"  (170.2 cm) Weight: 82.1 kg (181 lb) IBW/kg (Calculated) : 61.6 Heparin Dosing Weight: 78.5 kg  Vital Signs: Temp: 97.7 F (36.5 C) (04/12 1052) Temp Source: Oral (04/12 1052) BP: 124/74 (04/12 1052) Pulse Rate: 139 (04/12 0700)  Labs: Recent Labs    04/23/19 2003 04/23/19 2238 04/24/19 0508 04/24/19 1205  HGB 13.1  --  12.3  --   HCT 41.1  --  37.8  --   PLT 213  --  198  --   HEPARINUNFRC  --   --  0.64 0.54  CREATININE 0.87  --  0.87  --   TROPONINIHS 25* 82*  --   --     Estimated Creatinine Clearance: 54 mL/min (by C-G formula based on SCr of 0.87 mg/dL).   Medical History: Past Medical History:  Diagnosis Date  . Atherosclerosis of native coronary artery 2001   LAD & Cx disease -- BMS PCI in 2001; CABG 2002.  PCI in 2006 (per-op TAH)  . Breast cancer ([place])   . Cervix dysplasia   . GERD (gastroesophageal reflux disease)   . Heart attack ([place]) 2002, 2006   Both events reportedly were perioperatively. She has no recollection. Initial PCI was related to angina symptoms (bilateral arm pain)  . High cholesterol   . Hypertension   . Lumbosacral spinal stenosis   . Osteoarthritis   . Skin cancer   . Sleep apnea    Uses CPAP faithfully    Medications:  Scheduled:  . amiodarone  400 mg Oral BID  . aspirin  324 mg Oral NOW   Or  . aspirin  300 mg Rectal NOW  . aspirin EC  81 mg Oral Daily  . atorvastatin  80 mg Oral q1800  . enalapril  5 mg Oral Daily  . loratadine  10 mg Oral Daily  . metoprolol  200 mg Oral Daily  . pantoprazole  40 mg Oral Daily    Assessment: Patient is a 37 yof that presented to the ED with CP. The patient has had a previous CABG. Patient did have a slight elevation in her trop. Pharmacy has been asked to dose heparin at this time for  CP/ACS.  Heparin level continues to be therapeutic this afternoon. No bleeding issues noted.   Goal of Therapy:  Heparin level 0.3-0.7 units/ml Monitor platelets by anticoagulation protocol: Yes   Plan:  Cont heparin at 950 units/hr  Erin Hearing PharmD., BCPS Clinical Pharmacist 04/24/2019 2:42 PM

## 2019-04-24 NOTE — Progress Notes (Signed)
Pt hr went up to the 140s-150s. Pt stated she just felt hot and tired. Carn[place], MD paged. Awaiting new orders, will continue to monitor.

## 2019-04-24 NOTE — Progress Notes (Signed)
  Echocardiogram 2D Echocardiogram has been performed.  Stephanie Shea M 04/24/2019, 9:17 AM

## 2019-04-24 NOTE — Progress Notes (Signed)
[facility] for Heparin Indication: chest pain/ACS  Allergies  Allergen Reactions  . Codeine Nausea Only    Patient Measurements: Height: 5\' 7"  (170.2 cm) Weight: 82.1 kg (181 lb) IBW/kg (Calculated) : 61.6 Heparin Dosing Weight: 78.5 kg  Vital Signs: Temp: 97.9 F (36.6 C) (04/12 0323) Temp Source: Oral (04/12 0323) BP: 111/53 (04/12 0323) Pulse Rate: 68 (04/12 0503)  Labs: Recent Labs    04/23/19 2003 04/23/19 2238 04/24/19 0508  HGB 13.1  --  12.3  HCT 41.1  --  37.8  PLT 213  --  198  HEPARINUNFRC  --   --  0.64  CREATININE 0.87  --   --   TROPONINIHS 25* 82*  --     Estimated Creatinine Clearance: 54 mL/min (by C-G formula based on SCr of 0.87 mg/dL).   Medical History: Past Medical History:  Diagnosis Date  . Atherosclerosis of native coronary artery 2001   LAD & Cx disease -- BMS PCI in 2001; CABG 2002.  PCI in 2006 (per-op TAH)  . Breast cancer ([place])   . Cervix dysplasia   . GERD (gastroesophageal reflux disease)   . Heart attack ([place]) 2002, 2006   Both events reportedly were perioperatively. She has no recollection. Initial PCI was related to angina symptoms (bilateral arm pain)  . High cholesterol   . Hypertension   . Lumbosacral spinal stenosis   . Osteoarthritis   . Skin cancer   . Sleep apnea    Uses CPAP faithfully    Medications:  Scheduled:  . aspirin  324 mg Oral NOW   Or  . aspirin  300 mg Rectal NOW  . aspirin EC  81 mg Oral Daily  . atorvastatin  80 mg Oral q1800  . enalapril  5 mg Oral Daily  . isosorbide dinitrate  10 mg Oral TID  . loratadine  10 mg Oral Daily  . metoprolol  200 mg Oral Daily  . pantoprazole  40 mg Oral Daily    Assessment: Patient is a 30 yof that presented to the ED with CP. The patient has had a previous CABG. Patient did have a slight elevation in her trop. Pharmacy has been asked to dose heparin at this time for CP/ACS.  4/12 AM update:  Heparin level  therapeutic   Goal of Therapy:  Heparin level 0.3-0.7 units/ml Monitor platelets by anticoagulation protocol: Yes   Plan:  Cont heparin at 950 units/hr 1200 heparin level   Narda Bonds, PharmD, [place] Pharmacist Phone: [phone]

## 2019-04-24 NOTE — Progress Notes (Signed)
Progress Note  Patient Name: Stephanie Shea Date of Encounter: 04/24/2019  Primary Cardiologist: Glenetta Hew, MD   Subjective   Pt denies CP or dyspnea  Inpatient Medications    Scheduled Meds: . aspirin  324 mg Oral NOW   Or  . aspirin  300 mg Rectal NOW  . aspirin EC  81 mg Oral Daily  . atorvastatin  80 mg Oral q1800  . enalapril  5 mg Oral Daily  . isosorbide dinitrate  10 mg Oral TID  . loratadine  10 mg Oral Daily  . metoprolol  200 mg Oral Daily  . pantoprazole  40 mg Oral Daily   Continuous Infusions: . heparin 950 Units/hr (04/24/19 0609)   PRN Meds: acetaminophen, fluticasone, nitroGLYCERIN, ondansetron (ZOFRAN) IV   Vital Signs    Vitals:   04/23/19 2349 04/24/19 0323 04/24/19 0503 04/24/19 0700  BP: (!) 175/69 (!) 111/53  109/67  Pulse: 63 73 68 (!) 139  Resp: 20 (!) 22  19  Temp: 98.2 F (36.8 C) 97.9 F (36.6 C)  98.4 F (36.9 C)  TempSrc: Oral Oral  Oral  SpO2: 95% 98%  98%  Weight:      Height:        Intake/Output Summary (Last 24 hours) at 04/24/2019 0759 Last data filed at 04/24/2019 0609 Gross per 24 hour  Intake 124.15 ml  Output --  Net 124.15 ml   Last 3 Weights 04/23/2019 03/28/2019 02/03/2019  Weight (lbs) 181 lb 181 lb 9.6 oz 183 lb 3.2 oz  Weight (kg) 82.101 kg 82.373 kg 83.099 kg      Telemetry    Sinus with paroxysmal atrial flutter - Personally Reviewed  Physical Exam   GEN: No acute distress.   Neck: No JVD Cardiac: regular and tachycardic Respiratory: Clear to auscultation bilaterally. GI: Soft, nontender, non-distended  MS: No edema Neuro:  Nonfocal  Psych: Normal affect   Labs    High Sensitivity Troponin:   Recent Labs  Lab 04/23/19 2003 04/23/19 2238  TROPONINIHS 25* 82*      Chemistry Recent Labs  Lab 04/23/19 2003 04/24/19 0508  NA 136 142  K 3.7 4.0  CL 102 105  CO2 24 25  GLUCOSE 138* 114*  BUN 14 14  CREATININE 0.87 0.87  CALCIUM 9.1 9.4  PROT 6.7  --   ALBUMIN 3.6  --   AST 22   --   ALT 18  --   ALKPHOS 46  --   BILITOT 1.0  --   GFRNONAA >60 >60  GFRAA >60 >60  ANIONGAP 10 12     Hematology Recent Labs  Lab 04/23/19 2003 04/24/19 0508  WBC 8.2 7.1  RBC 4.08 3.87  HGB 13.1 12.3  HCT 41.1 37.8  MCV 100.7* 97.7  MCH 32.1 31.8  MCHC 31.9 32.5  RDW 13.2 13.2  PLT 213 198    Radiology    DG Chest Port 1 View  Result Date: 04/23/2019 CLINICAL DATA:  Neck and chest tightness. EXAM: PORTABLE CHEST 1 VIEW COMPARISON:  None. FINDINGS: Multiple sternal wires vascular clips are seen. Mild, chronic appearing increased lung markings are seen without evidence of acute infiltrate, pleural effusion or pneumothorax. The heart size and mediastinal contours are within normal limits. There is moderate severity calcification aortic arch. Degenerative changes are seen throughout the thoracic spine. IMPRESSION: 1. Evidence of prior median sternotomy/CABG. 2. No acute or active cardiopulmonary disease. Electronically Signed   By: Joyce Gross.D.  On: 04/23/2019 20:20    Patient Profile     86 y.o. female with past medical history of coronary artery disease status post coronary artery bypass and graft, hypertension, hyperlipidemia admitted with non-ST elevation myocardial infarction and paroxysmal atrial flutter.  Assessment & Plan    1 non-ST elevation myocardial infarction-troponin has increased to 82.  Will await follow-up enzymes.  Continue aspirin, heparin, beta-blocker and statin.  We will plan cardiac catheterization tomorrow.  The risks and benefits including myocardial infarction, CVA and death discussed and she agrees to proceed.  2 paroxysmal atrial flutter-patient has been noted to have intermittent atrial flutter on telemetry.  Blood pressure is borderline.  Will discontinue isosorbide.  We will add amiodarone 400 mg twice daily.  Follow on telemetry.  Schedule echocardiogram to assess LV function.  Check TSH. CHADSvasc 5.  Will need apixaban at  discharge.  We will likely arrange follow-up with electrophysiology to consider ablation.  3 hypertension-patient's blood pressure is controlled.  Systolic is in the [id number] range.  We will hold isosorbide.  Follow blood pressure and adjust regimen as needed.  4 hyperlipidemia-continue statin.  For questions or updates, please contact [place] Please consult [email or web address] for contact info under        Signed, Kirk Ruths, MD  04/24/2019, 7:59 AM

## 2019-04-24 NOTE — H&P (View-Only) (Signed)
Progress Note  Patient Name: Stephanie Shea Date of Encounter: 04/24/2019  Primary Cardiologist: Glenetta Hew, MD   Subjective   Pt denies CP or dyspnea  Inpatient Medications    Scheduled Meds: . aspirin  324 mg Oral NOW   Or  . aspirin  300 mg Rectal NOW  . aspirin EC  81 mg Oral Daily  . atorvastatin  80 mg Oral q1800  . enalapril  5 mg Oral Daily  . isosorbide dinitrate  10 mg Oral TID  . loratadine  10 mg Oral Daily  . metoprolol  200 mg Oral Daily  . pantoprazole  40 mg Oral Daily   Continuous Infusions: . heparin 950 Units/hr (04/24/19 0609)   PRN Meds: acetaminophen, fluticasone, nitroGLYCERIN, ondansetron (ZOFRAN) IV   Vital Signs    Vitals:   04/23/19 2349 04/24/19 0323 04/24/19 0503 04/24/19 0700  BP: (!) 175/69 (!) 111/53  109/67  Pulse: 63 73 68 (!) 139  Resp: 20 (!) 22  19  Temp: 98.2 F (36.8 C) 97.9 F (36.6 C)  98.4 F (36.9 C)  TempSrc: Oral Oral  Oral  SpO2: 95% 98%  98%  Weight:      Height:        Intake/Output Summary (Last 24 hours) at 04/24/2019 0759 Last data filed at 04/24/2019 0609 Gross per 24 hour  Intake 124.15 ml  Output --  Net 124.15 ml   Last 3 Weights 04/23/2019 03/28/2019 02/03/2019  Weight (lbs) 181 lb 181 lb 9.6 oz 183 lb 3.2 oz  Weight (kg) 82.101 kg 82.373 kg 83.099 kg      Telemetry    Sinus with paroxysmal atrial flutter - Personally Reviewed  Physical Exam   GEN: No acute distress.   Neck: No JVD Cardiac: regular and tachycardic Respiratory: Clear to auscultation bilaterally. GI: Soft, nontender, non-distended  MS: No edema Neuro:  Nonfocal  Psych: Normal affect   Labs    High Sensitivity Troponin:   Recent Labs  Lab 04/23/19 2003 04/23/19 2238  TROPONINIHS 25* 82*      Chemistry Recent Labs  Lab 04/23/19 2003 04/24/19 0508  NA 136 142  K 3.7 4.0  CL 102 105  CO2 24 25  GLUCOSE 138* 114*  BUN 14 14  CREATININE 0.87 0.87  CALCIUM 9.1 9.4  PROT 6.7  --   ALBUMIN 3.6  --   AST 22   --   ALT 18  --   ALKPHOS 46  --   BILITOT 1.0  --   GFRNONAA >60 >60  GFRAA >60 >60  ANIONGAP 10 12     Hematology Recent Labs  Lab 04/23/19 2003 04/24/19 0508  WBC 8.2 7.1  RBC 4.08 3.87  HGB 13.1 12.3  HCT 41.1 37.8  MCV 100.7* 97.7  MCH 32.1 31.8  MCHC 31.9 32.5  RDW 13.2 13.2  PLT 213 198    Radiology    DG Chest Port 1 View  Result Date: 04/23/2019 CLINICAL DATA:  Neck and chest tightness. EXAM: PORTABLE CHEST 1 VIEW COMPARISON:  None. FINDINGS: Multiple sternal wires vascular clips are seen. Mild, chronic appearing increased lung markings are seen without evidence of acute infiltrate, pleural effusion or pneumothorax. The heart size and mediastinal contours are within normal limits. There is moderate severity calcification aortic arch. Degenerative changes are seen throughout the thoracic spine. IMPRESSION: 1. Evidence of prior median sternotomy/CABG. 2. No acute or active cardiopulmonary disease. Electronically Signed   By: Joyce Gross.D.  On: 04/23/2019 20:20    Patient Profile     86 y.o. female with past medical history of coronary artery disease status post coronary artery bypass and graft, hypertension, hyperlipidemia admitted with non-ST elevation myocardial infarction and paroxysmal atrial flutter.  Assessment & Plan    1 non-ST elevation myocardial infarction-troponin has increased to 82.  Will await follow-up enzymes.  Continue aspirin, heparin, beta-blocker and statin.  We will plan cardiac catheterization tomorrow.  The risks and benefits including myocardial infarction, CVA and death discussed and she agrees to proceed.  2 paroxysmal atrial flutter-patient has been noted to have intermittent atrial flutter on telemetry.  Blood pressure is borderline.  Will discontinue isosorbide.  We will add amiodarone 400 mg twice daily.  Follow on telemetry.  Schedule echocardiogram to assess LV function.  Check TSH. CHADSvasc 5.  Will need apixaban at  discharge.  We will likely arrange follow-up with electrophysiology to consider ablation.  3 hypertension-patient's blood pressure is controlled.  Systolic is in the [id number] range.  We will hold isosorbide.  Follow blood pressure and adjust regimen as needed.  4 hyperlipidemia-continue statin.  For questions or updates, please contact [employer] Please consult [email or web address] for contact info under        Signed, Kirk Ruths, MD  04/24/2019, 7:59 AM

## 2019-04-24 NOTE — Significant Event (Signed)
Cardiology Moonlighter Note  Paged by care nurse about patient's heart rate, which is in the 130s. She is asymptomatic from this currently. Awaiting ECG. Will make day team aware of arrhythmia as possible driver of her chest pain and elevated troponin.   Marcie Mowers, MD Cardiology Fellow, PGY-7

## 2019-04-25 ENCOUNTER — Encounter (HOSPITAL_COMMUNITY)
Admission: EM | Disposition: A | Discharge status: Home / Self Care | Payer: Self-pay | Source: Home / Self Care | Attending: Internal Medicine

## 2019-04-25 DIAGNOSIS — R072 Precordial pain: Secondary | ICD-10-CM

## 2019-04-25 DIAGNOSIS — I249 Acute ischemic heart disease, unspecified: Secondary | ICD-10-CM | POA: Insufficient documentation

## 2019-04-25 DIAGNOSIS — I4892 Unspecified atrial flutter: Secondary | ICD-10-CM

## 2019-04-25 DIAGNOSIS — R778 Other specified abnormalities of plasma proteins: Secondary | ICD-10-CM

## 2019-04-25 DIAGNOSIS — I25709 Atherosclerosis of coronary artery bypass graft(s), unspecified, with unspecified angina pectoris: Secondary | ICD-10-CM

## 2019-04-25 DIAGNOSIS — I25119 Atherosclerotic heart disease of native coronary artery with unspecified angina pectoris: Secondary | ICD-10-CM

## 2019-04-25 DIAGNOSIS — I480 Paroxysmal atrial fibrillation: Secondary | ICD-10-CM

## 2019-04-25 HISTORY — PX: LEFT HEART CATH AND CORS/GRAFTS ANGIOGRAPHY: CATH118250

## 2019-04-25 LAB — CBC
HCT: 37.9 % (ref 36.0–46.0)
Hemoglobin: 12.2 g/dL (ref 12.0–15.0)
MCH: 32.1 pg (ref 26.0–34.0)
MCHC: 32.2 g/dL (ref 30.0–36.0)
MCV: 99.7 fL (ref 80.0–100.0)
Platelets: 200 10*3/uL (ref 150–400)
RBC: 3.8 MIL/uL — ABNORMAL LOW (ref 3.87–5.11)
RDW: 13.5 % (ref 11.5–15.5)
WBC: 7.9 10*3/uL (ref 4.0–10.5)
nRBC: 0 % (ref 0.0–0.2)

## 2019-04-25 LAB — BASIC METABOLIC PANEL
Anion gap: 9 (ref 5–15)
BUN: 13 mg/dL (ref 8–23)
CO2: 26 mmol/L (ref 22–32)
Calcium: 9.4 mg/dL (ref 8.9–10.3)
Chloride: 105 mmol/L (ref 98–111)
Creatinine, Ser: 0.87 mg/dL (ref 0.44–1.00)
GFR calc Af Amer: >60 mL/min (ref >60)
GFR calc non Af Amer: >60 mL/min (ref >60)
Glucose, Bld: 114 mg/dL — ABNORMAL HIGH (ref 70–99)
Potassium: 4.3 mmol/L (ref 3.5–5.1)
Sodium: 140 mmol/L (ref 135–145)

## 2019-04-25 LAB — HEPARIN LEVEL (UNFRACTIONATED): Heparin Unfractionated: 0.78 IU/mL — ABNORMAL HIGH (ref 0.30–0.70)

## 2019-04-25 LAB — TSH: TSH: 2.89 u[IU]/mL (ref 0.350–4.500)

## 2019-04-25 SURGERY — LEFT HEART CATH AND CORS/GRAFTS ANGIOGRAPHY
Anesthesia: LOCAL

## 2019-04-25 MED ORDER — ACETAMINOPHEN 325 MG PO TABS: 650.0000 mg | ORAL_TABLET | ORAL | Status: DC | PRN

## 2019-04-25 MED ORDER — HEPARIN (PORCINE) IN NACL 1000-0.9 UT/500ML-% IV SOLN
INTRAVENOUS | Status: AC
  Filled 2019-04-25: qty 1000

## 2019-04-25 MED ORDER — HYDRALAZINE HCL 20 MG/ML IJ SOLN: 10.0000 mg | INTRAMUSCULAR | Status: AC | PRN

## 2019-04-25 MED ORDER — LIDOCAINE HCL (PF) 1 % IJ SOLN
INTRAMUSCULAR | Status: DC | PRN
  Administered 2019-04-25: 14 mL

## 2019-04-25 MED ORDER — SODIUM CHLORIDE 0.9 % IV SOLN: 250.0000 mL | INTRAVENOUS | Status: DC | PRN

## 2019-04-25 MED ORDER — MIDAZOLAM HCL 2 MG/2ML IJ SOLN
INTRAMUSCULAR | Status: DC | PRN
  Administered 2019-04-25: 1 mg via INTRAVENOUS

## 2019-04-25 MED ORDER — SODIUM CHLORIDE 0.9 % WEIGHT BASED INFUSION
3.0000 mL/kg/h | INTRAVENOUS | Status: DC
  Administered 2019-04-25: 3 mL/kg/h via INTRAVENOUS

## 2019-04-25 MED ORDER — APIXABAN 5 MG PO TABS: 5.0000 mg | ORAL_TABLET | Freq: Two times a day (BID) | ORAL | 2 refills | Status: DC

## 2019-04-25 MED ORDER — ONDANSETRON HCL 4 MG/2ML IJ SOLN: 4.0000 mg | Freq: Four times a day (QID) | INTRAMUSCULAR | Status: DC | PRN

## 2019-04-25 MED ORDER — SODIUM CHLORIDE 0.9% FLUSH: 3.0000 mL | Freq: Two times a day (BID) | INTRAVENOUS | Status: DC

## 2019-04-25 MED ORDER — SODIUM CHLORIDE 0.9% FLUSH: 3.0000 mL | INTRAVENOUS | Status: DC | PRN

## 2019-04-25 MED ORDER — IOHEXOL 350 MG/ML SOLN
INTRAVENOUS | Status: DC | PRN
  Administered 2019-04-25: 105 mL

## 2019-04-25 MED ORDER — SODIUM CHLORIDE 0.9 % WEIGHT BASED INFUSION
1.0000 mL/kg/h | INTRAVENOUS | Status: DC
  Administered 2019-04-25: 06:00:00 1 mL/kg/h via INTRAVENOUS

## 2019-04-25 MED ORDER — MIDAZOLAM HCL 2 MG/2ML IJ SOLN
INTRAMUSCULAR | Status: AC
  Filled 2019-04-25: qty 2

## 2019-04-25 MED ORDER — ENOXAPARIN SODIUM 40 MG/0.4ML ~~LOC~~ SOLN: 40.0000 mg | SUBCUTANEOUS | Status: DC

## 2019-04-25 MED ORDER — ATORVASTATIN CALCIUM 80 MG PO TABS: 80.0000 mg | ORAL_TABLET | Freq: Every day | ORAL | Status: DC

## 2019-04-25 MED ORDER — HEPARIN (PORCINE) IN NACL 1000-0.9 UT/500ML-% IV SOLN
INTRAVENOUS | Status: DC | PRN
  Administered 2019-04-25 (×2): 500 mL

## 2019-04-25 MED ORDER — LABETALOL HCL 5 MG/ML IV SOLN: 10.0000 mg | INTRAVENOUS | Status: AC | PRN

## 2019-04-25 MED ORDER — DIAZEPAM 5 MG PO TABS: 5.0000 mg | ORAL_TABLET | ORAL | Status: DC | PRN

## 2019-04-25 MED ORDER — LIDOCAINE HCL (PF) 1 % IJ SOLN
INTRAMUSCULAR | Status: AC
  Filled 2019-04-25: qty 30

## 2019-04-25 MED ORDER — FENTANYL CITRATE (PF) 100 MCG/2ML IJ SOLN
INTRAMUSCULAR | Status: AC
  Filled 2019-04-25: qty 2

## 2019-04-25 MED ORDER — SODIUM CHLORIDE 0.9 % IV SOLN: INTRAVENOUS | Status: DC

## 2019-04-25 MED ORDER — IOHEXOL 350 MG/ML SOLN
INTRAVENOUS | Status: AC
  Filled 2019-04-25: qty 1

## 2019-04-25 MED ORDER — FENTANYL CITRATE (PF) 100 MCG/2ML IJ SOLN
INTRAMUSCULAR | Status: DC | PRN
  Administered 2019-04-25: 25 ug via INTRAVENOUS

## 2019-04-25 MED ORDER — ASPIRIN 81 MG PO CHEW: 81.0000 mg | CHEWABLE_TABLET | Freq: Every day | ORAL | Status: DC

## 2019-04-25 MED ORDER — AMIODARONE HCL 200 MG PO TABS: ORAL_TABLET | ORAL | 2 refills | Status: DC

## 2019-04-25 MED FILL — AMIODARONE HCL 200 MG TAB: 200 | 30 days supply | Qty: 50 | Fill #0

## 2019-04-25 MED FILL — ELIQUIS 5 MG TABLET: 5 | 30 days supply | Qty: 60 | Fill #0

## 2019-04-25 SURGICAL SUPPLY — 8 items
CATH INFINITI 5FR MULTPACK ANG (CATHETERS) ×1 IMPLANT
KIT HEART LEFT (KITS) ×2 IMPLANT
PACK CARDIAC CATHETERIZATION (CUSTOM PROCEDURE TRAY) ×2 IMPLANT
SHEATH PINNACLE 5F 10CM (SHEATH) ×1 IMPLANT
SHEATH PROBE COVER 6X72 (BAG) ×1 IMPLANT
TRANSDUCER W/STOPCOCK (MISCELLANEOUS) ×2 IMPLANT
TUBING CIL FLEX 10 FLL-RA (TUBING) ×2 IMPLANT
WIRE EMERALD 3MM-J .035X150CM (WIRE) ×1 IMPLANT

## 2019-04-25 NOTE — Discharge Instructions (Signed)
Medication Changes: - START Amiodarone. You should take 2 tablets (400mg ) twice daily for 1 week. Then on Monday 05/01/2019, you will reduce the dose and start 1 tablet (200mg ) once daily.  - STOP Aspirin. - START Eliquis 5mg  twice daily tomorrow morning (04/26/2019).  - STOP Meloxicam and Naproxen as these may increase your risk of bleeding on Eliquis.   Post Cardiac Catheterization: NO HEAVY LIFTING OR SEXUAL ACTIVITY X 7 DAYS. NO DRIVING X 2-3 DAYS. NO SOAKING BATHS, HOT TUBS, POOLS, ETC., X 7 DAYS.  Groin Site Care: Refer to this sheet in the next few weeks. These instructions provide you with information on caring for yourself after your procedure. Your caregiver may also give you more specific instructions. Your treatment has been planned according to current medical practices, but problems sometimes occur. Call your caregiver if you have any problems or questions after your procedure. HOME CARE INSTRUCTIONS  You may shower 24 hours after the procedure. Remove the bandage (dressing) and gently wash the site with plain soap and water. Gently pat the site dry.   Do not apply powder or lotion to the site.   Do not sit in a bathtub, swimming pool, or whirlpool for 5 to 7 days.   No bending, squatting, or lifting anything over 10 pounds (4.5 kg) as directed by your caregiver.   Inspect the site at least twice daily.   Do not drive home if you are discharged the same day of the procedure. Have someone else drive you.  What to expect:  Any bruising will usually fade within 1 to 2 weeks.   Blood that collects in the tissue (hematoma) may be painful to the touch. It should usually decrease in size and tenderness within 1 to 2 weeks.  SEEK IMMEDIATE MEDICAL CARE IF:  You have unusual pain at the groin site or down the affected leg.   You have redness, warmth, swelling, or pain at the groin site.   You have drainage (other than a small amount of blood on the dressing).   You have  chills.   You have a fever or persistent symptoms for more than 72 hours.   You have a fever and your symptoms suddenly get worse.   Your leg becomes pale, cool, tingly, or numb.   You have heavy bleeding from the site. Hold pressure on the site.

## 2019-04-25 NOTE — Care Management (Signed)
Per Jamine H. W/ [employer] co-pay amount  for Eliquis 2.5mg  and or 5mg . bid for a 30 day supply $47.00 .  No PA required No deductible Tier 3 Retail [facility] : [facility]  Mail order [facility]: Eliquis 2.5mg  and or 5mg . for a 90 day supply bid is $117.50.  Ref,# [id number]

## 2019-04-25 NOTE — Progress Notes (Signed)
Right Groin Arterial Sheath Pull Pressure held for 25 minutes Level 0 pre and post Faint bruising below site Bedrest started at 0910. Instructions given to patient for post sheath removal Right DP palpable

## 2019-04-25 NOTE — Interval H&P Note (Signed)
Cath Lab Visit (complete for each Cath Lab visit)  Clinical Evaluation Leading to the Procedure:   ACS: No.  Non-ACS:    Anginal Classification: CCS III  Anti-ischemic medical therapy: Maximal Therapy (2 or more classes of medications)  Non-Invasive Test Results: No non-invasive testing performed  Prior CABG: Previous CABG      History and Physical Interval Note:  04/25/2019 7:44 AM  Stephanie Shea  has presented today for surgery, with the diagnosis of n stemi.  The various methods of treatment have been discussed with the patient and family. After consideration of risks, benefits and other options for treatment, the patient has consented to  Procedure(s): LEFT HEART CATH AND CORS/GRAFTS ANGIOGRAPHY (N/A) as a surgical intervention.  The patient's history has been reviewed, patient examined, no change in status, stable for surgery.  I have reviewed the patient's chart and labs.  Questions were answered to the patient's satisfaction.     Shelva Majestic

## 2019-04-25 NOTE — Progress Notes (Signed)
Ambulated along the hallway with front wheel walker, at first she claimed she feels weak and managed only about 200 feet. Right groin clean and dry, no bleeding noted. Continue to monitor.

## 2019-04-25 NOTE — Plan of Care (Signed)

## 2019-04-25 NOTE — TOC Progression Note (Signed)
Transition of Care [facility]) - Progression Note    Patient Details  Name: Stephanie Shea MRN: [id number] Date of Birth: June 15, 1933  Transition of Care [facility] CM/SW Contact  Zenon Mayo, RN Phone Number: 04/25/2019, 9:43 PM  Clinical Narrative:    Patient is from [facility], for dc today, NCM informed her and daughter at the bedside about the refill cost for eliquis estimate of 47.00 for 30 day supply.  [facility], no other needs.        Expected Discharge Plan and Services           Expected Discharge Date: 04/25/19                                     Social Determinants of Health (SDOH) Interventions    Readmission Risk Interventions No flowsheet data found.

## 2019-04-25 NOTE — Progress Notes (Signed)
Discharged home accompanied by daughter, discharge instructions given to pt.

## 2019-04-25 NOTE — Discharge Summary (Signed)
Discharge Summary    Patient ID: Stephanie Shea MRN: [id number]; DOB: 1933-01-14  Admit date: 04/23/2019 Discharge date: 04/25/2019  Primary Care Provider: Mast, [name], NP  Primary Cardiologist: Glenetta Hew, MD  Primary Electrophysiologist:  None   Discharge Diagnoses    Principal Problem:   Chest pain Active Problems:   Coronary artery disease involving coronary bypass graft of native heart with angina pectoris [facility])   Elevated troponin   Atrial flutter/fibrillation   Essential hypertension   Hyperlipidemia   Diagnostic Studies/Procedures    Echocardiogram 04/24/2019: Impressions: 1. Left ventricular ejection fraction, by estimation, is 65 to 70%. The  left ventricle has normal function. The left ventricle has no regional  wall motion abnormalities. There is moderate asymmetric left ventricular  hypertrophy of the septal segment.  Left ventricular diastolic parameters are consistent with Grade I  diastolic dysfunction (impaired relaxation). Elevated left ventricular  end-diastolic pressure.  2. Moderate septal hypertrophy with otherwise mild concentric LVH.  3. Right ventricular systolic function is normal. The right ventricular  size is normal. There is normal pulmonary artery systolic pressure.  4. The mitral valve is normal in structure. No evidence of mitral valve  regurgitation. No evidence of mitral stenosis.  5. The aortic valve is tricuspid. Aortic valve regurgitation is not  visualized. No aortic stenosis is present.  6. The inferior vena cava is normal in size with greater than 50%  respiratory variability, suggesting right atrial pressure of 3 mmHg.  _______________  Left Heart Catheterization 04/25/2019:  Ost Cx to Prox Cx lesion is 100% stenosed.  Prox RCA lesion is 30% stenosed.  Origin to Prox Graft lesion is 30% stenosed.  Origin lesion is 100% stenosed.  1st Diag lesion is 100% stenosed.  Ost LAD to Prox LAD lesion is 30% stenosed.   Multivessel CAD with ostial stenting of the LAD with 30% intimal hyperplasia and a patent LIMA graft which fills retrograde up to its insertion from the native LAD injection. The distal LAD is free of significant disease. The very proximal diagonal vessel is occluded; total occlusion of the left circumflex ostial stent; dominant RCA with smooth 30% proximal stenosis.  Patent LIMA graft supplying the mid LAD. Angiography into the left subclavian artery-year-old eccentric plaque shortly after takeoff from the aorta without significant obstruction. The ostium of the LIMA graft was widely patent.  Patent vein graft supplying the circumflex marginal vessel with previously placed ostial stent with 30% intimal hyperplasia within the stented segment.  Occluded vein graft which had supplied the diagonal vessel.  LVEDP 16 mmHg.  Recommendation: Medical therapy.  Diagnostic Dominance: Right      History of Present Illness     Stephanie Shea is a 86 y.o. female with with a history of CAD s/p CABG in 2002 and subsequent PCI, obstructive sleep apnea on CPAP, hypertension, and hyperlipidemia who is followed by Dr. Ellyn Hack.   Patient reported to the [facility] ED on 04/23/2019 after an episode of chest and neck pain/pressure earlier that evening. She was eating dinner at her assisted living facility when she suddenly felt chest and neck pain/pressure. She states she has had similar episodes before but these have quickly resolved with 1 sublingual Nitroglycerin. However, on evening of presentation, her episode was more severe and lasted longer than before. She took 1 sublingual Nitro and her pain persisted. She contacted her nurse who then called EMS. By the time EMS arrived she was chest pain free. She was brought to the ED for further  workup.  On arrival to the ED the patient's vital signs were within normal limits. An ECG revealed LVH with repolarization abnormalities, similar to prior ECGs. Her  initial troponin was found to be elevated at 25. She was given full dose Aspirin and started on a Heparin drip. She remained chest pain free throughout the duration of her ED stay.   Notably, the patient states she is in good health and functionally independent. She is able to do everything that she wants to do without limiting angina or shortness of breath. She takes all of her medications as prescribed without any missed doses. She does not smoke or drink alcohol.   Hospital Course     Consultants: None  Chest Pain with Elevated Troponin Patient admitted with chest pain as above. High-sensitivity troponin peaked at 89. Echo showed LVEF of 65-70% with normal wall moderate LVH of the septal segment with otherwise mild concentric LVH, grade 1 diastolic CHF, and elevated LVEDP. Very mild LV intracavitary gradient noted (peak gradient 4 mmHg). Patient underwent left heart catheterization on 04/25/2019 which showed stable CAD with occluded SVG to Diag but patent LIMA to LAD and SVG to CX. LVEDP 16 mmHg. Medical therapy recommended. Patient tolerated procedure well. Right femoral cath site soft with no signs of hematoma. She was able to ambulate without any problems (she felt a little weak with this but no other symptoms). She denies any recurrent chest/neck pain. Symptoms and troponin elevation possibly demand ischemia in setting of atrial flutter/fibrillation with RVR given patient was unaware when she went into this rhythm. Will stop Aspirin given need for Eliquis. Continue beta-blocker and statin.  Paroxysmal Atrial Flutter/Fibrillation Patient present in sinus rhythm; however, early the next morning was noted to be in atrial flutter/fibrillation with rates as high as the 140-150's. TSH normal. She was started on Amiodarone 400mg  twice daily and converted make to normal sinus rhythm which she has maintained. Rates currently in the high 50's to 60's. Will continue Amiodarone 400mg  twice daily for 1 week  and then transition to 200mg  daily on 05/01/2019. Continue Toprol-XL 200mg  daily. Will start Eliquis 5mg  twice daily tomorrow morning. Will repeat EKG at follow-up visit and make sure patient is not too bradycardic.   Hypertension Continue home Enalapril 5mg  daily and Toprol-XL 200mg  daily.  Hyperlipidemia Continue home Lipitor 80mg  daily.   Patient seen and examined by Dr. Stanford Breed today and determined to be stable for discharge. Outpatient follow-up has been arranged. Medications as below.   Did the patient have an acute coronary syndrome (MI, NSTEMI, STEMI, etc) this admission?:  No.   The elevated Troponin was due to the acute medical illness (demand ischemia).  _____________  Discharge Vitals Blood pressure 138/72, pulse 61, temperature 97.8 F (36.6 C), temperature source Oral, resp. rate (!) 23, height 5\' 7"  (1.702 m), weight 81.6 kg, SpO2 99 %.  Filed Weights   04/23/19 1952 04/25/19 0300  Weight: 82.1 kg 81.6 kg    Labs & Radiologic Studies    CBC Recent Labs    04/24/19 0508 04/25/19 0616  WBC 7.1 7.9  HGB 12.3 12.2  HCT 37.8 37.9  MCV 97.7 99.7  PLT 198 [id number]   Basic Metabolic Panel Recent Labs    04/24/19 0508 04/25/19 0616  NA 142 140  K 4.0 4.3  CL 105 105  CO2 25 26  GLUCOSE 114* 114*  BUN 14 13  CREATININE 0.87 0.87  CALCIUM 9.4 9.4   Liver Function Tests Recent Labs  04/23/19 2003  AST 22  ALT 18  ALKPHOS 46  BILITOT 1.0  PROT 6.7  ALBUMIN 3.6   No results for input(s): LIPASE, AMYLASE in the last 72 hours. High Sensitivity Troponin:   Recent Labs  Lab 04/23/19 2003 04/23/19 2238  TROPONINIHS 25* 82*    BNP Invalid input(s): POCBNP D-Dimer No results for input(s): DDIMER in the last 72 hours. Hemoglobin A1C No results for input(s): HGBA1C in the last 72 hours. Fasting Lipid Panel No results for input(s): CHOL, HDL, LDLCALC, TRIG, CHOLHDL, LDLDIRECT in the last 72 hours. Thyroid Function Tests No results for input(s): TSH,  T4TOTAL, T3FREE, THYROIDAB in the last 72 hours.  Invalid input(s): FREET3 _____________  CARDIAC CATHETERIZATION  Result Date: 04/25/2019  Ost Cx to Prox Cx lesion is 100% stenosed.  Prox RCA lesion is 30% stenosed.  Origin to Prox Graft lesion is 30% stenosed.  Origin lesion is 100% stenosed.  1st Diag lesion is 100% stenosed.  Ost LAD to Prox LAD lesion is 30% stenosed.  Multivessel CAD with ostial stenting of the LAD with 30% intimal hyperplasia and a patent LIMA graft which fills retrograde up to its insertion from the native LAD injection.  The distal LAD is free of significant disease.  The very proximal diagonal vessel is occluded; total occlusion of the left circumflex ostial stent; dominant RCA with smooth 30% proximal stenosis. Patent LIMA graft supplying the mid LAD.  Angiography into the left subclavian artery-year-old eccentric plaque shortly after takeoff from the aorta without significant obstruction.  The ostium of the LIMA graft was widely patent. Patent vein graft supplying the circumflex marginal vessel with previously placed ostial stent with 30% intimal hyperplasia within the stented segment. Occluded vein graft which had supplied the diagonal vessel. LVEDP 16 mmHg. RECOMMENDATION: Medical therapy.  DG Chest Port 1 View  Result Date: 04/23/2019 CLINICAL DATA:  Neck and chest tightness. EXAM: PORTABLE CHEST 1 VIEW COMPARISON:  None. FINDINGS: Multiple sternal wires vascular clips are seen. Mild, chronic appearing increased lung markings are seen without evidence of acute infiltrate, pleural effusion or pneumothorax. The heart size and mediastinal contours are within normal limits. There is moderate severity calcification aortic arch. Degenerative changes are seen throughout the thoracic spine. IMPRESSION: 1. Evidence of prior median sternotomy/CABG. 2. No acute or active cardiopulmonary disease. Electronically Signed   By: Virgina Norfolk M.D.   On: 04/23/2019 20:20    ECHOCARDIOGRAM COMPLETE  Result Date: 04/24/2019    ECHOCARDIOGRAM REPORT   Patient Name:   Stephanie Shea Date of Exam: 04/24/2019 Medical Rec #:  [id number]     Height:       67.0 in Accession #:    [id number]    Weight:       181.0 lb Date of Birth:  [birth year]    BSA:          1.938 m Patient Age:    42 years      BP:           109/67 mmHg Patient Gender: F             HR:           139 bpm. Exam Location:  Inpatient Procedure: 2D Echo Indications:    Acute Cornonary Syndrome I24.9  History:        Patient has prior history of Echocardiogram examinations. CAD                 and non-ST elevation myocardial infarction,  Prior CABG,                 Arrythmias:Atrial Flutter; Risk Factors:Hypertension and                 Dyslipidemia. GERD.  Sonographer:    Darlina Sicilian RDCS Referring Phys: [id number] [place]  1. Left ventricular ejection fraction, by estimation, is 65 to 70%. The left ventricle has normal function. The left ventricle has no regional wall motion abnormalities. There is moderate asymmetric left ventricular hypertrophy of the septal segment. Left ventricular diastolic parameters are consistent with Grade I diastolic dysfunction (impaired relaxation). Elevated left ventricular end-diastolic pressure.  2. Moderate septal hypertrophy with otherwise mild concentric LVH.  3. Right ventricular systolic function is normal. The right ventricular size is normal. There is normal pulmonary artery systolic pressure.  4. The mitral valve is normal in structure. No evidence of mitral valve regurgitation. No evidence of mitral stenosis.  5. The aortic valve is tricuspid. Aortic valve regurgitation is not visualized. No aortic stenosis is present.  6. The inferior vena cava is normal in size with greater than 50% respiratory variability, suggesting right atrial pressure of 3 mmHg. FINDINGS  Left Ventricle: Left ventricular ejection fraction, by estimation, is 65 to 70%. The left  ventricle has normal function. The left ventricle has no regional wall motion abnormalities. The left ventricular internal cavity size was normal in size. There is  moderate asymmetric left ventricular hypertrophy of the septal segment. Left ventricular diastolic parameters are consistent with Grade I diastolic dysfunction (impaired relaxation). Elevated left ventricular end-diastolic pressure. Right Ventricle: The right ventricular size is normal. No increase in right ventricular wall thickness. Right ventricular systolic function is normal. There is normal pulmonary artery systolic pressure. The tricuspid regurgitant velocity is 1.35 m/s, and  with an assumed right atrial pressure of 3 mmHg, the estimated right ventricular systolic pressure is 123456 mmHg. Left Atrium: Left atrial size was normal in size. Right Atrium: Right atrial size was normal in size. Pericardium: There is no evidence of pericardial effusion. Mitral Valve: The mitral valve is normal in structure. Normal mobility of the mitral valve leaflets. No evidence of mitral valve regurgitation. No evidence of mitral valve stenosis. Tricuspid Valve: The tricuspid valve is normal in structure. Tricuspid valve regurgitation is trivial. No evidence of tricuspid stenosis. Aortic Valve: The aortic valve is tricuspid. Aortic valve regurgitation is not visualized. No aortic stenosis is present. Pulmonic Valve: The pulmonic valve was normal in structure. Pulmonic valve regurgitation is not visualized. No evidence of pulmonic stenosis. Aorta: The aortic root is normal in size and structure. Venous: The inferior vena cava is normal in size with greater than 50% respiratory variability, suggesting right atrial pressure of 3 mmHg. IAS/Shunts: No atrial level shunt detected by color flow Doppler. Additional Comments: Very mild left ventricular intracavitary gradient noted. Peak velocity 1.0 m/s. Peak gradient 4 mmHg.  LEFT VENTRICLE PLAX 2D LVIDd:         3.40 cm   Diastology LVIDs:         2.10 cm  LV e' lateral:   7.05 cm/s LV PW:         1.10 cm  LV E/e' lateral: 9.2 LV IVS:        1.60 cm  LV e' medial:    4.22 cm/s LVOT diam:     1.70 cm  LV E/e' medial:  15.4 LV SV:         44 LV  SV Index:   23 LVOT Area:     2.27 cm  RIGHT VENTRICLE RV S prime:     10.90 cm/s TAPSE (M-mode): 1.6 cm LEFT ATRIUM             Index LA diam:        3.30 cm 1.70 cm/m LA Vol (A2C):   43.9 ml 22.65 ml/m LA Vol (A4C):   49.7 ml 25.64 ml/m LA Biplane Vol: 47.2 ml 24.35 ml/m  AORTIC VALVE LVOT Vmax:   103.00 cm/s LVOT Vmean:  65.600 cm/s LVOT VTI:    0.194 m  AORTA Ao Root diam: 2.80 cm MITRAL VALVE               TRICUSPID VALVE MV Area (PHT): 2.66 cm    TR Peak grad:   7.3 mmHg MV Decel Time: 285 msec    TR Vmax:        135.00 cm/s MV E velocity: 64.80 cm/s MV A velocity: 93.60 cm/s  SHUNTS MV E/A ratio:  0.69        Systemic VTI:  0.19 m                            Systemic Diam: 1.70 cm Skeet Latch MD Electronically signed by Skeet Latch MD Signature Date/Time: 04/24/2019/11:40:54 AM    Final    Disposition   Patient is being discharged home today in good condition.  Follow-up Plans & Appointments    Follow-up Information    Lendon Colonel, NP Follow up.   Specialties: Nurse Practitioner, Radiology, Cardiology Why: Hospital follow-up scheduled for 05/03/2019 at 2:15am. Please arrive 15 minutes early for check-in. If this date/time does not work for you, please call our office to reschedule.  Contact information: [address] [place] [address] [phone]          Discharge Instructions    Diet - low sodium heart healthy   Complete by: As directed    Increase activity slowly   Complete by: As directed       Discharge Medications   Allergies as of 04/25/2019      Reactions   Codeine Nausea Only      Medication List    STOP taking these medications   aspirin EC 81 MG tablet   meloxicam 7.5 MG tablet Commonly known as:  MOBIC   naproxen sodium 220 MG tablet Commonly known as: ALEVE     TAKE these medications   acetaminophen 500 MG tablet Commonly known as: TYLENOL Take 500 mg by mouth at bedtime as needed (for mild pain and "sleep").   amiodarone 200 MG tablet Commonly known as: PACERONE Take 2 tablets (400mg ) twice daily for 1 week. Then on 05/01/2019, transition to taking only 1 tablet (200mg ) once daily.   ammonium lactate 12 % cream Commonly known as: AMLACTIN Apply topically as needed for dry skin.   apixaban 5 MG Tabs tablet Commonly known as: Eliquis Take 1 tablet (5 mg total) by mouth 2 (two) times daily. Start taking on: April 26, 2019   atorvastatin 80 MG tablet Commonly known as: LIPITOR TAKE 1 TABLET DAILY What changed: when to take this   Benefiber Powd 2 spoonful mixed in water 3 times a day What changed:   how to take this  when to take this  additional instructions   enalapril 5 MG tablet Commonly known as: VASOTEC TAKE 1 TABLET DAILY  fexofenadine 180 MG tablet Commonly known as: ALLEGRA Take 180 mg by mouth daily as needed for allergies or rhinitis.   fluticasone 50 MCG/ACT nasal spray Commonly known as: FLONASE Place 1 spray into both nostrils daily as needed for allergies or rhinitis.   metoprolol 200 MG 24 hr tablet Commonly known as: TOPROL-XL TAKE 1 TABLET BY MOUTH EVERY DAY   nitroGLYCERIN 0.4 MG SL tablet Commonly known as: NITROSTAT PLACE 1 TABLET UNDER TONGUE EVERY 5 MINUTES FOR 3 DOSES FOR CHEST PAIN What changed: See the new instructions.   omeprazole 20 MG tablet Commonly known as: PRILOSEC OTC Take 20 mg by mouth daily before breakfast.   Vitamin D3 50 MCG (2000 UT) Tabs Take 2,000 Units by mouth daily after breakfast.          Outstanding Labs/Studies   Repeat EKG at follow-up visit.   Duration of Discharge Encounter   Greater than 30 minutes including physician time.  Signed, Darreld Mclean, PA-C 04/25/2019, 11:23 AM

## 2019-04-25 NOTE — Progress Notes (Addendum)
Progress Note  Patient Name: Stephanie Shea Date of Encounter: 04/25/2019  Primary Cardiologist: Glenetta Hew, MD   Subjective   Patient states she feels great. Tolerated cath well. No chest pain, shortness of breath, or palpitations. Has been maintaining sinus rhythm since yesterday morning.   Inpatient Medications    Scheduled Meds: . amiodarone  400 mg Oral BID  . [START ON 04/26/2019] aspirin  81 mg Oral Daily  . aspirin  300 mg Rectal NOW  . atorvastatin  80 mg Oral q1800  . enalapril  5 mg Oral Daily  . [START ON 04/26/2019] enoxaparin (LOVENOX) injection  40 mg Subcutaneous Q24H  . loratadine  10 mg Oral Daily  . metoprolol  200 mg Oral Daily  . pantoprazole  40 mg Oral Daily  . sodium chloride flush  3 mL Intravenous Q12H   Continuous Infusions: . sodium chloride    . sodium chloride     PRN Meds: sodium chloride, acetaminophen, diazepam, fluticasone, hydrALAZINE, labetalol, nitroGLYCERIN, ondansetron (ZOFRAN) IV, sodium chloride flush   Vital Signs    Vitals:   04/25/19 0850 04/25/19 0905 04/25/19 0910 04/25/19 0915  BP: (!) 161/62 (!) 157/60 (!) 160/63 (!) 156/57  Pulse: 63 (!) 56 (!) 57 (!) 57  Resp: 20 15 20  (!) 23  Temp:      TempSrc:      SpO2: 96% 97% 95% 94%  Weight:      Height:        Intake/Output Summary (Last 24 hours) at 04/25/2019 0949 Last data filed at 04/25/2019 0600 Gross per 24 hour  Intake 439.29 ml  Output --  Net 439.29 ml   Last 3 Weights 04/25/2019 04/23/2019 03/28/2019  Weight (lbs) 179 lb 14.3 oz 181 lb 181 lb 9.6 oz  Weight (kg) 81.6 kg 82.101 kg 82.373 kg      Telemetry    Normal sinus rhythm rates in the high 50's to low 60's. - Personally Reviewed  ECG    Normal sinus rhythm with some T wave inversion in leads I and aVL. - Personally Reviewed  Physical Exam   GEN: No acute distress.   Neck: Supple. Cardiac: RRR. Systolic murmur noted at lower sternal border. No rubs or gallops. Right femoral cath site soft with  no signs of hematoma.  Respiratory: No increased work of breathing. Clear to auscultation bilaterally. No significant wheezes, rhonchi, or rales appreciated. GI: Soft, non-distended, and non-tender. MS: No edema. No deformity. Neuro:  No focal deficits. Psych: Normal affect. Responds appropriately.    Labs    High Sensitivity Troponin:   Recent Labs  Lab 04/23/19 2003 04/23/19 2238  TROPONINIHS 25* 82*      Chemistry Recent Labs  Lab 04/23/19 2003 04/24/19 0508 04/25/19 0616  NA 136 142 140  K 3.7 4.0 4.3  CL 102 105 105  CO2 24 25 26   GLUCOSE 138* 114* 114*  BUN 14 14 13   CREATININE 0.87 0.87 0.87  CALCIUM 9.1 9.4 9.4  PROT 6.7  --   --   ALBUMIN 3.6  --   --   AST 22  --   --   ALT 18  --   --   ALKPHOS 46  --   --   BILITOT 1.0  --   --   GFRNONAA >60 >60 >60  GFRAA >60 >60 >60  ANIONGAP 10 12 9      Hematology Recent Labs  Lab 04/23/19 2003 04/24/19 0508 04/25/19 0616  WBC 8.2  7.1 7.9  RBC 4.08 3.87 3.80*  HGB 13.1 12.3 12.2  HCT 41.1 37.8 37.9  MCV 100.7* 97.7 99.7  MCH 32.1 31.8 32.1  MCHC 31.9 32.5 32.2  RDW 13.2 13.2 13.5  PLT 213 198 200    BNPNo results for input(s): BNP, PROBNP in the last 168 hours.   DDimer No results for input(s): DDIMER in the last 168 hours.   Radiology    CARDIAC CATHETERIZATION  Result Date: 04/25/2019  Colon Flattery Cx to Prox Cx lesion is 100% stenosed.  Prox RCA lesion is 30% stenosed.  Origin to Prox Graft lesion is 30% stenosed.  Origin lesion is 100% stenosed.  1st Diag lesion is 100% stenosed.  Ost LAD to Prox LAD lesion is 30% stenosed.  Multivessel CAD with ostial stenting of the LAD with 30% intimal hyperplasia and a patent LIMA graft which fills retrograde up to its insertion from the native LAD injection.  The distal LAD is free of significant disease.  The very proximal diagonal vessel is occluded; total occlusion of the left circumflex ostial stent; dominant RCA with smooth 30% proximal stenosis. Patent  LIMA graft supplying the mid LAD.  Angiography into the left subclavian artery-year-old eccentric plaque shortly after takeoff from the aorta without significant obstruction.  The ostium of the LIMA graft was widely patent. Patent vein graft supplying the circumflex marginal vessel with previously placed ostial stent with 30% intimal hyperplasia within the stented segment. Occluded vein graft which had supplied the diagonal vessel. LVEDP 16 mmHg. RECOMMENDATION: Medical therapy.  DG Chest Port 1 View  Result Date: 04/23/2019 CLINICAL DATA:  Neck and chest tightness. EXAM: PORTABLE CHEST 1 VIEW COMPARISON:  None. FINDINGS: Multiple sternal wires vascular clips are seen. Mild, chronic appearing increased lung markings are seen without evidence of acute infiltrate, pleural effusion or pneumothorax. The heart size and mediastinal contours are within normal limits. There is moderate severity calcification aortic arch. Degenerative changes are seen throughout the thoracic spine. IMPRESSION: 1. Evidence of prior median sternotomy/CABG. 2. No acute or active cardiopulmonary disease. Electronically Signed   By: Virgina Norfolk M.D.   On: 04/23/2019 20:20   ECHOCARDIOGRAM COMPLETE  Result Date: 04/24/2019    ECHOCARDIOGRAM REPORT   Patient Name:   Stephanie Shea Date of Exam: 04/24/2019 Medical Rec #:  [id number]     Height:       67.0 in Accession #:    [id number]    Weight:       181.0 lb Date of Birth:  [birth year]    BSA:          1.938 m Patient Age:    85 years      BP:           109/67 mmHg Patient Gender: F             HR:           139 bpm. Exam Location:  Inpatient Procedure: 2D Echo Indications:    Acute Cornonary Syndrome I24.9  History:        Patient has prior history of Echocardiogram examinations. CAD                 and non-ST elevation myocardial infarction, Prior CABG,                 Arrythmias:Atrial Flutter; Risk Factors:Hypertension and                 Dyslipidemia. GERD.  Sonographer:  Darlina Sicilian RDCS Referring Phys: [id number] [place]  1. Left ventricular ejection fraction, by estimation, is 65 to 70%. The left ventricle has normal function. The left ventricle has no regional wall motion abnormalities. There is moderate asymmetric left ventricular hypertrophy of the septal segment. Left ventricular diastolic parameters are consistent with Grade I diastolic dysfunction (impaired relaxation). Elevated left ventricular end-diastolic pressure.  2. Moderate septal hypertrophy with otherwise mild concentric LVH.  3. Right ventricular systolic function is normal. The right ventricular size is normal. There is normal pulmonary artery systolic pressure.  4. The mitral valve is normal in structure. No evidence of mitral valve regurgitation. No evidence of mitral stenosis.  5. The aortic valve is tricuspid. Aortic valve regurgitation is not visualized. No aortic stenosis is present.  6. The inferior vena cava is normal in size with greater than 50% respiratory variability, suggesting right atrial pressure of 3 mmHg. FINDINGS  Left Ventricle: Left ventricular ejection fraction, by estimation, is 65 to 70%. The left ventricle has normal function. The left ventricle has no regional wall motion abnormalities. The left ventricular internal cavity size was normal in size. There is  moderate asymmetric left ventricular hypertrophy of the septal segment. Left ventricular diastolic parameters are consistent with Grade I diastolic dysfunction (impaired relaxation). Elevated left ventricular end-diastolic pressure. Right Ventricle: The right ventricular size is normal. No increase in right ventricular wall thickness. Right ventricular systolic function is normal. There is normal pulmonary artery systolic pressure. The tricuspid regurgitant velocity is 1.35 m/s, and  with an assumed right atrial pressure of 3 mmHg, the estimated right ventricular systolic pressure is 123456 mmHg. Left Atrium: Left  atrial size was normal in size. Right Atrium: Right atrial size was normal in size. Pericardium: There is no evidence of pericardial effusion. Mitral Valve: The mitral valve is normal in structure. Normal mobility of the mitral valve leaflets. No evidence of mitral valve regurgitation. No evidence of mitral valve stenosis. Tricuspid Valve: The tricuspid valve is normal in structure. Tricuspid valve regurgitation is trivial. No evidence of tricuspid stenosis. Aortic Valve: The aortic valve is tricuspid. Aortic valve regurgitation is not visualized. No aortic stenosis is present. Pulmonic Valve: The pulmonic valve was normal in structure. Pulmonic valve regurgitation is not visualized. No evidence of pulmonic stenosis. Aorta: The aortic root is normal in size and structure. Venous: The inferior vena cava is normal in size with greater than 50% respiratory variability, suggesting right atrial pressure of 3 mmHg. IAS/Shunts: No atrial level shunt detected by color flow Doppler. Additional Comments: Very mild left ventricular intracavitary gradient noted. Peak velocity 1.0 m/s. Peak gradient 4 mmHg.  LEFT VENTRICLE PLAX 2D LVIDd:         3.40 cm  Diastology LVIDs:         2.10 cm  LV e' lateral:   7.05 cm/s LV PW:         1.10 cm  LV E/e' lateral: 9.2 LV IVS:        1.60 cm  LV e' medial:    4.22 cm/s LVOT diam:     1.70 cm  LV E/e' medial:  15.4 LV SV:         44 LV SV Index:   23 LVOT Area:     2.27 cm  RIGHT VENTRICLE RV S prime:     10.90 cm/s TAPSE (M-mode): 1.6 cm LEFT ATRIUM             Index LA diam:  3.30 cm 1.70 cm/m LA Vol (A2C):   43.9 ml 22.65 ml/m LA Vol (A4C):   49.7 ml 25.64 ml/m LA Biplane Vol: 47.2 ml 24.35 ml/m  AORTIC VALVE LVOT Vmax:   103.00 cm/s LVOT Vmean:  65.600 cm/s LVOT VTI:    0.194 m  AORTA Ao Root diam: 2.80 cm MITRAL VALVE               TRICUSPID VALVE MV Area (PHT): 2.66 cm    TR Peak grad:   7.3 mmHg MV Decel Time: 285 msec    TR Vmax:        135.00 cm/s MV E velocity: 64.80  cm/s MV A velocity: 93.60 cm/s  SHUNTS MV E/A ratio:  0.69        Systemic VTI:  0.19 m                            Systemic Diam: 1.70 cm Skeet Latch MD Electronically signed by Skeet Latch MD Signature Date/Time: 04/24/2019/11:40:54 AM    Final     Cardiac Studies   Echocardiogram 04/24/2019: Impressions: 1. Left ventricular ejection fraction, by estimation, is 65 to 70%. The  left ventricle has normal function. The left ventricle has no regional  wall motion abnormalities. There is moderate asymmetric left ventricular  hypertrophy of the septal segment.  Left ventricular diastolic parameters are consistent with Grade I  diastolic dysfunction (impaired relaxation). Elevated left ventricular  end-diastolic pressure.  2. Moderate septal hypertrophy with otherwise mild concentric LVH.  3. Right ventricular systolic function is normal. The right ventricular  size is normal. There is normal pulmonary artery systolic pressure.  4. The mitral valve is normal in structure. No evidence of mitral valve  regurgitation. No evidence of mitral stenosis.  5. The aortic valve is tricuspid. Aortic valve regurgitation is not  visualized. No aortic stenosis is present.  6. The inferior vena cava is normal in size with greater than 50%  respiratory variability, suggesting right atrial pressure of 3 mmHg.  _______________  Left Heart Catheterization 04/25/2019:  Ost Cx to Prox Cx lesion is 100% stenosed.  Prox RCA lesion is 30% stenosed.  Origin to Prox Graft lesion is 30% stenosed.  Origin lesion is 100% stenosed.  1st Diag lesion is 100% stenosed.  Ost LAD to Prox LAD lesion is 30% stenosed.   Multivessel CAD with ostial stenting of the LAD with 30% intimal hyperplasia and a patent LIMA graft which fills retrograde up to its insertion from the native LAD injection.  The distal LAD is free of significant disease.  The very proximal diagonal vessel is occluded; total occlusion of the  left circumflex ostial stent; dominant RCA with smooth 30% proximal stenosis.  Patent LIMA graft supplying the mid LAD.  Angiography into the left subclavian artery-year-old eccentric plaque shortly after takeoff from the aorta without significant obstruction.  The ostium of the LIMA graft was widely patent.  Patent vein graft supplying the circumflex marginal vessel with previously placed ostial stent with 30% intimal hyperplasia within the stented segment.  Occluded vein graft which had supplied the diagonal vessel.  LVEDP 16 mmHg.  Recommendation: Medical therapy.  Patient Profile   Ms. Sherratt is a 86 y.o. female with a history of CAD s/p CABG in 2002 and subsequent PCI, obstructive sleep apnea, and hypertension who was admitted with chest pain.  Assessment & Plan    Chest Pain with Elevated Troponin - EKG  with LVH and repolarization abnormalities.  - High-sensitivity troponin mildly elevated at 25 >> 82.  - Echo showed LVEF of 65-70% with normal wall motion, moderate LVH of the septal segment with otherwise mild concentric LVH, grade 1 diastolic CHF, and elevated LVEDP.  - Cath today showed stable CAD with occluded SVG to Diag but patent LIMA to LAD and SVG to CX. LVEDP 16 mmHg. Medical therapy recommended.  - Continue beta-blocker and statin. Will stop Aspirin given need to for Eliquis.  Paroxysmal Atrial Flutter with RVR - Patient was noted to have intermittent atrial flutter with RVR yesterday but looks like she has been maintaining sinus rhythm since around 10:15am yesterday after being started on Amiodarone. - Rate currently in the 50's to 60's.  - Continue Toprol-XL 200mg  daily.  - Continue Amiodarone 400mg  twice daily x1 week and then transition to 200mg  daily. - Patient will need to be started on Eliquis 5mg  twice daily at discharge. Will likely start tomorrow morning.  Hypertension - BP 156/57 this morning but has not received any of morning medications.  -  Continue Enalapril 5mg  daily and Toprol-XL 200mg  daily.  Hyperlipidemia - Continue home Lipitor.   For questions or updates, please contact [place] Please consult [email or web address] for contact info under        Signed, Darreld Mclean, PA-C  04/25/2019, 9:49 AM   As above, patient seen and examined.  Patient denies dyspnea or chest pain today.  Right groin shows no hematoma following cardiac catheterization.  Results of catheterization noted.  Plan is for medical therapy.  She remains in sinus rhythm today.  Amiodarone was initiated at 400 mg twice daily yesterday for incessant atrial fibrillation/flutter.  We will continue for 1 week then decrease to 200 mg daily thereafter.  Discontinue aspirin.  Begin apixaban 5 mg twice daily with first dose tomorrow morning.  Ambulate today after bedrest from cardiac catheterization.  If right groin stable will discharge later today.  Follow-up APP 1 to 2 weeks.  Follow-up Dr. Ellyn Hack in 3 months.  Greater than 30 minutes PA and physician time. [place]

## 2019-04-25 NOTE — Progress Notes (Signed)
Transported to the cath lab. Stable.

## 2019-04-25 NOTE — Progress Notes (Signed)
Back from the cath. Lab by bed awake and alert. Right groin with dressing dry and intact, with small bruising on the site. Continue to monitor.

## 2019-04-27 ENCOUNTER — Telehealth: Payer: Self-pay | Admitting: *Deleted

## 2019-04-27 NOTE — Telephone Encounter (Signed)
Transition Care Management Follow-up Telephone Call  Date of discharge and from where: 04/25/19 from [facility]  How have you been since you were released from the hospital? A lot better  Any questions or concerns? No   Items Reviewed:  Did the pt receive and understand the discharge instructions provided? Yes   Medications obtained and verified? Yes   Any new allergies since your discharge? No   Dietary orders reviewed? Yes  Do you have support at home? Yes  Daughter, Step Daughter and son  Other (ie: DME, Home Health, etc) no Home Health  Functional Questionnaire: (I = Independent and D = Dependent) ADL's: I with walker  Bathing/Dressing- I   Meal Prep- I  Eating- I  Maintaining continence- I  Transferring/Ambulation- I with assistance  Managing Meds- I   Follow up appointments reviewed:    PCP Hospital f/u appt confirmed? Yes  Scheduled to see Man[facility] on 05/04/19 @ 1:30.  [facility] f/u appt confirmed? No    Are transportation arrangements needed? No   If their condition worsens, is the pt aware to call  their PCP or go to the ED? Yes  Was the patient provided with contact information for the PCP's office or ED? Yes  Was the pt encouraged to call back with questions or concerns? Yes

## 2019-04-27 NOTE — Telephone Encounter (Signed)
I have made the 1st attempt to contact the patient or family member in charge, in order to follow up from recently being discharged from the hospital. I left a message on voicemail but I will make another attempt at a different time.  

## 2019-04-28 ENCOUNTER — Telehealth: Payer: Self-pay | Admitting: Cardiology

## 2019-04-28 NOTE — Telephone Encounter (Signed)
I attempted to contact patient to discuss- left number to call back to discuss visitor policy.

## 2019-04-28 NOTE — Telephone Encounter (Signed)
I spoke to the patient and because of mobility issues, the daughter will accompany to her appointment.

## 2019-04-28 NOTE — Telephone Encounter (Signed)
  Patient would like her daughter to come with her to her appointment since she uses a walker. Her appt is with Arnold Long on 05/03/19

## 2019-05-03 ENCOUNTER — Encounter: Payer: Self-pay | Admitting: Adult Health

## 2019-05-03 ENCOUNTER — Other Ambulatory Visit: Payer: Self-pay

## 2019-05-03 ENCOUNTER — Ambulatory Visit (INDEPENDENT_AMBULATORY_CARE_PROVIDER_SITE_OTHER): Payer: Medicare Other | Admitting: Adult Health

## 2019-05-03 VITALS — BP 122/68 | HR 57 | Wt 180.0 lb

## 2019-05-03 DIAGNOSIS — I10 Essential (primary) hypertension: Secondary | ICD-10-CM | POA: Diagnosis not present

## 2019-05-03 DIAGNOSIS — I249 Acute ischemic heart disease, unspecified: Secondary | ICD-10-CM

## 2019-05-03 DIAGNOSIS — I480 Paroxysmal atrial fibrillation: Secondary | ICD-10-CM | POA: Diagnosis not present

## 2019-05-03 DIAGNOSIS — I2510 Atherosclerotic heart disease of native coronary artery without angina pectoris: Secondary | ICD-10-CM

## 2019-05-03 DIAGNOSIS — Z79899 Other long term (current) drug therapy: Secondary | ICD-10-CM

## 2019-05-03 DIAGNOSIS — E785 Hyperlipidemia, unspecified: Secondary | ICD-10-CM

## 2019-05-03 MED ORDER — APIXABAN 5 MG PO TABS: 5.0000 mg | ORAL_TABLET | Freq: Two times a day (BID) | ORAL | 3 refills | Status: DC

## 2019-05-03 MED ORDER — AMIODARONE HCL 200 MG PO TABS: 200.0000 mg | ORAL_TABLET | Freq: Every day | ORAL | 3 refills | Status: DC

## 2019-05-03 MED ORDER — METOPROLOL SUCCINATE ER 200 MG PO TB24: 200.0000 mg | ORAL_TABLET | Freq: Every day | ORAL | 3 refills | Status: DC

## 2019-05-03 NOTE — Progress Notes (Signed)
Cardiology Office Note   Date:  05/03/2019   ID:  Stephanie Shea, DOB Jul 31, 1933, MRN [id number]  PCP:  Mast, [name], NP  Cardiologist: Dr. Ellyn Hack  CC: Hospital follow up    History of Present Illness: Stephanie Shea is a 86 y.o. female who presents for post hospital follow up after admission for chest pain. She has a history of CAD s/p CABG in 2002 and subsequent PCI, OSA on CPAP, HTN, and HL.   Echo showed LVEF of 65-70% with normal wall moderate LVH of the septal segment with otherwise mild concentric LVH, grade 1 diastolic CHF, and elevated LVEDP. Very mild LV intracavitary gradient noted (peak gradient 4 mmHg). Patient underwent left heart catheterization on 04/25/2019 which showed stable CAD with occluded SVG to Diag but patent LIMA to LAD and SVG to CX. LVEDP 16 mmHg.   She did have episode of atrial fibrillation/flutter with rates as high as 140-150's. TSH was normal. She was started on amiodarone 400 mg BID and converted to NSR. She was continued to 400 mg BID for one week and transition to  200 mg daily.Marland Kitchen She was to continue Toprol XL 200 mg daily, and Eliquis 5 mg BID. She was continued enalapril 5 mg daily,  She was discharged on 4/13/202.   She comes today without cardiac complaints.She is suffering from some hip and left leg pain when she sits too long or walks. She has appointment with orthopedics in a couple of days. She denies any bleeding, epitaxis, hemoptysis, or excessive bruising   Past Medical History:  Diagnosis Date  . Atherosclerosis of native coronary artery 2001   LAD & Cx disease -- BMS PCI in 2001; CABG 2002.  PCI in 2006 (per-op TAH)  . Breast cancer ([place])   . Cervix dysplasia   . GERD (gastroesophageal reflux disease)   . Heart attack ([place]) 2002, 2006   Both events reportedly were perioperatively. She has no recollection. Initial PCI was related to angina symptoms (bilateral arm pain)  . High cholesterol   . Hypertension   . Lumbosacral spinal stenosis   .  Osteoarthritis   . Skin cancer   . Sleep apnea    Uses CPAP faithfully    Past Surgical History:  Procedure Laterality Date  . ABDOMINAL HYSTERECTOMY    . bone density    . CARDIAC CATHETERIZATION  2001    Dr. Glade Lloyd [facility]) 2 vessel CAD involving LAD-D1 and distal Cx-OM. Appearance of dilated tapered left main with no significant atherosclerosis on IVUS --  BMS PCI of pLAD ([facility] BMS 3.0 mm x 15 mm) & with PTCA of ostial D1, PTCA of dLAD.  BMS PCI pCx [facility] BMS 3.5 mm x 11 mm) - unable to reopen dCx-OM (probable distal Atheroembolic occlusion.  Normal LVEF.  Marland Kitchen COLONOSCOPY    . CORONARY ARTERY BYPASS GRAFT  2002   Unsure of details [facility])  . CORONARY STENT INTERVENTION  2001   ([place]) [facility] BMS PCI pLAD (3.0 mm x 15 mm) -PTCA of jailed D1 as well as distal LAD; pCx ([facility] BMS 3.5 mm x 11 mm) -unable to open distalCx-OM -thromboembolic occlusion  . DIAGNOSTIC MAMMOGRAM    . LEFT HEART CATH AND CORS/GRAFTS ANGIOGRAPHY N/A 04/25/2019   Procedure: LEFT HEART CATH AND CORS/GRAFTS ANGIOGRAPHY;  Surgeon: Troy Sine, MD;  Location: [place] [facility];  Service: Cardiovascular;  Laterality: N/A;  . left knee replacement    . MASTECTOMY    .  NM MYOVIEW LTD  12/2014; 2018   Cardiologist (Dr. Kellie Simmering.  [place], [place] ph# [phone]) -->LEXISCAN: Nonischemic, "normal "  . pap smear    . tonsilectomy    . TRANSTHORACIC ECHOCARDIOGRAM  12/2014   Echocardiogram 12/25/14- normal LVEF and systolic function, EF 99991111, mild grade 1 diastolic dysfunction     Current Outpatient Medications  Medication Sig Dispense Refill  . acetaminophen (TYLENOL) 500 MG tablet Take 500 mg by mouth at bedtime as needed (for mild pain and "sleep").     Marland Kitchen amiodarone (PACERONE) 200 MG tablet Take 1 tablet (200 mg total) by mouth daily. 180 tablet 3  . ammonium lactate (AMLACTIN) 12 % cream Apply topically as needed for dry skin.    Marland Kitchen apixaban (ELIQUIS) 5 MG TABS tablet Take 1  tablet (5 mg total) by mouth 2 (two) times daily. 180 tablet 3  . atorvastatin (LIPITOR) 80 MG tablet TAKE 1 TABLET DAILY (Patient taking differently: Take 80 mg by mouth daily after supper. ) 90 tablet 1  . Cholecalciferol (VITAMIN D3) 50 MCG (2000 UT) TABS Take 2,000 Units by mouth daily after breakfast.    . enalapril (VASOTEC) 5 MG tablet TAKE 1 TABLET DAILY (Patient taking differently: Take 5 mg by mouth daily. ) 90 tablet 1  . fexofenadine (ALLEGRA) 180 MG tablet Take 180 mg by mouth daily as needed for allergies or rhinitis.     . fluticasone (FLONASE) 50 MCG/ACT nasal spray Place 1 spray into both nostrils daily as needed for allergies or rhinitis.     . metoprolol (TOPROL-XL) 200 MG 24 hr tablet Take 1 tablet (200 mg total) by mouth daily. 90 tablet 3  . nitroGLYCERIN (NITROSTAT) 0.4 MG SL tablet PLACE 1 TABLET UNDER TONGUE EVERY 5 MINUTES FOR 3 DOSES FOR CHEST PAIN (Patient taking differently: Place 0.4 mg under the tongue every 5 (five) minutes x 3 doses as needed for chest pain. ) 25 tablet 1  . omeprazole (PRILOSEC OTC) 20 MG tablet Take 20 mg by mouth daily before breakfast.    . Wheat Dextrin (BENEFIBER) POWD 2 spoonful mixed in water 3 times a day (Patient taking differently: Take by mouth See admin instructions. Mix 2 teaspoonsful into 6-8 ounces of water or other beverage and drink twice a day)  0   No current facility-administered medications for this visit.    Allergies:   Codeine    Social History:  The patient  reports that she has never smoked. She has never used smokeless tobacco. She reports that she does not drink alcohol or use drugs.   Family History:  The patient's family history includes Heart disease in her father; Stroke in her mother.    ROS: All other systems are reviewed and negative. Unless otherwise mentioned in H&P    PHYSICAL EXAM: VS:  BP 122/68   Pulse (!) 57   Wt 180 lb (81.6 kg)   SpO2 96%   BMI 28.19 kg/m  , BMI Body mass index is 28.19  kg/m. GEN: Well nourished, well developed, in no acute distress HEENT: normal Neck: no JVD, carotid bruits, or masses Cardiac: RRR; 2/6 systolic murmur heard best at the LSB no radiation, norubs, or gallops,no edema  Respiratory:  Clear to auscultation bilaterally, normal work of breathing GI: soft, nontender, nondistended, + BS MS: no deformity or atrophy, varicosities noted bilaterally.  Skin: warm and dry, no rash Neuro:  Strength and sensation are intact Psych: euthymic mood, full affect   EKG:  Sinus  bradycardia with 1st degree AV block, possible left atrial enlargement. S & T wave abnormalities in the lateral leads. Noted ST down in V2 and lead I. Rate of 57 bpm.  Recent Labs:a 04/23/2019: ALT 18 04/25/2019: BUN 13; Creatinine, Ser 0.87; Hemoglobin 12.2; Platelets 200; Potassium 4.3; Sodium 140; TSH 2.890    Lipid Panel    Component Value Date/Time   CHOL 163 12/21/2018 1438   CHOL 193 03/17/2017 0936   TRIG 166 (H) 12/21/2018 1438   HDL 42 (L) 12/21/2018 1438   HDL 56 03/17/2017 0936   CHOLHDL 3.9 12/21/2018 1438   LDLCALC 95 12/21/2018 1438      Wt Readings from Last 3 Encounters:  05/03/19 180 lb (81.6 kg)  04/25/19 179 lb 14.3 oz (81.6 kg)  03/28/19 181 lb 9.6 oz (82.4 kg)      Other studies Reviewed: Echocardiogram May 10, 2019: Impressions: 1. Left ventricular ejection fraction, by estimation, is 65 to 70%. The  left ventricle has normal function. The left ventricle has no regional  wall motion abnormalities. There is moderate asymmetric left ventricular  hypertrophy of the septal segment.  Left ventricular diastolic parameters are consistent with Grade I  diastolic dysfunction (impaired relaxation). Elevated left ventricular  end-diastolic pressure.  2. Moderate septal hypertrophy with otherwise mild concentric LVH.  3. Right ventricular systolic function is normal. The right ventricular  size is normal. There is normal pulmonary artery systolic  pressure.  4. The mitral valve is normal in structure. No evidence of mitral valve  regurgitation. No evidence of mitral stenosis.  5. The aortic valve is tricuspid. Aortic valve regurgitation is not  visualized. No aortic stenosis is present.  6. The inferior vena cava is normal in size with greater than 50%  respiratory variability, suggesting right atrial pressure of 3 mmHg.  _______________  Left Heart Catheterization 04/25/2019:  Ost Cx to Prox Cx lesion is 100% stenosed.  Prox RCA lesion is 30% stenosed.  Origin to Prox Graft lesion is 30% stenosed.  Origin lesion is 100% stenosed.  1st Diag lesion is 100% stenosed.  Ost LAD to Prox LAD lesion is 30% stenosed.  Multivessel CAD with ostial stenting of the LAD with 30% intimal hyperplasia and a patent LIMA graft which fills retrograde up to its insertion from the native LAD injection. The distal LAD is free of significant disease. The very proximal diagonal vessel is occluded; total occlusion of the left circumflex ostial stent; dominant RCA with smooth 30% proximal stenosis.  Patent LIMA graft supplying the mid LAD. Angiography into the left subclavian artery-year-old eccentric plaque shortly after takeoff from the aorta without significant obstruction. The ostium of the LIMA graft was widely patent.  Patent vein graft supplying the circumflex marginal vessel with previously placed ostial stent with 30% intimal hyperplasia within the stented segment.  Occluded vein graft which had supplied the diagonal vessel.  LVEDP 16 mmHg.  Recommendation: Medical therapy.  Diagnostic Dominance: Right      History of Present Illness        ASSESSMENT AND PLAN:  1. Paroxysmal Atrial fib: Now in NSR, Sinus bradycardia on amiodarone and metoprolol. She is to take 200 mg daily now. She is already taking this dose now. She will continue Eliquis 5 mg BID.  CHADS VASC Score of 5. Checking TSH, CMET and CBC in 3 months  prior to next appointment.   2. CAD:Hx of CABG Denies anginal symptoms. Continue the statin therapy, ACE. Not on ASA as she is on DOAC  3. Hyperlipidemia: Goal of LDL < 70.Last documented LDL 03/17/2017 93.  She is on Lipitor 80 mg daily. May need to drawn fasting lipids on next visit if not done by PCP.     Current medicines are reviewed at length with the patient today.  I have spent 25 mins dedicated to the care of this patient on the date of this encounter to include pre-visit review of records, assessment, management and diagnostic testing,with shared decision making.  Labs/ tests ordered today include: CMET, TSH, CBC.   Stephanie Shea, ANP, AACC   05/03/2019 3:44 PM    [facility] [place] [address] Office [phone] Fax [phone]  Notice: This dictation was prepared with Dragon dictation along with smaller phrase technology. Any transcriptional errors that result from this process are unintentional and may not be corrected upon review.

## 2019-05-03 NOTE — Patient Instructions (Addendum)
Medication Instructions:  Continue current medications  *If you need a refill on your cardiac medications before your next appointment, please call your pharmacy*   Lab Work: CMP, CBC and TSH in 3 Months  If you have labs (blood work) drawn today and your tests are completely normal, you will receive your results only by: Marland Kitchen MyChart Message (if you have MyChart) OR . A paper copy in the mail If you have any lab test that is abnormal or we need to change your treatment, we will call you to review the results.   Testing/Procedures: None Ordered   Follow-Up: At [facility], you and your health needs are our priority.  As part of our continuing mission to provide you with exceptional heart care, we have created designated Provider Care Teams.  These Care Teams include your primary Cardiologist (physician) and Advanced Practice Providers (APPs -  Physician Assistants and Nurse Practitioners) who all work together to provide you with the care you need, when you need it.  We recommend signing up for the patient portal called "MyChart".  Sign up information is provided on this After Visit Summary.  MyChart is used to connect with patients for Virtual Visits (Telemedicine).  Patients are able to view lab/test results, encounter notes, upcoming appointments, etc.  Non-urgent messages can be sent to your provider as well.   To learn more about what you can do with MyChart, go to [email or web address].    Your next appointment:   3 month(s)  The format for your next appointment:   In Person  Provider:   You may see Glenetta Hew, MD or one of the following Advanced Practice Providers on your designated Care Team:    Rosaria Ferries, PA-C  Jory Sims, DNP, ANP  Cadence Kathlen Mody, NP

## 2019-05-04 ENCOUNTER — Ambulatory Visit: Payer: Medicare Other | Admitting: Nurse Practitioner

## 2019-05-04 ENCOUNTER — Encounter: Payer: Self-pay | Admitting: Nurse Practitioner

## 2019-05-04 DIAGNOSIS — M159 Polyosteoarthritis, unspecified: Secondary | ICD-10-CM

## 2019-05-04 DIAGNOSIS — I5032 Chronic diastolic (congestive) heart failure: Secondary | ICD-10-CM | POA: Diagnosis not present

## 2019-05-04 DIAGNOSIS — I25709 Atherosclerosis of coronary artery bypass graft(s), unspecified, with unspecified angina pectoris: Secondary | ICD-10-CM | POA: Diagnosis not present

## 2019-05-04 DIAGNOSIS — I483 Typical atrial flutter: Secondary | ICD-10-CM

## 2019-05-04 DIAGNOSIS — K219 Gastro-esophageal reflux disease without esophagitis: Secondary | ICD-10-CM

## 2019-05-04 DIAGNOSIS — E785 Hyperlipidemia, unspecified: Secondary | ICD-10-CM

## 2019-05-04 DIAGNOSIS — I10 Essential (primary) hypertension: Secondary | ICD-10-CM | POA: Diagnosis not present

## 2019-05-04 DIAGNOSIS — E663 Overweight: Secondary | ICD-10-CM

## 2019-05-04 DIAGNOSIS — I5030 Unspecified diastolic (congestive) heart failure: Secondary | ICD-10-CM | POA: Insufficient documentation

## 2019-05-04 NOTE — Assessment & Plan Note (Signed)
Continue Atorvastatin

## 2019-05-04 NOTE — Assessment & Plan Note (Signed)
Echocardiogram 12/25/14- normal LVEF and systolic function, EF 99991111, mild grade 1 diastolic dysfunction Lexiscan myoview stress test 12/31/14 normal 4/12 Echo EF [id number], grade 1 diastolic dysfunction

## 2019-05-04 NOTE — Progress Notes (Signed)
Location:   clinic [facility]   Place of Service:   clinic [place] Provider: Marlana Latus NP  Code Status: DNR Goals of Care: [place] 04/25/2019  Does Patient Have a Medical Advance Directive? Yes  Type of Advance Directive [place]  Does patient want to make changes to medical advance directive? No - Patient declined  Copy of [place] in Chart? -  Pre-existing out of facility DNR order (yellow form or pink MOST form) -     Chief Complaint  Patient presents with  . Transitions Of Care    HPI: Patient is a 86 y.o. female seen today for medical management of chronic diseases.    Hospitalized 4/11-4/13 for chest pain, no relieve after NTG. Cardiac Cath 04/25/19 with s/p stents, bypass, recommended medical therapy. Hx of CAD coronary bypass graft of native heart with angina pectoris. Hx of Afib, stopped ASA, started Eliquis 2.5mg , Amiodarone, continued Metoprolol  04/26/19. HTN on Metoprolol, Enalapril.  hyperlipidemia. 4/12 Echo EF Q000111Q, grade 1 diastolic dysfunction  Hx of GERD, stable, on Omeprazole 20mg  qd. Hyperlipidemia, on Atorvastatin 80mg  qd.      Past Medical History:  Diagnosis Date  . Atherosclerosis of native coronary artery 2001   LAD & Cx disease -- BMS PCI in 2001; CABG 2002.  PCI in 2006 (per-op TAH)  . Breast cancer ([place])   . Cervix dysplasia   . GERD (gastroesophageal reflux disease)   . Heart attack ([place]) 2002, 2006   Both events reportedly were perioperatively. She has no recollection. Initial PCI was related to angina symptoms (bilateral arm pain)  . High cholesterol   . Hypertension   . Lumbosacral spinal stenosis   . Osteoarthritis   . Skin cancer   . Sleep apnea    Uses CPAP faithfully    Past Surgical History:  Procedure Laterality Date  . ABDOMINAL HYSTERECTOMY    . bone density    . CARDIAC CATHETERIZATION  2001    Dr. Glade [facility]) 2 vessel CAD involving LAD-D1 and distal Cx-OM. Appearance  of dilated tapered left main with no significant atherosclerosis on IVUS --  BMS PCI of pLAD ([facility] BMS 3.0 mm x 15 mm) & with PTCA of ostial D1, PTCA of dLAD.  BMS PCI pCx [facility] BMS 3.5 mm x 11 mm) - unable to reopen dCx-OM (probable distal Atheroembolic occlusion.  Normal LVEF.  Marland Kitchen COLONOSCOPY    . CORONARY ARTERY BYPASS GRAFT  2002   Unsure of details [facility], in [facility])  . CORONARY STENT INTERVENTION  2001   ([place]) [place] BMS PCI pLAD (3.0 mm x 15 mm) -PTCA of jailed D1 as well as distal LAD; pCx ([place] BMS 3.5 mm x 11 mm) -unable to open distalCx-OM -thromboembolic occlusion  . DIAGNOSTIC MAMMOGRAM    . LEFT HEART CATH AND CORS/GRAFTS ANGIOGRAPHY N/A 04/25/2019   Procedure: LEFT HEART CATH AND CORS/GRAFTS ANGIOGRAPHY;  Surgeon: Troy Sine, MD;  Location: [facility];  Service: Cardiovascular;  Laterality: N/A;  . left knee replacement    . MASTECTOMY    . NM MYOVIEW LTD  12/2014; 2018   Cardiologist (Dr. Kellie Simmering.  [place], [place] ph# [phone]) -->LEXISCAN: Nonischemic, "normal "  . pap smear    . tonsilectomy    . TRANSTHORACIC ECHOCARDIOGRAM  12/2014   Echocardiogram 12/25/14- normal LVEF and systolic function, EF 99991111, mild grade 1 diastolic dysfunction    Allergies  Allergen Reactions  . Codeine  Nausea Only    Allergies as of 05/04/2019      Reactions   Codeine Nausea Only      Medication List       Accurate as of May 04, 2019 11:59 PM. If you have any questions, ask your nurse or doctor.        acetaminophen 500 MG tablet Commonly known as: TYLENOL Take 500 mg by mouth at bedtime as needed (for mild pain and "sleep").   amiodarone 200 MG tablet Commonly known as: PACERONE Take 1 tablet (200 mg total) by mouth daily.   ammonium lactate 12 % cream Commonly known as: AMLACTIN Apply topically as needed for dry skin.   apixaban 5 MG Tabs tablet Commonly known as: Eliquis Take 1 tablet (5 mg total) by mouth 2 (two) times daily.     atorvastatin 80 MG tablet Commonly known as: LIPITOR TAKE 1 TABLET DAILY What changed: when to take this   Benefiber Powd 2 spoonful mixed in water 3 times a day What changed:   how to take this  when to take this  additional instructions   enalapril 5 MG tablet Commonly known as: VASOTEC TAKE 1 TABLET DAILY   fexofenadine 180 MG tablet Commonly known as: ALLEGRA Take 180 mg by mouth daily as needed for allergies or rhinitis.   fluticasone 50 MCG/ACT nasal spray Commonly known as: FLONASE Place 1 spray into both nostrils daily as needed for allergies or rhinitis.   metoprolol 200 MG 24 hr tablet Commonly known as: TOPROL-XL Take 1 tablet (200 mg total) by mouth daily.   nitroGLYCERIN 0.4 MG SL tablet Commonly known as: NITROSTAT PLACE 1 TABLET UNDER TONGUE EVERY 5 MINUTES FOR 3 DOSES FOR CHEST PAIN What changed: See the new instructions.   omeprazole 20 MG tablet Commonly known as: PRILOSEC OTC Take 20 mg by mouth daily before breakfast.   Vitamin D3 50 MCG (2000 UT) Tabs Take 2,000 Units by mouth daily after breakfast.       Review of Systems:  Review of Systems  Constitutional: Negative for activity change, appetite change, fatigue and fever.  HENT: Negative for congestion, hearing loss and voice change.   Respiratory: Negative for cough, shortness of breath and wheezing.   Cardiovascular: Positive for leg swelling. Negative for chest pain and palpitations.  Gastrointestinal: Negative for abdominal distention, abdominal pain and constipation.  Genitourinary: Negative for difficulty urinating, dysuria and urgency.  Musculoskeletal: Positive for arthralgias and gait problem.       S/p L TKR, pain is resolved right knee OA s/p inj, c/o left hip pian f/u Ortho 05/05/19  Skin:       Right groin bruise from Cardiac cath insertion  Neurological: Negative for dizziness, speech difficulty and headaches.       Memory lapses.   Hematological: Bruises/bleeds easily.   Psychiatric/Behavioral: Negative for agitation, behavioral problems and sleep disturbance.    Health Maintenance  Topic Date Due  . FOOT EXAM  Never done  . OPHTHALMOLOGY EXAM  Never done  . TETANUS/TDAP  Never done  . PNA vac Low Risk Adult (2 of 2 - PCV13) 01/12/2017  . HEMOGLOBIN A1C  06/21/2019  . INFLUENZA VACCINE  08/13/2019  . DEXA SCAN  Completed  . COVID-19 Vaccine  Completed    Physical Exam: Vitals:   05/04/19 1341  BP: 126/72  Pulse: (!) 52  Temp: (!) 97.5 F (36.4 C)  SpO2: 97%  Weight: 189 lb 6.4 oz (85.9 kg)  Height: 5'  7" (1.702 m)   Body mass index is 29.66 kg/m. Physical Exam Constitutional:      General: She is not in acute distress.    Appearance: Normal appearance. She is not ill-appearing or diaphoretic.  HENT:     Head: Normocephalic and atraumatic.     Nose: Nose normal.     Mouth/Throat:     Mouth: Mucous membranes are dry.  Eyes:     Extraocular Movements: Extraocular movements intact.     Pupils: Pupils are equal, round, and reactive to light.  Cardiovascular:     Rate and Rhythm: Normal rate and regular rhythm.     Heart sounds: Murmur present.  Pulmonary:     Effort: Pulmonary effort is normal.     Breath sounds: Normal breath sounds. No wheezing, rhonchi or rales.  Abdominal:     General: There is no distension.     Palpations: Abdomen is soft.     Tenderness: There is no abdominal tenderness. There is no guarding or rebound.  Musculoskeletal:     Cervical back: Normal range of motion and neck supple.     Right lower leg: Edema present.     Left lower leg: Edema present.     Comments: Trace edema BLE  Skin:    General: Skin is warm and dry.     Findings: Bruising present.     Comments: Right groin area.   Neurological:     General: No focal deficit present.     Mental Status: She is alert and oriented to person, place, and time.     Motor: No weakness.     Coordination: Coordination normal.     Gait: Gait abnormal.      Deep Tendon Reflexes: Reflexes normal.  Psychiatric:        Mood and Affect: Mood normal.        Behavior: Behavior normal.        Thought Content: Thought content normal.        Judgment: Judgment normal.     Labs reviewed: Basic Metabolic Panel: Recent Labs    08/16/18 0730 12/21/18 1438 04/23/19 2003 04/24/19 0508 04/25/19 0616 04/25/19 1126  NA 142   < > 136 142 140  --   K 4.3   < > 3.7 4.0 4.3  --   CL 105   < > 102 105 105  --   CO2 29   < > 24 25 26   --   GLUCOSE 102*   < > 138* 114* 114*  --   BUN 15   < > 14 14 13   --   CREATININE 0.82   < > 0.87 0.87 0.87  --   CALCIUM 10.1   < > 9.1 9.4 9.4  --   TSH 2.35  --   --   --   --  2.890   < > = values in this interval not displayed.   Liver Function Tests: Recent Labs    08/16/18 0730 12/21/18 1438 04/23/19 2003  AST 17 20 22   ALT 18 20 18   ALKPHOS  --   --  46  BILITOT 0.9 0.8 1.0  PROT 7.2 6.8 6.7  ALBUMIN  --   --  3.6   No results for input(s): LIPASE, AMYLASE in the last 8760 hours. No results for input(s): AMMONIA in the last 8760 hours. CBC: Recent Labs    08/16/18 0730 08/16/18 0730 12/21/18 1438 12/21/18 1438 04/23/19 2003 04/24/19 0508 04/25/19  0616  WBC 6.7   < > 5.1   < > 8.2 7.1 7.9  NEUTROABS 3,558  --  3,045  --   --   --   --   HGB 13.0   < > 12.6   < > 13.1 12.3 12.2  HCT 39.3   < > 37.2   < > 41.1 37.8 37.9  MCV 96.1   < > 94.4   < > 100.7* 97.7 99.7  PLT 229   < > 164   < > 213 198 200   < > = values in this interval not displayed.   Lipid Panel: Recent Labs    08/16/18 0730 12/21/18 1438  CHOL 188 163  HDL 53 42*  LDLCALC 99 95  TRIG 240* 166*  CHOLHDL 3.5 3.9   Lab Results  Component Value Date   HGBA1C 6.3 (H) 12/21/2018    Procedures since last visit: CARDIAC CATHETERIZATION  Result Date: 04/25/2019  Ost Cx to Prox Cx lesion is 100% stenosed.  Prox RCA lesion is 30% stenosed.  Origin to Prox Graft lesion is 30% stenosed.  Origin lesion is 100% stenosed.   1st Diag lesion is 100% stenosed.  Ost LAD to Prox LAD lesion is 30% stenosed.  Multivessel CAD with ostial stenting of the LAD with 30% intimal hyperplasia and a patent LIMA graft which fills retrograde up to its insertion from the native LAD injection.  The distal LAD is free of significant disease.  The very proximal diagonal vessel is occluded; total occlusion of the left circumflex ostial stent; dominant RCA with smooth 30% proximal stenosis. Patent LIMA graft supplying the mid LAD.  Angiography into the left subclavian artery-year-old eccentric plaque shortly after takeoff from the aorta without significant obstruction.  The ostium of the LIMA graft was widely patent. Patent vein graft supplying the circumflex marginal vessel with previously placed ostial stent with 30% intimal hyperplasia within the stented segment. Occluded vein graft which had supplied the diagonal vessel. LVEDP 16 mmHg. RECOMMENDATION: Medical therapy.  DG Chest Port 1 View  Result Date: 04/23/2019 CLINICAL DATA:  Neck and chest tightness. EXAM: PORTABLE CHEST 1 VIEW COMPARISON:  None. FINDINGS: Multiple sternal wires vascular clips are seen. Mild, chronic appearing increased lung markings are seen without evidence of acute infiltrate, pleural effusion or pneumothorax. The heart size and mediastinal contours are within normal limits. There is moderate severity calcification aortic arch. Degenerative changes are seen throughout the thoracic spine. IMPRESSION: 1. Evidence of prior median sternotomy/CABG. 2. No acute or active cardiopulmonary disease. Electronically Signed   By: Virgina Norfolk M.D.   On: 04/23/2019 20:20   ECHOCARDIOGRAM COMPLETE  Result Date: 04/24/2019    ECHOCARDIOGRAM REPORT   Patient Name:   Stephanie Shea Date of Exam: 04/24/2019 Medical Rec #:  [id number]     Height:       67.0 in Accession #:    [id number]    Weight:       181.0 lb Date of Birth:  [birth year]    BSA:          1.938 m Patient Age:    16  years      BP:           109/67 mmHg Patient Gender: F             HR:           139 bpm. Exam Location:  Inpatient Procedure: 2D Echo Indications:    Acute Cornonary  Syndrome I24.9  History:        Patient has prior history of Echocardiogram examinations. CAD                 and non-ST elevation myocardial infarction, Prior CABG,                 Arrythmias:Atrial Flutter; Risk Factors:Hypertension and                 Dyslipidemia. GERD.  Sonographer:    Darlina Sicilian RDCS Referring Phys: [id number] [place]  1. Left ventricular ejection fraction, by estimation, is 65 to 70%. The left ventricle has normal function. The left ventricle has no regional wall motion abnormalities. There is moderate asymmetric left ventricular hypertrophy of the septal segment. Left ventricular diastolic parameters are consistent with Grade I diastolic dysfunction (impaired relaxation). Elevated left ventricular end-diastolic pressure.  2. Moderate septal hypertrophy with otherwise mild concentric LVH.  3. Right ventricular systolic function is normal. The right ventricular size is normal. There is normal pulmonary artery systolic pressure.  4. The mitral valve is normal in structure. No evidence of mitral valve regurgitation. No evidence of mitral stenosis.  5. The aortic valve is tricuspid. Aortic valve regurgitation is not visualized. No aortic stenosis is present.  6. The inferior vena cava is normal in size with greater than 50% respiratory variability, suggesting right atrial pressure of 3 mmHg. FINDINGS  Left Ventricle: Left ventricular ejection fraction, by estimation, is 65 to 70%. The left ventricle has normal function. The left ventricle has no regional wall motion abnormalities. The left ventricular internal cavity size was normal in size. There is  moderate asymmetric left ventricular hypertrophy of the septal segment. Left ventricular diastolic parameters are consistent with Grade I diastolic  dysfunction (impaired relaxation). Elevated left ventricular end-diastolic pressure. Right Ventricle: The right ventricular size is normal. No increase in right ventricular wall thickness. Right ventricular systolic function is normal. There is normal pulmonary artery systolic pressure. The tricuspid regurgitant velocity is 1.35 m/s, and  with an assumed right atrial pressure of 3 mmHg, the estimated right ventricular systolic pressure is 123456 mmHg. Left Atrium: Left atrial size was normal in size. Right Atrium: Right atrial size was normal in size. Pericardium: There is no evidence of pericardial effusion. Mitral Valve: The mitral valve is normal in structure. Normal mobility of the mitral valve leaflets. No evidence of mitral valve regurgitation. No evidence of mitral valve stenosis. Tricuspid Valve: The tricuspid valve is normal in structure. Tricuspid valve regurgitation is trivial. No evidence of tricuspid stenosis. Aortic Valve: The aortic valve is tricuspid. Aortic valve regurgitation is not visualized. No aortic stenosis is present. Pulmonic Valve: The pulmonic valve was normal in structure. Pulmonic valve regurgitation is not visualized. No evidence of pulmonic stenosis. Aorta: The aortic root is normal in size and structure. Venous: The inferior vena cava is normal in size with greater than 50% respiratory variability, suggesting right atrial pressure of 3 mmHg. IAS/Shunts: No atrial level shunt detected by color flow Doppler. Additional Comments: Very mild left ventricular intracavitary gradient noted. Peak velocity 1.0 m/s. Peak gradient 4 mmHg.  LEFT VENTRICLE PLAX 2D LVIDd:         3.40 cm  Diastology LVIDs:         2.10 cm  LV e' lateral:   7.05 cm/s LV PW:         1.10 cm  LV E/e' lateral: 9.2 LV IVS:  1.60 cm  LV e' medial:    4.22 cm/s LVOT diam:     1.70 cm  LV E/e' medial:  15.4 LV SV:         44 LV SV Index:   23 LVOT Area:     2.27 cm  RIGHT VENTRICLE RV S prime:     10.90 cm/s TAPSE  (M-mode): 1.6 cm LEFT ATRIUM             Index LA diam:        3.30 cm 1.70 cm/m LA Vol (A2C):   43.9 ml 22.65 ml/m LA Vol (A4C):   49.7 ml 25.64 ml/m LA Biplane Vol: 47.2 ml 24.35 ml/m  AORTIC VALVE LVOT Vmax:   103.00 cm/s LVOT Vmean:  65.600 cm/s LVOT VTI:    0.194 m  AORTA Ao Root diam: 2.80 cm MITRAL VALVE               TRICUSPID VALVE MV Area (PHT): 2.66 cm    TR Peak grad:   7.3 mmHg MV Decel Time: 285 msec    TR Vmax:        135.00 cm/s MV E velocity: 64.80 cm/s MV A velocity: 93.60 cm/s  SHUNTS MV E/A ratio:  0.69        Systemic VTI:  0.19 m                            Systemic Diam: 1.70 cm Skeet Latch MD Electronically signed by Skeet Latch MD Signature Date/Time: 04/24/2019/11:40:54 AM    Final     Assessment/Plan  Diastolic CHF ([place]) Echocardiogram 12/25/14- normal LVEF and systolic function, EF 99991111, mild grade 1 diastolic dysfunction Lexiscan myoview stress test 12/31/14 normal 4/12 Echo EF Q000111Q, grade 1 diastolic dysfunction    Essential hypertension Blood pressure is controlled, continue Metoprolol, Enalapril.   Atrial flutter/fibrillation Heart rate is controlled, continue Amiodarone, Metoprolol, Eliquis. F/u Cardiology.   Gastroesophageal reflux disease Stable, continue Omeprazoel 20mg  qd.   Hyperlipidemia Continue Atorvastatin.   Overweight (BMI 25.0-29.9) Will start with reduce desert to one half size, observe.   Osteoarthritis of multiple joints S/p left knee replacement, hx of right pain, better now, last inj about a year ago, recent left hip pain, see Ortho tomorrow.    Labs/tests ordered:  None  Next appt:  06/08/19 labs 06/01/19

## 2019-05-04 NOTE — Assessment & Plan Note (Signed)
Stable, continue Omeprazoel 20mg  qd.

## 2019-05-04 NOTE — Assessment & Plan Note (Addendum)
Heart rate is controlled, continue Amiodarone, Metoprolol, Eliquis. F/u Cardiology.

## 2019-05-04 NOTE — Assessment & Plan Note (Signed)
Will start with reduce desert to one half size, observe.

## 2019-05-04 NOTE — Patient Instructions (Addendum)
Weight reduction by cutting dessert into one half of serving.

## 2019-05-04 NOTE — Assessment & Plan Note (Signed)
S/p left knee replacement, hx of right pain, better now, last inj about a year ago, recent left hip pain, see Ortho tomorrow.

## 2019-05-04 NOTE — Assessment & Plan Note (Signed)
Blood pressure is controlled, continue Metoprolol, Enalapril.

## 2019-05-05 ENCOUNTER — Encounter: Payer: Self-pay | Admitting: Nurse Practitioner

## 2019-05-16 NOTE — Addendum Note (Signed)
Addended by: Wonda Horner on: 05/16/2019 02:58 PM   Modules accepted: Orders

## 2019-05-25 ENCOUNTER — Telehealth: Payer: Self-pay | Admitting: Cardiology

## 2019-05-25 ENCOUNTER — Other Ambulatory Visit: Payer: Self-pay

## 2019-05-25 ENCOUNTER — Non-Acute Institutional Stay: Payer: Medicare Other | Admitting: Nurse Practitioner

## 2019-05-25 ENCOUNTER — Encounter: Payer: Self-pay | Admitting: Nurse Practitioner

## 2019-05-25 DIAGNOSIS — I10 Essential (primary) hypertension: Secondary | ICD-10-CM | POA: Diagnosis not present

## 2019-05-25 DIAGNOSIS — I25709 Atherosclerosis of coronary artery bypass graft(s), unspecified, with unspecified angina pectoris: Secondary | ICD-10-CM | POA: Diagnosis not present

## 2019-05-25 DIAGNOSIS — I483 Typical atrial flutter: Secondary | ICD-10-CM | POA: Diagnosis not present

## 2019-05-25 DIAGNOSIS — I249 Acute ischemic heart disease, unspecified: Secondary | ICD-10-CM

## 2019-05-25 NOTE — Telephone Encounter (Signed)
STAT if HR is under 50 or over 120 (normal HR is 60-100 beats per minute)  1) What is your heart rate? 49  2) Do you have a log of your heart rate readings (document readings)? No   3) Do you have any other symptoms? No

## 2019-05-25 NOTE — Patient Instructions (Addendum)
Needs update TSH, CBC/diff, CMP/eGFR, lipid panel. EKG today

## 2019-05-25 NOTE — Addendum Note (Signed)
Addended by: Charlyne Petrin on: 05/25/2019 03:12 PM   Modules accepted: Orders

## 2019-05-25 NOTE — Telephone Encounter (Signed)
Rn received  A copy  EKG  That was obtained from  [facility]. Ekg read 48 . It was reviewed by Doctor of day - Dr Gardiner Rhyme.  per order from Dr Gardiner Rhyme, reduce Metoprolol XL to 100 mg daily from 200 mg daily  Have patient to have heart rate checked in @ 2 weeks at [facility].   left detailed message on patient's voicemail, will call tomorrow to verify patient received message    EKG  Placed to be scanned

## 2019-05-25 NOTE — Assessment & Plan Note (Addendum)
Stable, continue prn NTG, Atorvastatin 30mg  qd. Needs lipid panel, TSH

## 2019-05-25 NOTE — Assessment & Plan Note (Addendum)
Heart rate is 49 bpm,  asymptomatic,  continue Amiodarone, Eliquis. CBC/diff, f/u Cardiology-EKG, vent rate 49bpm. SR

## 2019-05-25 NOTE — Progress Notes (Signed)
Location:   clinic [place]   Place of Service:  Clinic (12) Provider: Marlana Latus NP  Code Status: DNR Goals of Care: [place] 04/25/2019  Does Patient Have a Medical Advance Directive? Yes  Type of Advance Directive [place]  Does patient want to make changes to medical advance directive? No - Patient declined  Copy of [place] in Chart? -  Pre-existing out of facility DNR order (yellow form or pink MOST form) -     Chief Complaint  Patient presents with  . Medical Management of Chronic Issues    Patient recently had a recent episode of agina. Patient will be seeing eye doctor  and is scheduled for podiatrist soon. She was cut at nail salon and think it may be infected. Its been weeks since incidient.   . Health Maintenance    PNA, TDAP, shingles vaccine.    HPI: Patient is a 86 y.o. female seen today for medical management of chronic diseases.    The patient has Hx of Afib, heart rate is in control, in 50s, denied SOB, chest pain/pressure, palpitation, dizziness or HA, on Amiodarone '200mg'$  qd, Metoprolol '200mg'$  qd, Eliquis '5mg'$  bid. HTN, blood pressure is controlled, on Enalapril '5mg'$  qd, Metoprolol '200mg'$  qd. GERD, stable, on Omeprazole '20mg'$  qd. CAD, stable, prn NTG, Atorvastatin.    Past Medical History:  Diagnosis Date  . Atherosclerosis of native coronary artery 2001   LAD & Cx disease -- BMS PCI in 2001; CABG 2002.  PCI in 2006 (per-op TAH)  . Breast cancer ([place])   . Cervix dysplasia   . GERD (gastroesophageal reflux disease)   . Heart attack ([place]) 2002, 2006   Both events reportedly were perioperatively. She has no recollection. Initial PCI was related to angina symptoms (bilateral arm pain)  . High cholesterol   . Hypertension   . Lumbosacral spinal stenosis   . Osteoarthritis   . Skin cancer   . Sleep apnea    Uses CPAP faithfully    Past Surgical History:  Procedure Laterality Date  . ABDOMINAL HYSTERECTOMY     . bone density    . CARDIAC CATHETERIZATION  2001    Dr. [facility]) 2 vessel CAD involving LAD-D1 and distal Cx-OM. Appearance of dilated tapered left main with no significant atherosclerosis on IVUS --  BMS PCI of pLAD ([facility] BMS 3.0 mm x 15 mm) & with PTCA of ostial D1, PTCA of dLAD.  BMS PCI pCx [facility] BMS 3.5 mm x 11 mm) - unable to reopen dCx-OM (probable distal Atheroembolic occlusion.  Normal LVEF.  Marland Kitchen COLONOSCOPY    . CORONARY ARTERY BYPASS GRAFT  2002   Unsure of details [facility] in [facility])  . CORONARY STENT INTERVENTION  2001   ([place]) [facility] BMS PCI pLAD (3.0 mm x 15 mm) -PTCA of jailed D1 as well as distal LAD; pCx ([facility] BMS 3.5 mm x 11 mm) -unable to open distalCx-OM -thromboembolic occlusion  . DIAGNOSTIC MAMMOGRAM    . LEFT HEART CATH AND CORS/GRAFTS ANGIOGRAPHY N/A 04/25/2019   Procedure: LEFT HEART CATH AND CORS/GRAFTS ANGIOGRAPHY;  Surgeon: Troy Sine, MD;  Location: [place] CV LAB;  Service: Cardiovascular;  Laterality: N/A;  . left knee replacement    . MASTECTOMY    . NM MYOVIEW LTD  12/2014; 2018   Cardiologist (Dr. Kellie Simmering.  [place], [place] ph# [phone]) -->LEXISCAN: Nonischemic, "normal "  . pap smear    . tonsilectomy    .  TRANSTHORACIC ECHOCARDIOGRAM  12/2014   Echocardiogram 12/25/14- normal LVEF and systolic function, EF 16-10%, mild grade 1 diastolic dysfunction    Allergies  Allergen Reactions  . Codeine Nausea Only    Allergies as of 05/25/2019      Reactions   Codeine Nausea Only      Medication List       Accurate as of May 25, 2019  3:07 PM. If you have any questions, ask your nurse or doctor.        acetaminophen 500 MG tablet Commonly known as: TYLENOL Take 500 mg by mouth at bedtime as needed (for mild pain and "sleep").   amiodarone 200 MG tablet Commonly known as: PACERONE Take 1 tablet (200 mg total) by mouth daily.   ammonium lactate 12 % cream Commonly known as: AMLACTIN Apply topically as  needed for dry skin.   apixaban 5 MG Tabs tablet Commonly known as: Eliquis Take 1 tablet (5 mg total) by mouth 2 (two) times daily.   atorvastatin 80 MG tablet Commonly known as: LIPITOR TAKE 1 TABLET DAILY What changed: when to take this   Benefiber Powd 2 spoonful mixed in water 3 times a day What changed:   how to take this  when to take this  additional instructions   enalapril 5 MG tablet Commonly known as: VASOTEC TAKE 1 TABLET DAILY   fexofenadine 180 MG tablet Commonly known as: ALLEGRA Take 180 mg by mouth daily as needed for allergies or rhinitis.   fluticasone 50 MCG/ACT nasal spray Commonly known as: FLONASE Place 1 spray into both nostrils daily as needed for allergies or rhinitis.   metoprolol 200 MG 24 hr tablet Commonly known as: TOPROL-XL Take 1 tablet (200 mg total) by mouth daily.   nitroGLYCERIN 0.4 MG SL tablet Commonly known as: NITROSTAT PLACE 1 TABLET UNDER TONGUE EVERY 5 MINUTES FOR 3 DOSES FOR CHEST PAIN What changed: See the new instructions.   omeprazole 20 MG tablet Commonly known as: PRILOSEC OTC Take 20 mg by mouth daily before breakfast.   Vitamin D3 50 MCG (2000 UT) Tabs Take 2,000 Units by mouth daily after breakfast.       Review of Systems:  Review of Systems  Constitutional: Negative for activity change, appetite change, fatigue and fever.  HENT: Negative for congestion, hearing loss and voice change.   Respiratory: Negative for cough, chest tightness, shortness of breath and wheezing.   Cardiovascular: Negative for chest pain, palpitations and leg swelling.  Gastrointestinal: Negative for abdominal distention, abdominal pain and constipation.  Genitourinary: Negative for difficulty urinating, dysuria and urgency.  Musculoskeletal: Positive for arthralgias and gait problem.       S/p L TKR, pain is resolved right knee OA s/p inj, improved left hip pain  f/u Ortho   Skin:       Lateral left great toe nail bed pain  from pedicure, no s/s of infection, redness, or warmth, possible irritation from pedicure, observe  Neurological: Negative for speech difficulty, light-headedness and headaches.       Memory lapses.   Hematological: Bruises/bleeds easily.  Psychiatric/Behavioral: Negative for agitation, behavioral problems and sleep disturbance.    Health Maintenance  Topic Date Due  . FOOT EXAM  Never done  . OPHTHALMOLOGY EXAM  Never done  . TETANUS/TDAP  Never done  . PNA vac Low Risk Adult (2 of 2 - PCV13) 01/12/2017  . HEMOGLOBIN A1C  06/21/2019  . INFLUENZA VACCINE  08/13/2019  . DEXA SCAN  Completed  . COVID-19 Vaccine  Completed    Physical Exam: Vitals:   05/25/19 1323  BP: 126/68  Pulse: (!) 48  Temp: (!) 97.1 F (36.2 C)  SpO2: 97%  Weight: 182 lb 12.8 oz (82.9 kg)  Height: 5' 7"  (1.702 m)   Body mass index is 28.63 kg/m. Physical Exam Constitutional:      Appearance: Normal appearance.  HENT:     Head: Normocephalic and atraumatic.     Nose: Nose normal.     Mouth/Throat:     Mouth: Mucous membranes are dry.  Eyes:     Extraocular Movements: Extraocular movements intact.     Pupils: Pupils are equal, round, and reactive to light.  Cardiovascular:     Rate and Rhythm: Normal rate and regular rhythm.     Heart sounds: Murmur present.  Pulmonary:     Effort: Pulmonary effort is normal.     Breath sounds: Normal breath sounds. No wheezing, rhonchi or rales.  Abdominal:     Palpations: Abdomen is soft.     Tenderness: There is no abdominal tenderness.  Musculoskeletal:     Cervical back: Normal range of motion and neck supple.     Right lower leg: No edema.     Left lower leg: No edema.  Skin:    General: Skin is warm and dry.     Comments: Right groin area. Lateral left great toe nail bed irritation from pedicure, no redness, open wound, warmth, or swelling, open toe shoes should be helpful.   Neurological:     General: No focal deficit present.     Mental Status:  She is alert and oriented to person, place, and time.     Motor: No weakness.     Coordination: Coordination normal.     Gait: Gait abnormal.  Psychiatric:        Mood and Affect: Mood normal.        Behavior: Behavior normal.        Thought Content: Thought content normal.        Judgment: Judgment normal.     Labs reviewed: Basic Metabolic Panel: Recent Labs    08/16/18 0730 12/21/18 1438 04/23/19 2003 04/24/19 0508 04/25/19 0616 04/25/19 1126  NA 142   < > 136 142 140  --   K 4.3   < > 3.7 4.0 4.3  --   CL 105   < > 102 105 105  --   CO2 29   < > 24 25 26   --   GLUCOSE 102*   < > 138* 114* 114*  --   BUN 15   < > 14 14 13   --   CREATININE 0.82   < > 0.87 0.87 0.87  --   CALCIUM 10.1   < > 9.1 9.4 9.4  --   TSH 2.35  --   --   --   --  2.890   < > = values in this interval not displayed.   Liver Function Tests: Recent Labs    08/16/18 0730 12/21/18 1438 04/23/19 2003  AST 17 20 22   ALT 18 20 18   ALKPHOS  --   --  46  BILITOT 0.9 0.8 1.0  PROT 7.2 6.8 6.7  ALBUMIN  --   --  3.6   No results for input(s): LIPASE, AMYLASE in the last 8760 hours. No results for input(s): AMMONIA in the last 8760 hours. CBC: Recent Labs    08/16/18  0730 08/16/18 0730 12/21/18 1438 12/21/18 1438 04/23/19 2003 04/24/19 0508 04/25/19 0616  WBC 6.7   < > 5.1   < > 8.2 7.1 7.9  NEUTROABS 3,558  --  3,045  --   --   --   --   HGB 13.0   < > 12.6   < > 13.1 12.3 12.2  HCT 39.3   < > 37.2   < > 41.1 37.8 37.9  MCV 96.1   < > 94.4   < > 100.7* 97.7 99.7  PLT 229   < > 164   < > 213 198 200   < > = values in this interval not displayed.   Lipid Panel: Recent Labs    08/16/18 0730 12/21/18 1438  CHOL 188 163  HDL 53 42*  LDLCALC 99 95  TRIG 240* 166*  CHOLHDL 3.5 3.9   Lab Results  Component Value Date   HGBA1C 6.3 (H) 12/21/2018    Procedures since last visit: No results found.  Assessment/Plan  Coronary artery disease involving coronary bypass graft of native  heart with angina pectoris (HCC) Stable, continue prn NTG, Atorvastatin 70m qd. Needs lipid panel, TSH  Atrial flutter/fibrillation Heart rate is 49 bpm,  asymptomatic,  continue Amiodarone, Eliquis. CBC/diff, f/u Cardiology-EKG, vent rate 49bpm. SR  Essential hypertension Blood pressure is in control, continue Carvedilol, Metoprolol. CMP/eGFR   Labs/tests ordered: lipid panel, TSH, CBC/diff, CMP/eGFR 06/01/19  Next appt:  06/01/2019

## 2019-05-25 NOTE — Addendum Note (Signed)
Addended by: Raiford Simmonds on: 05/25/2019 05:22 PM   Modules accepted: Orders

## 2019-05-25 NOTE — Assessment & Plan Note (Addendum)
Blood pressure is in control, continue Carvedilol, Metoprolol. CMP/eGFR

## 2019-05-25 NOTE — Telephone Encounter (Addendum)
Spoke to patient. She states she so  Primary NP- M. Mast.   it was reported heart rate was 48  Today during visit . Patient was informed to contact office. Patient voiced no complaints other than heart rate ,which she was not aware of.   she states she has taken metoprolol xl this morning.   Recent hospitalization was started on amiodarone and eliquis.   RN informed patient will contact  M Mast NP to see if an EKG  CAN BE OBTAINED AND FAXED TO OFFICE SINCE [facility]

## 2019-05-25 NOTE — Telephone Encounter (Addendum)
Called and spoke to  [facility]. Mast NP- clinic office at [facility]  [place]     They will contact patient  and do an EKG and fax to office to be evaluate   patient is aware.

## 2019-05-26 MED ORDER — METOPROLOL SUCCINATE ER 200 MG PO TB24: 100.0000 mg | ORAL_TABLET | Freq: Every day | ORAL | 3 refills | Status: DC

## 2019-05-26 NOTE — Telephone Encounter (Signed)
Spoke to patient . Patient states she received  Voice mail message from yesterdays call .  She states she  Cut Metoprolol succinate 200 mg - and cut it in half to take only 100 mg this morning.  patient states the medication is scored. She preferred to stay with current dose and just cut pill in half daily .  Patient states she has called and made an appointment with the clinic there at [facility] to have heart rate  taken in 2 weeks and she will call office with the number    Yesterday's EKG will be  Scanned into epic.

## 2019-05-26 NOTE — Addendum Note (Signed)
Addended by: Raiford Simmonds on: 05/26/2019 09:30 AM   Modules accepted: Orders

## 2019-05-29 NOTE — Telephone Encounter (Signed)
That is peripherally fine if she wants to cut the Toprol dose in half and just take 1/2 tablet daily.  Glenetta Hew, MD

## 2019-06-01 ENCOUNTER — Other Ambulatory Visit: Payer: Self-pay

## 2019-06-01 DIAGNOSIS — E785 Hyperlipidemia, unspecified: Secondary | ICD-10-CM

## 2019-06-01 DIAGNOSIS — I10 Essential (primary) hypertension: Secondary | ICD-10-CM

## 2019-06-01 DIAGNOSIS — R739 Hyperglycemia, unspecified: Secondary | ICD-10-CM

## 2019-06-02 LAB — HEMOGLOBIN A1C
Hgb A1c MFr Bld: 5.9 % of total Hgb — ABNORMAL HIGH (ref <5.7)
Mean Plasma Glucose: 123 (calc)
eAG (mmol/L): 6.8 (calc)

## 2019-06-02 LAB — COMPLETE METABOLIC PANEL WITH GFR
AG Ratio: 1.5 (calc) (ref 1.0–2.5)
ALT: 16 U/L (ref 6–29)
AST: 18 U/L (ref 10–35)
Albumin: 4.2 g/dL (ref 3.6–5.1)
Alkaline phosphatase (APISO): 50 U/L (ref 37–153)
BUN/Creatinine Ratio: 17 (calc) (ref 6–22)
BUN: 18 mg/dL (ref 7–25)
CO2: 30 mmol/L (ref 20–32)
Calcium: 10 mg/dL (ref 8.6–10.4)
Chloride: 103 mmol/L (ref 98–110)
Creat: 1.03 mg/dL — ABNORMAL HIGH (ref 0.60–0.88)
GFR, Est African American: 58 mL/min/{1.73_m2} — ABNORMAL LOW (ref >=60)
GFR, Est Non African American: 50 mL/min/{1.73_m2} — ABNORMAL LOW (ref >=60)
Globulin: 2.8 g/dL (calc) (ref 1.9–3.7)
Glucose, Bld: 112 mg/dL — ABNORMAL HIGH (ref 65–99)
Potassium: 4.5 mmol/L (ref 3.5–5.3)
Sodium: 141 mmol/L (ref 135–146)
Total Bilirubin: 0.6 mg/dL (ref 0.2–1.2)
Total Protein: 7 g/dL (ref 6.1–8.1)

## 2019-06-02 LAB — CBC WITH DIFFERENTIAL/PLATELET
Absolute Monocytes: 522 cells/uL (ref 200–950)
Basophils Absolute: 30 cells/uL (ref 0–200)
Basophils Relative: 0.5 %
Eosinophils Absolute: 102 cells/uL (ref 15–500)
Eosinophils Relative: 1.7 %
HCT: 39 % (ref 35.0–45.0)
Hemoglobin: 13 g/dL (ref 11.7–15.5)
Lymphs Abs: 2298 cells/uL (ref 850–3900)
MCH: 32.3 pg (ref 27.0–33.0)
MCHC: 33.3 g/dL (ref 32.0–36.0)
MCV: 96.8 fL (ref 80.0–100.0)
MPV: 10.5 fL (ref 7.5–12.5)
Monocytes Relative: 8.7 %
Neutro Abs: 3048 cells/uL (ref 1500–7800)
Neutrophils Relative %: 50.8 %
Platelets: 257 10*3/uL (ref 140–400)
RBC: 4.03 10*6/uL (ref 3.80–5.10)
RDW: 13.1 % (ref 11.0–15.0)
Total Lymphocyte: 38.3 %
WBC: 6 10*3/uL (ref 3.8–10.8)

## 2019-06-02 LAB — LIPID PANEL
Cholesterol: 193 mg/dL (ref <200)
HDL: 65 mg/dL (ref >=50)
LDL Cholesterol (Calc): 103 mg/dL (calc) — ABNORMAL HIGH
Non-HDL Cholesterol (Calc): 128 mg/dL (calc) (ref <130)
Total CHOL/HDL Ratio: 3 (calc) (ref <5.0)
Triglycerides: 152 mg/dL — ABNORMAL HIGH (ref <150)

## 2019-06-08 ENCOUNTER — Encounter: Payer: Self-pay | Admitting: Nurse Practitioner

## 2019-06-08 ENCOUNTER — Other Ambulatory Visit: Payer: Self-pay

## 2019-06-08 ENCOUNTER — Non-Acute Institutional Stay: Payer: Medicare Other | Admitting: Nurse Practitioner

## 2019-06-08 DIAGNOSIS — I10 Essential (primary) hypertension: Secondary | ICD-10-CM

## 2019-06-08 DIAGNOSIS — I249 Acute ischemic heart disease, unspecified: Secondary | ICD-10-CM | POA: Diagnosis not present

## 2019-06-08 DIAGNOSIS — I483 Typical atrial flutter: Secondary | ICD-10-CM | POA: Diagnosis not present

## 2019-06-08 DIAGNOSIS — I25709 Atherosclerosis of coronary artery bypass graft(s), unspecified, with unspecified angina pectoris: Secondary | ICD-10-CM

## 2019-06-08 DIAGNOSIS — K219 Gastro-esophageal reflux disease without esophagitis: Secondary | ICD-10-CM | POA: Diagnosis not present

## 2019-06-09 ENCOUNTER — Encounter: Payer: Self-pay | Admitting: Nurse Practitioner

## 2019-06-09 NOTE — Assessment & Plan Note (Signed)
Stable, LDL 65 05/31/19, continue Atorvastatin

## 2019-06-09 NOTE — Assessment & Plan Note (Signed)
Blood pressure is controlled, continue Enalapril 5mg  qd, Metoprolol 100mg  qd.

## 2019-06-09 NOTE — Progress Notes (Signed)
Location:   Clinic [facility]   Place of Service:  Clinic (12) Provider: Marlana Latus NP  Code Status: DNR Goals of Care: [place] 04/25/2019  Does Patient Have a Medical Advance Directive? Yes  Type of [place] Directive [place]  Does patient want to make changes to medical advance directive? No - Patient declined  Copy of [place] in Chart? -  Pre-existing out of facility DNR order (yellow form or pink MOST form) -     Chief Complaint  Patient presents with  . Medical Management of Chronic Issues    Patient returns to the clinic to discuss her heart condition. She had a catherization 2 weeks ago.     HPI: Patient is a 86 y.o. female seen today for medical management of chronic diseases.    The patient was noted to have slow heart beat in 49bpm last visit 05/25/19, Metoprolol was reduced to 100mg  qd subsequently per cardiology. The patient takes Amiodarone 200mg  qd, Eliquis 5mg  bid for AFib. The patient is asymptomatic today. Her blood pressure is controlled on Enalapril 5mg  qd, Metoprolol 100mg  qd. GERD, stable, takes Omeprazole 20mg  qd. CAD, stable, Cath 04/25/19, prn NTG, takes Atorvastatin.    Past Medical History:  Diagnosis Date  . Atherosclerosis of native coronary artery 2001   LAD & Cx disease -- BMS PCI in 2001; CABG 2002.  PCI in 2006 (per-op TAH)  . Breast cancer ([place])   . Cervix dysplasia   . GERD (gastroesophageal reflux disease)   . Heart attack ([place]) 2002, 2006   Both events reportedly were perioperatively. She has no recollection. Initial PCI was related to angina symptoms (bilateral arm pain)  . High cholesterol   . Hypertension   . Lumbosacral spinal stenosis   . Osteoarthritis   . Skin cancer   . Sleep apnea    Uses CPAP faithfully    Past Surgical History:  Procedure Laterality Date  . ABDOMINAL HYSTERECTOMY    . bone density    . CARDIAC CATHETERIZATION  2001    Dr. Glade [facility]) 2 vessel  CAD involving LAD-D1 and distal Cx-OM. Appearance of dilated tapered left main with no significant atherosclerosis on IVUS --  BMS PCI of pLAD ([facility] BMS 3.0 mm x 15 mm) & with PTCA of ostial D1, PTCA of dLAD.  BMS PCI pCx [facility] BMS 3.5 mm x 11 mm) - unable to reopen dCx-OM (probable distal Atheroembolic occlusion.  Normal LVEF.  Marland Kitchen COLONOSCOPY    . CORONARY ARTERY BYPASS GRAFT  2002   Unsure of details [facility] in [facility])  . CORONARY STENT INTERVENTION  2001   ([employer]) [facility] BMS PCI pLAD (3.0 mm x 15 mm) -PTCA of jailed D1 as well as distal LAD; pCx ([facility] BMS 3.5 mm x 11 mm) -unable to open distalCx-OM -thromboembolic occlusion  . DIAGNOSTIC MAMMOGRAM    . LEFT HEART CATH AND CORS/GRAFTS ANGIOGRAPHY N/A 04/25/2019   Procedure: LEFT HEART CATH AND CORS/GRAFTS ANGIOGRAPHY;  Surgeon: Troy Sine, MD;  Location: [place] CV LAB;  Service: Cardiovascular;  Laterality: N/A;  . left knee replacement    . MASTECTOMY    . NM MYOVIEW LTD  12/2014; 2018   Cardiologist (Dr. Kellie Simmering.  [place], [place] ph# [phone]) -->LEXISCAN: Nonischemic, "normal "  . pap smear    . tonsilectomy    . TRANSTHORACIC ECHOCARDIOGRAM  12/2014   Echocardiogram 12/25/14- normal LVEF and systolic function, EF 99991111, mild grade  1 diastolic dysfunction    Allergies  Allergen Reactions  . Codeine Nausea Only    Allergies as of 06/08/2019      Reactions   Codeine Nausea Only      Medication List       Accurate as of Jun 08, 2019 11:59 PM. If you have any questions, ask your nurse or doctor.        acetaminophen 500 MG tablet Commonly known as: TYLENOL Take 500 mg by mouth at bedtime as needed (for mild pain and "sleep").   amiodarone 200 MG tablet Commonly known as: PACERONE Take 1 tablet (200 mg total) by mouth daily.   ammonium lactate 12 % cream Commonly known as: AMLACTIN Apply topically as needed for dry skin.   apixaban 5 MG Tabs tablet Commonly known as: Eliquis Take 1 tablet  (5 mg total) by mouth 2 (two) times daily.   atorvastatin 80 MG tablet Commonly known as: LIPITOR TAKE 1 TABLET DAILY What changed: when to take this   Benefiber Powd 2 spoonful mixed in water 3 times a day What changed:   how to take this  when to take this  additional instructions   enalapril 5 MG tablet Commonly known as: VASOTEC TAKE 1 TABLET DAILY   fexofenadine 180 MG tablet Commonly known as: ALLEGRA Take 180 mg by mouth daily as needed for allergies or rhinitis.   fluticasone 50 MCG/ACT nasal spray Commonly known as: FLONASE Place 1 spray into both nostrils daily as needed for allergies or rhinitis.   metoprolol 200 MG 24 hr tablet Commonly known as: TOPROL-XL Take 0.5 tablets (100 mg total) by mouth daily.   nitroGLYCERIN 0.4 MG SL tablet Commonly known as: NITROSTAT PLACE 1 TABLET UNDER TONGUE EVERY 5 MINUTES FOR 3 DOSES FOR CHEST PAIN What changed: See the new instructions.   omeprazole 20 MG tablet Commonly known as: PRILOSEC OTC Take 20 mg by mouth daily before breakfast.   Vitamin D3 50 MCG (2000 UT) Tabs Take 2,000 Units by mouth daily after breakfast.       Review of Systems:  Review of Systems  Constitutional: Negative for activity change, appetite change and fever.  HENT: Negative for congestion, hearing loss and voice change.   Respiratory: Negative for cough, chest tightness, shortness of breath and wheezing.   Cardiovascular: Negative for chest pain, palpitations and leg swelling.  Gastrointestinal: Negative for abdominal pain and constipation.  Genitourinary: Negative for difficulty urinating, dysuria and urgency.  Musculoskeletal: Positive for arthralgias and gait problem.       S/p L TKR, pain is resolved right knee OA s/p inj, improved left hip pain  f/u Ortho   Skin:       Lateral left great toe nail bed pain from pedicure, no s/s of infection, redness, or warmth, possible irritation from pedicure, observe  Neurological: Negative  for dizziness, speech difficulty and headaches.       Memory lapses.   Hematological: Bruises/bleeds easily.  Psychiatric/Behavioral: Negative for agitation, behavioral problems and sleep disturbance.    Health Maintenance  Topic Date Due  . FOOT EXAM  Never done  . OPHTHALMOLOGY EXAM  Never done  . TETANUS/TDAP  Never done  . PNA vac Low Risk Adult (2 of 2 - PCV13) 01/12/2017  . INFLUENZA VACCINE  08/13/2019  . HEMOGLOBIN A1C  12/01/2019  . DEXA SCAN  Completed  . COVID-19 Vaccine  Completed    Physical Exam: Vitals:   06/08/19 1433  BP: 118/64  Pulse: (!) 52  Temp: (!) 96.6 F (35.9 C)  SpO2: 96%  Weight: 183 lb 6.4 oz (83.2 kg)  Height: 5\' 4"  (1.626 m)   Body mass index is 31.48 kg/m. Physical Exam Constitutional:      Appearance: Normal appearance.  HENT:     Head: Normocephalic and atraumatic.     Nose: Nose normal.     Mouth/Throat:     Mouth: Mucous membranes are dry.  Eyes:     Extraocular Movements: Extraocular movements intact.     Pupils: Pupils are equal, round, and reactive to light.  Cardiovascular:     Rate and Rhythm: Regular rhythm. Bradycardia present.     Heart sounds: Murmur present.     Comments: HR 52 bpm Pulmonary:     Effort: Pulmonary effort is normal.     Breath sounds: Normal breath sounds. No wheezing or rales.  Abdominal:     General: Bowel sounds are normal.     Palpations: Abdomen is soft.  Musculoskeletal:     Cervical back: Normal range of motion and neck supple.     Right lower leg: No edema.     Left lower leg: No edema.  Skin:    General: Skin is warm and dry.     Comments: Right groin area. Lateral left great toe nail bed irritation from pedicure, no redness, open wound, warmth, or swelling, open toe shoes should be helpful.   Neurological:     General: No focal deficit present.     Mental Status: She is alert and oriented to person, place, and time.     Motor: No weakness.     Coordination: Coordination normal.      Gait: Gait abnormal.  Psychiatric:        Mood and Affect: Mood normal.        Behavior: Behavior normal.        Thought Content: Thought content normal.        Judgment: Judgment normal.     Labs reviewed: Basic Metabolic Panel: Recent Labs    08/16/18 0730 12/21/18 1438 04/24/19 0508 04/25/19 0616 04/25/19 1126 05/31/19 0859  NA 142   < > 142 140  --  141  K 4.3   < > 4.0 4.3  --  4.5  CL 105   < > 105 105  --  103  CO2 29   < > 25 26  --  30  GLUCOSE 102*   < > 114* 114*  --  112*  BUN 15   < > 14 13  --  18  CREATININE 0.82   < > 0.87 0.87  --  1.03*  CALCIUM 10.1   < > 9.4 9.4  --  10.0  TSH 2.35  --   --   --  2.890  --    < > = values in this interval not displayed.   Liver Function Tests: Recent Labs    12/21/18 1438 04/23/19 2003 05/31/19 0859  AST 20 22 18   ALT 20 18 16   ALKPHOS  --  46  --   BILITOT 0.8 1.0 0.6  PROT 6.8 6.7 7.0  ALBUMIN  --  3.6  --    No results for input(s): LIPASE, AMYLASE in the last 8760 hours. No results for input(s): AMMONIA in the last 8760 hours. CBC: Recent Labs    08/16/18 0730 08/16/18 0730 12/21/18 1438 04/23/19 2003 04/24/19 0508 04/25/19 0616 05/31/19 0859  WBC 6.7   < >  5.1   < > 7.1 7.9 6.0  NEUTROABS 3,558  --  3,045  --   --   --  3,048  HGB 13.0   < > 12.6   < > 12.3 12.2 13.0  HCT 39.3   < > 37.2   < > 37.8 37.9 39.0  MCV 96.1   < > 94.4   < > 97.7 99.7 96.8  PLT 229   < > 164   < > 198 200 257   < > = values in this interval not displayed.   Lipid Panel: Recent Labs    08/16/18 0730 12/21/18 1438 05/31/19 0859  CHOL 188 163 193  HDL 53 42* 65  LDLCALC 99 95 103*  TRIG 240* 166* 152*  CHOLHDL 3.5 3.9 3.0   Lab Results  Component Value Date   HGBA1C 5.9 (H) 05/31/2019    Procedures since last visit: No results found.  Assessment/Plan  Atrial flutter/fibrillation 4/11-4/13 hospital angina, Cardiac Cath, medical therapy, AFib, started Amiodarone, Eliquis.  05/25/19 Metoprolol was  reduced to 100mg  qd, HR was 49bpm 05/29/19 HR 52 bpm, will let cardiology know, continue Metoprolol 100mg  qd, Amiodarone 200mg  qd, Eliquis 5mg  bid.   Coronary artery disease involving coronary bypass graft of native heart with angina pectoris (HCC) Stable, LDL 65 05/31/19, continue Atorvastatin  Essential hypertension Blood pressure is controlled, continue Enalapril 5mg  qd, Metoprolol 100mg  qd.   Gastroesophageal reflux disease Stable, continue Omeprazole.    Labs/tests ordered:  None  Next appt:  10/23/2019

## 2019-06-09 NOTE — Assessment & Plan Note (Signed)
Stable, continue Omeprazole.  

## 2019-06-09 NOTE — Patient Instructions (Signed)
Report the patient's heart rate 52 bpm to her cardiology, nurse name Lauren.

## 2019-06-09 NOTE — Assessment & Plan Note (Signed)
4/11-4/13 hospital angina, Cardiac Cath, medical therapy, AFib, started Amiodarone, Eliquis.  05/25/19 Metoprolol was reduced to 100mg  qd, HR was 49bpm 05/29/19 HR 52 bpm, will let cardiology know, continue Metoprolol 100mg  qd, Amiodarone 200mg  qd, Eliquis 5mg  bid.

## 2019-06-15 ENCOUNTER — Telehealth: Payer: Self-pay

## 2019-06-15 ENCOUNTER — Telehealth: Payer: Self-pay | Admitting: Cardiology

## 2019-06-15 NOTE — Telephone Encounter (Signed)
STAT if HR is under 50 or over 120 (normal HR is 60-100 beats per minute)  1) What is your heart rate? 52  2) Do you have a log of your heart rate readings (document readings)? No   3) Do you have any other symptoms? No symptoms   Mickey Farber from [facility] is calling to report the patient's low HR this morning. The patient's HR is 52, but no symptoms are present. Please advise.

## 2019-06-15 NOTE — Telephone Encounter (Signed)
Heart rate of 52 at rest is acceptable. That is the effect of the Toprol. If he is able to get up and walk around, and his heart rate goes up above 70, would only adjust the Toprol doses for heart rates consistently less than 50.  Glenetta Hew, MD

## 2019-06-15 NOTE — Telephone Encounter (Signed)
Discussed with Dr. Allison Quarry office (Patient's cardiologist) [phone] patient's HR of 52 w/ no symptoms to possibly adjust medication.  Asley took msg to have someone return call.

## 2019-06-15 NOTE — Telephone Encounter (Signed)
Stephanie Shea returned call stating she will call the patient to get a comparison.

## 2019-06-15 NOTE — Telephone Encounter (Signed)
Spoke with [employer]. Patient's Hr was 52 on 5/27 at last appointment. She is unsure of what patient's current HR is. Patient lives independently. Patient has continued to take her 100mg  of toprol XL.   Called patient, patient does not check BP or Pulse daily. Patient unable to obtain a blood pressure cuff on her own. Patient provided with automatic BP cuff by office and given a BP/HR diary. Patient advised to check BP and HR for the next few days and to call back early next week with readings.   Patient instructed to call back if she becomes symptomatic. Patient verbalized understanding.

## 2019-06-16 ENCOUNTER — Telehealth: Payer: Self-pay | Admitting: Cardiology

## 2019-06-16 NOTE — Telephone Encounter (Signed)
Patient calling with BP reading:Today 169/73  Patient would like to know how often she is to call with the readings.

## 2019-06-16 NOTE — Telephone Encounter (Signed)
Spoke with patient. Patient was calling with blood pressure readings but she was supposed to call with her HR. She did not write that down. Patient is going to purchase a pulse ox and monitor her resting heart rate and heart rate in action for the next few days and call back with readings.

## 2019-06-22 NOTE — Telephone Encounter (Signed)
Pt aware readings below are okay Per Dr Allison Quarry previous recommendations Pt will continue to monitor and will call if needed Pt verbalizes understanding ./cy

## 2019-06-22 NOTE — Telephone Encounter (Signed)
New Message:   Pt is calling back with her readings: 06-18-19- Heart rate was 98 and oxygen was 50   06-19-19 heart rate was 93 and oxygen was 53

## 2019-06-26 ENCOUNTER — Other Ambulatory Visit: Payer: Self-pay | Admitting: Nurse Practitioner

## 2019-06-26 DIAGNOSIS — M159 Polyosteoarthritis, unspecified: Secondary | ICD-10-CM

## 2019-07-12 ENCOUNTER — Other Ambulatory Visit: Payer: Self-pay | Admitting: Nurse Practitioner

## 2019-07-12 DIAGNOSIS — M159 Polyosteoarthritis, unspecified: Secondary | ICD-10-CM

## 2019-07-31 ENCOUNTER — Other Ambulatory Visit: Payer: Self-pay | Admitting: Nurse Practitioner

## 2019-08-02 ENCOUNTER — Encounter: Payer: Self-pay | Admitting: Internal Medicine

## 2019-08-03 ENCOUNTER — Non-Acute Institutional Stay: Payer: Medicare Other | Admitting: Nurse Practitioner

## 2019-08-03 ENCOUNTER — Other Ambulatory Visit: Payer: Self-pay

## 2019-08-03 DIAGNOSIS — I483 Typical atrial flutter: Secondary | ICD-10-CM | POA: Diagnosis not present

## 2019-08-03 DIAGNOSIS — I249 Acute ischemic heart disease, unspecified: Secondary | ICD-10-CM

## 2019-08-03 DIAGNOSIS — I10 Essential (primary) hypertension: Secondary | ICD-10-CM | POA: Diagnosis not present

## 2019-08-03 DIAGNOSIS — K219 Gastro-esophageal reflux disease without esophagitis: Secondary | ICD-10-CM

## 2019-08-03 DIAGNOSIS — I25709 Atherosclerosis of coronary artery bypass graft(s), unspecified, with unspecified angina pectoris: Secondary | ICD-10-CM | POA: Diagnosis not present

## 2019-08-03 NOTE — Assessment & Plan Note (Signed)
Blood pressures is controlled, continue Enalapril, Metoprolol.

## 2019-08-03 NOTE — Assessment & Plan Note (Signed)
Stable, continue Omeprazole.  

## 2019-08-03 NOTE — Assessment & Plan Note (Signed)
stable, Cath 04/25/19, prn NTG, takes Atorvastatin.

## 2019-08-03 NOTE — Progress Notes (Signed)
Location:   clinic [place]   Place of Service:  Clinic (12) Provider: Marlana Latus NP  Code Status: DNR Goals of Care: [place] 08/04/2019  Does Patient Have a Medical Advance Directive? Yes  Type of Paramedic of [place];Out of facility DNR (pink MOST or yellow form)  Does patient want to make changes to medical advance directive? No - Patient declined  Copy of [place] in Chart? Yes - validated most recent copy scanned in chart (See row information)  Pre-existing out of facility DNR order (yellow form or pink MOST form) Yellow form placed in chart (order not valid for inpatient use);Pink MOST form placed in chart (order not valid for inpatient use)     Chief Complaint  Patient presents with  . Medical Management of Chronic Issues    32-month followup. Complains of being excessively tired at night since medication changes after cardiac cath. Did have COVID in December.     HPI: Patient is a 86 y.o. female seen today for medical management of chronic diseases.      Afib, slow heart beat in 49bpm visit 05/25/19, Metoprolol was reduced to 100mg  qd subsequently per cardiology. The patient takes Amiodarone 200mg  qd, Eliquis 5mg  bid for AFib.   Her blood pressure is controlled on Enalapril 5mg  qd, Metoprolol 100mg  qd.   GERD, stable, takes Omeprazole 20mg  qd.   CAD, stable, Cath 04/25/19, prn NTG, takes Atorvastatin.   Past Medical History:  Diagnosis Date  . Atherosclerosis of native coronary artery 2001   LAD & Cx disease -- BMS PCI in 2001; CABG 2002.  PCI in 2006 (per-op [facility])  . Breast cancer ([place])   . Cervix dysplasia   . GERD (gastroesophageal reflux disease)   . Heart attack ([place]) 2002, 2006   Both events reportedly were perioperatively. She has no recollection. Initial PCI was related to angina symptoms (bilateral arm pain)  . High cholesterol   . Hypertension   . Lumbosacral spinal stenosis   . Osteoarthritis   . Skin  cancer   . Sleep apnea    Uses CPAP faithfully    Past Surgical History:  Procedure Laterality Date  . ABDOMINAL HYSTERECTOMY    . bone density    . CARDIAC CATHETERIZATION  2001    Dr. Glade Lloyd [facility]) 2 vessel CAD involving LAD-D1 and distal Cx-OM. Appearance of dilated tapered left main with no significant atherosclerosis on IVUS --  BMS PCI of pLAD ([facility] BMS 3.0 mm x 15 mm) & with PTCA of ostial D1, PTCA of dLAD.  BMS PCI pCx [facility] BMS 3.5 mm x 11 mm) - unable to reopen dCx-OM (probable distal Atheroembolic occlusion.  Normal LVEF.  Marland Kitchen COLONOSCOPY    . CORONARY ARTERY BYPASS GRAFT  2002   Unsure of details [facility], in [facility])  . CORONARY STENT INTERVENTION  2001   ([place]) [facility] BMS PCI pLAD (3.0 mm x 15 mm) -PTCA of jailed D1 as well as distal LAD; pCx ([facility] BMS 3.5 mm x 11 mm) -unable to open distalCx-OM -thromboembolic occlusion  . DIAGNOSTIC MAMMOGRAM    . LEFT HEART CATH AND CORS/GRAFTS ANGIOGRAPHY N/A 04/25/2019   Procedure: LEFT HEART CATH AND CORS/GRAFTS ANGIOGRAPHY;  Surgeon: Troy Sine, MD;  Location: [place] CV LAB;  Service: Cardiovascular;  Laterality: N/A;  . left knee replacement    . MASTECTOMY    . NM MYOVIEW LTD  12/2014; 2018   Cardiologist (Dr. Kellie Simmering.  [place], [place] ph# [phone]) -->LEXISCAN: Nonischemic, "normal "  . pap smear    . tonsilectomy    . TRANSTHORACIC ECHOCARDIOGRAM  12/2014   Echocardiogram 12/25/14- normal LVEF and systolic function, EF 18-29%, mild grade 1 diastolic dysfunction    Allergies  Allergen Reactions  . Codeine Nausea Only    Allergies as of 08/03/2019      Reactions   Codeine Nausea Only      Medication List       Accurate as of August 03, 2019 11:59 PM. If you have any questions, ask your nurse or doctor.        acetaminophen 500 MG tablet Commonly known as: TYLENOL Take 500 mg by mouth at bedtime as needed (for mild pain and "sleep").   amiodarone 200 MG tablet Commonly known as:  PACERONE Take 1 tablet (200 mg total) by mouth daily.   ammonium lactate 12 % cream Commonly known as: AMLACTIN Apply topically as needed for dry skin.   apixaban 5 MG Tabs tablet Commonly known as: Eliquis Take 1 tablet (5 mg total) by mouth 2 (two) times daily.   atorvastatin 80 MG tablet Commonly known as: LIPITOR TAKE 1 TABLET DAILY   Benefiber Powd 2 spoonful mixed in water 3 times a day What changed:   how to take this  when to take this  additional instructions   enalapril 5 MG tablet Commonly known as: VASOTEC TAKE 1 TABLET DAILY   fexofenadine 180 MG tablet Commonly known as: ALLEGRA Take 180 mg by mouth daily as needed for allergies or rhinitis.   fluticasone 50 MCG/ACT nasal spray Commonly known as: FLONASE Place 1 spray into both nostrils daily as needed for allergies or rhinitis.   metoprolol 200 MG 24 hr tablet Commonly known as: TOPROL-XL Take 0.5 tablets (100 mg total) by mouth daily.   nitroGLYCERIN 0.4 MG SL tablet Commonly known as: NITROSTAT PLACE 1 TABLET UNDER TONGUE EVERY 5 MINUTES FOR 3 DOSES FOR CHEST PAIN What changed: See the new instructions.   omeprazole 20 MG tablet Commonly known as: PRILOSEC OTC Take 20 mg by mouth daily before breakfast.   Vitamin D3 50 MCG (2000 UT) Tabs Take 2,000 Units by mouth daily after breakfast.       Review of Systems:  Review of Systems  Constitutional: Negative for activity change, appetite change and fever.  HENT: Negative for congestion, hearing loss and voice change.   Respiratory: Negative for cough, chest tightness, shortness of breath and wheezing.   Cardiovascular: Negative for chest pain, palpitations and leg swelling.  Gastrointestinal: Negative for abdominal pain and constipation.  Genitourinary: Negative for difficulty urinating, dysuria and urgency.  Musculoskeletal: Positive for arthralgias and gait problem.       S/p L TKR, pain is resolved right knee OA s/p inj, improved left  hip pain  f/u Ortho   Skin:       Lateral left great toe nail bed pain from pedicure, no s/s of infection, redness, or warmth, possible irritation from pedicure, observe  Neurological: Negative for dizziness, speech difficulty and headaches.       Memory lapses.   Hematological: Bruises/bleeds easily.  Psychiatric/Behavioral: Negative for agitation, behavioral problems and sleep disturbance.    Health Maintenance  Topic Date Due  . OPHTHALMOLOGY EXAM  Never done  . TETANUS/TDAP  Never done  . PNA vac Low Risk Adult (2 of 2 - PCV13) 01/12/2017  . INFLUENZA VACCINE  08/13/2019  . HEMOGLOBIN A1C  12/01/2019  .  FOOT EXAM  12/13/2019  . DEXA SCAN  Completed  . COVID-19 Vaccine  Completed    Physical Exam: Vitals:   08/04/19 1027 08/04/19 1029  BP: (!) 130/68   Pulse: 53 54  Temp: (!) 97.3 F (36.3 C)   TempSrc: Temporal   SpO2: 96%   Weight: 185 lb 12.8 oz (84.3 kg)   Height: 5\' 4"  (1.626 m)    Body mass index is 31.89 kg/m. Physical Exam Constitutional:      Appearance: Normal appearance.  HENT:     Head: Normocephalic and atraumatic.     Nose: Nose normal.     Mouth/Throat:     Mouth: Mucous membranes are dry.  Eyes:     Extraocular Movements: Extraocular movements intact.     Pupils: Pupils are equal, round, and reactive to light.  Cardiovascular:     Rate and Rhythm: Regular rhythm. Bradycardia present.     Heart sounds: Murmur heard.      Comments: HR 52 bpm Pulmonary:     Effort: Pulmonary effort is normal.     Breath sounds: Normal breath sounds. No wheezing or rales.  Abdominal:     General: Bowel sounds are normal.     Palpations: Abdomen is soft.  Musculoskeletal:     Cervical back: Normal range of motion and neck supple.     Right lower leg: No edema.     Left lower leg: No edema.  Skin:    General: Skin is warm and dry.     Comments: Right groin area. Lateral left great toe nail bed irritation from pedicure, no redness, open wound, warmth, or  swelling, open toe shoes should be helpful.   Neurological:     General: No focal deficit present.     Mental Status: She is alert and oriented to person, place, and time.     Motor: No weakness.     Coordination: Coordination normal.     Gait: Gait abnormal.  Psychiatric:        Mood and Affect: Mood normal.        Behavior: Behavior normal.        Thought Content: Thought content normal.        Judgment: Judgment normal.     Labs reviewed: Basic Metabolic Panel: Recent Labs    08/16/18 0730 12/21/18 1438 04/24/19 0508 04/25/19 0616 04/25/19 1126 05/31/19 0859  NA 142   < > 142 140  --  141  K 4.3   < > 4.0 4.3  --  4.5  CL 105   < > 105 105  --  103  CO2 29   < > 25 26  --  30  GLUCOSE 102*   < > 114* 114*  --  112*  BUN 15   < > 14 13  --  18  CREATININE 0.82   < > 0.87 0.87  --  1.03*  CALCIUM 10.1   < > 9.4 9.4  --  10.0  TSH 2.35  --   --   --  2.890  --    < > = values in this interval not displayed.   Liver Function Tests: Recent Labs    12/21/18 1438 04/23/19 2003 05/31/19 0859  AST 20 22 18   ALT 20 18 16   ALKPHOS  --  46  --   BILITOT 0.8 1.0 0.6  PROT 6.8 6.7 7.0  ALBUMIN  --  3.6  --    No results  for input(s): LIPASE, AMYLASE in the last 8760 hours. No results for input(s): AMMONIA in the last 8760 hours. CBC: Recent Labs    08/16/18 0730 08/16/18 0730 12/21/18 1438 04/23/19 2003 04/24/19 0508 04/25/19 0616 05/31/19 0859  WBC 6.7   < > 5.1   < > 7.1 7.9 6.0  NEUTROABS 3,558  --  3,045  --   --   --  3,048  HGB 13.0   < > 12.6   < > 12.3 12.2 13.0  HCT 39.3   < > 37.2   < > 37.8 37.9 39.0  MCV 96.1   < > 94.4   < > 97.7 99.7 96.8  PLT 229   < > 164   < > 198 200 257   < > = values in this interval not displayed.   Lipid Panel: Recent Labs    08/16/18 0730 12/21/18 1438 05/31/19 0859  CHOL 188 163 193  HDL 53 42* 65  LDLCALC 99 95 103*  TRIG 240* 166* 152*  CHOLHDL 3.5 3.9 3.0   Lab Results  Component Value Date   HGBA1C  5.9 (H) 05/31/2019    Procedures since last visit: No results found.  Assessment/Plan  Essential hypertension Blood pressures is controlled, continue Enalapril, Metoprolol.   Atrial flutter/fibrillation Afib, slow heart beat in 49bpm visit 05/25/19, Metoprolol was reduced to 100mg  qd subsequently per cardiology. The patient takes Amiodarone 200mg  qd, Eliquis 5mg  bid for AFib.    Coronary artery disease involving coronary bypass graft of native heart with angina pectoris (HCC) stable, Cath 04/25/19, prn NTG, takes Atorvastatin.   Gastroesophageal reflux disease Stable, continue Omeprazole.    Labs/tests ordered:  None  Next appt:  10/23/2019

## 2019-08-03 NOTE — Assessment & Plan Note (Signed)
Afib, slow heart beat in 49bpm visit 05/25/19, Metoprolol was reduced to 100mg  qd subsequently per cardiology. The patient takes Amiodarone 200mg  qd, Eliquis 5mg  bid for AFib.

## 2019-08-04 ENCOUNTER — Encounter: Payer: Self-pay | Admitting: Nurse Practitioner

## 2019-08-08 ENCOUNTER — Encounter: Payer: Self-pay | Admitting: Nurse Practitioner

## 2019-08-08 DIAGNOSIS — N183 Chronic kidney disease, stage 3 unspecified: Secondary | ICD-10-CM | POA: Insufficient documentation

## 2019-08-08 LAB — COMPREHENSIVE METABOLIC PANEL
ALT: 27 IU/L (ref 0–32)
AST: 22 IU/L (ref 0–40)
Albumin/Globulin Ratio: 1.6 (ref 1.2–2.2)
Albumin: 4.4 g/dL (ref 3.6–4.6)
Alkaline Phosphatase: 61 IU/L (ref 48–121)
BUN/Creatinine Ratio: 14 (ref 12–28)
BUN: 13 mg/dL (ref 8–27)
Bilirubin Total: 0.7 mg/dL (ref 0.0–1.2)
CO2: 26 mmol/L (ref 20–29)
Calcium: 10.1 mg/dL (ref 8.7–10.3)
Chloride: 101 mmol/L (ref 96–106)
Creatinine, Ser: 0.96 mg/dL (ref 0.57–1.00)
GFR calc Af Amer: 63 mL/min/{1.73_m2} (ref >59)
GFR calc non Af Amer: 55 mL/min/{1.73_m2} — ABNORMAL LOW (ref >59)
Globulin, Total: 2.8 g/dL (ref 1.5–4.5)
Glucose: 105 mg/dL — ABNORMAL HIGH (ref 65–99)
Potassium: 4.4 mmol/L (ref 3.5–5.2)
Sodium: 140 mmol/L (ref 134–144)
Total Protein: 7.2 g/dL (ref 6.0–8.5)

## 2019-08-08 LAB — CBC
Hematocrit: 39.7 % (ref 34.0–46.6)
Hemoglobin: 13.7 g/dL (ref 11.1–15.9)
MCH: 32.9 pg (ref 26.6–33.0)
MCHC: 34.5 g/dL (ref 31.5–35.7)
MCV: 95 fL (ref 79–97)
Platelets: 230 10*3/uL (ref 150–450)
RBC: 4.17 x10E6/uL (ref 3.77–5.28)
RDW: 12.6 % (ref 11.7–15.4)
WBC: 6.1 10*3/uL (ref 3.4–10.8)

## 2019-08-08 LAB — TSH: TSH: 1.43 u[IU]/mL (ref 0.450–4.500)

## 2019-08-11 ENCOUNTER — Ambulatory Visit (INDEPENDENT_AMBULATORY_CARE_PROVIDER_SITE_OTHER): Payer: Medicare Other | Admitting: Cardiology

## 2019-08-11 ENCOUNTER — Encounter: Payer: Self-pay | Admitting: Cardiology

## 2019-08-11 ENCOUNTER — Other Ambulatory Visit: Payer: Self-pay

## 2019-08-11 VITALS — BP 154/80 | HR 63 | Ht 67.0 in | Wt 185.0 lb

## 2019-08-11 DIAGNOSIS — I249 Acute ischemic heart disease, unspecified: Secondary | ICD-10-CM

## 2019-08-11 DIAGNOSIS — E785 Hyperlipidemia, unspecified: Secondary | ICD-10-CM | POA: Diagnosis not present

## 2019-08-11 DIAGNOSIS — I10 Essential (primary) hypertension: Secondary | ICD-10-CM

## 2019-08-11 DIAGNOSIS — Z9989 Dependence on other enabling machines and devices: Secondary | ICD-10-CM

## 2019-08-11 DIAGNOSIS — I25119 Atherosclerotic heart disease of native coronary artery with unspecified angina pectoris: Secondary | ICD-10-CM | POA: Diagnosis not present

## 2019-08-11 DIAGNOSIS — I25709 Atherosclerosis of coronary artery bypass graft(s), unspecified, with unspecified angina pectoris: Secondary | ICD-10-CM | POA: Diagnosis not present

## 2019-08-11 DIAGNOSIS — I480 Paroxysmal atrial fibrillation: Secondary | ICD-10-CM

## 2019-08-11 DIAGNOSIS — G4733 Obstructive sleep apnea (adult) (pediatric): Secondary | ICD-10-CM

## 2019-08-11 DIAGNOSIS — I5032 Chronic diastolic (congestive) heart failure: Secondary | ICD-10-CM

## 2019-08-11 MED ORDER — ENALAPRIL MALEATE 10 MG PO TABS: 10.0000 mg | ORAL_TABLET | Freq: Every day | ORAL | 3 refills | Status: DC

## 2019-08-11 MED ORDER — METOPROLOL SUCCINATE ER 50 MG PO TB24: 50.0000 mg | ORAL_TABLET | Freq: Every day | ORAL | 3 refills | Status: DC

## 2019-08-11 NOTE — Progress Notes (Signed)
Primary Care Provider: Mast, [name], NP Cardiologist: Glenetta Hew, MD Electrophysiologist: None  Clinic Note: Chief Complaint  Patient presents with  . Follow-up    A little more tired than usual, goes to sleep early.  . Coronary Artery Disease    No angina  . Atrial Fibrillation    Has noted bradycardia.  Not able to tell if she is in A. fib or not.   HPI:    Stephanie Shea is a 86 y.o. female with a PMH notable for CAD-CABG (2002 after initial attempts at PCI to LCx and LAD, then had PCI following CABG), OSA on CPAP, HTN, HLD and now new diagnosis of atrial fib/flutter who presents today for second post hospital follow-up.   CAD history dates back to 2001 -- progressive exertional angina (bilateral arm aching. 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  2001 -- ([place], Dr. Glade Lloyd) Cath-PCI of pLAD &PTCA of ostial D1 (jailed), dLAD PTCA. PCI of pCx, but unable to open dCx-OM. IVIS of left main  ? Peri-op MI 2002 --> CABG ([place])  [facility], peri-op MI for TAH -- PCI  Last STRESS TEST in 2018 by former Cardiologist (Dr. Kellie Simmering. [place], [place] ph# [phone]) - no further action  Moved to [place].  Initially seen by me in January 2019.  Stable.  04/2019 - Admitted for angina (required 2nd NTG)-> cath;   Stephanie Shea was last seen by me back on March 28, 2018.  She usually was doing cardio drumming for exercise, but was not able to do this because of injuring her arm.  She maybe felt a little more tired than usual, but also notes that she was staying up till 1 -2 in the morning..  Had only used nitroglycerin once in 3 years.  Noted off-and-on flip-flop in her chest but no prolonged palpitations.  Recent Hospitalizations:   Admitted April 11-13, 2021 with chest/neck pain and elevated troponin.  Symptoms began while eating dinner that did not resolve after nitroglycerin.  EMS contacted.  Was chest pain-free upon arrival to  ER. ->  Troponin levels increased to 89. ->  Upon arrival was in sinus rhythm, but following morning was noted to be in A. fib/flutter with rates of 140-150 bpm.   Oral amiodarone load initiated leading to chemical cardioversion in sinus rhythm.  Plan was to reduce amiodarone from 400 mg twice daily to 20 mg daily on April 19.  Toprol was continued at 200 mg daily.  Was started on Eliquis 5 mg twice daily.  Enalapril was continued along with Lipitor.  She was seen by Jory Sims, NP on May 03, 2019 for post hospital follow-up -> no complaints.  Only hip and left leg pain with long walks or sitting too long.  No bleeding or excessive bruising.  No sensation of further chest pain or irregular heartbeat/palpitation.  Noted to be bradycardic with first-degree AV block -> patient changes made at that time. --> Noted to be bradycardic with rate 40 bpm with PCP follow-up, metoprolol dose reduced to 100 mg daily. ->  Called back heart rates were in the 50s.  Reviewed  CV studies:    The following studies were reviewed today: (if available, images/films reviewed: From Epic Chart or Care Everywhere) . TTE 04/24/2019: EF 65 to 70%.  Moderate septal LVH.  GR 1 DD-elevated EDP.  Normal PAP.  Relatively normal valves.  No aortic stenosis. Marland Kitchen LHC 04/25/2019: ost-prox LCx stent @  OM1 ostium =100% CTO, Ost-prox LAD stent ~30% ISR, Ost D1 CTO, prox RCA ~30%. SCG-OM2 patent with ostial stent 30%, LIMA-LAD patent; CTO of SVG-D1.    Interval History:   Stephanie Shea presents here today with her daughter.  She is doing quite well.  No real major complaints.  She is a little bit of trivial edema in the left leg, but not significant.  No PND orthopnea.  She denies any sensation of chest pain or pressure since discharge.  She is also not noted any irregular heartbeats or palpitations.  What she does note is that she is totally worn out earlier than usual.  She makes it about 9 PM and then is just totally unable to do  any also has to go to bed.  Prior to this he was able to stay up till past midnight on most nights.  She says that she has a little less get up and go, but not really noting any fatigue per se.  She is still pretty much limited by her arthritis pains.  CV Review of Symptoms (Summary) Cardiovascular ROS: positive for - Feeling tired, mild lack of energy negative for - chest pain, dyspnea on exertion, irregular heartbeat, murmur, orthopnea, palpitations, paroxysmal nocturnal dyspnea, rapid heart rate, shortness of breath or Lightheadedness or dizziness, syncope/near syncope, TIA/amaurosis fugax or claudication.  The patient does not have symptoms concerning for COVID-19 infection (fever, chills, cough, or new shortness of breath).  The patient is practicing social distancing & Masking.   Has completed Covid vaccines back in January  REVIEWED OF SYSTEMS   Review of Systems  Constitutional: Positive for malaise/fatigue (More tired than usual.  Good sleep earlier.). Negative for weight loss.  HENT: Negative for congestion.   Respiratory: Negative for cough and shortness of breath.   Cardiovascular: Positive for leg swelling (Distal left leg/ankle).  Gastrointestinal: Negative for abdominal pain and blood in stool.  Genitourinary: Negative for hematuria.  Musculoskeletal: Positive for back pain and joint pain. Negative for falls.  Neurological: Negative for dizziness and focal weakness.  Endo/Heme/Allergies: Bruises/bleeds easily.  Psychiatric/Behavioral: Positive for memory loss. The patient is not nervous/anxious and does not have insomnia.     I have reviewed and (if needed) personally updated the patient's problem list, medications, allergies, past medical and surgical history, social and family history.   PAST MEDICAL HISTORY   Past Medical History:  Diagnosis Date  . Atherosclerosis of native coronary artery 2001   LAD & Cx disease -- BMS PCI in 2001; CABG 2002.  PCI in 2006  (per-op TAH)  . Atrial fibrillation and flutter ([place]) 04/2019   New diagnosis-major complaint was chest tightness and dyspnea.  . Breast cancer ([place])   . Cervix dysplasia   . GERD (gastroesophageal reflux disease)   . Heart attack ([place]) 2002, 2006   Both events reportedly were perioperatively. She has no recollection. Initial PCI was related to angina symptoms (bilateral arm pain)  . High cholesterol   . Hypertension   . Lumbosacral spinal stenosis   . Osteoarthritis   . Skin cancer   . Sleep apnea    Uses CPAP faithfully    PAST SURGICAL HISTORY   Past Surgical History:  Procedure Laterality Date  . ABDOMINAL HYSTERECTOMY    . bone density    . CARDIAC CATHETERIZATION  2001    Dr. Glade Lloyd [facility]) 2 vessel CAD involving LAD-D1 and distal Cx-OM. Appearance of dilated tapered left main with no significant atherosclerosis on IVUS --  BMS PCI of pLAD ([facility] BMS 3.0 mm x 15 mm) & with PTCA of ostial D1, PTCA of dLAD.  BMS PCI pCx [facility] BMS 3.5 mm x 11 mm) - unable to reopen dCx-OM (probable distal Atheroembolic occlusion.  Normal LVEF.  Marland Kitchen COLONOSCOPY    . CORONARY ARTERY BYPASS GRAFT  2002   Unsure of details [facility], in [facility])  . CORONARY STENT INTERVENTION  2001   ([place]) [facility] BMS PCI pLAD (3.0 mm x 15 mm) -PTCA of jailed D1 as well as distal LAD; pCx ([facility] BMS 3.5 mm x 11 mm) -unable to open distalCx-OM -thromboembolic occlusion  . DIAGNOSTIC MAMMOGRAM    . LEFT HEART CATH AND CORS/GRAFTS ANGIOGRAPHY N/A 04/25/2019   Procedure: LEFT HEART CATH AND CORS/GRAFTS ANGIOGRAPHY;  Surgeon: Troy Sine, MD;  Location: [facility] INVASIVE CV LAB;;; ost-prox LCx stent @ OM1 ostium =100% CTO, Ost-prox LAD stent ~30% ISR, Ost D1 CTO, prox RCA ~30%. SCG-OM2 patent with ostial stent 30%, LIMA-LAD patent; CTO of SVG-D1.   Marland Kitchen left knee replacement    . MASTECTOMY    . NM MYOVIEW LTD  12/2014; 2018   Cardiologist (Dr. Kellie Simmering.  [place], [place] ph# [phone]) -->LEXISCAN:  Nonischemic, "normal "  . pap smear    . tonsilectomy    . TRANSTHORACIC ECHOCARDIOGRAM  12/2014   Echocardiogram 12/25/14- normal LVEF and systolic function, EF 79-02%, mild grade 1 diastolic dysfunction  . TRANSTHORACIC ECHOCARDIOGRAM  04/24/2019   EF 65 to 70%.  Moderate septal LVH.  GR 1 DD-elevated EDP.  Normal PAP.  Relatively normal valves.  No aortic stenosis.    Diagnostic Dominance: Right     MEDICATIONS/ALLERGIES   Current Meds  Medication Sig  . acetaminophen (TYLENOL) 500 MG tablet Take 500 mg by mouth at bedtime as needed (for mild pain and "sleep").   Marland Kitchen amiodarone (PACERONE) 200 MG tablet Take 1 tablet (200 mg total) by mouth daily.  Marland Kitchen ammonium lactate (AMLACTIN) 12 % cream Apply topically as needed for dry skin.  Marland Kitchen apixaban (ELIQUIS) 5 MG TABS tablet Take 1 tablet (5 mg total) by mouth 2 (two) times daily.  Marland Kitchen atorvastatin (LIPITOR) 80 MG tablet TAKE 1 TABLET DAILY  . Cholecalciferol (VITAMIN D3) 50 MCG (2000 UT) TABS Take 2,000 Units by mouth daily after breakfast.  . enalapril (VASOTEC) 10 MG tablet Take 1 tablet (10 mg total) by mouth daily.  . fexofenadine (ALLEGRA) 180 MG tablet Take 180 mg by mouth daily as needed for allergies or rhinitis.   . fluticasone (FLONASE) 50 MCG/ACT nasal spray Place 1 spray into both nostrils daily as needed for allergies or rhinitis.   . metoprolol (TOPROL-XL) 50 MG 24 hr tablet Take 1 tablet (50 mg total) by mouth daily.  . nitroGLYCERIN (NITROSTAT) 0.4 MG SL tablet PLACE 1 TABLET UNDER TONGUE EVERY 5 MINUTES FOR 3 DOSES FOR CHEST PAIN  . omeprazole (PRILOSEC OTC) 20 MG tablet Take 20 mg by mouth daily before breakfast.  . Wheat Dextrin (BENEFIBER) POWD 2 spoonful mixed in water 3 times a day (Patient taking differently: Take by mouth See admin instructions. Mix 2 teaspoonsful into 6-8 ounces of water or other beverage and drink twice a day)  . [DISCONTINUED] enalapril (VASOTEC) 5 MG tablet TAKE 1 TABLET DAILY  . [DISCONTINUED]  metoprolol (TOPROL-XL) 200 MG 24 hr tablet Take 0.5 tablets (100 mg total) by mouth daily.    Allergies  Allergen Reactions  . Codeine Nausea Only    SOCIAL HISTORY/FAMILY  HISTORY   Reviewed in Epic:  Pertinent findings: Is a resident [facility] [place].  Usually accompanied by her daughter.  Limited walking because of spinal stenosis issues.  OBJCTIVE -PE, EKG, labs   Wt Readings from Last 3 Encounters:  08/11/19 185 lb (83.9 kg)  08/04/19 185 lb 12.8 oz (84.3 kg)  06/08/19 183 lb 6.4 oz (83.2 kg)    Physical Exam: BP (!) 154/80   Pulse 63   Ht 5\' 7"  (1.702 m)   Wt 185 lb (83.9 kg)   SpO2 96%   BMI 28.98 kg/m  Physical Exam Constitutional:      General: She is not in acute distress.    Appearance: Normal appearance. She is normal weight. She is not ill-appearing.  HENT:     Head: Normocephalic and atraumatic.  Cardiovascular:     Rate and Rhythm: Normal rate and regular rhythm.     Pulses: Normal pulses.     Heart sounds: Murmur (1/6C-D SEM at RUSB--neck) heard.  No friction rub. No gallop.      Comments: Nondisplaced PMI.  No ectopy Pulmonary:     Effort: Pulmonary effort is normal. No respiratory distress.     Breath sounds: Normal breath sounds.  Musculoskeletal:        General: Swelling (Trivial bilateral) present. Normal range of motion.     Comments: Was wheeled in on a wheelchair.  Neurological:     General: No focal deficit present.     Mental Status: She is alert and oriented to person, place, and time.  Psychiatric:        Mood and Affect: Mood normal.        Behavior: Behavior normal.        Thought Content: Thought content normal.        Judgment: Judgment normal.     Comments: Mildly confused, but otherwise pleasant     Adult ECG Report None  Recent Labs: No new labs since May Lab Results  Component Value Date   CHOL 193 05/31/2019   HDL 65 05/31/2019   LDLCALC 103 (H) 05/31/2019   TRIG 152 (H) 05/31/2019   CHOLHDL 3.0  05/31/2019   Lab Results  Component Value Date   CREATININE 0.96 08/07/2019   BUN 13 08/07/2019   NA 140 08/07/2019   K 4.4 08/07/2019   CL 101 08/07/2019   CO2 26 08/07/2019   Lab Results  Component Value Date   TSH 1.430 08/07/2019    ASSESSMENT/PLAN    Problem List Items Addressed This Visit    Coronary artery disease involving coronary bypass graft of native heart with angina pectoris (HCC) (Chronic)    Occluded vein graft to the diagonal branch that is now occluded.  No option for PCI.  No angina.      Relevant Medications   metoprolol (TOPROL-XL) 50 MG 24 hr tablet   enalapril (VASOTEC) 10 MG tablet   Paroxysmal atrial fibrillation/flutter (HCC) - Primary (Chronic)    Apparently was noted to be in atrial flutter flutter fib back in May at the PCP office.  She has had bradycardia and hypotension. Currently on amiodarone--however she continues to have intermittent episodes of A. fib, would prefer to probably avoid longstanding amiodarone.  Plan for now is to reduce her metoprolol further to 50 mg daily to avoid bradycardia and increase ACE inhibitor.  Continue Eliquis at 5 mg twice daily.  No signs of renal failure.  No bleeding.  This patients CHA2DS2-VASc  Score and unadjusted Ischemic Stroke Rate (% per year) is equal to 7.2 % stroke rate/year from a score of 5  Above score calculated as 1 point each if present [CHF, HTN, DM, Vascular=MI/PAD/Aortic Plaque, Age if 65-74, or Female] Above score calculated as 2 points each if present [Age > 75, or Stroke/TIA/TE]       Relevant Medications   metoprolol (TOPROL-XL) 50 MG 24 hr tablet   enalapril (VASOTEC) 10 MG tablet   Coronary artery disease involving native coronary artery of native heart with angina pectoris (HCC) (Chronic)    She has an occluded LCx with OM1 as well as D1.  Otherwise patent LIMA-LAD, SVG-OM with patent stent. She did have angina and mildly elevated troponin levels with A. fib RVR.  This related to  demand ischemia.  Thankfully, with routine activities, she is not having any angina. Plan for now is to maximize medical management--with sinus bradycardia, I am backing down on her beta-blocker and increasing ACE inhibitor, she is on amiodarone which also gives beta-blocker effect.  If additional blood pressure control or a tangential as needed, may consider amlodipine.  She is no longer on aspirin because she is on Eliquis. On high-dose high intensity statin, would like to see trend with increased dose of statin.      Relevant Medications   metoprolol (TOPROL-XL) 50 MG 24 hr tablet   enalapril (VASOTEC) 10 MG tablet   Essential hypertension (Chronic)    Blood pressure is little high today, and with her issues of bradycardia, my plan is to increase enalapril and decrease Toprol.  Plan: Increase enalapril 10 mg daily and decrease Toprol to 50 mg daily.  Okay to allow for mild permissive hypertension given her age and dizziness.      Relevant Medications   metoprolol (TOPROL-XL) 50 MG 24 hr tablet   enalapril (VASOTEC) 10 MG tablet   Hyperlipidemia (Chronic)    Unfortunately, her lipids were drawn fraction on atorvastatin.  Would like to see a trend and then determin if we should make a change to rosuvastatin plus/minus ezetimibe.  I suspect some of this is related to her new living arrangements and admittedly eating more than she used to( also more heavy foods).      Relevant Medications   metoprolol (TOPROL-XL) 50 MG 24 hr tablet   enalapril (VASOTEC) 10 MG tablet   Other Relevant Orders   Lipid panel   OSA on CPAP (Chronic)    Continue CPAP      Diastolic CHF (HCC) (Chronic)    This was probably part of the reason why she is symptomatic with A. fib.  She did have evidence of elevated LVEDP on echo in April.  Thankfully, only grade 1 diastolic dysfunction which is normal for age.  In the absence of A. fib, she is not really have any heart failure symptoms.  Euvolemic on  exam and not requiring diuretics.  Plan is increase afterload reduction with enalapril increased to 10 mg.  While on amiodarone, will reduce Toprol to 50 mg daily.      Relevant Medications   metoprolol (TOPROL-XL) 50 MG 24 hr tablet   enalapril (VASOTEC) 10 MG tablet       COVID-19 Education: The signs and symptoms of COVID-19 were discussed with the patient and how to seek care for testing (follow up with PCP or arrange E-visit).   The importance of social distancing and COVID-19 vaccination was discussed today.  I spent a total of 5minutes with  the patient. >  50% of the time was spent in direct patient consultation.  Additional time spent with chart review  / charting (studies, outside notes, etc): 12 Total Time: 35 min   Current medicines are reviewed at length with the patient today.  (+/- concerns) none  Notice: This dictation was prepared with Dragon dictation along with smaller phrase technology. Any transcriptional errors that result from this process are unintentional and may not be corrected upon review.  Patient Instructions / Medication Changes & Studies & Tests Ordered   Patient Instructions  Medication Instructions:   INCREASE Enalapril to 10 mg daily  DECREASE Toprol XL to 50 mg daily  *If you need a refill on your cardiac medications before your next appointment, please call your pharmacy*  Lab Work: NONE ordered at this time of appointment   If you have labs (blood work) drawn today and your tests are completely normal, you will receive your results only by: Marland Kitchen MyChart Message (if you have MyChart) OR . A paper copy in the mail If you have any lab test that is abnormal or we need to change your treatment, we will call you to review the results.  Testing/Procedures: Your physician recommends that you return for a FASTING lipid profile the morning of your follow up appointment in January 2022:    Fasting Lipid Panel-DO NOT EAT OR DRINK PAST MIDNIGHT.  OKAY TO HAVE WATER  Follow-Up: At [facility], you and your health needs are our priority.  As part of our continuing mission to provide you with exceptional heart care, we have created designated Provider Care Teams.  These Care Teams include your primary Cardiologist (physician) and Advanced Practice Providers (APPs -  Physician Assistants and Nurse Practitioners) who all work together to provide you with the care you need, when you need it.  We recommend signing up for the patient portal called "MyChart".  Sign up information is provided on this After Visit Summary.  MyChart is used to connect with patients for Virtual Visits (Telemedicine).  Patients are able to view lab/test results, encounter notes, upcoming appointments, etc.  Non-urgent messages can be sent to your provider as well.   To learn more about what you can do with MyChart, go to [email or web address].    Your next appointment:   6 month(s)  The format for your next appointment:   In Person  Provider:   Glenetta Hew, MD   Other Instructions    Studies Ordered:   Orders Placed This Encounter  Procedures  . Lipid panel     Glenetta Hew, M.D., M.S. Interventional Cardiologist   Pager # [phone] Phone # [phone] [address] [place], [place] [address]   Thank you for choosing Heartcare at [facility]!!

## 2019-08-11 NOTE — Patient Instructions (Addendum)
Medication Instructions:   INCREASE Enalapril to 10 mg daily  DECREASE Toprol XL to 50 mg daily  *If you need a refill on your cardiac medications before your next appointment, please call your pharmacy*  Lab Work: NONE ordered at this time of appointment   If you have labs (blood work) drawn today and your tests are completely normal, you will receive your results only by: Marland Kitchen MyChart Message (if you have MyChart) OR . A paper copy in the mail If you have any lab test that is abnormal or we need to change your treatment, we will call you to review the results.  Testing/Procedures: Your physician recommends that you return for a FASTING lipid profile the morning of your follow up appointment in January 2022:    Fasting Lipid Panel-DO NOT EAT OR DRINK PAST MIDNIGHT. OKAY TO HAVE WATER  Follow-Up: At [facility], you and your health needs are our priority.  As part of our continuing mission to provide you with exceptional heart care, we have created designated Provider Care Teams.  These Care Teams include your primary Cardiologist (physician) and Advanced Practice Providers (APPs -  Physician Assistants and Nurse Practitioners) who all work together to provide you with the care you need, when you need it.  We recommend signing up for the patient portal called "MyChart".  Sign up information is provided on this After Visit Summary.  MyChart is used to connect with patients for Virtual Visits (Telemedicine).  Patients are able to view lab/test results, encounter notes, upcoming appointments, etc.  Non-urgent messages can be sent to your provider as well.   To learn more about what you can do with MyChart, go to [email or web address].    Your next appointment:   6 month(s)  The format for your next appointment:   In Person  Provider:   Glenetta Hew, MD   Other Instructions

## 2019-08-17 ENCOUNTER — Encounter: Payer: Self-pay | Admitting: Cardiology

## 2019-08-17 NOTE — Assessment & Plan Note (Signed)
Unfortunately, her lipids were drawn fraction on atorvastatin.  Would like to see a trend and then determin if we should make a change to rosuvastatin plus/minus ezetimibe.  I suspect some of this is related to her new living arrangements and admittedly eating more than she used to( also more heavy foods).

## 2019-08-17 NOTE — Assessment & Plan Note (Signed)
Apparently was noted to be in atrial flutter flutter fib back in May at the PCP office.  She has had bradycardia and hypotension. Currently on amiodarone--however she continues to have intermittent episodes of A. fib, would prefer to probably avoid longstanding amiodarone.  Plan for now is to reduce her metoprolol further to 50 mg daily to avoid bradycardia and increase ACE inhibitor.  Continue Eliquis at 5 mg twice daily.  No signs of renal failure.  No bleeding.  This patients CHA2DS2-VASc Score and unadjusted Ischemic Stroke Rate (% per year) is equal to 7.2 % stroke rate/year from a score of 5  Above score calculated as 1 point each if present [CHF, HTN, DM, Vascular=MI/PAD/Aortic Plaque, Age if 65-74, or Female] Above score calculated as 2 points each if present [Age > 75, or Stroke/TIA/TE]

## 2019-08-17 NOTE — Assessment & Plan Note (Signed)
Occluded vein graft to the diagonal branch that is now occluded.  No option for PCI.  No angina.

## 2019-08-17 NOTE — Assessment & Plan Note (Signed)
This was probably part of the reason why she is symptomatic with A. fib.  She did have evidence of elevated LVEDP on echo in April.  Thankfully, only grade 1 diastolic dysfunction which is normal for age.  In the absence of A. fib, she is not really have any heart failure symptoms.  Euvolemic on exam and not requiring diuretics.  Plan is increase afterload reduction with enalapril increased to 10 mg.  While on amiodarone, will reduce Toprol to 50 mg daily.

## 2019-08-17 NOTE — Assessment & Plan Note (Signed)
Blood pressure is little high today, and with her issues of bradycardia, my plan is to increase enalapril and decrease Toprol.  Plan: Increase enalapril 10 mg daily and decrease Toprol to 50 mg daily.  Okay to allow for mild permissive hypertension given her age and dizziness.

## 2019-08-17 NOTE — Assessment & Plan Note (Signed)
Continue CPAP.  

## 2019-08-17 NOTE — Assessment & Plan Note (Addendum)
She has an occluded LCx with OM1 as well as D1.  Otherwise patent LIMA-LAD, SVG-OM with patent stent. She did have angina and mildly elevated troponin levels with A. fib RVR.  This related to demand ischemia.  Thankfully, with routine activities, she is not having any angina. Plan for now is to maximize medical management--with sinus bradycardia, I am backing down on her beta-blocker and increasing ACE inhibitor, she is on amiodarone which also gives beta-blocker effect.  If additional blood pressure control or a tangential as needed, may consider amlodipine.  She is no longer on aspirin because she is on Eliquis. On high-dose high intensity statin, would like to see trend with increased dose of statin.

## 2019-09-04 ENCOUNTER — Telehealth: Payer: Self-pay | Admitting: *Deleted

## 2019-09-04 NOTE — Telephone Encounter (Signed)
Caregiver, Jonelle Sidle Weeks called and stated that patient is concerned with Skin Cancers. Caregiver got her an appointment with Dr. Denna Haggard for the end of January 2022 but they feel this is too far out and wonders if you could get her a sooner appointment. Would like to know if you could expedite.  (a lot of Dermatology offices are booked out) Please Advise.

## 2019-09-04 NOTE — Telephone Encounter (Signed)
I will suggest her to check with the Dermatology on site at [facility] for possible early appointment.

## 2019-10-20 ENCOUNTER — Other Ambulatory Visit: Payer: Self-pay | Admitting: Cardiology

## 2019-10-20 MED ORDER — METOPROLOL SUCCINATE ER 50 MG PO TB24: 50.0000 mg | ORAL_TABLET | Freq: Every day | ORAL | 2 refills | Status: DC

## 2019-10-20 NOTE — Telephone Encounter (Signed)
Rx has been sent to the pharmacy electronically. ° °

## 2019-10-20 NOTE — Telephone Encounter (Signed)
*  STAT* If patient is at the pharmacy, call can be transferred to refill team.   1. Which medications need to be refilled? (please list name of each medication and dose if known)  metoprolol (TOPROL-XL) 50 MG 24 hr tablet  2. Which pharmacy/location (including street and city if local pharmacy) is medication to be sent to? [id number] [place], [place] - [address]  3. Do they need a 30 day or 90 day supply? 90 with refills  Patient is out of medication

## 2019-10-23 ENCOUNTER — Telehealth: Payer: Self-pay | Admitting: *Deleted

## 2019-10-23 ENCOUNTER — Other Ambulatory Visit: Payer: Self-pay

## 2019-10-23 ENCOUNTER — Encounter: Payer: Medicare Other | Admitting: Nurse Practitioner

## 2019-10-23 NOTE — Telephone Encounter (Signed)
Patient called and stated that you are suppose to call her soon. Stated that they have dropped the house phone behind some heave furniture and maintenance has to come get it out.   They want you to call [phone] instead.

## 2019-10-23 NOTE — Telephone Encounter (Signed)
I am not aware of her expecting phone call from me. Can you check with her, find out her concerns? Thank you

## 2019-10-24 NOTE — Telephone Encounter (Signed)
Patient was on your schedule yesterday for [facility] at 11:30 for a AWV by telephone.   Patient stated that she has no complaints and that she was contacted for the AWV.

## 2019-10-24 NOTE — Telephone Encounter (Signed)
Sorry that I was not aware of the appointment. Please schedule her AWV in my clinic [place] on Thursdays.

## 2019-11-21 DIAGNOSIS — Z23 Encounter for immunization: Secondary | ICD-10-CM | POA: Diagnosis not present

## 2019-11-29 ENCOUNTER — Other Ambulatory Visit: Payer: Self-pay | Admitting: Nurse Practitioner

## 2019-11-29 DIAGNOSIS — Z780 Asymptomatic menopausal state: Secondary | ICD-10-CM

## 2019-11-29 DIAGNOSIS — M8580 Other specified disorders of bone density and structure, unspecified site: Secondary | ICD-10-CM

## 2019-12-01 ENCOUNTER — Telehealth: Payer: Self-pay | Admitting: Cardiology

## 2019-12-01 NOTE — Telephone Encounter (Signed)
Returned call to patient-she wanted to confirm dosing of enalapril and metoprolol since this was changed at last OV.     Enalapril 10 mg  Toprol XL 50 mg  This is what she is taking and verbalized understanding.

## 2019-12-01 NOTE — Telephone Encounter (Signed)
Patient calling to verify mg for enalapril (VASOTEC) 10 MG tablet. Patient is unsure if she should be taking 5 mg or 10 mg. Please call/advise.   Thank you!

## 2019-12-16 ENCOUNTER — Other Ambulatory Visit: Payer: Self-pay | Admitting: Nurse Practitioner

## 2019-12-18 NOTE — Telephone Encounter (Signed)
Spoke with patient, patient states she did not request rx from mail order pharmacy. Patient states she uses the local pharmacy.   RX refused from [place]. Patient patient states her daughter will be picking up rx from local pharmacy today

## 2020-01-23 DIAGNOSIS — Z85828 Personal history of other malignant neoplasm of skin: Secondary | ICD-10-CM | POA: Diagnosis not present

## 2020-01-23 DIAGNOSIS — L814 Other melanin hyperpigmentation: Secondary | ICD-10-CM | POA: Diagnosis not present

## 2020-01-23 DIAGNOSIS — L821 Other seborrheic keratosis: Secondary | ICD-10-CM | POA: Diagnosis not present

## 2020-01-23 DIAGNOSIS — L570 Actinic keratosis: Secondary | ICD-10-CM | POA: Diagnosis not present

## 2020-01-23 DIAGNOSIS — L720 Epidermal cyst: Secondary | ICD-10-CM | POA: Diagnosis not present

## 2020-01-24 ENCOUNTER — Telehealth: Payer: Self-pay | Admitting: Cardiology

## 2020-01-24 NOTE — Telephone Encounter (Signed)
*  STAT* If patient is at the pharmacy, call can be transferred to refill team.   1. Which medications need to be refilled? (please list name of each medication and dose if known) Amiodarone  2. Which pharmacy/location (including street and city if local pharmacy) is medication to be sent to? [facility] [address] [place]  3. Do they need a 30 day or 90 day supply? 90 days and refills

## 2020-01-29 ENCOUNTER — Telehealth: Payer: Self-pay | Admitting: Cardiology

## 2020-01-29 MED ORDER — ENALAPRIL MALEATE 10 MG PO TABS: 10.0000 mg | ORAL_TABLET | Freq: Every day | ORAL | 3 refills | Status: DC

## 2020-01-29 NOTE — Telephone Encounter (Signed)
     *  STAT* If patient is at the pharmacy, call can be transferred to refill team.   1. Which medications need to be refilled? (please list name of each medication and dose if known) enalapril (VASOTEC) 10 MG tablet  2. Which pharmacy/location (including street and city if local pharmacy) is medication to be sent to?[id number] [place], [place] - [address]  3. Do they need a 30 day or 90 day supply? 90 days  Pt said she needs it as soon as possible since her daughter is on her way to pick it up and she is ou of medications

## 2020-01-29 NOTE — Telephone Encounter (Signed)
Prescription sent to pt's pharmacy. 

## 2020-02-05 ENCOUNTER — Telehealth: Payer: Self-pay | Admitting: Cardiology

## 2020-02-05 NOTE — Telephone Encounter (Signed)
Pt state she received a text from the pharmacy stating they need pt's provider to contact them regarding pt's enalapril. Nurse contact pharmacy and was informed everything is clear on their end. They received new Rx on the 17th and pt has already picked up prescription. Pt made aware.  Pt also state her ears are currently stopped up. Nurse advised pt to contact pcp. Pt verbalized understanding.

## 2020-02-05 NOTE — Telephone Encounter (Signed)
Pt c/o medication issue:  1. Name of Medication: enalapril (VASOTEC) 10 MG tablet  2. How are you currently taking this medication (dosage and times per day)? 1 tablet daily  3. Are you having a reaction (difficulty breathing--STAT)? no  4. What is your medication issue? Patient states the pharmacy told her the office needs to contact them about the medication. She also states her ears are stopped up.

## 2020-02-12 ENCOUNTER — Other Ambulatory Visit: Payer: Self-pay

## 2020-02-12 ENCOUNTER — Encounter: Payer: Self-pay | Admitting: Dermatology

## 2020-02-12 ENCOUNTER — Ambulatory Visit (INDEPENDENT_AMBULATORY_CARE_PROVIDER_SITE_OTHER): Payer: Medicare Other | Admitting: Dermatology

## 2020-02-12 DIAGNOSIS — D044 Carcinoma in situ of skin of scalp and neck: Secondary | ICD-10-CM | POA: Diagnosis not present

## 2020-02-12 DIAGNOSIS — Z85828 Personal history of other malignant neoplasm of skin: Secondary | ICD-10-CM

## 2020-02-12 DIAGNOSIS — L570 Actinic keratosis: Secondary | ICD-10-CM

## 2020-02-12 DIAGNOSIS — L821 Other seborrheic keratosis: Secondary | ICD-10-CM | POA: Diagnosis not present

## 2020-02-12 DIAGNOSIS — L820 Inflamed seborrheic keratosis: Secondary | ICD-10-CM | POA: Diagnosis not present

## 2020-02-12 DIAGNOSIS — Z1283 Encounter for screening for malignant neoplasm of skin: Secondary | ICD-10-CM | POA: Diagnosis not present

## 2020-02-12 DIAGNOSIS — D485 Neoplasm of uncertain behavior of skin: Secondary | ICD-10-CM

## 2020-02-12 DIAGNOSIS — C4492 Squamous cell carcinoma of skin, unspecified: Secondary | ICD-10-CM

## 2020-02-12 HISTORY — DX: Squamous cell carcinoma of skin, unspecified: C44.92

## 2020-02-12 NOTE — Patient Instructions (Signed)

## 2020-02-19 ENCOUNTER — Encounter: Payer: Self-pay | Admitting: Dermatology

## 2020-02-19 NOTE — Telephone Encounter (Signed)
Path to patient and surgery made

## 2020-02-19 NOTE — Telephone Encounter (Signed)
-----   Message from Lavonna Monarch, MD sent at 02/17/2020  7:15 AM EST ----- Schedule 30 minutes with Dr. Darene Lamer for biopsy #2 on neck.

## 2020-02-19 NOTE — Progress Notes (Signed)
New Patient   Subjective  Stephanie Shea is a 87 y.o. female who presents for the following: Annual Exam (New spots everyday- been treated at [facility] by PA - history of bcc & scc).  Multiple crusts with history of multiple spots being frozen by visiting physician assistant.  History of skin cancers. Location: Mostly face chest and neck Duration:  Quality:  Associated Signs/Symptoms: Modifying Factors:  Severity:  Timing: Context: Would like general skin examination  The following portions of the chart were reviewed this encounter and updated as appropriate:  Tobacco  Allergies  Meds  Problems  Med Hx  Surg Hx  Fam Hx      Objective  Well appearing patient in no apparent distress; mood and affect are within normal limits. Objective  Right Breast: Full body skin examination- no atypical moles or non mole skin cacner  Objective  Mid Back: Multiple 3 to 8 mm brown textured flattopped papules   Objective  Mid Forehead: 7 mm pink crust, I SK versus CIS     Objective  Left Neck - Anterior: Waxy 7 mm crust, CIS versus I SK       Objective  Right Chest: 7 mm crust       Objective  Chest - Medial (Center): Millimeter crust       Objective  Left Forehead: Multiple small noninflamed 1 to 2 mm pink crusts.  Historical [facility] dermatology physician assistant of visits her regularly at friend's home and each time freezes a dozen or more spots, several of which are the ones that did not go away and likely need biopsy.   All sun exposed areas plus back examined.  Plus chest arms and lower legs.   Assessment & Plan  Encounter for screening for malignant neoplasm of skin Right Breast  Recheck as needed change  Seborrheic keratosis Mid Back  Okay to leave if stable  Neoplasm of uncertain behavior of skin (4) Mid Forehead  Skin / nail biopsy Type of biopsy: tangential   Informed consent: discussed and consent obtained   Timeout: patient  name, date of birth, surgical site, and procedure verified   Procedure prep:  Patient was prepped and draped in usual sterile fashion (Non sterile) Prep type:  Chlorhexidine Anesthesia: the lesion was anesthetized in a standard fashion   Anesthetic:  1% lidocaine w/ epinephrine 1-100,000 local infiltration Instrument used: flexible razor blade   Outcome: patient tolerated procedure well   Post-procedure details: wound care instructions given    Specimen 1 - Surgical pathology Differential Diagnosis: bcc vs scc  Check Margins: No  Left Neck - Anterior  Skin / nail biopsy Type of biopsy: tangential   Informed consent: discussed and consent obtained   Timeout: patient name, date of birth, surgical site, and procedure verified   Anesthesia: the lesion was anesthetized in a standard fashion   Anesthetic:  1% lidocaine w/ epinephrine 1-100,000 local infiltration Instrument used: flexible razor blade   Hemostasis achieved with: aluminum chloride and electrodesiccation   Outcome: patient tolerated procedure well   Post-procedure details: wound care instructions given    Specimen 2 - Surgical pathology Differential Diagnosis: bcc vs scc  Check Margins: No  Right Chest  Skin / nail biopsy Type of biopsy: tangential   Informed consent: discussed and consent obtained   Timeout: patient name, date of birth, surgical site, and procedure verified   Procedure prep:  Patient was prepped and draped in usual sterile fashion (Non sterile) Prep type:  Chlorhexidine  Anesthesia: the lesion was anesthetized in a standard fashion   Anesthetic:  1% lidocaine w/ epinephrine 1-100,000 local infiltration Instrument used: flexible razor blade   Outcome: patient tolerated procedure well   Post-procedure details: wound care instructions given    Specimen 3 - Surgical pathology Differential Diagnosis: bcc vs scc  Check Margins: No  Chest - Medial (Center)  Skin / nail biopsy Type of biopsy:  tangential   Informed consent: discussed and consent obtained   Timeout: patient name, date of birth, surgical site, and procedure verified   Procedure prep:  Patient was prepped and draped in usual sterile fashion (Non sterile) Prep type:  Chlorhexidine Anesthesia: the lesion was anesthetized in a standard fashion   Anesthetic:  1% lidocaine w/ epinephrine 1-100,000 local infiltration Instrument used: flexible razor blade   Outcome: patient tolerated procedure well   Post-procedure details: wound care instructions given    Specimen 4 - Surgical pathology Differential Diagnosis: bcc vs scc  Check Margins: No  AK (actinic keratosis) Left Forehead  I gently suggested to Stephanie Shea that if she has a small crust that is not bothersome and is historically quite stable, she may in the future refused to allow freezing.

## 2020-02-23 ENCOUNTER — Encounter: Payer: Self-pay | Admitting: Orthopedic Surgery

## 2020-02-23 ENCOUNTER — Other Ambulatory Visit: Payer: Self-pay

## 2020-02-23 ENCOUNTER — Ambulatory Visit (INDEPENDENT_AMBULATORY_CARE_PROVIDER_SITE_OTHER): Payer: Medicare Other | Admitting: Orthopedic Surgery

## 2020-02-23 VITALS — BP 120/70 | HR 57 | Temp 98.1°F | Resp 20 | Ht 65.0 in | Wt 185.0 lb

## 2020-02-23 DIAGNOSIS — H6121 Impacted cerumen, right ear: Secondary | ICD-10-CM | POA: Diagnosis not present

## 2020-02-23 NOTE — Progress Notes (Signed)
Location:   Therapist, nutritional of Service:    Provider:  Windell Moulding, AGNP-C  Mast, [name], NP  Patient Care Team: Mast, [name], NP as PCP - General (Internal Medicine) Leonie Man, MD as PCP - Cardiology (Cardiology) Mast, [name], NP as Nurse Practitioner (Internal Medicine) Lavonna Monarch, MD as Consulting Physician (Dermatology)  Extended Emergency Contact Information Primary Emergency Contact: [place] Mobile Phone: [phone] Relation: Daughter Secondary Emergency Contact: Weeks,Tiffany Mobile Phone: [phone] Relation: Daughter Preferred language: English Interpreter needed? No  Goals of care: Advanced Directive information Advanced Directives 02/23/2020  Does Patient Have a Medical Advance Directive? Yes  Type of Paramedic of [place];Living will;Out of facility DNR (pink MOST or yellow form)  Does patient want to make changes to medical advance directive? No - Patient declined  Copy of [place] in Chart? Yes - validated most recent copy scanned in chart (See row information)  Pre-existing out of facility DNR order (yellow form or pink MOST form) Yellow form placed in chart (order not valid for inpatient use)     Chief Complaint  Patient presents with  . Acute Visit    Right ear feels stopped up and all sounds are like an echo or hollow log    HPI:  [name] seen today for an acute visit for   2 weeks ago, she noticed her hearing in right ear changed. Described as echo or sounds like a tunnel. Dr. Aim is her audiologist, interested in seeing him if hearing issues persist. Denies any pain, drainage. No recent trauma to ear, allergies or colds. Does not use Qtips.   Had shingles shot yesterday, tolerated well, arm is a little sore.    Past Medical History:  Diagnosis Date  . Atherosclerosis of native coronary artery 2001   LAD & Cx disease -- BMS PCI in 2001; CABG 2002.  PCI in 2006  (per-op TAH)  . Atrial fibrillation and flutter ([place]) 04/2019   New diagnosis-major complaint was chest tightness and dyspnea.  . Breast cancer ([place])   . Cervix dysplasia   . GERD (gastroesophageal reflux disease)   . Heart attack ([place]) 2002, 2006   Both events reportedly were perioperatively. She has no recollection. Initial PCI was related to angina symptoms (bilateral arm pain)  . High cholesterol   . Hypertension   . Lumbosacral spinal stenosis   . Osteoarthritis   . Skin cancer   . Sleep apnea    Uses CPAP faithfully   Past Surgical History:  Procedure Laterality Date  . ABDOMINAL HYSTERECTOMY    . bone density    . CARDIAC CATHETERIZATION  2001    Dr. Glade Lloyd [facility]) 2 vessel CAD involving LAD-D1 and distal Cx-OM. Appearance of dilated tapered left main with no significant atherosclerosis on IVUS --  BMS PCI of pLAD ([place] BMS 3.0 mm x 15 mm) & with PTCA of ostial D1, PTCA of dLAD.  BMS PCI pCx [facility] BMS 3.5 mm x 11 mm) - unable to reopen dCx-OM (probable distal Atheroembolic occlusion.  Normal LVEF.  Marland Kitchen COLONOSCOPY    . CORONARY ARTERY BYPASS GRAFT  2002   Unsure of details [facility])  . CORONARY STENT INTERVENTION  2001   ([place]) [place] BMS PCI pLAD (3.0 mm x 15 mm) -PTCA of jailed D1 as well as distal LAD; pCx ([place] BMS 3.5 mm x 11 mm) -unable to open distalCx-OM -thromboembolic  occlusion  . DIAGNOSTIC MAMMOGRAM    . LEFT HEART CATH AND CORS/GRAFTS ANGIOGRAPHY N/A 04/25/2019   Procedure: LEFT HEART CATH AND CORS/GRAFTS ANGIOGRAPHY;  Surgeon: Troy Sine, MD;  Location: [facility] INVASIVE CV LAB;;; ost-prox LCx stent @ OM1 ostium =100% CTO, Ost-prox LAD stent ~30% ISR, Ost D1 CTO, prox RCA ~30%. SCG-OM2 patent with ostial stent 30%, LIMA-LAD patent; CTO of SVG-D1.   Marland Kitchen left knee replacement    . MASTECTOMY    . NM MYOVIEW LTD  12/2014; 2018   Cardiologist (Dr. Kellie Simmering.  [place], [place] ph# [phone]) -->LEXISCAN: Nonischemic, "normal "  . pap smear     . tonsilectomy    . TRANSTHORACIC ECHOCARDIOGRAM  12/2014   Echocardiogram 12/25/14- normal LVEF and systolic function, EF 40-08%, mild grade 1 diastolic dysfunction  . TRANSTHORACIC ECHOCARDIOGRAM  04/24/2019   EF 65 to 70%.  Moderate septal LVH.  GR 1 DD-elevated EDP.  Normal PAP.  Relatively normal valves.  No aortic stenosis.    Allergies  Allergen Reactions  . Codeine Nausea Only    Outpatient Encounter Medications as of 02/23/2020  Medication Sig  . acetaminophen (TYLENOL) 500 MG tablet Take 500 mg by mouth at bedtime as needed (for mild pain and "sleep").   Marland Kitchen amiodarone (PACERONE) 200 MG tablet Take 1 tablet (200 mg total) by mouth daily.  Marland Kitchen ammonium lactate (AMLACTIN) 12 % cream Apply topically as needed for dry skin.  Marland Kitchen apixaban (ELIQUIS) 5 MG TABS tablet Take 1 tablet (5 mg total) by mouth 2 (two) times daily.  Marland Kitchen atorvastatin (LIPITOR) 80 MG tablet TAKE 1 TABLET DAILY  . Cholecalciferol (VITAMIN D3) 50 MCG (2000 UT) TABS Take 2,000 Units by mouth daily after breakfast.  . enalapril (VASOTEC) 10 MG tablet Take 1 tablet (10 mg total) by mouth daily.  . fexofenadine (ALLEGRA) 180 MG tablet Take 180 mg by mouth daily as needed for allergies or rhinitis.   . fluticasone (FLONASE) 50 MCG/ACT nasal spray Place 1 spray into both nostrils daily as needed for allergies or rhinitis.   . metoprolol succinate (TOPROL-XL) 50 MG 24 hr tablet Take 1 tablet (50 mg total) by mouth daily.  . nitroGLYCERIN (NITROSTAT) 0.4 MG SL tablet PLACE 1 TABLET UNDER TONGUE EVERY 5 MINUTES FOR 3 DOSES FOR CHEST PAIN  . omeprazole (PRILOSEC OTC) 20 MG tablet Take 20 mg by mouth daily before breakfast.  . WHEAT DEXTRIN PO Take by mouth. Take 2 teaspoons full in to 8 oz of water or any drink by mouth twice day  . [DISCONTINUED] Wheat Dextrin (BENEFIBER) POWD 2 spoonful mixed in water 3 times a day   No facility-administered encounter medications on file as of 02/23/2020.    Review of Systems  Constitutional:  Negative for activity change, appetite change and fever.  HENT: Positive for hearing loss. Negative for congestion, ear discharge, ear pain and sinus pressure.        Decreased hearing right ear  Respiratory: Negative for cough, shortness of breath and wheezing.   Cardiovascular: Negative for chest pain and leg swelling.  Neurological: Negative for dizziness and light-headedness.  Psychiatric/Behavioral: Negative for confusion and decreased concentration. The patient is not nervous/anxious.     Immunization History  Administered Date(s) Administered  . Influenza, High Dose Seasonal PF 10/21/2016, 09/24/2017, 10/25/2019  . Influenza-Unspecified 01/13/2016  . Moderna Sars-Covid-2 Vaccination 01/16/2019, 02/13/2019  . Pneumococcal-Unspecified 01/13/2016   Pertinent  Health Maintenance Due  Topic Date Due  . OPHTHALMOLOGY EXAM  Never done  .  PNA vac Low Risk Adult (2 of 2 - PCV13) 01/12/2017  . HEMOGLOBIN A1C  12/01/2019  . FOOT EXAM  12/13/2019  . INFLUENZA VACCINE  Completed  . DEXA SCAN  Completed   Fall Risk  05/25/2019 02/03/2019 08/19/2018 05/20/2018 03/12/2017  Falls in the past year? 0 0 0 0 No  Number falls in past yr: 0 0 0 0 -  Injury with Fall? - - 0 0 -   Functional Status Survey:    Vitals:   02/23/20 1605  BP: 120/70  Pulse: (!) 57  Resp: 20  Temp: 98.1 F (36.7 C)  TempSrc: Temporal  SpO2: 93%  Weight: 185 lb (83.9 kg)  Height: 5\' 5"  (1.651 m)   Body mass index is 30.79 kg/m. Physical Exam Vitals reviewed.  Constitutional:      General: She is not in acute distress.    Appearance: Normal appearance.  HENT:     Right Ear: There is impacted cerumen.     Left Ear: There is no impacted cerumen.     Mouth/Throat:     Mouth: Mucous membranes are moist.  Eyes:     General:        Right eye: No discharge.        Left eye: No discharge.  Cardiovascular:     Pulses: Normal pulses.     Heart sounds: Normal heart sounds.  Pulmonary:     Effort: Pulmonary  effort is normal.     Breath sounds: Normal breath sounds. No wheezing.  Neurological:     General: No focal deficit present.     Mental Status: She is alert and oriented to person, place, and time.  Psychiatric:        Mood and Affect: Mood normal.        Behavior: Behavior normal.     Labs reviewed: Recent Labs    04/25/19 0616 05/31/19 0859 08/07/19 1547  NA 140 141 140  K 4.3 4.5 4.4  CL 105 103 101  CO2 26 30 26   GLUCOSE 114* 112* 105*  BUN 13 18 13   CREATININE 0.87 1.03* 0.96  CALCIUM 9.4 10.0 10.1   Recent Labs    04/23/19 2003 05/31/19 0859 08/07/19 1547  AST 22 18 22   ALT 18 16 27   ALKPHOS 46  --  61  BILITOT 1.0 0.6 0.7  PROT 6.7 7.0 7.2  ALBUMIN 3.6  --  4.4   Recent Labs    04/25/19 0616 05/31/19 0859 08/07/19 1547  WBC 7.9 6.0 6.1  NEUTROABS  --  3,048  --   HGB 12.2 13.0 13.7  HCT 37.9 39.0 39.7  MCV 99.7 96.8 95  PLT 200 257 230   Lab Results  Component Value Date   TSH 1.430 08/07/2019   Lab Results  Component Value Date   HGBA1C 5.9 (H) 05/31/2019   Lab Results  Component Value Date   CHOL 193 05/31/2019   HDL 65 05/31/2019   LDLCALC 103 (H) 05/31/2019   TRIG 152 (H) 05/31/2019   CHOLHDL 3.0 05/31/2019    Significant Diagnostic Results in last 30 days:  No results found.  Assessment/Plan 1. Impacted cerumen of right ear - cannot visualize right TM due to wax buildup - recommend debrox 5 gtts to left and right ear QHS x 7 days per month - Ear Lavage - large amount of wax removed after lavage- patient states hearing improved  I provided 15 minutes of non-face-to-face time during this encounter.  Family/ staff Communication: Plan discussed with patient   Labs/tests ordered:  Right ear lavage

## 2020-02-23 NOTE — Patient Instructions (Signed)
Debrox drops- may purchase OTC -apply 5 drops every night for 7 days/ monthly   Earwax Buildup, Adult The ears produce a substance called earwax that helps keep bacteria out of the ear and protects the skin in the ear canal. Occasionally, earwax can build up in the ear and cause discomfort or hearing loss. What are the causes? This condition is caused by a buildup of earwax. Ear canals are self-cleaning. Ear wax is made in the outer part of the ear canal and generally falls out in small amounts over time. When the self-cleaning mechanism is not working, earwax builds up and can cause decreased hearing and discomfort. Attempting to clean ears with cotton swabs can push the earwax deep into the ear canal and cause decreased hearing and pain. What increases the risk? This condition is more likely to develop in people who:  Clean their ears often with cotton swabs.  Pick at their ears.  Use earplugs or in-ear headphones often, or wear hearing aids. The following factors may also make you more likely to develop this condition:  Being female.  Being of older age.  Naturally producing more earwax.  Having narrow ear canals.  Having earwax that is overly thick or sticky.  Having excess hair in the ear canal.  Having eczema.  Being dehydrated. What are the signs or symptoms? Symptoms of this condition include:  Reduced or muffled hearing.  A feeling of fullness in the ear or feeling that the ear is plugged.  Fluid coming from the ear.  Ear pain or an itchy ear.  Ringing in the ear.  Coughing.  Balance problems.  An obvious piece of earwax that can be seen inside the ear canal. How is this diagnosed? This condition may be diagnosed based on:  Your symptoms.  Your medical history.  An ear exam. During the exam, your health care provider will look into your ear with an instrument called an otoscope. You may have tests, including a hearing test. How is this  treated? This condition may be treated by:  Using ear drops to soften the earwax.  Having the earwax removed by a health care provider. The health care provider may: ? Flush the ear with water. ? Use an instrument that has a loop on the end (curette). ? Use a suction device.  Having surgery to remove the wax buildup. This may be done in severe cases. Follow these instructions at home:  Take over-the-counter and prescription medicines only as told by your health care provider.  Do not put any objects, including cotton swabs, into your ear. You can clean the opening of your ear canal with a washcloth or facial tissue.  Follow instructions from your health care provider about cleaning your ears. Do not overclean your ears.  Drink enough fluid to keep your urine pale yellow. This will help to thin the earwax.  Keep all follow-up visits as told. If earwax builds up in your ears often or if you use hearing aids, consider seeing your health care provider for routine, preventive ear cleanings. Ask your health care provider how often you should schedule your cleanings.  If you have hearing aids, clean them according to instructions from the manufacturer and your health care provider.   Contact a health care provider if:  You have ear pain.  You develop a fever.  You have pus or other fluid coming from your ear.  You have hearing loss.  You have ringing in your ears that does not  go away.  You feel like the room is spinning (vertigo).  Your symptoms do not improve with treatment. Get help right away if:  You have bleeding from the affected ear.  You have severe ear pain. Summary  Earwax can build up in the ear and cause discomfort or hearing loss.  The most common symptoms of this condition include reduced or muffled hearing, a feeling of fullness in the ear, or feeling that the ear is plugged.  This condition may be diagnosed based on your symptoms, your medical history, and  an ear exam.  This condition may be treated by using ear drops to soften the earwax or by having the earwax removed by a health care provider.  Do not put any objects, including cotton swabs, into your ear. You can clean the opening of your ear canal with a washcloth or facial tissue. This information is not intended to replace advice given to you by your health care provider. Make sure you discuss any questions you have with your health care provider. Document Revised: 04/18/2019 Document Reviewed: 04/18/2019 Elsevier Patient Education  [place].

## 2020-02-27 ENCOUNTER — Ambulatory Visit: Payer: Medicare Other | Admitting: Cardiology

## 2020-03-07 DIAGNOSIS — Z1231 Encounter for screening mammogram for malignant neoplasm of breast: Secondary | ICD-10-CM | POA: Diagnosis not present

## 2020-03-08 ENCOUNTER — Encounter: Payer: Self-pay | Admitting: Cardiology

## 2020-03-08 ENCOUNTER — Ambulatory Visit (INDEPENDENT_AMBULATORY_CARE_PROVIDER_SITE_OTHER): Payer: Medicare Other | Admitting: Cardiology

## 2020-03-08 ENCOUNTER — Other Ambulatory Visit: Payer: Self-pay

## 2020-03-08 VITALS — BP 138/68 | HR 50 | Ht 67.0 in | Wt 182.0 lb

## 2020-03-08 DIAGNOSIS — I480 Paroxysmal atrial fibrillation: Secondary | ICD-10-CM | POA: Diagnosis not present

## 2020-03-08 DIAGNOSIS — I249 Acute ischemic heart disease, unspecified: Secondary | ICD-10-CM | POA: Diagnosis not present

## 2020-03-08 DIAGNOSIS — Z79899 Other long term (current) drug therapy: Secondary | ICD-10-CM | POA: Diagnosis not present

## 2020-03-08 DIAGNOSIS — I25709 Atherosclerosis of coronary artery bypass graft(s), unspecified, with unspecified angina pectoris: Secondary | ICD-10-CM | POA: Diagnosis not present

## 2020-03-08 DIAGNOSIS — I25119 Atherosclerotic heart disease of native coronary artery with unspecified angina pectoris: Secondary | ICD-10-CM

## 2020-03-08 DIAGNOSIS — I10 Essential (primary) hypertension: Secondary | ICD-10-CM

## 2020-03-08 DIAGNOSIS — E785 Hyperlipidemia, unspecified: Secondary | ICD-10-CM

## 2020-03-08 DIAGNOSIS — I5032 Chronic diastolic (congestive) heart failure: Secondary | ICD-10-CM | POA: Diagnosis not present

## 2020-03-08 DIAGNOSIS — G4733 Obstructive sleep apnea (adult) (pediatric): Secondary | ICD-10-CM

## 2020-03-08 DIAGNOSIS — N183 Chronic kidney disease, stage 3 unspecified: Secondary | ICD-10-CM | POA: Diagnosis not present

## 2020-03-08 DIAGNOSIS — Z9989 Dependence on other enabling machines and devices: Secondary | ICD-10-CM

## 2020-03-08 MED ORDER — AMIODARONE HCL 200 MG PO TABS: 100.0000 mg | ORAL_TABLET | Freq: Every day | ORAL | 3 refills | Status: DC

## 2020-03-08 NOTE — Progress Notes (Signed)
Primary Care Provider: Mast, [name], NP Cardiologist: Glenetta Hew, MD Electrophysiologist: None  Clinic Note: Chief Complaint  Patient presents with  . Follow-up    Doing well.  No major complaints.  . Coronary Artery Disease    No signs or symptoms of angina  . Atrial Fibrillation    As far she can tell, no breakthrough spells   ===================================  ASSESSMENT/PLAN   Problem List Items Addressed This Visit    ACS (acute coronary syndrome) [facility])    She had an ACS presentation in 2021 which is probably more actually demand ischemia from A. fib with existing disease.  In the past she has had presumable myocardial infarction is with PCI done but none recently.      Relevant Medications   amiodarone (PACERONE) 200 MG tablet   CKD (chronic kidney disease) stage 3, GFR 30-59 ml/min (HCC)   Relevant Orders   CBC   Sedimentation rate   Lipid panel   Comprehensive metabolic panel   On amiodarone therapy    Multiple interactions with amiodarone.  I spent some time talking to her about the different potential toxic side effects including liver, lung, thyroid, eyes etc. as such, we need to monitor for amiodarone toxicity with TSH, LFTs, as well as CRP/sed rate to assess for lung toxicity.        Relevant Orders   CBC   C-reactive protein   TSH   Sedimentation rate   Lipid panel   Comprehensive metabolic panel   Coronary artery disease involving coronary bypass graft of native heart with angina pectoris (HCC) (Chronic)    Occluded vein graft to diagonal branch which is also occluded.  No PCI option.  No further angina. 30% in-stent restenosis in SVG-OM but widely patent LIMA.      Relevant Medications   amiodarone (PACERONE) 200 MG tablet   Other Relevant Orders   CBC   C-reactive protein   TSH   Sedimentation rate   Lipid panel   Comprehensive metabolic panel   Paroxysmal atrial fibrillation/flutter (HCC) (Chronic)    First diagnosed when she was  admitted with what sounds like ACS chest discomfort.  She was in the PCPs office in May of last year and had bradycardia with hypotension.  As such, beta-blocker was reduced, and she was started on amiodarone. Last visit we got the beta-blocker dose because of bradycardia, she remains somewhat bradycardic today.  She has CHA2DS2-VASc score of 5.  Plan:   Continue current dose of Toprol and reduce amiodarone to 100 mg daily.  If she has breakthrough, she will take to 1 mg twice daily.  Continue Eliquis.  No bleeding issues.      Relevant Medications   amiodarone (PACERONE) 200 MG tablet   Other Relevant Orders   CBC   C-reactive protein   TSH   Sedimentation rate   Lipid panel   Comprehensive metabolic panel   Coronary artery disease involving native coronary artery of native heart with angina pectoris ([place]) - Primary (Chronic)    Basically the native LCx and D1 are occluded along with OM1, but the graft to the more distal OM is patent.  Her symptoms of chest pain and dyspnea were associate with A. fib RVR.  Minimal troponin elevations would go along with demand ischemia. No longer having any angina or significant exertional dyspnea.  Plan:  Not on aspirin or Plavix because of Eliquis for A. fib.  On stable dose of Toprol.  We reduced the  dosing to 50 mg last visit because of bradycardia, this time we will reduce the amiodarone dose to 100 daily.  Continue enalapril  Continue high-dose atorvastatin for now, but we need to reassess labs in order to determine if many medication changes are needed.      Relevant Medications   amiodarone (PACERONE) 200 MG tablet   Other Relevant Orders   CBC   C-reactive protein   TSH   Sedimentation rate   Lipid panel   Comprehensive metabolic panel   Essential hypertension (Chronic)    BP still borderline high today.  I want her to continue to monitor her pressures at home.  Normal little leery orthostatic dizziness so I will not push the  dose higher.  The more I get to know her, I feel more comfortable potentially adjusting dose.  Plan: Continue enalapril 10 mg daily and Toprol 50 mg daily.  Not currently on diuretic, however if we needed something, would probably consider spironolactone.  Otherwise, we are more likely to tolerate permissive hypertension as opposed to hypotension.      Relevant Medications   amiodarone (PACERONE) 200 MG tablet   Other Relevant Orders   Comprehensive metabolic panel   Hyperlipidemia (Chronic)    Have not seen labs drawn since she is was on statin.  Plan: Check lipid panel and LFTs. For now continue atorvastatin 80 mg daily.  I suspect that we may be able to adjust if lipids look better.      Relevant Medications   amiodarone (PACERONE) 200 MG tablet   Other Relevant Orders   CBC   C-reactive protein   TSH   Sedimentation rate   Lipid panel   Comprehensive metabolic panel   (HFpEF) heart failure with preserved ejection fraction (HCC) (Chronic)    Echo showed normal EF but likely diastolic dysfunction in the time of her A. fib evaluation.  She had elevated EDP by both cath and echo.  Grade 1 diastolic dysfunction noted.  I suspect that she just is intolerant of A. fib.  In the absence of atrial fibrillation, she does not have any real CHF symptoms of PND or orthopnea.  No real edema.  Just some exertional dyspnea.  Euvolemic on exam.  No requirement for diuretic (would probably only do as needed)  Plan: Continue metoprolol succinate, enalapril for afterload reduction along with amiodarone to try to maintain stable sinus rhythm      Relevant Medications   amiodarone (PACERONE) 200 MG tablet   OSA on CPAP (Chronic)    Doing well with CPAP.  Tolerating well.      Relevant Orders   CBC   Comprehensive metabolic panel      ===================================  HPI:    Stephanie Shea is a 87 y.o. female with a Extensive Cardiac History noted below who presents today for  50-month follow-up.   CAD history dates back to 2001 -- progressive exertional angina (bilateral arm aching. 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  2001 -- ([place], Dr. Glade Lloyd) Cath-PCI of pLAD &PTCA of ostial D1 (jailed), dLAD PTCA. PCI of pCx, but unable to open dCx-OM. IVIS of left main  ? Peri-op MI 2002 --> CABG [facility]) -> has had stent placed in the ostial SVG-OM  2006- [facility], peri-op MI for TAH -- PCI  Last STRESS TEST in 2018 by former Cardiologist (Dr. Kellie Simmering. [place], [place] ph# [phone]) - no further action  Moved to [place]. Initially  seen by me in January 2019. Stable.  04/2019 - Admitted for angina (required 2nd NTG)-> cath;  Occluded SVG-Diag  TTE 04/24/2019: EF 65 to 70%.  Moderate septal LVH.  GR 1 DD-elevated EDP.  Normal PAP.  Relatively normal valves.  No aortic stenosis.  [facility] 04/25/2019: ost-prox LCx stent @ OM1 ostium =100% CTO, Ost-prox LAD stent ~30% ISR, Ost D1 CTO, prox RCA ~30%. SCG-OM2 patent with ostial stent 30%, LIMA-LAD patent; CTO of SVG-D1. => Newly occluded SVG-diagonal, no invasive options.  PAROXYSMAL ATRIAL FIBRILLATION/FLUTTER (new diagnosis in 2021); CHA2DS2-VASc score 5; currently on amiodarone and Eliquis.  Mild HFpEF-exacerbated by A. fib.  No diuretic.  OSA on CPAP  HTN, HLD  Marissa Weaver was last seen on August 11, 2019 as her first visit with me since her April hospitalization with cardiac cath.  She was accompanied by her daughter, doing very well.  No major issues.  Maybe little bit of left leg swelling but nothing significant.  No associated PND or orthopnea.  No chest pain or pressure and no irregular heartbeats.  She is gets worn out a little earlier than she used to.  Now getting worn out by 9 PM, she does has to go to bed.  This is in comparison to the last few years when she was able step until midnight.  Maybe little less get up and go.  Walking limited by arthritis.  The  hope was to avoid long-term amiodarone.  However we reduced her beta-blocker to 50 mg because of bradycardia and increase the ACE inhibitor.  Continue Eliquis.  Lipids ordered  Recent Hospitalizations: None  Reviewed  CV studies:    The following studies were reviewed today: (if available, images/films reviewed: From Epic Chart or Care Everywhere) . None:   Interval History:   Stephanie Shea presents here today-as usual accompanied by her daughter Johnny Bridge (Daughter) [phone]).  She has been taking it easy as far sternal overexert, but she is does every exercise class that is offered at the facility.  She does at least once a day 4 days a week.  She also tries to walk as much as she can, just troubled by her hip pain.  She may be is a little short of breath of her hip starts hurting her, but otherwise does well.  She does get pretty good sleep when she sleeps with her CPAP.  She did not mention as much about lack of energy today.  Seems that energy is improved.  CV Review of Symptoms (Summary): no chest pain or dyspnea on exertion positive for - Mild exercise intolerance, more limited by arthritis pains.  Energy level improved but still down negative for - edema, irregular heartbeat, orthopnea, palpitations, paroxysmal nocturnal dyspnea, rapid heart rate, shortness of breath or Lightheadedness or dizziness, syncope/near syncope or TIA/amaurosis fugax, claudication positive for - Feeling tired, mild lack of energy negative for - chest pain, dyspnea on exertion, irregular heartbeat, murmur, orthopnea, palpitations, paroxysmal nocturnal dyspnea, rapid heart rate, shortness of breath or Lightheadedness or dizziness, syncope/near syncope, TIA/amaurosis fugax or claudication.  The patient does not have symptoms concerning for COVID-19 infection (fever, chills, cough, or new shortness of breath).   REVIEWED OF SYSTEMS   Review of Systems  Constitutional: Positive for malaise/fatigue  (Energy level seems better.  She did not mention it much today.). Negative for weight loss.  HENT: Negative for congestion and nosebleeds.   Respiratory: Negative for cough and shortness of breath.   Cardiovascular: Positive for leg  swelling (She has some swelling of the left ankle only, not edema.).  Gastrointestinal: Negative for blood in stool, constipation and melena.  Genitourinary: Negative for flank pain and hematuria.  Musculoskeletal: Positive for back pain, joint pain (Mostly hips, also knees) and myalgias.  Neurological: Positive for weakness (Legs are little bit weak.). Negative for dizziness (Only if she stands up fast) and focal weakness.  Psychiatric/Behavioral: Positive for memory loss. Negative for depression. The patient is not nervous/anxious and does not have insomnia.    I have reviewed and (if needed) personally updated the patient's problem list, medications, allergies, past medical and surgical history, social and family history.   PAST MEDICAL HISTORY   Past Medical History:  Diagnosis Date  . Atherosclerosis of native coronary artery 2001   LAD & Cx disease -- BMS PCI in 2001; CABG 2002.  PCI in 2006 (per-op TAH)  . Atrial fibrillation and flutter ([place]) 04/2019   New diagnosis-major complaint was chest tightness and dyspnea.  . Breast cancer ([place])   . Cervix dysplasia   . GERD (gastroesophageal reflux disease)   . Heart attack ([place]) 2002, 2006   Both events reportedly were perioperatively. She has no recollection. Initial PCI was related to angina symptoms (bilateral arm pain)  . High cholesterol   . Hypertension   . Lumbosacral spinal stenosis   . Osteoarthritis   . Skin cancer   . Sleep apnea    Uses CPAP faithfully    PAST SURGICAL HISTORY   Past Surgical History:  Procedure Laterality Date  . ABDOMINAL HYSTERECTOMY    . bone density    . CARDIAC CATHETERIZATION  2001    Dr. Glade [facility]) 2 vessel CAD involving LAD-D1 and distal Cx-OM.  Appearance of dilated tapered left main with no significant atherosclerosis on IVUS --  BMS PCI of pLAD ([facility] BMS 3.0 mm x 15 mm) & with PTCA of ostial D1, PTCA of dLAD.  BMS PCI pCx [facility] BMS 3.5 mm x 11 mm) - unable to reopen dCx-OM (probable distal Atheroembolic occlusion.  Normal LVEF.  Marland Kitchen COLONOSCOPY    . CORONARY ARTERY BYPASS GRAFT  2002   Unsure of details [facility])  . CORONARY STENT INTERVENTION  2001   ([place]) [facility] BMS PCI pLAD (3.0 mm x 15 mm) -PTCA of jailed D1 as well as distal LAD; pCx ([facility] BMS 3.5 mm x 11 mm) -unable to open distalCx-OM -thromboembolic occlusion  . DIAGNOSTIC MAMMOGRAM    . LEFT HEART CATH AND CORS/GRAFTS ANGIOGRAPHY N/A 04/25/2019   Procedure: LEFT HEART CATH AND CORS/GRAFTS ANGIOGRAPHY;  Surgeon: Troy Sine, MD;  Location: [facility] IN[facility] LAB;;; ost-prox LCx stent @ OM1 ostium =100% CTO, Ost-prox LAD stent ~30% ISR, Ost D1 CTO, prox RCA ~30%. SCG-OM2 patent with ostial stent 30%, LIMA-LAD patent; CTO of SVG-D1.   Marland Kitchen left knee replacement    . MASTECTOMY    . NM MYOVIEW LTD  12/2014; 2018   Cardiologist (Dr. Kellie Simmering.  [place], [place] ph# [phone]) -->LEXISCAN: Nonischemic, "normal "  . pap smear    . tonsilectomy    . TRANSTHORACIC ECHOCARDIOGRAM  12/2014   Echocardiogram 12/25/14- normal LVEF and systolic function, EF 38-75%, mild grade 1 diastolic dysfunction  . TRANSTHORACIC ECHOCARDIOGRAM  04/24/2019   EF 65 to 70%.  Moderate septal LVH.  GR 1 DD-elevated EDP.  Normal PAP.  Relatively normal valves.  No aortic stenosis.   Cardiac Cath 04/25/2019:ost-prox LCx stent @ OM1 ostium =100% CTO,  Ost-prox LAD stent ~30% ISR, Ost D1 CTO, prox RCA ~30%. SCG-OM2 patent with ostial stent 30%, LIMA-LAD patent; CTO of SVG-D1.    Immunization History  Administered Date(s) Administered  . Influenza, High Dose Seasonal PF 10/21/2016, 09/24/2017, 10/25/2019  . Influenza-Unspecified 01/13/2016  . Moderna Sars-Covid-2 Vaccination 01/16/2019,  02/13/2019  . Pneumococcal-Unspecified 01/13/2016    MEDICATIONS/ALLERGIES   No outpatient medications have been marked as taking for the 03/08/20 encounter (Office Visit) with Leonie Man, MD.    Allergies  Allergen Reactions  . Codeine Nausea Only    SOCIAL HISTORY/FAMILY HISTORY   Reviewed in Epic:  Pertinent findings:  Social History   Tobacco Use  . Smoking status: Never Smoker  . Smokeless tobacco: Never Used  Vaping Use  . Vaping Use: Never used  Substance Use Topics  . Alcohol use: No  . Drug use: No   Social History   Social History Narrative   Recently moved back to [place] (lives alone in a [place] apartment at [facility])   Do you drink/eat things with caffeine? 2 cups of coffee every day   What year were you married? [place] - twice widowed   No pets.      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 Has POA      Primary Emergency Contact: WUJWJX,BJYN L   Address: [address],  [place] [address]   Home Phone: [phone]    [place], EKG, labs   Wt Readings from Last 3 Encounters:  03/08/20 182 lb (82.6 kg)  02/23/20 185 lb (83.9 kg)  08/11/19 185 lb (83.9 kg)    Physical Exam: BP 138/68 (BP Location: Left Arm, Patient Position: Sitting, Cuff Size: Large)   Pulse (!) 50   Ht 5\' 7"  (1.702 m)   Wt 182 lb (82.6 kg)   BMI 28.51 kg/m  Physical Exam Constitutional:      General: She is not in acute distress.    Appearance: Normal appearance. She is normal weight. She is not ill-appearing, toxic-appearing or diaphoretic.     Comments: Healthy-appearing for age.  HENT:     Head: Normocephalic and atraumatic.  Neck:     Vascular: No carotid bruit.  Cardiovascular:     Rate and Rhythm: Regular rhythm. Bradycardia present.     Pulses: Normal pulses.     Heart sounds: Normal heart sounds. No friction rub. No gallop  (Soft 1/6C-D SEM at RUSB--neck).   Pulmonary:     Effort: Pulmonary effort is normal. No respiratory distress.     Breath sounds: Normal breath sounds.  Chest:     Chest wall: No tenderness.  Musculoskeletal:        General: Normal range of motion.     Cervical back: Normal range of motion and neck supple.     Comments: Wound in a wheelchair, mostly because of ease of getting around.  She does walk with a walker.  Skin:    General: Skin is warm and dry.  Neurological:     General: No focal deficit present.     Mental Status: She is alert and oriented to person, place, and time. Mental status is at baseline.     Motor: No weakness.     Gait: Gait normal.  Psychiatric:        Mood and Affect:  Mood normal.        Behavior: Behavior normal.        Thought Content: Thought content normal.        Judgment: Judgment normal.     Adult ECG Report None  Recent Labs: Reviewed.  Were not checked when I saw her last time. Lab Results  Component Value Date   CHOL 193 05/31/2019   HDL 65 05/31/2019   LDLCALC 103 (H) 05/31/2019   TRIG 152 (H) 05/31/2019   CHOLHDL 3.0 05/31/2019   Lab Results  Component Value Date   CREATININE 0.96 08/07/2019   BUN 13 08/07/2019   NA 140 08/07/2019   K 4.4 08/07/2019   CL 101 08/07/2019   CO2 26 08/07/2019   CBC Latest Ref Rng & Units 08/07/2019 05/31/2019 04/25/2019  WBC 3.4 - 10.8 x10E3/uL 6.1 6.0 7.9  Hemoglobin 11.1 - 15.9 g/dL 13.7 13.0 12.2  Hematocrit 34.0 - 46.6 % 39.7 39.0 37.9  Platelets 150 - 450 x10E3/uL 230 257 200    Lab Results  Component Value Date   TSH 1.430 08/07/2019   ==================================================  COVID-19 Education: The signs and symptoms of COVID-19 were discussed with the patient and how to seek care for testing (follow up with PCP or arrange E-visit).   The importance of social distancing and COVID-19 vaccination was discussed today. The patient is practicing social distancing & Masking.   I  spent a total of 59minutes with the patient spent in direct patient consultation.  Additional time spent with chart review  / charting (studies, outside notes, etc): 66min Total Time: 38min   Current medicines are reviewed at length with the patient today.  (+/- concerns) N/A  This visit occurred during the SARS-CoV-2 public health emergency.  Safety protocols were in place, including screening questions prior to the visit, additional usage of staff PPE, and extensive cleaning of exam room while observing appropriate contact time as indicated for disinfecting solutions.  Notice: This dictation was prepared with Dragon dictation along with smaller phrase technology. Any transcriptional errors that result from this process are unintentional and may not be corrected upon review.  Patient Instructions / Medication Changes & Studies & Tests Ordered   Patient Instructions  Medication Instructions:  DECREASE AMIODARONE TO 100 MG DAILY  ( 1/2 TABLET OF 200 MG TABLET)  *If you need a refill on your cardiac medications before your next appointment, please call your pharmacy*   Lab Work: Fasting- LIPID TSH CMP  CBC SED RATE  CRP If you have labs (blood work) drawn today and your tests are completely normal, you will receive your results only by: Marland Kitchen MyChart Message (if you have MyChart) OR . A paper copy in the mail If you have any lab test that is abnormal or we need to change your treatment, we will call you to review the results.   Testing/Procedures: NOT NEEDED   Follow-Up: At [facility], you and your health needs are our priority.  As part of our continuing mission to provide you with exceptional heart care, we have created designated Provider Care Teams.  These Care Teams include your primary Cardiologist (physician) and Advanced Practice Providers (APPs -  Physician Assistants and Nurse Practitioners) who all work together to provide you with the care you need, when you need  it.     Your next appointment:   6 month(s)  The format for your next appointment:   In Person  Provider:   Glenetta Hew, MD  Other Instructions   Studies Ordered:   Orders Placed This Encounter  Procedures  . CBC  . C-reactive protein  . TSH  . Sedimentation rate  . Lipid panel  . Comprehensive metabolic panel     Glenetta Hew, M.D., M.S. Interventional Cardiologist   Pager # [phone] Phone # [phone] [address] [place], [place] [address]   Thank you for choosing Heartcare at [facility]!!

## 2020-03-08 NOTE — Patient Instructions (Addendum)
Medication Instructions:  DECREASE AMIODARONE TO 100 MG DAILY  ( 1/2 TABLET OF 200 MG TABLET)  *If you need a refill on your cardiac medications before your next appointment, please call your pharmacy*   Lab Work: Fasting- LIPID TSH CMP  CBC SED RATE  CRP If you have labs (blood work) drawn today and your tests are completely normal, you will receive your results only by: Marland Kitchen MyChart Message (if you have MyChart) OR . A paper copy in the mail If you have any lab test that is abnormal or we need to change your treatment, we will call you to review the results.   Testing/Procedures: NOT NEEDED   Follow-Up: At [facility], you and your health needs are our priority.  As part of our continuing mission to provide you with exceptional heart care, we have created designated Provider Care Teams.  These Care Teams include your primary Cardiologist (physician) and Advanced Practice Providers (APPs -  Physician Assistants and Nurse Practitioners) who all work together to provide you with the care you need, when you need it.     Your next appointment:   6 month(s)  The format for your next appointment:   In Person  Provider:   Glenetta Hew, MD   Other Instructions

## 2020-03-14 DIAGNOSIS — G4733 Obstructive sleep apnea (adult) (pediatric): Secondary | ICD-10-CM | POA: Diagnosis not present

## 2020-03-31 ENCOUNTER — Encounter: Payer: Self-pay | Admitting: Cardiology

## 2020-03-31 NOTE — Assessment & Plan Note (Signed)
Echo showed normal EF but likely diastolic dysfunction in the time of her A. fib evaluation.  She had elevated EDP by both cath and echo.  Grade 1 diastolic dysfunction noted.  I suspect that she just is intolerant of A. fib.  In the absence of atrial fibrillation, she does not have any real CHF symptoms of PND or orthopnea.  No real edema.  Just some exertional dyspnea.  Euvolemic on exam.  No requirement for diuretic (would probably only do as needed)  Plan: Continue metoprolol succinate, enalapril for afterload reduction along with amiodarone to try to maintain stable sinus rhythm

## 2020-03-31 NOTE — Assessment & Plan Note (Signed)
BP still borderline high today.  I want her to continue to monitor her pressures at home.  Normal little leery orthostatic dizziness so I will not push the dose higher.  The more I get to know her, I feel more comfortable potentially adjusting dose.  Plan: Continue enalapril 10 mg daily and Toprol 50 mg daily.  Not currently on diuretic, however if we needed something, would probably consider spironolactone.  Otherwise, we are more likely to tolerate permissive hypertension as opposed to hypotension.

## 2020-03-31 NOTE — Assessment & Plan Note (Signed)
Doing well with CPAP.  Tolerating well.

## 2020-03-31 NOTE — Assessment & Plan Note (Signed)
Basically the native LCx and D1 are occluded along with OM1, but the graft to the more distal OM is patent.  Her symptoms of chest pain and dyspnea were associate with A. fib RVR.  Minimal troponin elevations would go along with demand ischemia. No longer having any angina or significant exertional dyspnea.  Plan:  Not on aspirin or Plavix because of Eliquis for A. fib.  On stable dose of Toprol.  We reduced the dosing to 50 mg last visit because of bradycardia, this time we will reduce the amiodarone dose to 100 daily.  Continue enalapril  Continue high-dose atorvastatin for now, but we need to reassess labs in order to determine if many medication changes are needed.

## 2020-03-31 NOTE — Assessment & Plan Note (Signed)
Have not seen labs drawn since she is was on statin.  Plan: Check lipid panel and LFTs. For now continue atorvastatin 80 mg daily.  I suspect that we may be able to adjust if lipids look better.

## 2020-03-31 NOTE — Assessment & Plan Note (Signed)
Occluded vein graft to diagonal branch which is also occluded.  No PCI option.  No further angina. 30% in-stent restenosis in SVG-OM but widely patent LIMA.

## 2020-03-31 NOTE — Assessment & Plan Note (Signed)
Multiple interactions with amiodarone.  I spent some time talking to her about the different potential toxic side effects including liver, lung, thyroid, eyes etc. as such, we need to monitor for amiodarone toxicity with TSH, LFTs, as well as CRP/sed rate to assess for lung toxicity.

## 2020-03-31 NOTE — Assessment & Plan Note (Signed)
She had an ACS presentation in 2021 which is probably more actually demand ischemia from A. fib with existing disease.  In the past she has had presumable myocardial infarction is with PCI done but none recently.

## 2020-03-31 NOTE — Assessment & Plan Note (Signed)
First diagnosed when she was admitted with what sounds like ACS chest discomfort.  She was in the PCPs office in May of last year and had bradycardia with hypotension.  As such, beta-blocker was reduced, and she was started on amiodarone. Last visit we got the beta-blocker dose because of bradycardia, she remains somewhat bradycardic today.  She has CHA2DS2-VASc score of 5.  Plan:   Continue current dose of Toprol and reduce amiodarone to 100 mg daily.  If she has breakthrough, she will take to 1 mg twice daily.  Continue Eliquis.  No bleeding issues.

## 2020-04-05 DIAGNOSIS — I25119 Atherosclerotic heart disease of native coronary artery with unspecified angina pectoris: Secondary | ICD-10-CM | POA: Diagnosis not present

## 2020-04-05 DIAGNOSIS — Z9989 Dependence on other enabling machines and devices: Secondary | ICD-10-CM | POA: Diagnosis not present

## 2020-04-05 DIAGNOSIS — G4733 Obstructive sleep apnea (adult) (pediatric): Secondary | ICD-10-CM | POA: Diagnosis not present

## 2020-04-05 DIAGNOSIS — N183 Chronic kidney disease, stage 3 unspecified: Secondary | ICD-10-CM | POA: Diagnosis not present

## 2020-04-05 DIAGNOSIS — I10 Essential (primary) hypertension: Secondary | ICD-10-CM | POA: Diagnosis not present

## 2020-04-05 DIAGNOSIS — Z79899 Other long term (current) drug therapy: Secondary | ICD-10-CM | POA: Diagnosis not present

## 2020-04-05 DIAGNOSIS — E785 Hyperlipidemia, unspecified: Secondary | ICD-10-CM | POA: Diagnosis not present

## 2020-04-05 DIAGNOSIS — I480 Paroxysmal atrial fibrillation: Secondary | ICD-10-CM | POA: Diagnosis not present

## 2020-04-05 DIAGNOSIS — I25709 Atherosclerosis of coronary artery bypass graft(s), unspecified, with unspecified angina pectoris: Secondary | ICD-10-CM | POA: Diagnosis not present

## 2020-04-06 LAB — COMPREHENSIVE METABOLIC PANEL
ALT: 40 IU/L — ABNORMAL HIGH (ref 0–32)
AST: 31 IU/L (ref 0–40)
Albumin/Globulin Ratio: 1.4 (ref 1.2–2.2)
Albumin: 4.4 g/dL (ref 3.6–4.6)
Alkaline Phosphatase: 54 IU/L (ref 44–121)
BUN/Creatinine Ratio: 14 (ref 12–28)
BUN: 15 mg/dL (ref 8–27)
Bilirubin Total: 1 mg/dL (ref 0.0–1.2)
CO2: 24 mmol/L (ref 20–29)
Calcium: 10 mg/dL (ref 8.7–10.3)
Chloride: 101 mmol/L (ref 96–106)
Creatinine, Ser: 1.06 mg/dL — ABNORMAL HIGH (ref 0.57–1.00)
Globulin, Total: 3.1 g/dL (ref 1.5–4.5)
Glucose: 101 mg/dL — ABNORMAL HIGH (ref 65–99)
Potassium: 4.5 mmol/L (ref 3.5–5.2)
Sodium: 142 mmol/L (ref 134–144)
Total Protein: 7.5 g/dL (ref 6.0–8.5)
eGFR: 52 mL/min/{1.73_m2} — ABNORMAL LOW (ref >59)

## 2020-04-06 LAB — CBC
Hematocrit: 39.9 % (ref 34.0–46.6)
Hemoglobin: 13.3 g/dL (ref 11.1–15.9)
MCH: 32.8 pg (ref 26.6–33.0)
MCHC: 33.3 g/dL (ref 31.5–35.7)
MCV: 98 fL — ABNORMAL HIGH (ref 79–97)
Platelets: 215 10*3/uL (ref 150–450)
RBC: 4.06 x10E6/uL (ref 3.77–5.28)
RDW: 12.6 % (ref 11.7–15.4)
WBC: 5.4 10*3/uL (ref 3.4–10.8)

## 2020-04-06 LAB — SPECIMEN STATUS REPORT

## 2020-04-06 LAB — LIPID PANEL
Chol/HDL Ratio: 2.4 ratio (ref 0.0–4.4)
Cholesterol, Total: 169 mg/dL (ref 100–199)
HDL: 70 mg/dL (ref >39)
LDL Chol Calc (NIH): 74 mg/dL (ref 0–99)
Triglycerides: 150 mg/dL — ABNORMAL HIGH (ref 0–149)
VLDL Cholesterol Cal: 25 mg/dL (ref 5–40)

## 2020-04-06 LAB — SEDIMENTATION RATE: Sed Rate: 14 mm/hr (ref 0–40)

## 2020-04-06 LAB — TSH: TSH: 1.51 u[IU]/mL (ref 0.450–4.500)

## 2020-04-06 LAB — C-REACTIVE PROTEIN: CRP: <1 mg/L (ref 0–10)

## 2020-04-18 ENCOUNTER — Ambulatory Visit (INDEPENDENT_AMBULATORY_CARE_PROVIDER_SITE_OTHER): Payer: Medicare Other | Admitting: Dermatology

## 2020-04-18 ENCOUNTER — Other Ambulatory Visit: Payer: Self-pay

## 2020-04-18 ENCOUNTER — Encounter: Payer: Self-pay | Admitting: Dermatology

## 2020-04-18 DIAGNOSIS — C4442 Squamous cell carcinoma of skin of scalp and neck: Secondary | ICD-10-CM

## 2020-04-18 DIAGNOSIS — C4492 Squamous cell carcinoma of skin, unspecified: Secondary | ICD-10-CM

## 2020-04-18 NOTE — Patient Instructions (Signed)

## 2020-04-22 ENCOUNTER — Other Ambulatory Visit: Payer: Self-pay

## 2020-04-22 MED ORDER — ELIQUIS 5 MG PO TABS: 5.0000 mg | ORAL_TABLET | Freq: Two times a day (BID) | ORAL | 1 refills | Status: DC

## 2020-04-22 NOTE — Telephone Encounter (Signed)
Prescription refill request for Eliquis received. Indication:atrial fib Last office visit:[facility]  2/22 Scr:1.0 3/22 Age: 87 Weight:82.6 kg  Prescription refilled

## 2020-04-29 ENCOUNTER — Encounter: Payer: Self-pay | Admitting: Dermatology

## 2020-04-29 NOTE — Progress Notes (Signed)
   Follow-Up Visit   Subjective  Stephanie Shea is a 87 y.o. female who presents for the following: Procedure (Here for treatment left neck-anterior- cis x 1).  CIS Location: Left neck Duration:  Quality:  Associated Signs/Symptoms: Modifying Factors:  Severity:  Timing: Context: For treatment  Objective  Well appearing patient in no apparent distress; mood and affect are within normal limits. Objective  Left Neck- anterior: Biopsy site identified by nurse and me.    A focused examination was performed including Head and neck.. Relevant physical exam findings are noted in the Assessment and Plan.   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  Destruction of lesion Complexity: simple   Destruction method: electrodesiccation and curettage   Informed consent: discussed and consent obtained   Timeout:  patient name, date of birth, surgical site, and procedure verified Anesthesia: the lesion was anesthetized in a standard fashion   Anesthetic:  1% lidocaine w/ epinephrine 1-100,000 local infiltration Curettage performed in three different directions: Yes   Curettage cycles:  3 Lesion length (cm):  1.6 Lesion width (cm):  1.6 Margin per side (cm):  0 Final wound size (cm):  1.6 Hemostasis achieved with:  ferric subsulfate Outcome: patient tolerated procedure well with no complications   Additional details:  Wound innoculated with 5 fluorouracil solution.      I, Lavonna Monarch, MD, have reviewed all documentation for this visit.  The documentation on 04/29/20 for the exam, diagnosis, procedures, and orders are all accurate and complete.

## 2020-05-03 ENCOUNTER — Other Ambulatory Visit: Payer: Self-pay | Admitting: Cardiology

## 2020-05-15 DIAGNOSIS — M25552 Pain in left hip: Secondary | ICD-10-CM | POA: Diagnosis not present

## 2020-05-15 DIAGNOSIS — M25562 Pain in left knee: Secondary | ICD-10-CM | POA: Diagnosis not present

## 2020-05-15 DIAGNOSIS — M6281 Muscle weakness (generalized): Secondary | ICD-10-CM | POA: Diagnosis not present

## 2020-05-15 DIAGNOSIS — R2681 Unsteadiness on feet: Secondary | ICD-10-CM | POA: Diagnosis not present

## 2020-05-16 ENCOUNTER — Other Ambulatory Visit: Payer: Self-pay | Admitting: *Deleted

## 2020-05-16 MED ORDER — AMIODARONE HCL 200 MG PO TABS: 100.0000 mg | ORAL_TABLET | Freq: Every day | ORAL | 3 refills | Status: DC

## 2020-05-16 NOTE — Telephone Encounter (Signed)
Rx(s) sent to pharmacy electronically.  

## 2020-05-20 DIAGNOSIS — M25552 Pain in left hip: Secondary | ICD-10-CM | POA: Diagnosis not present

## 2020-05-20 DIAGNOSIS — R2681 Unsteadiness on feet: Secondary | ICD-10-CM | POA: Diagnosis not present

## 2020-05-20 DIAGNOSIS — M6281 Muscle weakness (generalized): Secondary | ICD-10-CM | POA: Diagnosis not present

## 2020-05-20 DIAGNOSIS — M25562 Pain in left knee: Secondary | ICD-10-CM | POA: Diagnosis not present

## 2020-05-22 DIAGNOSIS — M25552 Pain in left hip: Secondary | ICD-10-CM | POA: Diagnosis not present

## 2020-05-22 DIAGNOSIS — M6281 Muscle weakness (generalized): Secondary | ICD-10-CM | POA: Diagnosis not present

## 2020-05-22 DIAGNOSIS — M25562 Pain in left knee: Secondary | ICD-10-CM | POA: Diagnosis not present

## 2020-05-22 DIAGNOSIS — R2681 Unsteadiness on feet: Secondary | ICD-10-CM | POA: Diagnosis not present

## 2020-05-24 ENCOUNTER — Other Ambulatory Visit: Payer: Self-pay | Admitting: Adult Health

## 2020-05-24 ENCOUNTER — Other Ambulatory Visit: Payer: Self-pay | Admitting: Cardiology

## 2020-05-24 ENCOUNTER — Other Ambulatory Visit: Payer: Self-pay

## 2020-05-24 DIAGNOSIS — M6281 Muscle weakness (generalized): Secondary | ICD-10-CM | POA: Diagnosis not present

## 2020-05-24 DIAGNOSIS — M25552 Pain in left hip: Secondary | ICD-10-CM | POA: Diagnosis not present

## 2020-05-24 DIAGNOSIS — M25562 Pain in left knee: Secondary | ICD-10-CM | POA: Diagnosis not present

## 2020-05-24 DIAGNOSIS — R2681 Unsteadiness on feet: Secondary | ICD-10-CM | POA: Diagnosis not present

## 2020-05-24 MED ORDER — ATORVASTATIN CALCIUM 80 MG PO TABS: 1.0000 | ORAL_TABLET | Freq: Every day | ORAL | 3 refills | Status: DC

## 2020-05-27 DIAGNOSIS — R2681 Unsteadiness on feet: Secondary | ICD-10-CM | POA: Diagnosis not present

## 2020-05-27 DIAGNOSIS — M25562 Pain in left knee: Secondary | ICD-10-CM | POA: Diagnosis not present

## 2020-05-27 DIAGNOSIS — M25552 Pain in left hip: Secondary | ICD-10-CM | POA: Diagnosis not present

## 2020-05-27 DIAGNOSIS — M6281 Muscle weakness (generalized): Secondary | ICD-10-CM | POA: Diagnosis not present

## 2020-05-29 DIAGNOSIS — M6281 Muscle weakness (generalized): Secondary | ICD-10-CM | POA: Diagnosis not present

## 2020-05-29 DIAGNOSIS — M25552 Pain in left hip: Secondary | ICD-10-CM | POA: Diagnosis not present

## 2020-05-29 DIAGNOSIS — M25562 Pain in left knee: Secondary | ICD-10-CM | POA: Diagnosis not present

## 2020-05-29 DIAGNOSIS — R2681 Unsteadiness on feet: Secondary | ICD-10-CM | POA: Diagnosis not present

## 2020-05-30 DIAGNOSIS — Z961 Presence of intraocular lens: Secondary | ICD-10-CM | POA: Diagnosis not present

## 2020-05-30 DIAGNOSIS — H40013 Open angle with borderline findings, low risk, bilateral: Secondary | ICD-10-CM | POA: Diagnosis not present

## 2020-05-30 DIAGNOSIS — H52203 Unspecified astigmatism, bilateral: Secondary | ICD-10-CM | POA: Diagnosis not present

## 2020-05-31 DIAGNOSIS — R2681 Unsteadiness on feet: Secondary | ICD-10-CM | POA: Diagnosis not present

## 2020-05-31 DIAGNOSIS — M6281 Muscle weakness (generalized): Secondary | ICD-10-CM | POA: Diagnosis not present

## 2020-05-31 DIAGNOSIS — M25552 Pain in left hip: Secondary | ICD-10-CM | POA: Diagnosis not present

## 2020-05-31 DIAGNOSIS — M25562 Pain in left knee: Secondary | ICD-10-CM | POA: Diagnosis not present

## 2020-06-03 DIAGNOSIS — R2681 Unsteadiness on feet: Secondary | ICD-10-CM | POA: Diagnosis not present

## 2020-06-03 DIAGNOSIS — M25552 Pain in left hip: Secondary | ICD-10-CM | POA: Diagnosis not present

## 2020-06-03 DIAGNOSIS — M6281 Muscle weakness (generalized): Secondary | ICD-10-CM | POA: Diagnosis not present

## 2020-06-03 DIAGNOSIS — M25562 Pain in left knee: Secondary | ICD-10-CM | POA: Diagnosis not present

## 2020-06-05 DIAGNOSIS — M25552 Pain in left hip: Secondary | ICD-10-CM | POA: Diagnosis not present

## 2020-06-05 DIAGNOSIS — M6281 Muscle weakness (generalized): Secondary | ICD-10-CM | POA: Diagnosis not present

## 2020-06-05 DIAGNOSIS — M25562 Pain in left knee: Secondary | ICD-10-CM | POA: Diagnosis not present

## 2020-06-05 DIAGNOSIS — R2681 Unsteadiness on feet: Secondary | ICD-10-CM | POA: Diagnosis not present

## 2020-06-07 DIAGNOSIS — R2681 Unsteadiness on feet: Secondary | ICD-10-CM | POA: Diagnosis not present

## 2020-06-07 DIAGNOSIS — M25552 Pain in left hip: Secondary | ICD-10-CM | POA: Diagnosis not present

## 2020-06-07 DIAGNOSIS — M25562 Pain in left knee: Secondary | ICD-10-CM | POA: Diagnosis not present

## 2020-06-07 DIAGNOSIS — M6281 Muscle weakness (generalized): Secondary | ICD-10-CM | POA: Diagnosis not present

## 2020-06-10 DIAGNOSIS — M25552 Pain in left hip: Secondary | ICD-10-CM | POA: Diagnosis not present

## 2020-06-10 DIAGNOSIS — R2681 Unsteadiness on feet: Secondary | ICD-10-CM | POA: Diagnosis not present

## 2020-06-10 DIAGNOSIS — M6281 Muscle weakness (generalized): Secondary | ICD-10-CM | POA: Diagnosis not present

## 2020-06-10 DIAGNOSIS — M25562 Pain in left knee: Secondary | ICD-10-CM | POA: Diagnosis not present

## 2020-06-11 ENCOUNTER — Telehealth: Payer: Self-pay | Admitting: Cardiology

## 2020-06-11 ENCOUNTER — Telehealth: Payer: Medicare Other | Admitting: *Deleted

## 2020-06-11 DIAGNOSIS — Z23 Encounter for immunization: Secondary | ICD-10-CM | POA: Diagnosis not present

## 2020-06-11 NOTE — Telephone Encounter (Signed)
Patient states she has been having issues with her hip and she would like to know if Dr. Ellyn Hack has a recommendation for an over-the-counter medication that will not interact with her cardiac medications. Please return call to discuss.

## 2020-06-11 NOTE — Telephone Encounter (Signed)
Mast, [name], NP  You 14 minutes ago (3:52 PM)    I am not recommend OTC sleeping aid. For hip pain, she Chattie Greeson try Tylenol 650mg  3 times a day with meals. Thanks.    Message text      Patient Notified and agreed.

## 2020-06-11 NOTE — Telephone Encounter (Signed)
Called patient, advised of message from PharmD.  Patient will see PCP in regards to anything further.    Patient verbalized understanding.

## 2020-06-11 NOTE — Telephone Encounter (Signed)
Called patient, she states that she already takes 2 extra strength tylenol at night to help with the pain in her hip and she would like to know what else she could do. She states this is an achy pain- and it causes her not to sleep, so she also wants to also know what she can take in regards to sleep as well.   Patient thankful for call back.

## 2020-06-11 NOTE — Telephone Encounter (Signed)
Patient called and stated that her Cardiologist told her to call our office regarding concerns:  1. Wants to know if it is ok to take OTC Sleeping medication called Night time Sleep Aid  2. Wants to know if there is anything she can take for pain medication for hip pain until she can get in with her Orthopaedic on 6/8.   Please Advise.

## 2020-06-11 NOTE — Telephone Encounter (Signed)
Okay to continue taking Tylenol (no more than 4 tablets per day). Can use topical agents like Bengay, Aspercream, IcyHot , etc.  For further assessment and treatment , please contact PCP or orthopedic doctor.

## 2020-06-12 DIAGNOSIS — M25562 Pain in left knee: Secondary | ICD-10-CM | POA: Diagnosis not present

## 2020-06-12 DIAGNOSIS — R2681 Unsteadiness on feet: Secondary | ICD-10-CM | POA: Diagnosis not present

## 2020-06-12 DIAGNOSIS — M25552 Pain in left hip: Secondary | ICD-10-CM | POA: Diagnosis not present

## 2020-06-12 DIAGNOSIS — M6281 Muscle weakness (generalized): Secondary | ICD-10-CM | POA: Diagnosis not present

## 2020-06-14 DIAGNOSIS — M6281 Muscle weakness (generalized): Secondary | ICD-10-CM | POA: Diagnosis not present

## 2020-06-14 DIAGNOSIS — R2681 Unsteadiness on feet: Secondary | ICD-10-CM | POA: Diagnosis not present

## 2020-06-14 DIAGNOSIS — M25552 Pain in left hip: Secondary | ICD-10-CM | POA: Diagnosis not present

## 2020-06-14 DIAGNOSIS — M25562 Pain in left knee: Secondary | ICD-10-CM | POA: Diagnosis not present

## 2020-06-17 DIAGNOSIS — M25562 Pain in left knee: Secondary | ICD-10-CM | POA: Diagnosis not present

## 2020-06-17 DIAGNOSIS — M25552 Pain in left hip: Secondary | ICD-10-CM | POA: Diagnosis not present

## 2020-06-17 DIAGNOSIS — M6281 Muscle weakness (generalized): Secondary | ICD-10-CM | POA: Diagnosis not present

## 2020-06-17 DIAGNOSIS — R2681 Unsteadiness on feet: Secondary | ICD-10-CM | POA: Diagnosis not present

## 2020-06-19 DIAGNOSIS — M7062 Trochanteric bursitis, left hip: Secondary | ICD-10-CM | POA: Diagnosis not present

## 2020-06-19 DIAGNOSIS — M25562 Pain in left knee: Secondary | ICD-10-CM | POA: Diagnosis not present

## 2020-06-21 DIAGNOSIS — M25562 Pain in left knee: Secondary | ICD-10-CM | POA: Diagnosis not present

## 2020-06-21 DIAGNOSIS — R2681 Unsteadiness on feet: Secondary | ICD-10-CM | POA: Diagnosis not present

## 2020-06-21 DIAGNOSIS — M6281 Muscle weakness (generalized): Secondary | ICD-10-CM | POA: Diagnosis not present

## 2020-06-21 DIAGNOSIS — M25552 Pain in left hip: Secondary | ICD-10-CM | POA: Diagnosis not present

## 2020-06-25 DIAGNOSIS — L84 Corns and callosities: Secondary | ICD-10-CM | POA: Diagnosis not present

## 2020-06-25 DIAGNOSIS — B351 Tinea unguium: Secondary | ICD-10-CM | POA: Diagnosis not present

## 2020-06-25 DIAGNOSIS — E1159 Type 2 diabetes mellitus with other circulatory complications: Secondary | ICD-10-CM | POA: Diagnosis not present

## 2020-06-26 DIAGNOSIS — R2681 Unsteadiness on feet: Secondary | ICD-10-CM | POA: Diagnosis not present

## 2020-06-26 DIAGNOSIS — M25552 Pain in left hip: Secondary | ICD-10-CM | POA: Diagnosis not present

## 2020-06-26 DIAGNOSIS — M25562 Pain in left knee: Secondary | ICD-10-CM | POA: Diagnosis not present

## 2020-06-26 DIAGNOSIS — M6281 Muscle weakness (generalized): Secondary | ICD-10-CM | POA: Diagnosis not present

## 2020-06-28 DIAGNOSIS — M6281 Muscle weakness (generalized): Secondary | ICD-10-CM | POA: Diagnosis not present

## 2020-06-28 DIAGNOSIS — R2681 Unsteadiness on feet: Secondary | ICD-10-CM | POA: Diagnosis not present

## 2020-06-28 DIAGNOSIS — M25562 Pain in left knee: Secondary | ICD-10-CM | POA: Diagnosis not present

## 2020-06-28 DIAGNOSIS — M25552 Pain in left hip: Secondary | ICD-10-CM | POA: Diagnosis not present

## 2020-07-01 DIAGNOSIS — R2681 Unsteadiness on feet: Secondary | ICD-10-CM | POA: Diagnosis not present

## 2020-07-01 DIAGNOSIS — M6281 Muscle weakness (generalized): Secondary | ICD-10-CM | POA: Diagnosis not present

## 2020-07-01 DIAGNOSIS — M25562 Pain in left knee: Secondary | ICD-10-CM | POA: Diagnosis not present

## 2020-07-01 DIAGNOSIS — M25552 Pain in left hip: Secondary | ICD-10-CM | POA: Diagnosis not present

## 2020-07-03 DIAGNOSIS — R2681 Unsteadiness on feet: Secondary | ICD-10-CM | POA: Diagnosis not present

## 2020-07-03 DIAGNOSIS — M25552 Pain in left hip: Secondary | ICD-10-CM | POA: Diagnosis not present

## 2020-07-03 DIAGNOSIS — M25562 Pain in left knee: Secondary | ICD-10-CM | POA: Diagnosis not present

## 2020-07-03 DIAGNOSIS — M6281 Muscle weakness (generalized): Secondary | ICD-10-CM | POA: Diagnosis not present

## 2020-07-05 DIAGNOSIS — M6281 Muscle weakness (generalized): Secondary | ICD-10-CM | POA: Diagnosis not present

## 2020-07-05 DIAGNOSIS — M25552 Pain in left hip: Secondary | ICD-10-CM | POA: Diagnosis not present

## 2020-07-05 DIAGNOSIS — M25562 Pain in left knee: Secondary | ICD-10-CM | POA: Diagnosis not present

## 2020-07-05 DIAGNOSIS — R2681 Unsteadiness on feet: Secondary | ICD-10-CM | POA: Diagnosis not present

## 2020-07-08 DIAGNOSIS — M6281 Muscle weakness (generalized): Secondary | ICD-10-CM | POA: Diagnosis not present

## 2020-07-08 DIAGNOSIS — R2681 Unsteadiness on feet: Secondary | ICD-10-CM | POA: Diagnosis not present

## 2020-07-08 DIAGNOSIS — M25562 Pain in left knee: Secondary | ICD-10-CM | POA: Diagnosis not present

## 2020-07-08 DIAGNOSIS — M25552 Pain in left hip: Secondary | ICD-10-CM | POA: Diagnosis not present

## 2020-07-10 DIAGNOSIS — R2681 Unsteadiness on feet: Secondary | ICD-10-CM | POA: Diagnosis not present

## 2020-07-10 DIAGNOSIS — M6281 Muscle weakness (generalized): Secondary | ICD-10-CM | POA: Diagnosis not present

## 2020-07-10 DIAGNOSIS — M25552 Pain in left hip: Secondary | ICD-10-CM | POA: Diagnosis not present

## 2020-07-10 DIAGNOSIS — M25562 Pain in left knee: Secondary | ICD-10-CM | POA: Diagnosis not present

## 2020-07-12 DIAGNOSIS — R2681 Unsteadiness on feet: Secondary | ICD-10-CM | POA: Diagnosis not present

## 2020-07-12 DIAGNOSIS — M25552 Pain in left hip: Secondary | ICD-10-CM | POA: Diagnosis not present

## 2020-07-12 DIAGNOSIS — M25562 Pain in left knee: Secondary | ICD-10-CM | POA: Diagnosis not present

## 2020-07-12 DIAGNOSIS — M6281 Muscle weakness (generalized): Secondary | ICD-10-CM | POA: Diagnosis not present

## 2020-07-17 DIAGNOSIS — M6281 Muscle weakness (generalized): Secondary | ICD-10-CM | POA: Diagnosis not present

## 2020-07-17 DIAGNOSIS — R2681 Unsteadiness on feet: Secondary | ICD-10-CM | POA: Diagnosis not present

## 2020-07-17 DIAGNOSIS — M25562 Pain in left knee: Secondary | ICD-10-CM | POA: Diagnosis not present

## 2020-07-17 DIAGNOSIS — M25552 Pain in left hip: Secondary | ICD-10-CM | POA: Diagnosis not present

## 2020-07-17 IMAGING — DX DG CHEST 1V PORT
1 series · 1 of 1 positions shown · non-contrast
Comparison: None.

CLINICAL DATA: Neck and chest tightness.

EXAM:
PORTABLE CHEST 1 VIEW

[chest]
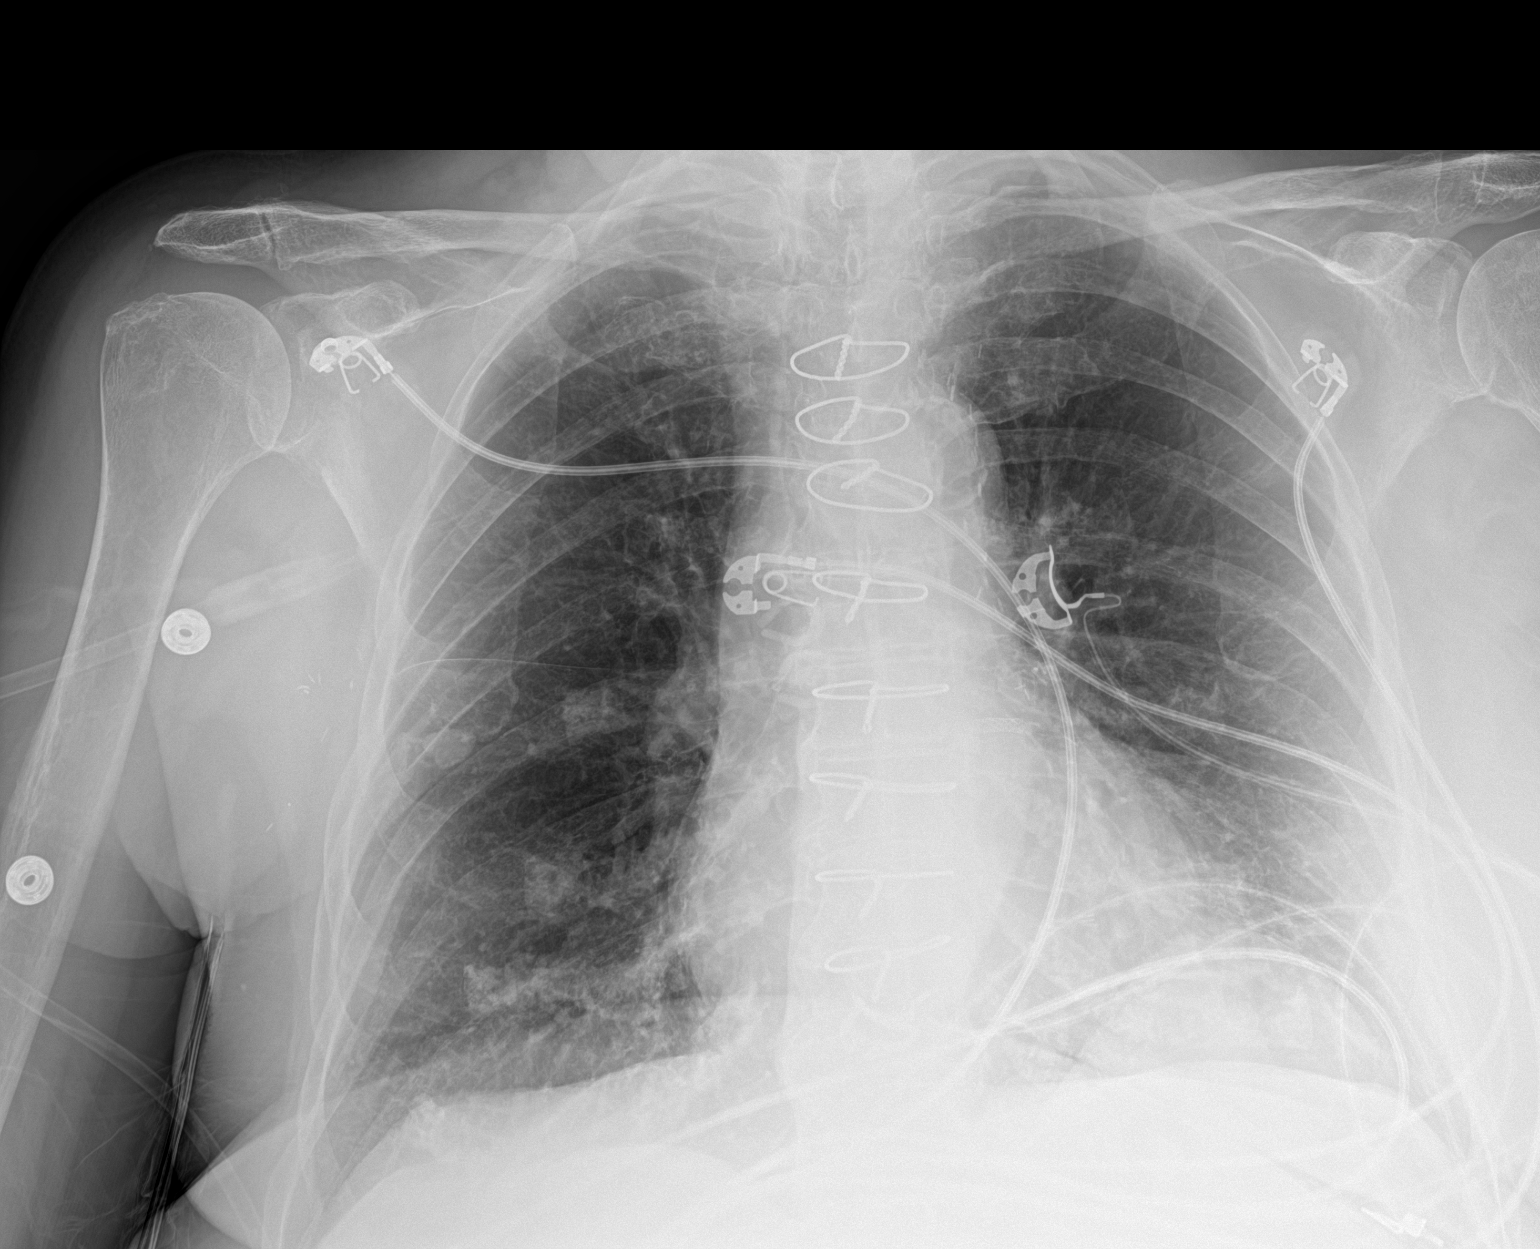

[1 of 1 positions shown; findings below may reference images not displayed]

FINDINGS: Multiple sternal wires vascular clips are seen.

Mild, chronic appearing increased lung markings are seen without
evidence of acute infiltrate, pleural effusion or pneumothorax.

The heart size and mediastinal contours are within normal limits.

There is moderate severity calcification aortic arch.

Degenerative changes are seen throughout the thoracic spine.
IMPRESSION: 1. Evidence of prior median sternotomy/CABG.
2. No acute or active cardiopulmonary disease.

## 2020-07-19 DIAGNOSIS — M25552 Pain in left hip: Secondary | ICD-10-CM | POA: Diagnosis not present

## 2020-07-19 DIAGNOSIS — M6281 Muscle weakness (generalized): Secondary | ICD-10-CM | POA: Diagnosis not present

## 2020-07-19 DIAGNOSIS — M25562 Pain in left knee: Secondary | ICD-10-CM | POA: Diagnosis not present

## 2020-07-19 DIAGNOSIS — R2681 Unsteadiness on feet: Secondary | ICD-10-CM | POA: Diagnosis not present

## 2020-07-24 DIAGNOSIS — M25552 Pain in left hip: Secondary | ICD-10-CM | POA: Diagnosis not present

## 2020-07-24 DIAGNOSIS — R2681 Unsteadiness on feet: Secondary | ICD-10-CM | POA: Diagnosis not present

## 2020-07-24 DIAGNOSIS — M6281 Muscle weakness (generalized): Secondary | ICD-10-CM | POA: Diagnosis not present

## 2020-07-24 DIAGNOSIS — M25562 Pain in left knee: Secondary | ICD-10-CM | POA: Diagnosis not present

## 2020-07-29 DIAGNOSIS — M25562 Pain in left knee: Secondary | ICD-10-CM | POA: Diagnosis not present

## 2020-07-29 DIAGNOSIS — M25552 Pain in left hip: Secondary | ICD-10-CM | POA: Diagnosis not present

## 2020-07-29 DIAGNOSIS — M6281 Muscle weakness (generalized): Secondary | ICD-10-CM | POA: Diagnosis not present

## 2020-07-29 DIAGNOSIS — R2681 Unsteadiness on feet: Secondary | ICD-10-CM | POA: Diagnosis not present

## 2020-07-30 DIAGNOSIS — B351 Tinea unguium: Secondary | ICD-10-CM | POA: Diagnosis not present

## 2020-07-30 DIAGNOSIS — E1159 Type 2 diabetes mellitus with other circulatory complications: Secondary | ICD-10-CM | POA: Diagnosis not present

## 2020-07-30 DIAGNOSIS — L84 Corns and callosities: Secondary | ICD-10-CM | POA: Diagnosis not present

## 2020-08-02 ENCOUNTER — Other Ambulatory Visit: Payer: Self-pay | Admitting: Adult Health

## 2020-08-05 DIAGNOSIS — M7062 Trochanteric bursitis, left hip: Secondary | ICD-10-CM | POA: Diagnosis not present

## 2020-08-06 ENCOUNTER — Telehealth: Payer: Self-pay | Admitting: Cardiology

## 2020-08-06 MED ORDER — ATORVASTATIN CALCIUM 80 MG PO TABS: 80.0000 mg | ORAL_TABLET | Freq: Every day | ORAL | 3 refills | Status: DC

## 2020-08-06 NOTE — Telephone Encounter (Signed)
Refill entered, Mychart message sent

## 2020-08-06 NOTE — Telephone Encounter (Signed)
Pt c/o medication issue:  1. Name of Medication: atorvastatin (LIPITOR) 80 MG tablet  2. How are you currently taking this medication (dosage and times per day)? 80 mg as directed  3. Are you having a reaction (difficulty breathing--STAT)?   4. What is your medication issue? Patient got a letter from [place], [place] to [facility]  that states they were unable to process their automatic refills for this medication.  The patient was unsure as to why she got this letter and was reaching out to Dr. Ellyn Hack for further clarification.  Please call patient

## 2020-08-07 ENCOUNTER — Other Ambulatory Visit: Payer: Self-pay

## 2020-08-07 ENCOUNTER — Ambulatory Visit (INDEPENDENT_AMBULATORY_CARE_PROVIDER_SITE_OTHER): Payer: Medicare Other | Admitting: Dermatology

## 2020-08-07 DIAGNOSIS — Z85828 Personal history of other malignant neoplasm of skin: Secondary | ICD-10-CM | POA: Diagnosis not present

## 2020-08-07 DIAGNOSIS — L821 Other seborrheic keratosis: Secondary | ICD-10-CM | POA: Diagnosis not present

## 2020-08-07 DIAGNOSIS — I249 Acute ischemic heart disease, unspecified: Secondary | ICD-10-CM

## 2020-08-07 NOTE — Patient Instructions (Signed)
Seborrheic Keratosis A seborrheic keratosis is a common, noncancerous (benign) skin growth. These growths are velvety, waxy, rough, tan, brown, or black spots that appear on the skin. These skin growths can be flat or raised, andscaly. What are the causes? The cause of this condition is not known. What increases the risk? You are more likely to develop this condition if you: Have a family history of seborrheic keratosis. Are 50 or older. Are pregnant. Have had estrogen replacement therapy. What are the signs or symptoms? Symptoms of this condition include growths on the face, chest, shoulders, back, or other areas. These growths: Are usually painless, but may become irritated and itchy. Can be yellow, brown, black, or other colors. Are slightly raised or have a flat surface. Are sometimes rough or wart-like in texture. Are often velvety or waxy on the surface. Are round or oval-shaped. Often occur in groups, but may occur as a single growth. How is this diagnosed? This condition is diagnosed with a medical history and physical exam. A sample of the growth may be tested (skin biopsy). You may need to see a skin specialist (dermatologist). How is this treated? Treatment is not usually needed for this condition, unless the growths are irritated or bleed often. You may also choose to have the growths removed if you do not like their appearance. Most commonly, these growths are treated with a procedure in which liquid nitrogen is applied to "freeze" off the growth (cryosurgery). They may also be burned off with electricity (electrocautery) or removed by scraping (curettage). Follow these instructions at home: Watch your growth for any changes. Keep all follow-up visits as told by your health care provider. This is important. Do not scratch or pick at the growth or growths. This can cause them to become irritated or infected. Contact a health care provider if: You suddenly have many new  growths. Your growth bleeds, itches, or hurts. Your growth suddenly becomes larger or changes color. Summary A seborrheic keratosis is a common, noncancerous (benign) skin growth. Treatment is not usually needed for this condition, unless the growths are irritated or bleed often. Watch your growth for any changes. Contact a health care provider if you suddenly have many new growths or your growth suddenly becomes larger or changes color. Keep all follow-up visits as told by your health care provider. This is important. This information is not intended to replace advice given to you by your health care provider. Make sure you discuss any questions you have with your healthcare provider. Document Revised: 05/13/2017 Document Reviewed: 05/13/2017 [employer] Patient Education  2022 [employer]  

## 2020-08-20 ENCOUNTER — Encounter: Payer: Self-pay | Admitting: Cardiology

## 2020-08-20 NOTE — Progress Notes (Signed)
Primary Care Provider: Mast, [name], NP Cardiologist: Glenetta Hew, MD Electrophysiologist: None  Clinic Note: Chief Complaint  Patient presents with   Follow-up    6 months.   Coronary Artery Disease    No active angina-no resting or exertional dyspnea.   Atrial Fibrillation    Does not really notice any symptoms of palpitations or irregular heartbeats.  Maintained on amiodarone and Eliquis.   Congestive Heart Failure    HFpEF-only really exacerbated when in A. fib.    ===================================  ASSESSMENT/PLAN   Problem List Items Addressed This Visit       Cardiology Problems   Coronary artery disease involving coronary bypass graft of native heart with angina pectoris (HCC) (Chronic)   Relevant Orders   EKG 12-Lead (Completed)   Paroxysmal atrial fibrillation/flutter (HCC) (Chronic)    Diagnosed during an episode of what sounds like ACS.  Noted to be hypotensive and bradycardic in PCPs office and therefore beta-blocker dose was reduced.  Now currently on 50 mg.  This patients CHA2DS2-VASc Score and unadjusted Ischemic Stroke Rate (% per year) is equal to 9.7 % stroke rate/year from a score of 35 (age x2, female, HTN, CAD, CHF)  Plan:  Continue rhythm control with 100 mg amiodarone (for breakthrough symptoms, double dose) We clarified which dose of metoprolol she is taking.  She is on the 50 mg Toprol-would not titrate further because of bradycardia. On Eliquis.  No bleeding issues.      Relevant Orders   EKG 12-Lead (Completed)   Coronary artery disease involving native coronary artery of native heart with angina pectoris ([place]) - Primary (Chronic)    Both native and graft disease.  Most recent cardiac catheterization was in April 20 21.  Native LCx and D1 are occluded along with OM1.  Graft to D1 is occluded as well.  However the graft to OM 2 and LIMA to LAD are both patent.  Would not need another ischemic evaluation until 2025 unless symptoms  warrant.  I suspect that she could easily have anginal symptoms from demand ischemia in setting of A. fib RVR.  At present not currently having anginal symptoms.  Plan: Not on aspirin or Plavix because of Eliquis. On stable dose of beta-blocker and amiodarone along with ACE inhibitor. Still on high-dose atorvastatin-seems to be able to tolerate high-dose. .        Relevant Orders   EKG 12-Lead (Completed)   Essential hypertension (Chronic)   Hyperlipidemia, goal<70 (Chronic)    Labs checked in March showed LDL 74.  Not quite at goal, but I am leery of adjusting too much.  She is on high-dose statin.  Only other option will be to add a new medicine.  She is really not wanting to take new medications.  We talked about adjusting diet and trying to pick up her exercise.  Hopefully we can see an improvement.  She may be due for labs recheck soon.  Otherwise will be due in March.  Hopefully when labs are checked, the amiodarone labs will also be checked.      (HFpEF) heart failure with preserved ejection fraction (HCC) (Chronic)    Normal EF but diastolic function, exacerbated by A. fib (noted to have elevated LVEDP on cath and echo).  As long as she is not in A. fib, she seems to doing fairly well.  She has some mild baseline exertional dyspnea but is probably related to combination of factors.  She is on stable dose of  enalapril and Toprol.  Euvolemic on exam, not requiring diuretic.  She is less likely to have CHF symptoms unless she is in A. fib.         Other   Current use of long term anticoagulation (Chronic)    With PAF and CHA2DS2-VASc of 6, on long-term Eliquis.  Renal function is stable and therefore he on stable dose.  Okay to hold Eliquis 2-3 days preop for surgeries or procedures.  (2 days from Mohs procedures and 3 days for high risk.      CKD (chronic kidney disease) stage 3, GFR 30-59 ml/min (HCC)    Most recent creatinine was 1.06.  Relatively stable.      OSA  on CPAP (Chronic)    Sleeps well with CPAP.  Tolerating well.      On amiodarone therapy (Chronic)    Labs been followed by her in an effort to avoid excess lab draws.  I would therefore asked that we make sure that she has annual LFTs TFTs as well as CRP and sed rate (to evaluate lung toxicity) check.  She also needs annual eye exams.  With her advanced age, unless she has pulmonary symptoms, would not check PFTs.      ===================================  HPI:    Stephanie Shea is a 87 y.o. female with a Extensive Cardiac History noted below who presents today for routine 57-monthfollow-up CAD history dates back to 2001 -- progressive exertional angina (bilateral arm aching. 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 2001 -- ([place], Dr. TGlade Lloyd Cath-PCI of pLAD & PTCA of ostial D1 (jailed), dLAD PTCA.  PCI of pCx, but unable to open dCx-OM.  IVIS of left main ? Peri-op MI 2002 --> CABG ([facility] -> has had stent placed in the ostial SVG-OM 2006 - [facility], peri-op MI for TAH -- PCI Last STRESS TEST in 2018 by former Cardiologist (Dr. GKellie Simmering  [place], [place] [phone] - no further action [place]  Initially seen by me in January 2019.  Stable. 04/2019 - Admitted for angina (required 2nd NTG)-> cath;  Occluded SVG-Diag TTE 04/24/2019: EF 65 to 70%.  Moderate septal LVH.  GR 1 DD-elevated EDP.  Normal PAP.  Relatively normal valves.  No aortic stenosis. LHC 04/25/2019: ost-prox LCx stent @ OM1 ostium =100% CTO, Ost-prox LAD stent ~30% ISR, Ost D1 CTO, prox RCA ~30%. SCG-OM2 patent with ostial stent 30%, LIMA-LAD patent; CTO of SVG-D1. => Newly occluded SVG-diagonal, no invasive options. PAROXYSMAL ATRIAL FIBRILLATION/FLUTTER (new diagnosis in 2021); CHA2DS2-VASc score 5; currently on AMIODARONE and ELIQUIS. Mild HFpEF-exacerbated by A. fib.  No diuretic. OSA on CPAP HTN, HLD  BTakelia Urietawas last seen on March 08, 2020.  She  was doing fairly well.  Not really overdoing it.  Is doing an exercise class every day that it is offered at least 4 days a week.  Also tries to walk when she can.  Troubled more by hip pain then mild dyspnea.  Usually does not start having short of breath until her hip hurts.  Gets pretty good sleep with CPAP.  Seems to have the had improvement in energy level.No angina --> Reduce amiodarone to 100 mg daily and checked labs including CRP and ESR along with TSH..Marland Kitchen Recent Hospitalizations: None  Reviewed  CV studies:    The following studies were reviewed today: (if available, images/films reviewed: From Epic Chart or Care Everywhere) None:   Interval  History:   Daanya Lanphier presents here today-accompanied by her daughter Johnny Bridge).  She says that she is not really noticing any worsening dyspnea.  She really has no symptoms whatsoever of being in or out of A. fib.  She is really limited mostly by her left hip and knee pain.  She is walks at least 10 to 75mnutes throughout the friend's home facility.  She goes to multiple exercise classes, and walks as far as she can go with her hip pain without stopping to get these classes.  She denies any chest tightness/pressure or dyspnea with rest or exertion.  She denies any PND, orthopnea or edema.  She sleeps well with CPAP.  Energy level seems to be doing okay.  CV Review of Symptoms (Summary): no chest pain or dyspnea on exertion positive for - mild exercise intolerance-more related to knee and hip pain.  Energy level still down, but is better.  She does have trivial edema. negative for - chest pain, irregular heartbeat, orthopnea, palpitations, paroxysmal nocturnal dyspnea, rapid heart rate, shortness of breath, or lightheadedness, dizziness or wooziness, syncope/near syncope, TIA/amaurosis fugax or claudication.    The patient does not have symptoms concerning for COVID-19 infection (fever, chills, cough, or new shortness of breath).    REVIEWED OF SYSTEMS   Review of Systems  Constitutional:  Positive for malaise/fatigue (Still has some reduced energy, but thinks that it takes a lot of energy to walk because of knee and hip pain..Marland Kitchen. Negative for weight loss.  HENT:  Negative for congestion and nosebleeds.   Respiratory:  Negative for cough and shortness of breath.   Cardiovascular:  Positive for leg swelling (Has some swelling of the left ankle and knee-not edema.).  Gastrointestinal:  Negative for blood in stool, constipation and melena.  Genitourinary:  Negative for dysuria, hematuria and urgency.  Musculoskeletal:  Positive for joint pain (Hips and knees, left worse than right). Negative for back pain.  Neurological:  Positive for weakness (Some mild leg weakness-more on the left than the right). Negative for dizziness (Only if she stands up fast) and focal weakness.  Psychiatric/Behavioral:  Positive for memory loss. Negative for depression. The patient is not nervous/anxious and does not have insomnia.   I have reviewed and (if needed) personally updated the patient's problem list, medications, allergies, past medical and surgical history, social and family history.   PAST MEDICAL HISTORY   Past Medical History:  Diagnosis Date   Atherosclerosis of native coronary artery 2001   LAD & Cx disease -- BMS PCI in 2001; CABG 2002.  PCI in 2006 (per-op TAH)   Atrial fibrillation and flutter ([place] 04/2019   New diagnosis-major complaint was chest tightness and dyspnea.   Breast cancer ([place]    Cervix dysplasia    GERD (gastroesophageal reflux disease)    [place]   Both events reportedly were perioperatively. She has no recollection. Initial PCI was related to angina symptoms (bilateral arm pain)   High cholesterol    Hypertension    Lumbosacral spinal stenosis    Osteoarthritis    Skin cancer    Sleep apnea    Uses CPAP faithfully   Squamous cell carcinoma of skin 02/12/2020   in situ- left  neck-anterior (CX35FU)    PAST SURGICAL HISTORY   Past Surgical History:  Procedure Laterality Date   ABDOMINAL HYSTERECTOMY     bone density     CARDIAC CATHETERIZATION  2001    [facility]. TGlade Lloyd([facility]  2 vessel CAD involving LAD-D1 and distal Cx-OM. Appearance of dilated tapered left main with no significant atherosclerosis on IVUS --  BMS PCI of pLAD ([facility] BMS 3.0 mm x 15 mm) & with PTCA of ostial D1, PTCA of dLAD.  BMS PCI pCx [facility] BMS 3.5 mm x 11 mm) - unable to reopen dCx-OM (probable distal Atheroembolic occlusion.  Normal LVEF.   COLONOSCOPY     CORONARY ARTERY BYPASS GRAFT  2002   Unsure of details [facility], in [facility])   [place]  2001   ([place]) [facility] BMS PCI pLAD (3.0 mm x 15 mm) -PTCA of jailed D1 as well as distal LAD; pCx ([facility] BMS 3.5 mm x 11 mm) -unable to open distalCx-OM -thromboembolic occlusion   DIAGNOSTIC MAMMOGRAM     LEFT HEART CATH AND CORS/GRAFTS ANGIOGRAPHY N/A 04/25/2019   Procedure: LEFT HEART CATH AND CORS/GRAFTS ANGIOGRAPHY;  Surgeon: Troy Sine, MD;  Location: [facility];;; ost-prox LCx stent @ OM1 ostium =100% CTO, Ost-prox LAD stent ~30% ISR, Ost D1 CTO, prox RCA ~30%. SCG-OM2 patent with ostial stent 30%, LIMA-LAD patent; CTO of SVG-D1.    left knee replacement     MASTECTOMY     NM MYOVIEW LTD  12/2014; 2018   Cardiologist (Dr. Kellie Simmering.  [place], [place] ph# [phone]) -->LEXISCAN: Nonischemic, "normal "   pap smear     tonsilectomy     TRANSTHORACIC ECHOCARDIOGRAM  12/2014   Echocardiogram 12/25/14- normal LVEF and systolic function, EF 64-33%, mild grade 1 diastolic dysfunction   TRANSTHORACIC ECHOCARDIOGRAM  04/24/2019   EF 65 to 70%.  Moderate septal LVH.  GR 1 DD-elevated EDP.  Normal PAP.  Relatively normal valves.  No aortic stenosis.   Cardiac Cath 04/25/2019:ost-prox LCx stent @ OM1 ostium =100% CTO, Ost-prox LAD stent ~30% ISR, Ost D1 CTO, prox RCA ~30%. SCG-OM2 patent with ostial stent 30%,  LIMA-LAD patent; CTO of SVG-D1.    Immunization History  Administered Date(s) Administered   Influenza, High Dose Seasonal PF 10/21/2016, 09/24/2017, 10/25/2019   Influenza-Unspecified 01/13/2016   Moderna Sars-Covid-2 Vaccination 01/16/2019, 02/13/2019   Pneumococcal-Unspecified 01/13/2016    MEDICATIONS/ALLERGIES   Current Meds  Medication Sig   acetaminophen (TYLENOL) 500 MG tablet Take 500 mg by mouth at bedtime as needed (for mild pain and "sleep").    amiodarone (PACERONE) 200 MG tablet Take 0.5 tablets (100 mg total) by mouth daily.   ammonium lactate (AMLACTIN) 12 % cream Apply topically as needed for dry skin.   apixaban (ELIQUIS) 5 MG TABS tablet Take 1 tablet (5 mg total) by mouth 2 (two) times daily.   atorvastatin (LIPITOR) 80 MG tablet Take 1 tablet (80 mg total) by mouth daily.   Cholecalciferol (VITAMIN D3) 50 MCG (2000 UT) TABS Take 2,000 Units by mouth daily after breakfast.   enalapril (VASOTEC) 10 MG tablet Take 1 tablet (10 mg total) by mouth daily.   fexofenadine (ALLEGRA) 180 MG tablet Take 180 mg by mouth daily as needed for allergies or rhinitis.    fluticasone (FLONASE) 50 MCG/ACT nasal spray Place 1 spray into both nostrils daily as needed for allergies or rhinitis.    metoprolol succinate (TOPROL-XL) 50 MG 24 hr tablet TAKE 1 TABLET BY MOUTH EVERY DAY   nitroGLYCERIN (NITROSTAT) 0.4 MG SL tablet PLACE 1 TABLET UNDER TONGUE EVERY 5 MINUTES FOR 3 DOSES FOR CHEST PAIN   omeprazole (PRILOSEC OTC) 20 MG tablet Take 20 mg by mouth daily before breakfast.  SHINGRIX injection    WHEAT DEXTRIN PO Take by mouth. Take 2 teaspoons full in to 8 oz of water or any drink by mouth once daily  --> Need to clarify which dose of metoprolol she is taking. -> deleted the 200 mg tablet  Allergies  Allergen Reactions   Codeine Nausea Only    SOCIAL HISTORY/FAMILY HISTORY   Reviewed in Epic:  Pertinent findings:  Social History   Tobacco Use   Smoking status: Never    Smokeless tobacco: Never  Vaping Use   Vaping Use: Never used  Substance Use Topics   Alcohol use: No   Drug use: No   Social History   Social History Narrative   Recently moved back to [place] (lives alone in a [place] apartment at [facility])   Do you drink/eat things with caffeine? 2 cups of coffee every day   What year were you married? [place] - twice widowed   No pets.      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 Has POA      Johnny Bridge (Daughter) [phone])      Primary Emergency Contact: Kroenke,JOHN L   Address: [place],  [place] [address]   Home Phone: [phone]    [place], EKG, labs   Wt Readings from Last 3 Encounters:  08/21/20 184 lb (83.5 kg)  03/08/20 182 lb (82.6 kg)  02/23/20 185 lb (83.9 kg)    Physical Exam: BP 128/64 (BP Location: Left Arm, Patient Position: Sitting, Cuff Size: Normal)   Pulse (!) 56   Ht _0  (1.702 m)   Wt 184 lb (83.5 kg)   BMI 28.82 kg/m   Physical Exam Vitals reviewed.  Constitutional:      General: She is not in acute distress.    Appearance: Normal appearance. She is normal weight. She is not ill-appearing or toxic-appearing.     Comments: Well-groomed.  Healthy appearing for age.  HENT:     Head: Normocephalic and atraumatic.  Neck:     Vascular: No carotid bruit or JVD.  Cardiovascular:     Rate and Rhythm: Regular rhythm. Bradycardia present.     Chest Wall: PMI is not displaced.     Pulses: Normal pulses.     Heart sounds: S1 normal and S2 normal. Murmur (Soft 1/6C-D SEM at RUSB--neck) heard.    No friction rub. No gallop.  Pulmonary:     Effort: Pulmonary effort is normal. No respiratory distress.     Breath sounds: Normal breath sounds.  Chest:     Chest wall: No tenderness.  Musculoskeletal:        General: No swelling. Normal range of motion.     Cervical  back: Normal range of motion and neck supple.     Comments: Uses a walker at baseline  Skin:    General: Skin is warm and dry.     Coloration: Skin is not pale.  Neurological:     Mental Status: She is alert and oriented to person, place, and time. Mental status is at baseline.     Gait: Gait abnormal.  Psychiatric:        Mood and Affect: Mood normal.        Behavior: Behavior normal.        Thought Content: Thought content normal.  Judgment: Judgment normal.    Adult ECG Report None  Recent Labs: Reviewed.  Were not checked when I saw her last time. Lab Results  Component Value Date   CHOL 169 04/05/2020   HDL 70 04/05/2020   LDLCALC 74 04/05/2020   TRIG 150 (H) 04/05/2020   CHOLHDL 2.4 04/05/2020   Lab Results  Component Value Date   CREATININE 1.06 (H) 04/05/2020   BUN 15 04/05/2020   NA 142 04/05/2020   K 4.5 04/05/2020   CL 101 04/05/2020   CO2 24 04/05/2020   CBC Latest Ref Rng & Units 04/05/2020 08/07/2019 05/31/2019  WBC 3.4 - 10.8 x10E3/uL 5.4 6.1 6.0  Hemoglobin 11.1 - 15.9 g/dL 13.3 13.7 13.0  Hematocrit 34.0 - 46.6 % 39.9 39.7 39.0  Platelets 150 - 450 x10E3/uL 215 230 257    Lab Results  Component Value Date   TSH 1.510 04/05/2020   ==================================================  COVID-19 Education: The signs and symptoms of COVID-19 were discussed with the patient and how to seek care for testing (follow up with PCP or arrange E-visit).   The importance of social distancing and COVID-19 vaccination was discussed today. The patient is practicing social distancing & Masking.   I spent a total of 28 minutes with the patient spent in direct patient consultation.  Additional time spent with chart review  / charting (studies, outside notes, etc): 17 min Total Time: 45 min   Current medicines are reviewed at length with the patient today.  (+/- concerns) N/A  This visit occurred during the SARS-CoV-2 public health emergency.  Safety protocols  were in place, including screening questions prior to the visit, additional usage of staff PPE, and extensive cleaning of exam room while observing appropriate contact time as indicated for disinfecting solutions.  Notice: This dictation was prepared with Dragon dictation along with smaller phrase technology. Any transcriptional errors that result from this process are unintentional and may not be corrected upon review.  Patient Instructions / Medication Changes & Studies & Tests Ordered   Patient Instructions  Medication Instructions:    Call the office to let us know which dose of Metoprolol you are taking. No other changes  *If you need a refill on your cardiac medications before your next appointment, please call your pharmacy*   Lab Work:  Not needed   Testing/Procedures: Not needed   Follow-Up: At [facility], you and your health needs are our priority.  As part of our continuing mission to provide you with exceptional heart care, we have created designated Provider Care Teams.  These Care Teams include your primary Cardiologist (physician) and Advanced Practice Providers (APPs -  Physician Assistants and Nurse Practitioners) who all work together to provide you with the care you need, when you need it.     Your next appointment:   6 month(s)  The format for your next appointment:   In Person  Provider:   Glenetta Hew, MD    Studies Ordered:   Orders Placed This Encounter  Procedures   EKG 12-Lead      Glenetta Hew, M.D., M.S. Interventional Cardiologist   Pager # [phone] Phone # [phone] [address] [place], [place] [address]   Thank you for choosing Heartcare at [facility]!!

## 2020-08-21 ENCOUNTER — Telehealth: Payer: Self-pay | Admitting: Cardiology

## 2020-08-21 ENCOUNTER — Other Ambulatory Visit: Payer: Self-pay

## 2020-08-21 ENCOUNTER — Encounter: Payer: Self-pay | Admitting: Cardiology

## 2020-08-21 ENCOUNTER — Ambulatory Visit (INDEPENDENT_AMBULATORY_CARE_PROVIDER_SITE_OTHER): Payer: Medicare Other | Admitting: Cardiology

## 2020-08-21 VITALS — BP 128/64 | HR 56 | Ht 67.0 in | Wt 184.0 lb

## 2020-08-21 DIAGNOSIS — Z9989 Dependence on other enabling machines and devices: Secondary | ICD-10-CM | POA: Diagnosis not present

## 2020-08-21 DIAGNOSIS — E785 Hyperlipidemia, unspecified: Secondary | ICD-10-CM

## 2020-08-21 DIAGNOSIS — I5032 Chronic diastolic (congestive) heart failure: Secondary | ICD-10-CM

## 2020-08-21 DIAGNOSIS — N183 Chronic kidney disease, stage 3 unspecified: Secondary | ICD-10-CM

## 2020-08-21 DIAGNOSIS — I249 Acute ischemic heart disease, unspecified: Secondary | ICD-10-CM

## 2020-08-21 DIAGNOSIS — I25709 Atherosclerosis of coronary artery bypass graft(s), unspecified, with unspecified angina pectoris: Secondary | ICD-10-CM

## 2020-08-21 DIAGNOSIS — I480 Paroxysmal atrial fibrillation: Secondary | ICD-10-CM

## 2020-08-21 DIAGNOSIS — Z7901 Long term (current) use of anticoagulants: Secondary | ICD-10-CM

## 2020-08-21 DIAGNOSIS — Z79899 Other long term (current) drug therapy: Secondary | ICD-10-CM

## 2020-08-21 DIAGNOSIS — I25119 Atherosclerotic heart disease of native coronary artery with unspecified angina pectoris: Secondary | ICD-10-CM | POA: Diagnosis not present

## 2020-08-21 DIAGNOSIS — I10 Essential (primary) hypertension: Secondary | ICD-10-CM | POA: Diagnosis not present

## 2020-08-21 DIAGNOSIS — G4733 Obstructive sleep apnea (adult) (pediatric): Secondary | ICD-10-CM | POA: Diagnosis not present

## 2020-08-21 NOTE — Telephone Encounter (Signed)
Pt c/o medication issue:  1. Name of Medication:  metoprolol succinate (TOPROL-XL) 50 MG 24 hr tablet  2. How are you currently taking this medication (dosage and times per day)? 1 tablet daily  3. Are you having a reaction (difficulty breathing--STAT)? no  4. What is your medication issue? Patient's daughter states they were supposed to call the office when they found out which dose of the metoprolol the patient was taking. She states she is taking the 50 mg not the 200 mg. She would like to know if the office needs to contact the pharmacy to inform them the 200 mg prescription was discontinued.

## 2020-08-21 NOTE — Patient Instructions (Signed)
Medication Instructions:    Call the office to let us know which dose of Metoprolol you are taking. No other changes  *If you need a refill on your cardiac medications before your next appointment, please call your pharmacy*   Lab Work:  Not needed   Testing/Procedures: Not needed   Follow-Up: At [facility], you and your health needs are our priority.  As part of our continuing mission to provide you with exceptional heart care, we have created designated Provider Care Teams.  These Care Teams include your primary Cardiologist (physician) and Advanced Practice Providers (APPs -  Physician Assistants and Nurse Practitioners) who all work together to provide you with the care you need, when you need it.     Your next appointment:   6 month(s)  The format for your next appointment:   In Person  Provider:   Glenetta Hew, MD

## 2020-08-23 ENCOUNTER — Encounter: Payer: Self-pay | Admitting: Cardiology

## 2020-08-23 DIAGNOSIS — Z7901 Long term (current) use of anticoagulants: Secondary | ICD-10-CM | POA: Insufficient documentation

## 2020-08-23 NOTE — Assessment & Plan Note (Addendum)
Labs checked in March showed LDL 74.  Not quite at goal, but I am leery of adjusting too much.  She is on high-dose statin.  Only other option will be to add a new medicine.  She is really not wanting to take new medications.  We talked about adjusting diet and trying to pick up her exercise.  Hopefully we can see an improvement.  She may be due for labs recheck soon.  Otherwise will be due in March.  Hopefully when labs are checked, the amiodarone labs will also be checked.

## 2020-08-23 NOTE — Assessment & Plan Note (Signed)
Normal EF but diastolic function, exacerbated by A. fib (noted to have elevated LVEDP on cath and echo).  As long as she is not in A. fib, she seems to doing fairly well.  She has some mild baseline exertional dyspnea but is probably related to combination of factors.  She is on stable dose of enalapril and Toprol.  Euvolemic on exam, not requiring diuretic.  She is less likely to have CHF symptoms unless she is in A. fib.

## 2020-08-23 NOTE — Assessment & Plan Note (Addendum)
Diagnosed during an episode of what sounds like ACS.  Noted to be hypotensive and bradycardic in PCPs office and therefore beta-blocker dose was reduced.  Now currently on 50 mg.  This patients CHA2DS2-VASc Score and unadjusted Ischemic Stroke Rate (% per year) is equal to 9.7 % stroke rate/year from a score of 86 (age x2, female, HTN, CAD, CHF)  Plan:   Continue rhythm control with 100 mg amiodarone (for breakthrough symptoms, double dose)  We clarified which dose of metoprolol she is taking.  She is on the 50 mg Toprol-would not titrate further because of bradycardia.  On Eliquis.  No bleeding issues.

## 2020-08-23 NOTE — Assessment & Plan Note (Signed)
Sleeps well with CPAP.  Tolerating well.

## 2020-08-23 NOTE — Assessment & Plan Note (Addendum)
Both native and graft disease.  Most recent cardiac catheterization was in April 20 21.  Native LCx and D1 are occluded along with OM1.  Graft to D1 is occluded as well.  However the graft to OM 2 and LIMA to LAD are both patent.  Would not need another ischemic evaluation until 2025 unless symptoms warrant.  I suspect that she could easily have anginal symptoms from demand ischemia in setting of A. fib RVR.  At present not currently having anginal symptoms.  Plan:  Not on aspirin or Plavix because of Eliquis.  On stable dose of beta-blocker and amiodarone along with ACE inhibitor.  Still on high-dose atorvastatin-seems to be able to tolerate high-dose. Marland Kitchen

## 2020-08-23 NOTE — Assessment & Plan Note (Signed)
Labs been followed by her in an effort to avoid excess lab draws.  I would therefore asked that we make sure that she has annual LFTs TFTs as well as CRP and sed rate (to evaluate lung toxicity) check.  She also needs annual eye exams.  With her advanced age, unless she has pulmonary symptoms, would not check PFTs.

## 2020-08-23 NOTE — Assessment & Plan Note (Signed)
Most recent creatinine was 1.06.  Relatively stable.

## 2020-08-23 NOTE — Assessment & Plan Note (Addendum)
With PAF and CHA2DS2-VASc of 6, on long-term Eliquis.  Renal function is stable and therefore he on stable dose.   Okay to hold Eliquis 2-3 days preop for surgeries or procedures.  (2 days from Mohs procedures and 3 days for high risk.

## 2020-08-26 ENCOUNTER — Encounter: Payer: Self-pay | Admitting: Dermatology

## 2020-08-26 NOTE — Progress Notes (Signed)
   Follow-Up Visit   Subjective  Stephanie Shea is a 87 y.o. female who presents for the following: Follow-up (3 month follow up on SCC on left neck. ).  Check site of skin cancer left neck, new spot lower front neck Location:  Duration:  Quality:  Associated Signs/Symptoms: Modifying Factors:  Severity:  Timing: Context:   Objective  Well appearing patient in no apparent distress; mood and affect are within normal limits. Neck - Anterior Leave neck, 10 pink flattopped textured 5 mm papule.  Left Anterior Neck CIS left anterior neck treated 3+ months ago; no sign recurrence.    A focused examination was performed including head and neck. Relevant physical exam findings are noted in the Assessment and Plan.   Assessment & Plan    Seborrheic keratosis Neck - Anterior  Leave if stable.  Personal history of skin cancer Left Anterior Neck  Recheck as needed.      I, Lavonna Monarch, MD, have reviewed all documentation for this visit.  The documentation on 08/26/20 for the exam, diagnosis, procedures, and orders are all accurate and complete.

## 2020-09-12 ENCOUNTER — Encounter: Payer: Self-pay | Admitting: Nurse Practitioner

## 2020-09-12 ENCOUNTER — Non-Acute Institutional Stay: Payer: Medicare Other | Admitting: Nurse Practitioner

## 2020-09-12 ENCOUNTER — Other Ambulatory Visit: Payer: Self-pay | Admitting: Nurse Practitioner

## 2020-09-12 ENCOUNTER — Other Ambulatory Visit: Payer: Self-pay

## 2020-09-12 DIAGNOSIS — M159 Polyosteoarthritis, unspecified: Secondary | ICD-10-CM

## 2020-09-12 DIAGNOSIS — I10 Essential (primary) hypertension: Secondary | ICD-10-CM

## 2020-09-12 DIAGNOSIS — K219 Gastro-esophageal reflux disease without esophagitis: Secondary | ICD-10-CM | POA: Diagnosis not present

## 2020-09-12 DIAGNOSIS — I25709 Atherosclerosis of coronary artery bypass graft(s), unspecified, with unspecified angina pectoris: Secondary | ICD-10-CM | POA: Diagnosis not present

## 2020-09-12 DIAGNOSIS — L089 Local infection of the skin and subcutaneous tissue, unspecified: Secondary | ICD-10-CM | POA: Insufficient documentation

## 2020-09-12 DIAGNOSIS — I249 Acute ischemic heart disease, unspecified: Secondary | ICD-10-CM | POA: Diagnosis not present

## 2020-09-12 DIAGNOSIS — I480 Paroxysmal atrial fibrillation: Secondary | ICD-10-CM | POA: Diagnosis not present

## 2020-09-12 DIAGNOSIS — G4733 Obstructive sleep apnea (adult) (pediatric): Secondary | ICD-10-CM | POA: Diagnosis not present

## 2020-09-12 DIAGNOSIS — L989 Disorder of the skin and subcutaneous tissue, unspecified: Secondary | ICD-10-CM | POA: Insufficient documentation

## 2020-09-12 MED ORDER — TRAMADOL HCL 50 MG PO TABS: 50.0000 mg | ORAL_TABLET | Freq: Four times a day (QID) | ORAL | 0 refills | Status: DC | PRN

## 2020-09-12 MED ORDER — DOXYCYCLINE HYCLATE 100 MG PO TABS: 100.0000 mg | ORAL_TABLET | Freq: Two times a day (BID) | ORAL | 0 refills | Status: AC

## 2020-09-12 MED ORDER — MUPIROCIN CALCIUM 2 % EX CREA: 1.0000 "application " | TOPICAL_CREAM | Freq: Every day | CUTANEOUS | 0 refills | Status: DC

## 2020-09-12 NOTE — Assessment & Plan Note (Signed)
stable, Cath 04/25/19, prn NTG, takes Atorvastatin. LDL 74 04/05/20

## 2020-09-12 NOTE — Assessment & Plan Note (Addendum)
Left knee s/p TK, now left hip pain, failed therapy and inj x2, Tylenol is not adequate for pain control. Will try Tramadol '25mg'$  q6hr prn for now. R

## 2020-09-12 NOTE — Assessment & Plan Note (Signed)
Blood pressure is controlled, continue Metoprolol, Enalapril.

## 2020-09-12 NOTE — Assessment & Plan Note (Signed)
Left fore arm.  A small skin lesion injured, base swelling, redness, painful. Will treat with Doxy '100mg'$  bid x 7 days, apply Bactroban ointment daily x 10 days. Observe for possible skin cancer.

## 2020-09-12 NOTE — Progress Notes (Signed)
Location:  clinic [place]   Place of Service:  Clinic (12) Provider: Marlana Latus NP  Code Status: DNR Goals of Care: [place] 09/12/2020  Does Patient Have a Medical Advance Directive? Yes  Type of Paramedic of [place];Living will;Out of facility DNR (pink MOST or yellow form)  Does patient want to make changes to medical advance directive? No - Patient declined  Copy of [place] in Chart? Yes - validated most recent copy scanned in chart (See row information)  Pre-existing out of facility DNR order (yellow form or pink MOST form) Yellow form placed in chart (order not valid for inpatient use);Pink MOST form placed in chart (order not valid for inpatient use)     Chief Complaint  Patient presents with   Acute Visit    Possible infected area on arm     HPI: Patient is a 87 y.o. female seen today for medical management of chronic diseases.    A small skin lesion injured, base swelling, redness, painful  Afib, takes Metoprolol, Eliquis, Amiodarone, f/u cardiology. TSH 1.51 09/05/20  HTN, takes Enalapril, Metoprolol.              GERD, stable, takes Omeprazole '20mg'$  qd.              CAD, stable, Cath 04/25/19, prn NTG, takes Atorvastatin. LDL 74 04/05/20  OA, left knee s/p TKR, now left hip pain, failed inj x2, therapy, Tylenol.   Past Medical History:  Diagnosis Date   Atherosclerosis of native coronary artery 2001   LAD & Cx disease -- BMS PCI in 2001; CABG 2002.  PCI in 2006 (per-op [facility])   Atrial fibrillation and flutter ([place]) 04/2019   New diagnosis-major complaint was chest tightness and dyspnea.   Breast cancer ([place])    Cervix dysplasia    GERD (gastroesophageal reflux disease)    Heart attack ([place]) 2002, 2006   Both events reportedly were perioperatively. She has no recollection. Initial PCI was related to angina symptoms (bilateral arm pain)   High cholesterol    Hypertension    Lumbosacral spinal stenosis     Osteoarthritis    Skin cancer    Sleep apnea    Uses CPAP faithfully   Squamous cell carcinoma of skin 02/12/2020   in situ- left neck-anterior (CX35FU)    Past Surgical History:  Procedure Laterality Date   ABDOMINAL HYSTERECTOMY     bone density     CARDIAC CATHETERIZATION  2001    Dr. Glade Lloyd [facility]) 2 vessel CAD involving LAD-D1 and distal Cx-OM. Appearance of dilated tapered left main with no significant atherosclerosis on IVUS --  BMS PCI of pLAD (Royale BMS 3.0 mm x 15 mm) & with PTCA of ostial D1, PTCA of dLAD.  BMS PCI pCx [facility] BMS 3.5 mm x 11 mm) - unable to reopen dCx-OM (probable distal Atheroembolic occlusion.  Normal LVEF.   COLONOSCOPY     CORONARY ARTERY BYPASS GRAFT  2002   Unsure of details [facility], in [facility])   [place]  2001   ([place]) Royale BMS PCI pLAD (3.0 mm x 15 mm) -PTCA of jailed D1 as well as distal LAD; pCx (Royale BMS 3.5 mm x 11 mm) -unable to open distalCx-OM -thromboembolic occlusion   DIAGNOSTIC MAMMOGRAM     LEFT HEART CATH AND CORS/GRAFTS ANGIOGRAPHY N/A 04/25/2019   Procedure: LEFT HEART CATH AND CORS/GRAFTS ANGIOGRAPHY;  Surgeon: Troy Sine, MD;  [facility]  [facility];;; ost-prox LCx stent @ OM1 ostium =100% CTO, Ost-prox LAD stent ~30% ISR, Ost D1 CTO, prox RCA ~30%. SCG-OM2 patent with ostial stent 30%, LIMA-LAD patent; CTO of SVG-D1.    left knee replacement     MASTECTOMY     NM MYOVIEW LTD  12/2014; 2018   Cardiologist (Dr. Kellie Simmering.  [place], [place] ph# [phone]) -->LEXISCAN: Nonischemic, "normal "   pap smear     tonsilectomy     TRANSTHORACIC ECHOCARDIOGRAM  12/2014   Echocardiogram 12/25/14- normal LVEF and systolic function, EF 99991111, mild grade 1 diastolic dysfunction   TRANSTHORACIC ECHOCARDIOGRAM  04/24/2019   EF 65 to 70%.  Moderate septal LVH.  GR 1 DD-elevated EDP.  Normal PAP.  Relatively normal valves.  No aortic stenosis.    Allergies  Allergen Reactions   Codeine Nausea  Only    Allergies as of 09/12/2020       Reactions   Codeine Nausea Only        Medication List        Accurate as of September 12, 2020 11:59 PM. If you have any questions, ask your nurse or doctor.          acetaminophen 500 MG tablet Commonly known as: TYLENOL Take 500 mg by mouth at bedtime as needed (for mild pain and "sleep").   amiodarone 200 MG tablet Commonly known as: PACERONE Take 0.5 tablets (100 mg total) by mouth daily.   ammonium lactate 12 % cream Commonly known as: AMLACTIN Apply topically as needed for dry skin.   atorvastatin 80 MG tablet Commonly known as: LIPITOR Take 1 tablet (80 mg total) by mouth daily.   doxycycline 100 MG tablet Commonly known as: VIBRA-TABS Take 1 tablet (100 mg total) by mouth 2 (two) times daily for 7 days. Started by: Eliah Marquard X Frenchie Dangerfield, NP   Eliquis 5 MG Tabs tablet Generic drug: apixaban Take 1 tablet (5 mg total) by mouth 2 (two) times daily.   enalapril 10 MG tablet Commonly known as: VASOTEC Take 1 tablet (10 mg total) by mouth daily.   fexofenadine 180 MG tablet Commonly known as: ALLEGRA Take 180 mg by mouth daily as needed for allergies or rhinitis.   fluticasone 50 MCG/ACT nasal spray Commonly known as: FLONASE Place 1 spray into both nostrils daily as needed for allergies or rhinitis.   metoprolol succinate 50 MG 24 hr tablet Commonly known as: TOPROL-XL TAKE 1 TABLET BY MOUTH EVERY DAY   mupirocin ointment 2 % Commonly known as: BACTROBAN Apply topically daily for 10 days. Started by: Oslo Huntsman X Kattia Selley, NP   nitroGLYCERIN 0.4 MG SL tablet Commonly known as: NITROSTAT PLACE 1 TABLET UNDER TONGUE EVERY 5 MINUTES FOR 3 DOSES FOR CHEST PAIN   omeprazole 20 MG tablet Commonly known as: PRILOSEC OTC Take 20 mg by mouth daily before breakfast.   Shingrix injection Generic drug: Zoster Vaccine Adjuvanted   traMADol 50 MG tablet Commonly known as: Ultram Take 1 tablet (50 mg total) by mouth every 6 (six)  hours as needed. Started by: Cele Mote X Sereen Schaff, NP   Vitamin D3 50 MCG (2000 UT) Tabs Take 2,000 Units by mouth daily after breakfast.   WHEAT DEXTRIN PO Take by mouth. Take 2 teaspoons full in to 8 oz of water or any drink by mouth once daily        Review of Systems:  Review of Systems  Constitutional:  Negative for activity change, appetite change and fever.  HENT:  Negative for congestion, hearing loss and voice change.   Respiratory:  Negative for cough, chest tightness, shortness of breath and wheezing.   Cardiovascular:  Negative for leg swelling.  Gastrointestinal:  Negative for abdominal pain and constipation.  Genitourinary:  Negative for difficulty urinating, dysuria and urgency.  Musculoskeletal:  Positive for arthralgias and gait problem.       S/p L TKR, pain is resolved right knee OA s/p inj, relapsed eft hip pain  f/u Ortho, injx2  Skin:        Left fore arm, a small skin lesion injured, base swelling, redness, painful   Neurological:  Negative for dizziness, speech difficulty and headaches.       Memory lapses.   Hematological:  Bruises/bleeds easily.  Psychiatric/Behavioral:  Negative for agitation, behavioral problems and sleep disturbance.    Health Maintenance  Topic Date Due   OPHTHALMOLOGY EXAM  Never done   TETANUS/TDAP  Never done   Zoster Vaccines- Shingrix (1 of 2) Never done   PNA vac Low Risk Adult (2 of 2 - PCV13) 01/12/2017   COVID-19 Vaccine (3 - Moderna risk series) 03/13/2019   HEMOGLOBIN A1C  12/01/2019   FOOT EXAM  12/13/2019   INFLUENZA VACCINE  08/12/2020   DEXA SCAN  Completed   HPV VACCINES  Aged Out    Physical Exam: Vitals:   09/12/20 1501  BP: 118/60  Pulse: (!) 52  Resp: 16  Temp: (!) 97.5 F (36.4 C)  SpO2: 94%  Weight: 182 lb 3.2 oz (82.6 kg)  Height: '5\' 7"'$  (1.702 m)   Body mass index is 28.54 kg/m. Physical Exam Constitutional:      Appearance: Normal appearance.  HENT:     Head: Normocephalic and atraumatic.      Nose: Nose normal.     Mouth/Throat:     Mouth: Mucous membranes are dry.  Eyes:     Extraocular Movements: Extraocular movements intact.     Pupils: Pupils are equal, round, and reactive to light.  Cardiovascular:     Rate and Rhythm: Regular rhythm. Bradycardia present.     Heart sounds: Murmur heard.     Comments: HR 52 bpm Pulmonary:     Effort: Pulmonary effort is normal.     Breath sounds: Normal breath sounds. No wheezing or rales.  Abdominal:     General: Bowel sounds are normal.     Palpations: Abdomen is soft.  Musculoskeletal:     Cervical back: Normal range of motion and neck supple.     Right lower leg: No edema.     Left lower leg: No edema.  Skin:    General: Skin is warm and dry.     Comments: Right groin area. Lateral left great toe nail bed irritation from pedicure, no redness, open wound, warmth, or swelling, open toe shoes should be helpful.   Neurological:     General: No focal deficit present.     Mental Status: She is alert and oriented to person, place, and time.     Motor: No weakness.     Coordination: Coordination normal.     Gait: Gait abnormal.  Psychiatric:        Mood and Affect: Mood normal.        Behavior: Behavior normal.        Thought Content: Thought content normal.        Judgment: Judgment normal.    Labs reviewed: Basic Metabolic Panel: Recent Labs  04/05/20 0000  NA 142  K 4.5  CL 101  CO2 24  GLUCOSE 101*  BUN 15  CREATININE 1.06*  CALCIUM 10.0  TSH 1.510   Liver Function Tests: Recent Labs    04/05/20 0000  AST 31  ALT 40*  ALKPHOS 54  BILITOT 1.0  PROT 7.5  ALBUMIN 4.4   No results for input(s): LIPASE, AMYLASE in the last 8760 hours. No results for input(s): AMMONIA in the last 8760 hours. CBC: Recent Labs    04/05/20 0000  WBC 5.4  HGB 13.3  HCT 39.9  MCV 98*  PLT 215   Lipid Panel: Recent Labs    04/05/20 0000  CHOL 169  HDL 70  LDLCALC 74  TRIG 150*  CHOLHDL 2.4   Lab Results   Component Value Date   HGBA1C 5.9 (H) 05/31/2019    Procedures since last visit: No results found.  Assessment/Plan  Essential hypertension Blood pressure is controlled, continue Metoprolol, Enalapril.   Paroxysmal atrial fibrillation/flutter (HCC) Heart rate is controlled, continue Metoprolol, Amiodarone, Eliquis.   Gastroesophageal reflux disease stable, takes Omeprazole '20mg'$  qd.   Coronary artery disease involving coronary bypass graft of native heart with angina pectoris ([place])  stable, Cath 04/25/19, prn NTG, takes Atorvastatin. LDL 74 04/05/20  Osteoarthritis of multiple joints Left knee s/p TK, now left hip pain, failed therapy and inj x2, Tylenol is not adequate for pain control. Will try Tramadol '25mg'$  q6hr prn for now. R  Infected skin lesion Left fore arm.  A small skin lesion injured, base swelling, redness, painful. Will treat with Doxy '100mg'$  bid x 7 days, apply Bactroban ointment daily x 10 days. Observe for possible skin cancer.    Labs/tests ordered:  none  Next appt: 4 weeks.

## 2020-09-12 NOTE — Assessment & Plan Note (Signed)
Heart rate is controlled, continue Metoprolol, Amiodarone, Eliquis.

## 2020-09-12 NOTE — Assessment & Plan Note (Signed)
stable, takes Omeprazole 20mg  qd.

## 2020-09-13 ENCOUNTER — Encounter: Payer: Self-pay | Admitting: Nurse Practitioner

## 2020-09-25 ENCOUNTER — Encounter: Payer: Self-pay | Admitting: Nurse Practitioner

## 2020-09-25 ENCOUNTER — Non-Acute Institutional Stay: Payer: Medicare Other | Admitting: Nurse Practitioner

## 2020-09-25 ENCOUNTER — Other Ambulatory Visit: Payer: Self-pay

## 2020-09-25 DIAGNOSIS — M159 Polyosteoarthritis, unspecified: Secondary | ICD-10-CM

## 2020-09-25 DIAGNOSIS — L989 Disorder of the skin and subcutaneous tissue, unspecified: Secondary | ICD-10-CM | POA: Diagnosis not present

## 2020-09-25 DIAGNOSIS — I480 Paroxysmal atrial fibrillation: Secondary | ICD-10-CM

## 2020-09-25 DIAGNOSIS — I739 Peripheral vascular disease, unspecified: Secondary | ICD-10-CM | POA: Diagnosis not present

## 2020-09-25 DIAGNOSIS — I10 Essential (primary) hypertension: Secondary | ICD-10-CM | POA: Diagnosis not present

## 2020-09-25 DIAGNOSIS — K219 Gastro-esophageal reflux disease without esophagitis: Secondary | ICD-10-CM

## 2020-09-25 DIAGNOSIS — I249 Acute ischemic heart disease, unspecified: Secondary | ICD-10-CM

## 2020-09-25 DIAGNOSIS — I25709 Atherosclerosis of coronary artery bypass graft(s), unspecified, with unspecified angina pectoris: Secondary | ICD-10-CM | POA: Diagnosis not present

## 2020-09-25 NOTE — Assessment & Plan Note (Signed)
A small marble sized skin lesion with center umbilical appearance, slightly redness at the base of the skin lesion, no warmth. Slightly better in redness ans warmth after 7 day course of Doxycycline and topical Bactroban.  HPOA will make an appointment with dermatology.

## 2020-09-25 NOTE — Assessment & Plan Note (Signed)
Blood pressure is controlled, akes Enalapril, Metoprolol.

## 2020-09-25 NOTE — Assessment & Plan Note (Signed)
stable, takes Omeprazole 20mg  qd.

## 2020-09-25 NOTE — Progress Notes (Signed)
Location:   clinic [place]   Place of Service:   clinic [place] Provider: Marlana Latus NP  Code Status: DNR Goals of Care: [place] 09/12/2020  Does Patient Have a Medical Advance Directive? Yes  Type of Paramedic of [place];Living will;Out of facility DNR (pink MOST or yellow form)  Does patient want to make changes to medical advance directive? No - Patient declined  Copy of [place] in Chart? Yes - validated most recent copy scanned in chart (See row information)  Pre-existing out of facility DNR order (yellow form or pink MOST form) Yellow form placed in chart (order not valid for inpatient use);Pink MOST form placed in chart (order not valid for inpatient use)     Chief Complaint  Patient presents with   Acute Visit    Left fore arm skin lesion.     HPI: Patient is a 87 y.o. Shea seen today for medical management of chronic diseases.    A small marble sized skin lesion with center umbilical appearance, slightly redness at the base of the skin lesion, no warmth.              Afib, takes Metoprolol, Eliquis, Amiodarone, f/u cardiology. TSH 1.Stephanie 09/05/20             HTN, takes Enalapril, Metoprolol.              GERD, stable, takes Omeprazole '20mg'$  qd.              CAD, stable, Cath 04/25/19, prn NTG, takes Atorvastatin. LDL 74 04/05/20             OA, left knee s/p TKR, now left hip pain, failed inj x2, therapy, Tylenol.   Past Medical History:  Diagnosis Date   Atherosclerosis of native coronary artery 2001   LAD & Cx disease -- BMS PCI in 2001; CABG 2002.  PCI in 2006 (per-op TAH)   Atrial fibrillation and flutter ([place]) 04/2019   New diagnosis-major complaint was chest tightness and dyspnea.   Breast cancer ([place])    Cervix dysplasia    GERD (gastroesophageal reflux disease)    Heart attack ([place]) 2002, 2006   Both events reportedly were perioperatively. She has no recollection. Initial PCI was related to angina symptoms  (bilateral arm pain)   High cholesterol    Hypertension    Lumbosacral spinal stenosis    Osteoarthritis    Skin cancer    Sleep apnea    Uses CPAP faithfully   Squamous cell carcinoma of skin 02/12/2020   in situ- left neck-anterior (CX35FU)    Past Surgical History:  Procedure Laterality Date   ABDOMINAL HYSTERECTOMY     bone density     CARDIAC CATHETERIZATION  2001    Dr. Glade Lloyd [facility]) 2 vessel CAD involving LAD-D1 and distal Cx-OM. Appearance of dilated tapered left main with no significant atherosclerosis on IVUS --  BMS PCI of pLAD (Royale BMS 3.0 mm x 15 mm) & with PTCA of ostial D1, PTCA of dLAD.  BMS PCI pCx [facility] BMS 3.5 mm x 11 mm) - unable to reopen dCx-OM (probable distal Atheroembolic occlusion.  Normal LVEF.   COLONOSCOPY     CORONARY ARTERY BYPASS GRAFT  2002   Unsure of details [facility] in [facility])   [place]  2001   ([place]) Royale BMS PCI pLAD (3.0 mm x 15 mm) -PTCA of jailed D1 as well as distal  LAD; pCx ([facility]ale BMS 3.5 mm x 11 mm) -unable to open distalCx-OM -thromboembolic occlusion   DIAGNOSTIC MAMMOGRAM     LEFT HEART CATH AND CORS/GRAFTS ANGIOGRAPHY N/A 04/25/2019   Procedure: LEFT HEART CATH AND CORS/GRAFTS ANGIOGRAPHY;  Surgeon: Troy Sine, MD;  [facility];;; ost-prox LCx stent @ OM1 ostium =100% CTO, Ost-prox LAD stent ~30% ISR, Ost D1 CTO, prox RCA ~30%. SCG-OM2 patent with ostial stent 30%, LIMA-LAD patent; CTO of SVG-D1.    left knee replacement     MASTECTOMY     NM MYOVIEW LTD  12/2014; 2018   Cardiologist (Dr. Kellie Simmering.  [place], [place] ph# [phone]) -->LEXISCAN: Nonischemic, "normal "   pap smear     tonsilectomy     TRANSTHORACIC ECHOCARDIOGRAM  12/2014   Echocardiogram 12/25/14- normal LVEF and systolic function, EF 99991111, mild grade 1 diastolic dysfunction   TRANSTHORACIC ECHOCARDIOGRAM  04/24/2019   EF 65 to 70%.  Moderate septal LVH.  GR 1 DD-elevated EDP.  Normal PAP.  Relatively  normal valves.  No aortic stenosis.    Allergies  Allergen Reactions   Codeine Nausea Only    Allergies as of 09/25/2020       Reactions   Codeine Nausea Only        Medication List        Accurate as of September 25, 2020  3:35 PM. If you have any questions, ask your nurse or doctor.          acetaminophen 500 MG tablet Commonly known as: TYLENOL Take 500 mg by mouth at bedtime as needed (for mild pain and "sleep").   amiodarone 200 MG tablet Commonly known as: PACERONE Take 0.5 tablets (100 mg total) by mouth daily.   ammonium lactate 12 % cream Commonly known as: AMLACTIN Apply topically as needed for dry skin.   atorvastatin 80 MG tablet Commonly known as: LIPITOR Take 1 tablet (80 mg total) by mouth daily.   Eliquis 5 MG Tabs tablet Generic drug: apixaban Take 1 tablet (5 mg total) by mouth 2 (two) times daily.   enalapril 10 MG tablet Commonly known as: VASOTEC Take 1 tablet (10 mg total) by mouth daily.   fexofenadine 180 MG tablet Commonly known as: ALLEGRA Take 180 mg by mouth daily as needed for allergies or rhinitis.   fluticasone 50 MCG/ACT nasal spray Commonly known as: FLONASE Place 1 spray into both nostrils daily as needed for allergies or rhinitis.   metoprolol succinate 50 MG 24 hr tablet Commonly known as: TOPROL-XL TAKE 1 TABLET BY MOUTH EVERY DAY   nitroGLYCERIN 0.4 MG SL tablet Commonly known as: NITROSTAT PLACE 1 TABLET UNDER TONGUE EVERY 5 MINUTES FOR 3 DOSES FOR CHEST PAIN   omeprazole 20 MG tablet Commonly known as: PRILOSEC OTC Take 20 mg by mouth daily before breakfast.   Shingrix injection Generic drug: Zoster Vaccine Adjuvanted   traMADol 50 MG tablet Commonly known as: Ultram Take 1 tablet (50 mg total) by mouth every 6 (six) hours as needed.   Vitamin D3 50 MCG (2000 UT) Tabs Take 2,000 Units by mouth daily after breakfast.   WHEAT DEXTRIN PO Take by mouth. Take 2 teaspoons full in to 8 oz of water or any  drink by mouth once daily        Review of Systems:  Review of Systems  Constitutional:  Negative for activity change, appetite change and fever.  HENT:  Negative for congestion, hearing loss and voice change.  Respiratory:  Negative for cough, chest tightness, shortness of breath and wheezing.   Cardiovascular:  Positive for leg swelling.  Gastrointestinal:  Negative for abdominal pain and constipation.  Genitourinary:  Negative for difficulty urinating, dysuria and urgency.  Musculoskeletal:  Positive for arthralgias and gait problem.       S/p L TKR,  eft hip pain  f/u Ortho, injx2. Pain is resolved right knee OA s/p inj,  Skin:        Left fore arm, a small marble sized kin lesion, ulcerated center, slightly redness at the base, no warmth.    Neurological:  Negative for dizziness, speech difficulty and headaches.       Memory lapses.   Hematological:  Bruises/bleeds easily.  Psychiatric/Behavioral:  Negative for agitation, behavioral problems and sleep disturbance.    Health Maintenance  Topic Date Due   OPHTHALMOLOGY EXAM  Never done   TETANUS/TDAP  Never done   Zoster Vaccines- Shingrix (1 of 2) Never done   PNA vac Low Risk Adult (2 of 2 - PCV13) 01/12/2017   COVID-19 Vaccine (3 - Moderna risk series) 03/13/2019   HEMOGLOBIN A1C  12/01/2019   FOOT EXAM  12/13/2019   INFLUENZA VACCINE  08/12/2020   DEXA SCAN  Completed   HPV VACCINES  Aged Out    Physical Exam: Vitals:   09/25/20 1404  BP: 118/66  Pulse: (!) 54  Resp: 16  Temp: 98.2 F (36.8 C)  SpO2: 95%   There is no height or weight on file to calculate BMI. Physical Exam Constitutional:      Appearance: Normal appearance.  HENT:     Head: Normocephalic and atraumatic.     Nose: Nose normal.     Mouth/Throat:     Mouth: Mucous membranes are dry.  Eyes:     Extraocular Movements: Extraocular movements intact.     Pupils: Pupils are equal, round, and reactive to light.  Cardiovascular:     Rate and  Rhythm: Regular rhythm. Bradycardia present.     Heart sounds: Murmur heard.     Comments: HR 54bpm. Weak DP pulse L, no felt DP pulse R, dependent rubor, cool to touch R+L feet.  Pulmonary:     Effort: Pulmonary effort is normal.     Breath sounds: Normal breath sounds. No wheezing or rales.  Abdominal:     General: Bowel sounds are normal.     Palpations: Abdomen is soft.  Musculoskeletal:        General: Tenderness present.     Cervical back: Normal range of motion and neck supple.     Right lower leg: Edema present.     Left lower leg: Edema present.     Comments: Trace BLE. Left hip/groin pain when getting up from sitting.   Skin:    General: Skin is warm and dry.     Comments: Left fore arm, a small marble sized kin lesion, ulcerated center, slightly redness at the base, no warmth. BLE cool to touch, dependent rubor noted. Mild venous insufficiency skin changes above ankles   Neurological:     General: No focal deficit present.     Mental Status: She is alert and oriented to person, place, and time.     Motor: No weakness.     Coordination: Coordination normal.     Gait: Gait abnormal.  Psychiatric:        Mood and Affect: Mood normal.        Behavior: Behavior normal.  Thought Content: Thought content normal.        Judgment: Judgment normal.    Labs reviewed: Basic Metabolic Panel: Recent Labs    04/05/20 0000  NA 142  K 4.5  CL 101  CO2 24  GLUCOSE 101*  BUN 15  CREATININE 1.06*  CALCIUM 10.0  TSH 1.510   Liver Function Tests: Recent Labs    04/05/20 0000  AST 31  ALT 40*  ALKPHOS 54  BILITOT 1.0  PROT 7.5  ALBUMIN 4.4   No results for input(s): LIPASE, AMYLASE in the last 8760 hours. No results for input(s): AMMONIA in the last 8760 hours. CBC: Recent Labs    04/05/20 0000  WBC 5.4  HGB 13.3  HCT 39.9  MCV 98*  PLT 215   Lipid Panel: Recent Labs    04/05/20 0000  CHOL 169  HDL 70  LDLCALC 74  TRIG 150*  CHOLHDL 2.4   Lab  Results  Component Value Date   HGBA1C 5.9 (H) 05/31/2019    Procedures since last visit: No results found.  Assessment/Plan  Insufficiency, arterial, peripheral ([place]) Noted, the patient declined ABI 05/2017. Weak DP pulse L, no felt DP pulse R, dependent rubor, cool to touch R+L feet. F/u Cardiology   Skin lesion A small marble sized skin lesion with center umbilical appearance, slightly redness at the base of the skin lesion, no warmth. Slightly better in redness ans warmth after 7 day course of Doxycycline and topical Bactroban.  HPOA will make an appointment with dermatology.   Paroxysmal atrial fibrillation/flutter (HCC) takes Metoprolol, Eliquis, Amiodarone, f/u cardiology. TSH 1.Stephanie 09/05/20  Essential hypertension Blood pressure is controlled, akes Enalapril, Metoprolol.   Gastroesophageal reflux disease stable, takes Omeprazole '20mg'$  qd.   Coronary artery disease involving coronary bypass graft of native heart with angina pectoris (HCC) stable, Cath 04/25/19, prn NTG, takes Atorvastatin. LDL 74 04/05/20  Osteoarthritis of multiple joints left knee s/p TKR, now left hip pain, failed inj x2, therapy, Tylenol.    Labs/tests ordered: none  Next appt:  10/17/2020

## 2020-09-25 NOTE — Assessment & Plan Note (Signed)
stable, Cath 04/25/19, prn NTG, takes Atorvastatin. LDL 74 04/05/20

## 2020-09-25 NOTE — Assessment & Plan Note (Addendum)
Noted, the patient declined ABI 05/2017. Weak DP pulse L, no felt DP pulse R, dependent rubor, cool to touch R+L feet. F/u Cardiology

## 2020-09-25 NOTE — Assessment & Plan Note (Signed)
left knee s/p TKR, now left hip pain, failed inj x2, therapy, Tylenol.

## 2020-09-25 NOTE — Assessment & Plan Note (Signed)
takes Metoprolol, Eliquis, Amiodarone, f/u cardiology. TSH 1.51 09/05/20

## 2020-09-26 ENCOUNTER — Ambulatory Visit (INDEPENDENT_AMBULATORY_CARE_PROVIDER_SITE_OTHER): Payer: Medicare Other | Admitting: Dermatology

## 2020-09-26 ENCOUNTER — Encounter: Payer: Self-pay | Admitting: Dermatology

## 2020-09-26 ENCOUNTER — Other Ambulatory Visit: Payer: Self-pay

## 2020-09-26 DIAGNOSIS — C44629 Squamous cell carcinoma of skin of left upper limb, including shoulder: Secondary | ICD-10-CM | POA: Diagnosis not present

## 2020-09-26 DIAGNOSIS — D485 Neoplasm of uncertain behavior of skin: Secondary | ICD-10-CM

## 2020-09-26 NOTE — Patient Instructions (Signed)

## 2020-09-30 ENCOUNTER — Other Ambulatory Visit: Payer: Self-pay | Admitting: Cardiology

## 2020-09-30 DIAGNOSIS — M545 Low back pain, unspecified: Secondary | ICD-10-CM | POA: Diagnosis not present

## 2020-09-30 NOTE — Telephone Encounter (Signed)
Prescription refill request for Eliquis received. Indication:atrial fib Last office visit:8/22 Scr:1.0 Age: 87 Weight:82.6 kg  Prescription refilled

## 2020-10-08 ENCOUNTER — Encounter: Payer: Self-pay | Admitting: Dermatology

## 2020-10-08 NOTE — Progress Notes (Signed)
   Follow-Up Visit   Subjective  Stephanie Shea is a 87 y.o. female who presents for the following: Skin Problem (New lesion on left arm x 1 month- PCP at [facility]  gave Mupirocin ointment- no help/Daughter carrie in room with patient).  Rapid growth of painful nodule left arm, no relief with topical mupirocin Location:  Duration:  Quality:  Associated Signs/Symptoms: Modifying Factors:  Severity:  Timing: Context:   Objective  Well appearing patient in no apparent distress; mood and affect are within normal limits. Left Forearm - Posterior Inflamed volcano-like centrally eroded 1.3 cm keratotic nodule typical of KA/SCCA         A focused examination was performed including left arm. Relevant physical exam findings are noted in the Assessment and Plan.   Assessment & Plan    Squamous cell carcinoma of skin of left upper limb, including shoulder Left Forearm - Posterior  Skin / nail biopsy Type of biopsy: tangential   Informed consent: discussed and consent obtained   Timeout: patient name, date of birth, surgical site, and procedure verified   Procedure prep:  Patient was prepped and draped in usual sterile fashion (Non sterile) Prep type:  Chlorhexidine Anesthesia: the lesion was anesthetized in a standard fashion   Anesthetic:  1% lidocaine w/ epinephrine 1-100,000 local infiltration Instrument used: flexible razor blade   Outcome: patient tolerated procedure well   Post-procedure details: wound care instructions given    Destruction of lesion Complexity: simple   Destruction method: electrodesiccation and curettage   Informed consent: discussed and consent obtained   Timeout:  patient name, date of birth, surgical site, and procedure verified Anesthesia: the lesion was anesthetized in a standard fashion   Anesthetic:  1% lidocaine w/ epinephrine 1-100,000 local infiltration Curettage performed in three different directions: Yes    Electrodesiccation performed over the curetted area: Yes   Curettage cycles:  3 Lesion length (cm):  1.5 Lesion width (cm):  1.5 Margin per side (cm):  0 Final wound size (cm):  1.5 Hemostasis achieved with:  ferric subsulfate and electrodesiccation Outcome: patient tolerated procedure well with no complications   Post-procedure details: wound care instructions given    Specimen 1 - Surgical pathology Differential Diagnosis: R/O KA - treated after biopsy Two specimens in one bottle Check Margins: No  After shave biopsy deep keratinous extension was noted; triple curettage plus cautery.      I, Lavonna Monarch, MD, have reviewed all documentation for this visit.  The documentation on 10/08/20 for the exam, diagnosis, procedures, and orders are all accurate and complete.

## 2020-10-17 ENCOUNTER — Non-Acute Institutional Stay: Payer: Medicare Other | Admitting: Nurse Practitioner

## 2020-10-17 ENCOUNTER — Encounter: Payer: Self-pay | Admitting: Nurse Practitioner

## 2020-10-17 ENCOUNTER — Other Ambulatory Visit: Payer: Self-pay

## 2020-10-17 DIAGNOSIS — Z85828 Personal history of other malignant neoplasm of skin: Secondary | ICD-10-CM

## 2020-10-17 DIAGNOSIS — R7303 Prediabetes: Secondary | ICD-10-CM

## 2020-10-17 DIAGNOSIS — I10 Essential (primary) hypertension: Secondary | ICD-10-CM

## 2020-10-17 DIAGNOSIS — M159 Polyosteoarthritis, unspecified: Secondary | ICD-10-CM | POA: Diagnosis not present

## 2020-10-17 DIAGNOSIS — I249 Acute ischemic heart disease, unspecified: Secondary | ICD-10-CM

## 2020-10-17 DIAGNOSIS — K219 Gastro-esophageal reflux disease without esophagitis: Secondary | ICD-10-CM

## 2020-10-17 DIAGNOSIS — I480 Paroxysmal atrial fibrillation: Secondary | ICD-10-CM

## 2020-10-17 DIAGNOSIS — E785 Hyperlipidemia, unspecified: Secondary | ICD-10-CM

## 2020-10-17 DIAGNOSIS — I25709 Atherosclerosis of coronary artery bypass graft(s), unspecified, with unspecified angina pectoris: Secondary | ICD-10-CM | POA: Diagnosis not present

## 2020-10-17 NOTE — Assessment & Plan Note (Signed)
stable, takes Omeprazole 20mg  qd.

## 2020-10-17 NOTE — Assessment & Plan Note (Signed)
Heart rate is in control, takes Metoprolol, Eliquis, Amiodarone, f/u cardiology. TSH 1.51 09/05/20

## 2020-10-17 NOTE — Assessment & Plan Note (Signed)
Update lipid panel.  

## 2020-10-17 NOTE — Assessment & Plan Note (Addendum)
Podiatrist for foot exam, toe nail care, f/u Hgb a1c

## 2020-10-17 NOTE — Assessment & Plan Note (Signed)
Blood pressure is controlled,  takes Enalapril, Metoprolol.

## 2020-10-17 NOTE — Patient Instructions (Signed)
Return in 3 months with fasting labs prior

## 2020-10-17 NOTE — Assessment & Plan Note (Signed)
A small marble sized skin lesion with center umbilical appearance, slightly redness at the base of the skin lesion, SCC was diagnosed 09/26/20, electrodesiccation and curettage done.

## 2020-10-17 NOTE — Assessment & Plan Note (Signed)
left knee s/p TKR, now left hip pain, failed inj x2, therapy, Tylenol.

## 2020-10-17 NOTE — Assessment & Plan Note (Signed)
stable, Cath 04/25/19, prn NTG, takes Atorvastatin. LDL 74 04/05/20

## 2020-10-17 NOTE — Progress Notes (Signed)
Location:   clinic [place]   Place of Service:   clinic [place] Provider: Marlana Latus NP  Code Status: DNR Goals of Care: [place] 10/17/2020  Does Patient Have a Medical Advance Directive? Yes  Type of Paramedic of [place];Out of facility DNR (pink MOST or yellow form)  Does patient want to make changes to medical advance directive? No - Patient declined  Copy of [place] in Chart? Yes - validated most recent copy scanned in chart (See row information)  Pre-existing out of facility DNR order (yellow form or pink MOST form) Yellow form placed in chart (order not valid for inpatient use);Pink MOST form placed in chart (order not valid for inpatient use)     Chief Complaint  Patient presents with   Follow-up    5 week follow-up. Discuss need for eye exam, td/tdap, shingrix, covid, A1c, and flu vaccine, or postpone/exclude if patient refuses. Foot exam today. Left hip pain     HPI: Patient is a 87 y.o. female seen today for medical management of chronic diseases.     A small marble sized skin lesion with center umbilical appearance, slightly redness at the base of the skin lesion, SCC was diagnosed 09/26/20, electrodesiccation and curettage done.              Afib, takes Metoprolol, Eliquis, Amiodarone, f/u cardiology. TSH 1.51 09/05/20             HTN, takes Enalapril, Metoprolol.              GERD, stable, takes Omeprazole 3m qd.              CAD, stable, Hx of CABG Cath 04/25/19, prn NTG, takes Atorvastatin. LDL 74 04/05/20             OA, left knee s/p TKR, now left hip pain, failed inj x2, therapy, Tylenol.    Past Medical History:  Diagnosis Date   Atherosclerosis of native coronary artery 2001   LAD & Cx disease -- BMS PCI in 2001; CABG 2002.  PCI in 2006 (per-op [facility])   [place]   New diagnosis-major complaint was chest tightness and dyspnea.   Breast cancer ([place]    Cervix dysplasia     GERD (gastroesophageal reflux disease)    Heart attack [place] 2002, 2006   Both events reportedly were perioperatively. She has no recollection. Initial PCI was related to angina symptoms (bilateral arm pain)   High cholesterol    Hypertension    Lumbosacral spinal stenosis    Osteoarthritis    Skin cancer    Sleep apnea    Uses CPAP faithfully   Squamous cell carcinoma of skin 02/12/2020   in situ- left neck-anterior (CX35FU)    Past Surgical History:  Procedure Laterality Date   ABDOMINAL HYSTERECTOMY     bone density     CARDIAC CATHETERIZATION  2001    Dr. TGlade Lloyd([facility] 2 vessel CAD involving LAD-D1 and distal Cx-OM. Appearance of dilated tapered left main with no significant atherosclerosis on IVUS --  BMS PCI of pLAD ([facility] BMS 3.0 mm x 15 mm) & with PTCA of ostial D1, PTCA of dLAD.  [facility]) - unable to reopen dCx-OM (probable distal Atheroembolic occlusion.  Normal LVEF.   COLONOSCOPY     CORONARY ARTERY BYPASS GRAFT  2002   Unsure of details ([facility])   CORONARY STENT INTERVENTION  2001   ([place]) [facility] BMS PCI pLAD (3.0 mm x 15 mm) -PTCA of jailed D1 as well as distal LAD; pCx ([facility] BMS 3.5 mm x 11 mm) -unable to open distalCx-OM -thromboembolic occlusion   DIAGNOSTIC MAMMOGRAM     LEFT HEART CATH AND CORS/GRAFTS ANGIOGRAPHY N/A 04/25/2019   Procedure: LEFT HEART CATH AND CORS/GRAFTS ANGIOGRAPHY;  Surgeon: Troy Sine, MD;  Location: [facility] IN[facility] LAB;;; ost-prox LCx stent @ OM1 ostium =100% CTO, Ost-prox LAD stent ~30% ISR, Ost D1 CTO, prox RCA ~30%. SCG-OM2 patent with ostial stent 30%, LIMA-LAD patent; CTO of SVG-D1.    left knee replacement     MASTECTOMY     NM MYOVIEW LTD  12/2014; 2018   Cardiologist (Dr. Kellie Simmering.  [place], [place] ph# [phone]) -->LEXISCAN: Nonischemic, "normal "   pap smear     tonsilectomy     TRANSTHORACIC ECHOCARDIOGRAM  12/2014   Echocardiogram 12/25/14- normal LVEF and  systolic function, EF 03-54%, mild grade 1 diastolic dysfunction   TRANSTHORACIC ECHOCARDIOGRAM  04/24/2019   EF 65 to 70%.  Moderate septal LVH.  GR 1 DD-elevated EDP.  Normal PAP.  Relatively normal valves.  No aortic stenosis.    Allergies  Allergen Reactions   Codeine Nausea Only    Allergies as of 10/17/2020       Reactions   Codeine Nausea Only        Medication List        Accurate as of October 17, 2020 11:59 PM. If you have any questions, ask your nurse or doctor.          STOP taking these medications    mupirocin ointment 2 % Commonly known as: BACTROBAN Stopped by: Gearline Spilman X Ernst Cumpston, NP   Shingrix injection Generic drug: Zoster Vaccine Adjuvanted Stopped by: Lynnann Knudsen X Merric Yost, NP       TAKE these medications    acetaminophen 500 MG tablet Commonly known as: TYLENOL Take 500 mg by mouth at bedtime as needed (for mild pain and "sleep").   amiodarone 200 MG tablet Commonly known as: PACERONE Take 0.5 tablets (100 mg total) by mouth daily.   ammonium lactate 12 % cream Commonly known as: AMLACTIN Apply topically as needed for dry skin.   atorvastatin 80 MG tablet Commonly known as: LIPITOR Take 1 tablet (80 mg total) by mouth daily.   Eliquis 5 MG Tabs tablet Generic drug: apixaban TAKE 1 TABLET BY MOUTH TWICE A DAY   enalapril 10 MG tablet Commonly known as: VASOTEC Take 1 tablet (10 mg total) by mouth daily.   fexofenadine 180 MG tablet Commonly known as: ALLEGRA Take 180 mg by mouth daily as needed for allergies or rhinitis.   fluticasone 50 MCG/ACT nasal spray Commonly known as: FLONASE Place 1 spray into both nostrils daily as needed for allergies or rhinitis.   metoprolol succinate 50 MG 24 hr tablet Commonly known as: TOPROL-XL TAKE 1 TABLET BY MOUTH EVERY DAY   nitroGLYCERIN 0.4 MG SL tablet Commonly known as: NITROSTAT PLACE 1 TABLET UNDER TONGUE EVERY 5 MINUTES FOR 3 DOSES FOR CHEST PAIN   omeprazole 20 MG tablet Commonly known as:  PRILOSEC OTC Take 20 mg by mouth daily before breakfast.   traMADol 50 MG tablet Commonly known as: Ultram Take 1 tablet (50 mg total) by mouth every 6 (six) hours as needed.   Vitamin D3 50 MCG (2000 UT) Tabs Take 2,000 Units by mouth  daily after breakfast.   WHEAT DEXTRIN PO Take by mouth. Take 2 teaspoons full in to 8 oz of water or any drink by mouth once daily        Review of Systems:  Review of Systems  Constitutional:  Negative for activity change, appetite change and fever.  HENT:  Negative for congestion, hearing loss and voice change.   Respiratory:  Negative for cough, chest tightness, shortness of breath and wheezing.   Cardiovascular:  Positive for leg swelling.  Gastrointestinal:  Negative for abdominal pain and constipation.  Genitourinary:  Negative for difficulty urinating, dysuria and urgency.  Musculoskeletal:  Positive for arthralgias and gait problem.       S/p L TKR,  left hip pain  f/u Ortho, injx2. Pain is resolved right knee OA s/p inj,  Skin:        Left fore arm scabbed over area from skin cancer removal.    Neurological:  Negative for dizziness, speech difficulty and headaches.       Memory lapses.   Hematological:  Does not bruise/bleed easily.  Psychiatric/Behavioral:  Negative for agitation, behavioral problems and sleep disturbance.    Health Maintenance  Topic Date Due   TETANUS/TDAP  Never done   HEMOGLOBIN A1C  12/01/2019   INFLUENZA VACCINE  08/12/2020   COVID-19 Vaccine (5 - Booster for Moderna series) 10/11/2020   OPHTHALMOLOGY EXAM  05/30/2021   FOOT EXAM  10/17/2021   DEXA SCAN  Completed   Zoster Vaccines- Shingrix  Completed   HPV VACCINES  Aged Out    Physical Exam: Vitals:   10/17/20 1428  BP: 126/80  Pulse: (!) 56  Temp: (!) 97.3 F (36.3 C)  TempSrc: Temporal  SpO2: 93%  Weight: 185 lb (83.9 kg)  Height: 5' 7"  (1.702 m)   Body mass index is 28.98 kg/m. Physical Exam Constitutional:      Appearance: Normal  appearance.  HENT:     Head: Normocephalic and atraumatic.     Nose: Nose normal.     Mouth/Throat:     Mouth: Mucous membranes are dry.  Eyes:     Extraocular Movements: Extraocular movements intact.     Pupils: Pupils are equal, round, and reactive to light.  Cardiovascular:     Rate and Rhythm: Regular rhythm. Bradycardia present.     Heart sounds: Murmur heard.     Comments: HR 54bpm. Weak DP pulse L, no felt DP pulse R, dependent rubor, cool to touch R+L feet.  Pulmonary:     Effort: Pulmonary effort is normal.     Breath sounds: Normal breath sounds. No wheezing or rales.  Abdominal:     General: Bowel sounds are normal.     Palpations: Abdomen is soft.  Musculoskeletal:        General: Tenderness present.     Cervical back: Normal range of motion and neck supple.     Right lower leg: Edema present.     Left lower leg: Edema present.     Comments: Trace BLE. Left hip/groin pain when getting up from sitting.   Skin:    General: Skin is warm and dry.     Comments: BLE cool to touch, dependent rubor noted. Mild venous insufficiency skin changes above ankles. Left fore arm scabbed over area from skin cancer removal.    Neurological:     General: No focal deficit present.     Mental Status: She is alert and oriented to person, place, and time.  Motor: No weakness.     Coordination: Coordination normal.     Gait: Gait abnormal.  Psychiatric:        Mood and Affect: Mood normal.        Behavior: Behavior normal.        Thought Content: Thought content normal.        Judgment: Judgment normal.    Labs reviewed: Basic Metabolic Panel: Recent Labs    04/05/20 0000  NA 142  K 4.5  CL 101  CO2 24  GLUCOSE 101*  BUN 15  CREATININE 1.06*  CALCIUM 10.0  TSH 1.510   Liver Function Tests: Recent Labs    04/05/20 0000  AST 31  ALT 40*  ALKPHOS 54  BILITOT 1.0  PROT 7.5  ALBUMIN 4.4   No results for input(s): LIPASE, AMYLASE in the last 8760 hours. No  results for input(s): AMMONIA in the last 8760 hours. CBC: Recent Labs    04/05/20 0000  WBC 5.4  HGB 13.3  HCT 39.9  MCV 98*  PLT 215   Lipid Panel: Recent Labs    04/05/20 0000  CHOL 169  HDL 70  LDLCALC 74  TRIG 150*  CHOLHDL 2.4   Lab Results  Component Value Date   HGBA1C 5.9 (H) 05/31/2019    Procedures since last visit: No results found.  Assessment/Plan  Paroxysmal atrial fibrillation/flutter (HCC) Heart rate is in control, takes Metoprolol, Eliquis, Amiodarone, f/u cardiology. TSH 1.51 09/05/20  Essential hypertension Blood pressure is controlled,  takes Enalapril, Metoprolol.   Gastroesophageal reflux disease stable, takes Omeprazole 27m qd.   Coronary artery disease involving coronary bypass graft of native heart with angina pectoris (HCC) stable, Cath 04/25/19, prn NTG, takes Atorvastatin. LDL 74 04/05/20  Osteoarthritis of multiple joints  left knee s/p TKR, now left hip pain, failed inj x2, therapy, Tylenol.   History of SCC (squamous cell carcinoma) of skin A small marble sized skin lesion with center umbilical appearance, slightly redness at the base of the skin lesion, SCC was diagnosed 09/26/20, electrodesiccation and curettage done.   Prediabetes Podiatrist for foot exam, toe nail care, f/u Hgb a1c  Hyperlipidemia, goal<70 Update lipid panel.    Labs/tests ordered:  CBC/diff, CMP/eGFR, Hgb a1c, lipid panel  Next appt:  3 months.

## 2020-10-21 ENCOUNTER — Encounter: Payer: Self-pay | Admitting: Nurse Practitioner

## 2020-10-28 ENCOUNTER — Ambulatory Visit: Payer: Medicare Other | Admitting: Dermatology

## 2020-11-04 DIAGNOSIS — M545 Low back pain, unspecified: Secondary | ICD-10-CM | POA: Diagnosis not present

## 2020-11-05 ENCOUNTER — Other Ambulatory Visit: Payer: Self-pay | Admitting: Orthopaedic Surgery

## 2020-11-05 DIAGNOSIS — M5416 Radiculopathy, lumbar region: Secondary | ICD-10-CM

## 2020-11-05 DIAGNOSIS — M545 Low back pain, unspecified: Secondary | ICD-10-CM

## 2020-11-12 DIAGNOSIS — E1159 Type 2 diabetes mellitus with other circulatory complications: Secondary | ICD-10-CM | POA: Diagnosis not present

## 2020-11-12 DIAGNOSIS — L84 Corns and callosities: Secondary | ICD-10-CM | POA: Diagnosis not present

## 2020-11-12 DIAGNOSIS — B351 Tinea unguium: Secondary | ICD-10-CM | POA: Diagnosis not present

## 2020-11-13 ENCOUNTER — Other Ambulatory Visit: Payer: Self-pay

## 2020-11-13 ENCOUNTER — Ambulatory Visit
Admission: RE | Admit: 2020-11-13 | Discharge: 2020-11-13 | Disposition: A | Discharge status: Home / Self Care | Payer: Medicare Other | Source: Ambulatory Visit | Attending: Orthopaedic Surgery | Admitting: Orthopaedic Surgery

## 2020-11-13 DIAGNOSIS — M48061 Spinal stenosis, lumbar region without neurogenic claudication: Secondary | ICD-10-CM | POA: Diagnosis not present

## 2020-11-13 DIAGNOSIS — M545 Low back pain, unspecified: Secondary | ICD-10-CM | POA: Diagnosis not present

## 2020-11-13 DIAGNOSIS — M5416 Radiculopathy, lumbar region: Secondary | ICD-10-CM

## 2020-11-13 MED ORDER — METOPROLOL SUCCINATE ER 50 MG PO TB24: 50.0000 mg | ORAL_TABLET | Freq: Every day | ORAL | 3 refills | Status: DC

## 2020-11-13 NOTE — Telephone Encounter (Signed)
Patient's daughter calling back she states the prescription was received for 200 mg again. She would like to make sure it is updated at the patient's pharmacy. She would also like a call back advising what she should do with the 200 mg tablets they received.

## 2020-11-13 NOTE — Telephone Encounter (Signed)
Returned call to pt's daughter(EC) she states that the pharmacy "keeps filling the rx for metoprolol 200mg " she is asking if pt should be taking the 200mg  or taking the 50mg . In the pt's chart there is metoprolol 50mg  daily not 200mg . Explained to daughter if she keeps refilling the 200mg  they will keep filling the 200mg . She should call the pharmacy and have them fill the 50mg . Verbalized understanding. Informed daughter I have refilled the 50mg  to her requested pharmacy.

## 2020-11-22 DIAGNOSIS — M25562 Pain in left knee: Secondary | ICD-10-CM | POA: Diagnosis not present

## 2020-11-22 DIAGNOSIS — M545 Low back pain, unspecified: Secondary | ICD-10-CM | POA: Diagnosis not present

## 2020-11-22 DIAGNOSIS — M25561 Pain in right knee: Secondary | ICD-10-CM | POA: Diagnosis not present

## 2020-11-26 ENCOUNTER — Other Ambulatory Visit: Payer: Self-pay | Admitting: *Deleted

## 2020-11-26 ENCOUNTER — Encounter: Payer: Self-pay | Admitting: Nurse Practitioner

## 2020-11-26 ENCOUNTER — Non-Acute Institutional Stay (SKILLED_NURSING_FACILITY): Payer: Medicare Other | Admitting: Nurse Practitioner

## 2020-11-26 DIAGNOSIS — K5900 Constipation, unspecified: Secondary | ICD-10-CM

## 2020-11-26 DIAGNOSIS — I25709 Atherosclerosis of coronary artery bypass graft(s), unspecified, with unspecified angina pectoris: Secondary | ICD-10-CM

## 2020-11-26 DIAGNOSIS — M25561 Pain in right knee: Secondary | ICD-10-CM | POA: Diagnosis not present

## 2020-11-26 DIAGNOSIS — K219 Gastro-esophageal reflux disease without esophagitis: Secondary | ICD-10-CM | POA: Diagnosis not present

## 2020-11-26 DIAGNOSIS — N183 Chronic kidney disease, stage 3 unspecified: Secondary | ICD-10-CM | POA: Diagnosis not present

## 2020-11-26 DIAGNOSIS — I10 Essential (primary) hypertension: Secondary | ICD-10-CM | POA: Diagnosis not present

## 2020-11-26 DIAGNOSIS — M159 Polyosteoarthritis, unspecified: Secondary | ICD-10-CM

## 2020-11-26 DIAGNOSIS — I480 Paroxysmal atrial fibrillation: Secondary | ICD-10-CM | POA: Diagnosis not present

## 2020-11-26 MED ORDER — TRAMADOL HCL 50 MG PO TABS
50.0000 mg | ORAL_TABLET | Freq: Four times a day (QID) | ORAL | 0 refills | Status: DC | PRN
Start: 2020-11-26 — End: 2021-06-30

## 2020-11-26 NOTE — Progress Notes (Signed)
Location:   [facility] Room Number: 37 [id number] Place of Service:  SNF (31) Provider: Terilyn Sano X, NP   Pat Sires X, NP  Patient Care Team: Frady Taddeo X, NP as PCP - General (Internal Medicine) Leonie Mikhia Dusek, MD as PCP - Cardiology (Cardiology) Malasha Kleppe X, NP as Nurse Practitioner (Internal Medicine) Lavonna Monarch, MD as Consulting Physician (Dermatology)  Extended Emergency Contact Information Primary Emergency Contact: [place] Mobile Phone: [phone] Relation: Daughter Secondary Emergency Contact: Weeks,Tiffany Mobile Phone: [phone] Relation: Daughter Preferred language: English Interpreter needed? No  Code Status:  Full Code Goals of care: Advanced Directive information Advanced Directives 11/28/2020  Does Patient Have a Medical Advance Directive? Yes  Type of Paramedic of [place];Out of facility DNR (pink MOST or yellow form)  Does patient want to make changes to medical advance directive? No - Patient declined  Copy of [place] in Chart? Yes - validated most recent copy scanned in chart (See row information)  Pre-existing out of facility DNR order (yellow form or pink MOST form) Yellow form placed in chart (order not valid for inpatient use);Pink MOST form placed in chart (order not valid for inpatient use)     Chief Complaint  Patient presents with   Acute Visit    Acute Visit for leg pain    HPI:  [name] seen today for an acute visit for the right knee pain with weight bearing sustained from a fall on the bus 11/22/20. Saw Ortho Dr. Rhona Raider 11/22/20, negative X-ray. MRI 11/13/20 her back showed severer spinal and foraminal stenosis. Tylenol and Tramadol available for pain.   Admitted to [facility] for therapy and care assistance.   SCC was diagnosed 09/26/20, left forearm, electrodesiccation and curettage done.              Afib, takes Metoprolol, Eliquis, Amiodarone, f/u  cardiology. TSH 1.51 09/05/20             HTN, takes Enalapril, Metoprolol.              GERD, stable, takes Omeprazole             CAD, stable, Hx of CABG Cath 04/25/19, prn NTG, takes Atorvastatin. LDL 74 04/05/20             OA, left knee s/p TKR, left hip pain, failed inj x2, therapy. MR lumbar, severe spinal 11/13/20 spinal and foraminal stenosis.   CKD Bun/creat 15/1.06 eGFR 54 04/05/20 Past Medical History:  Diagnosis Date   Atherosclerosis of native coronary artery 2001   LAD & Cx disease -- BMS PCI in 2001; CABG 2002.  PCI in 2006 (per-op [facility])   Atrial fibrillation and flutter ([place]) 04/2019   New diagnosis-major complaint was chest tightness and dyspnea.   Breast cancer ([place])    Cervix dysplasia    GERD (gastroesophageal reflux disease)    Heart attack ([place]) 2002, 2006   Both events reportedly were perioperatively. She has no recollection. Initial PCI was related to angina symptoms (bilateral arm pain)   High cholesterol    Hypertension    Lumbosacral spinal stenosis    Osteoarthritis    Skin cancer    Sleep apnea    Uses CPAP faithfully   Squamous cell carcinoma of skin 02/12/2020   in situ- left neck-anterior ([id number])   Past Surgical History:  Procedure Laterality Date   ABDOMINAL HYSTERECTOMY     bone density  CARDIAC CATHETERIZATION  2001    Dr. Glade Lloyd [facility]) 2 vessel CAD involving LAD-D1 and distal Cx-OM. Appearance of dilated tapered left main with no significant atherosclerosis on IVUS --  BMS PCI of pLAD ([place] BMS 3.0 mm x 15 mm) & with PTCA of ostial D1, PTCA of dLAD.  BMS PCI pCx [facility] BMS 3.5 mm x 11 mm) - unable to reopen dCx-OM (probable distal Atheroembolic occlusion.  Normal LVEF.   COLONOSCOPY     CORONARY ARTERY BYPASS GRAFT  2002   Unsure of details [facility] in [facility])   [place]  2001   ([place]) [place] BMS PCI pLAD (3.0 mm x 15 mm) -PTCA of jailed D1 as well as distal LAD; pCx ([place] BMS 3.5 mm x 11 mm) -unable to  open distalCx-OM -thromboembolic occlusion   DIAGNOSTIC MAMMOGRAM     LEFT HEART CATH AND CORS/GRAFTS ANGIOGRAPHY N/A 04/25/2019   Procedure: LEFT HEART CATH AND CORS/GRAFTS ANGIOGRAPHY;  Surgeon: Troy Sine, MD;  Location: [facility] INVASIVE CV LAB;;; ost-prox LCx stent @ OM1 ostium =100% CTO, Ost-prox LAD stent ~30% ISR, Ost D1 CTO, prox RCA ~30%. SCG-OM2 patent with ostial stent 30%, LIMA-LAD patent; CTO of SVG-D1.    left knee replacement     MASTECTOMY     NM MYOVIEW LTD  12/2014; 2018   Cardiologist (Dr. Kellie Simmering.  [place], [place] ph# [phone]) -->LEXISCAN: Nonischemic, "normal "   pap smear     tonsilectomy     TRANSTHORACIC ECHOCARDIOGRAM  12/2014   Echocardiogram 12/25/14- normal LVEF and systolic function, EF 07-37%, mild grade 1 diastolic dysfunction   TRANSTHORACIC ECHOCARDIOGRAM  04/24/2019   EF 65 to 70%.  Moderate septal LVH.  GR 1 DD-elevated EDP.  Normal PAP.  Relatively normal valves.  No aortic stenosis.    Allergies  Allergen Reactions   Codeine Nausea Only    Allergies as of 11/26/2020       Reactions   Codeine Nausea Only        Medication List        Accurate as of November 26, 2020 11:59 PM. If you have any questions, ask your nurse or doctor.          acetaminophen 500 MG tablet Commonly known as: TYLENOL Take 500 mg by mouth at bedtime as needed (for mild pain and "sleep").   amiodarone 200 MG tablet Commonly known as: PACERONE Take 0.5 tablets (100 mg total) by mouth daily.   ammonium lactate 12 % cream Commonly known as: AMLACTIN Apply topically as needed for dry skin.   atorvastatin 80 MG tablet Commonly known as: LIPITOR Take 1 tablet (80 mg total) by mouth daily.   Eliquis 5 MG Tabs tablet Generic drug: apixaban TAKE 1 TABLET BY MOUTH TWICE A DAY   enalapril 10 MG tablet Commonly known as: VASOTEC Take 1 tablet (10 mg total) by mouth daily.   fexofenadine 180 MG tablet Commonly known as: ALLEGRA Take 180 mg by mouth daily  as needed for allergies or rhinitis.   fluticasone 50 MCG/ACT nasal spray Commonly known as: FLONASE Place 1 spray into both nostrils daily as needed for allergies or rhinitis.   metoprolol succinate 50 MG 24 hr tablet Commonly known as: TOPROL-XL Take 1 tablet (50 mg total) by mouth daily. Take with or immediately following a meal.   nitroGLYCERIN 0.4 MG SL tablet Commonly known as: NITROSTAT PLACE 1 TABLET UNDER TONGUE EVERY 5 MINUTES FOR 3 DOSES FOR CHEST PAIN  omeprazole 20 MG tablet Commonly known as: PRILOSEC OTC Take 20 mg by mouth daily before breakfast.   traMADol 50 MG tablet Commonly known as: Ultram Take 1 tablet (50 mg total) by mouth every 6 (six) hours as needed.   Vitamin D3 50 MCG (2000 UT) Tabs Take 2,000 Units by mouth daily after breakfast.   WHEAT DEXTRIN PO Take by mouth. Take 2 teaspoons full in to 8 oz of water or any drink by mouth once daily   zinc oxide 20 % ointment Apply 1 application topically 3 (three) times daily as needed for irritation. Apply to buttocks after every incontinent episode and as needed for redness.        Review of Systems  Constitutional:  Negative for activity change, appetite change and fever.  HENT:  Negative for congestion, hearing loss and voice change.   Respiratory:  Negative for cough, chest tightness, shortness of breath and wheezing.   Cardiovascular:  Negative for leg swelling.  Gastrointestinal:  Negative for abdominal pain and constipation.  Genitourinary:  Negative for difficulty urinating, dysuria and urgency.  Musculoskeletal:  Positive for arthralgias and gait problem.       S/p L TKR,  left hip pain  f/u Ortho, injx2. Acute pain in the right knee since fall 11/22/20 medial aspect, with weight bearing.   Skin:        Left fore arm scabbed over area from skin cancer removal.    Neurological:  Negative for dizziness, speech difficulty and headaches.       Memory lapses.   Hematological:  Does not  bruise/bleed easily.  Psychiatric/Behavioral:  Negative for agitation, behavioral problems and sleep disturbance.    Immunization History  Administered Date(s) Administered   Fluad Quad(high Dose 65+) 10/28/2020   Influenza, High Dose Seasonal PF 10/21/2016, 09/24/2017, 10/25/2019   Influenza-Unspecified 10/03/2011, 10/04/2012, 01/13/2016   Moderna SARS-COV2 Booster Vaccination 11/21/2019, 06/11/2020   Moderna Sars-Covid-2 Vaccination 01/16/2019, 02/13/2019, 03/13/2019   Pneumococcal-Unspecified 01/13/2016   Zoster Recombinat (Shingrix) 08/24/2018, 02/22/2020   Pertinent  Health Maintenance Due  Topic Date Due   HEMOGLOBIN A1C  12/01/2019   OPHTHALMOLOGY EXAM  05/30/2021   FOOT EXAM  10/17/2021   INFLUENZA VACCINE  Completed   DEXA SCAN  Completed   Fall Risk 04/24/2019 04/25/2019 05/25/2019 09/12/2020 10/17/2020  Falls in the past year? - - 0 0 0  Was there an injury with Fall? - - - 0 0  Fall Risk Category Calculator - - - 0 0  Fall Risk Category - - - Low Low  Patient Fall Risk Level Moderate fall risk Moderate fall risk - Low fall risk Low fall risk  Patient at Risk for Falls Due to - - - No Fall Risks No Fall Risks  Fall risk Follow up - - - Falls evaluation completed Falls evaluation completed   Functional Status Survey:    Vitals:   11/26/20 1009  BP: (!) 141/64  Pulse: 60  Resp: (!) 22  Temp: 97.8 F (36.6 C)  SpO2: 96%  Weight: 185 lb 12.8 oz (84.3 kg)  Height: 5' 7"  (1.702 m)   Body mass index is 29.1 kg/m. Physical Exam Constitutional:      Appearance: Normal appearance.  HENT:     Head: Normocephalic and atraumatic.     Nose: Nose normal.     Mouth/Throat:     Mouth: Mucous membranes are dry.  Eyes:     Extraocular Movements: Extraocular movements intact.     Pupils:  Pupils are equal, round, and reactive to light.  Cardiovascular:     Rate and Rhythm: Normal rate and regular rhythm.     Heart sounds: Murmur heard.     Comments: HR 54bpm. Weak DP pulse  L, no felt DP pulse R, dependent rubor, cool to touch R+L feet.  Pulmonary:     Effort: Pulmonary effort is normal.     Breath sounds: Normal breath sounds. No wheezing or rales.  Abdominal:     General: Bowel sounds are normal.     Palpations: Abdomen is soft.  Musculoskeletal:        General: Tenderness present.     Cervical back: Normal range of motion and neck supple.     Right lower leg: No edema.     Left lower leg: No edema.     Comments: Left hip/groin pain when getting up from sitting. Medial aspect right knee pain palpated, right knee pain with weight bearing, no swelling, redness, or warmth.   Skin:    General: Skin is warm and dry.     Comments: BLE cool to touch, dependent rubor noted. Mild venous insufficiency skin changes above ankles. Left fore arm scabbed over area from skin cancer removal.    Neurological:     General: No focal deficit present.     Mental Status: She is alert and oriented to person, place, and time.     Motor: No weakness.     Coordination: Coordination normal.     Gait: Gait abnormal.  Psychiatric:        Mood and Affect: Mood normal.        Behavior: Behavior normal.        Thought Content: Thought content normal.        Judgment: Judgment normal.    Labs reviewed: Recent Labs    04/05/20 0000  NA 142  K 4.5  CL 101  CO2 24  GLUCOSE 101*  BUN 15  CREATININE 1.06*  CALCIUM 10.0   Recent Labs    04/05/20 0000  AST 31  ALT 40*  ALKPHOS 54  BILITOT 1.0  PROT 7.5  ALBUMIN 4.4   Recent Labs    04/05/20 0000  WBC 5.4  HGB 13.3  HCT 39.9  MCV 98*  PLT 215   Lab Results  Component Value Date   TSH 1.510 04/05/2020   Lab Results  Component Value Date   HGBA1C 5.9 (H) 05/31/2019   Lab Results  Component Value Date   CHOL 169 04/05/2020   HDL 70 04/05/2020   LDLCALC 74 04/05/2020   TRIG 150 (H) 04/05/2020   CHOLHDL 2.4 04/05/2020    Significant Diagnostic Results in last 30 days:  MR LUMBAR SPINE WO  CONTRAST  Result Date: 11/15/2020 CLINICAL DATA:  Low back and leg pain for 6 months. EXAM: MRI LUMBAR SPINE WITHOUT CONTRAST TECHNIQUE: Multiplanar, multisequence MR imaging of the lumbar spine was performed. No intravenous contrast was administered. COMPARISON:  None. FINDINGS: Segmentation:  Standard. Alignment: Grade 1 anterolisthesis of L4 on L5 and L5 on S1 secondary to facet disease. 2 mm retrolisthesis of L3 on L4. Vertebrae: No acute fracture, evidence of discitis, or aggressive bone lesion. Conus medullaris and cauda equina: Conus extends to the T12-L1 level. Conus and cauda equina appear normal. Paraspinal and other soft tissues: No acute paraspinal abnormality. Large left renal cyst. Disc levels: Disc spaces: Degenerative disease with disc height loss at L3-4. Disc desiccation at L4-5 and L5-S1. T12-L1: Minimal  broad-based disc bulge. No foraminal or central canal stenosis. L1-L2: Broad-based disc bulge. Mild bilateral facet arthropathy. No foraminal or central canal stenosis. L2-L3: Broad-based disc bulge with a central disc protrusion with caudal migration of disc material impressing upon the thecal sac. Mild bilateral facet arthropathy. Mild spinal stenosis. No foraminal stenosis. L3-L4: Broad-based disc bulge. Severe bilateral facet arthropathy. Severe spinal stenosis. Moderate right and mild left foraminal stenosis. L4-L5: Broad-based disc bulge. Severe bilateral facet arthropathy with ligamentum flavum infolding. Severe spinal stenosis. Severe right foraminal stenosis. Mild left foraminal stenosis. L5-S1: Minimal broad-based disc bulge. Severe bilateral facet arthropathy. No foraminal or central canal stenosis. IMPRESSION: 1. At L4-5 there is a broad-based disc bulge. Severe bilateral facet arthropathy with ligamentum flavum infolding. Severe spinal stenosis. Severe right foraminal stenosis. Mild left foraminal stenosis. 2. L3-4 there is a broad-based disc bulge. Severe bilateral facet  arthropathy. Severe spinal stenosis. Moderate right and mild left foraminal stenosis. 3. Lumbar spine spondylosis as described above. 4.  No acute osseous injury of the lumbar spine. Electronically Signed   By: Kathreen Devoid M.D.   On: 11/15/2020 07:48    Assessment/Plan Right knee pain the right knee pain with weight bearing sustained from a fall on the bus 11/22/20. Saw Ortho Dr. Rhona Raider 11/22/20, negative X-ray. MRI 11/13/20 her back showed severer spinal and foraminal stenosis. Tylenol and Tramadol available for pain.  Mostly medial aspect palpated, otherwise the right knee pain with weight bearing, f/u Ortho. Continue therapy.   Constipation No BM >4 days until this morning. Will schedule Colace and MiraLax daily.   Paroxysmal atrial fibrillation/flutter (HCC) takes Metoprolol, Eliquis, Amiodarone, f/u cardiology. TSH 1.51 09/05/20  Essential hypertension Blood pressure is controlled, takes Enalapril, Metoprolol.   Gastroesophageal reflux disease stable, takes Omeprazole-desires prn.   Coronary artery disease involving coronary bypass graft of native heart with angina pectoris (HCC) stable, Hx of CABG Cath 04/25/19, prn NTG, takes Atorvastatin. LDL 74 04/05/20  Osteoarthritis of multiple joints left knee s/p TKR, left hip pain, failed inj x2, therapy. MR lumbar, severe spinal 11/13/20 spinal and foraminal stenosis.   CKD (chronic kidney disease) stage 3, GFR 30-59 ml/min Bun/creat 15/1.06 eGFR 54 04/05/20    Family/ staff Communication: plan of care reviewed with the patient and charge nurse.   Labs/tests ordered:   none  Time spend 35 minutes.

## 2020-11-26 NOTE — Assessment & Plan Note (Signed)
takes Metoprolol, Eliquis, Amiodarone, f/u cardiology. TSH 1.51 09/05/20

## 2020-11-26 NOTE — Assessment & Plan Note (Signed)
the right knee pain with weight bearing sustained from a fall on the bus 11/22/20. Saw Ortho Dr. Rhona Raider 11/22/20, negative X-ray. MRI 11/13/20 her back showed severer spinal and foraminal stenosis. Tylenol and Tramadol available for pain.  Mostly medial aspect palpated, otherwise the right knee pain with weight bearing, f/u Ortho. Continue therapy.

## 2020-11-26 NOTE — Assessment & Plan Note (Signed)
stable, takes Omeprazole-desires prn.

## 2020-11-26 NOTE — Assessment & Plan Note (Signed)
Blood pressure is controlled, takes Enalapril, Metoprolol.

## 2020-11-26 NOTE — Assessment & Plan Note (Signed)
left knee s/p TKR, left hip pain, failed inj x2, therapy. MR lumbar, severe spinal 11/13/20 spinal and foraminal stenosis.

## 2020-11-26 NOTE — Telephone Encounter (Signed)
Pharmacy requested refill.  Pended Rx and sent to [facility] for approval.  

## 2020-11-26 NOTE — Assessment & Plan Note (Signed)
stable, Hx of CABG Cath 04/25/19, prn NTG, takes Atorvastatin. LDL 74 04/05/20

## 2020-11-26 NOTE — Assessment & Plan Note (Signed)
No BM >4 days until this morning. Will schedule Colace and MiraLax daily.

## 2020-11-28 ENCOUNTER — Non-Acute Institutional Stay (SKILLED_NURSING_FACILITY): Payer: Medicare Other | Admitting: Internal Medicine

## 2020-11-28 ENCOUNTER — Encounter: Payer: Self-pay | Admitting: Internal Medicine

## 2020-11-28 ENCOUNTER — Telehealth: Payer: Self-pay | Admitting: Internal Medicine

## 2020-11-28 ENCOUNTER — Encounter: Payer: Self-pay | Admitting: Nurse Practitioner

## 2020-11-28 DIAGNOSIS — K219 Gastro-esophageal reflux disease without esophagitis: Secondary | ICD-10-CM

## 2020-11-28 DIAGNOSIS — E785 Hyperlipidemia, unspecified: Secondary | ICD-10-CM

## 2020-11-28 DIAGNOSIS — I480 Paroxysmal atrial fibrillation: Secondary | ICD-10-CM

## 2020-11-28 DIAGNOSIS — M25561 Pain in right knee: Secondary | ICD-10-CM | POA: Diagnosis not present

## 2020-11-28 DIAGNOSIS — W19XXXD Unspecified fall, subsequent encounter: Secondary | ICD-10-CM

## 2020-11-28 DIAGNOSIS — I25709 Atherosclerosis of coronary artery bypass graft(s), unspecified, with unspecified angina pectoris: Secondary | ICD-10-CM | POA: Diagnosis not present

## 2020-11-28 DIAGNOSIS — Z85828 Personal history of other malignant neoplasm of skin: Secondary | ICD-10-CM

## 2020-11-28 DIAGNOSIS — Z9989 Dependence on other enabling machines and devices: Secondary | ICD-10-CM | POA: Diagnosis not present

## 2020-11-28 DIAGNOSIS — G4733 Obstructive sleep apnea (adult) (pediatric): Secondary | ICD-10-CM | POA: Diagnosis not present

## 2020-11-28 DIAGNOSIS — I10 Essential (primary) hypertension: Secondary | ICD-10-CM

## 2020-11-28 DIAGNOSIS — R4189 Other symptoms and signs involving cognitive functions and awareness: Secondary | ICD-10-CM | POA: Diagnosis not present

## 2020-11-28 DIAGNOSIS — N183 Chronic kidney disease, stage 3 unspecified: Secondary | ICD-10-CM | POA: Diagnosis not present

## 2020-11-28 NOTE — Progress Notes (Signed)
Provider:  Veleta Miners MD Location:   [place] Room Number: 3 Place of Service:  SNF (31)  PCP: Mast, [name], NP Patient Care Team: Mast, [name], NP as PCP - General (Internal Medicine) Leonie Man, MD as PCP - Cardiology (Cardiology) Mast, [name], NP as Nurse Practitioner (Internal Medicine) Lavonna Monarch, MD as Consulting Physician (Dermatology)  Extended Emergency Contact Information Primary Emergency Contact: Rothbury Mobile Phone: [phone] Relation: Daughter Secondary Emergency Contact: Weeks,Tiffany Mobile Phone: [phone] Relation: Daughter Preferred language: English Interpreter needed? No  Code Status: Full Code Goals of Care: Advanced Directive information Advanced Directives 11/28/2020  Does Patient Have a Medical Advance Directive? Yes  Type of Paramedic of [place];Out of facility DNR (pink MOST or yellow form)  Does patient want to make changes to medical advance directive? No - Patient declined  Copy of [place] in Chart? Yes - validated most recent copy scanned in chart (See row information)  Pre-existing out of facility DNR order (yellow form or pink MOST form) Yellow form placed in chart (order not valid for inpatient use);Pink MOST form placed in chart (order not valid for inpatient use)      Chief Complaint  Patient presents with   New Admit To SNF    Admission to SNF    HPI: Patient is a 87 y.o. female seen today for admission to SNF for Care and therapy  Patient has a history of CAD s/p CABG in 2002 S/p cath in 04/21 which showed occluded SVG to Diag but patent LIMA to LAD and SVG to CX.EF. On Medical Management EF 65% PAF diagnosed in 4/21 on Eliquis Sleep Apnea uses CPAP H/I Skin Cancer H/o Breast Cancer s/p Mastectomy  HTN,HLD and Arthritis  Patient was trying to get in the bus to go for grocery shopping when she fell on her right knee 11/11.  Was seen by  Ortho and had negative x-ray.  But patient was unable to put weight on it and do her normal ADLs and family decided to move her to SNF. Patient states her pain is only when she tries to stand on  that knee.  Denies any pain at rest denies any weakness no pain in her left leg Denies any dizziness chest pain shortness of breath.  Lives in [place] in [place]  Past Medical History:  Diagnosis Date   Atherosclerosis of native coronary artery 2001   LAD & Cx disease -- BMS PCI in 2001; CABG 2002.  PCI in 2006 (per-op TAH)   Atrial fibrillation and flutter ([place]) 04/2019   New diagnosis-major complaint was chest tightness and dyspnea.   Breast cancer ([place])    Cervix dysplasia    GERD (gastroesophageal reflux disease)    Heart attack ([place]) 2002, 2006   Both events reportedly were perioperatively. She has no recollection. Initial PCI was related to angina symptoms (bilateral arm pain)   High cholesterol    Hypertension    Lumbosacral spinal stenosis    Osteoarthritis    Skin cancer    Sleep apnea    Uses CPAP faithfully   Squamous cell carcinoma of skin 02/12/2020   in situ- left neck-anterior (CX35FU)   Past Surgical History:  Procedure Laterality Date   ABDOMINAL HYSTERECTOMY     bone density     CARDIAC CATHETERIZATION  2001    Dr. Glade [facility]) 2 vessel CAD involving LAD-D1 and distal Cx-OM. Appearance of dilated tapered left main  with no significant atherosclerosis on IVUS --  BMS PCI of pLAD ([place] BMS 3.0 mm x 15 mm) & with PTCA of ostial D1, PTCA of dLAD.  BMS PCI pCx [facility] BMS 3.5 mm x 11 mm) - unable to reopen dCx-OM (probable distal Atheroembolic occlusion.  Normal LVEF.   COLONOSCOPY     CORONARY ARTERY BYPASS GRAFT  2002   Unsure of details [facility], in [facility])   [place]  2001   ([place]) [place] BMS PCI pLAD (3.0 mm x 15 mm) -PTCA of jailed D1 as well as distal LAD; pCx ([facility]ale BMS 3.5 mm x 11 mm) -unable to open distalCx-OM -thromboembolic  occlusion   DIAGNOSTIC MAMMOGRAM     LEFT HEART CATH AND CORS/GRAFTS ANGIOGRAPHY N/A 04/25/2019   Procedure: LEFT HEART CATH AND CORS/GRAFTS ANGIOGRAPHY;  Surgeon: Troy Sine, MD;  Location: [facility];;; ost-prox LCx stent @ OM1 ostium =100% CTO, Ost-prox LAD stent ~30% ISR, Ost D1 CTO, prox RCA ~30%. SCG-OM2 patent with ostial stent 30%, LIMA-LAD patent; CTO of SVG-D1.    left knee replacement     MASTECTOMY     NM MYOVIEW LTD  12/2014; 2018   Cardiologist (Dr. Kellie Simmering.  [place], [place] ph# [phone]) -->LEXISCAN: Nonischemic, "normal "   pap smear     tonsilectomy     TRANSTHORACIC ECHOCARDIOGRAM  12/2014   Echocardiogram 12/25/14- normal LVEF and systolic function, EF 97-98%, mild grade 1 diastolic dysfunction   TRANSTHORACIC ECHOCARDIOGRAM  04/24/2019   EF 65 to 70%.  Moderate septal LVH.  GR 1 DD-elevated EDP.  Normal PAP.  Relatively normal valves.  No aortic stenosis.    reports that she has never smoked. She has never used smokeless tobacco. She reports that she does not drink alcohol and does not use drugs. Social History   Socioeconomic History   Marital status: Widowed    Spouse name: Not on file   Number of children: Not on file   Years of education: Not on file   Highest education level: Not on file  Occupational History   Not on file  Tobacco Use   Smoking status: Never   Smokeless tobacco: Never  Vaping Use   Vaping Use: Never used  Substance and Sexual Activity   Alcohol use: No   Drug use: No   Sexual activity: Not Currently  Other Topics Concern   Not on file  Social History Narrative   Recently moved back to [place] (lives alone in a [place] apartment at [facility])   Do you drink/eat things with caffeine? 2 cups of coffee every day   What year were you married? [place] - twice widowed   No pets.      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 Has POA      [place] (Daughter) [phone])      Primary Emergency Contact: Suddreth,JOHN L   Address: [address],  [place] [address]   Home Phone: [phone]   Social Determinants of Health   Financial Resource Strain: Not on file  Food Insecurity: Not on file  Transportation Needs: Not on file  Physical Activity: Not on file  Stress: Not on file  Social Connections: Not on file  Intimate Partner Violence: Not on file    Functional Status Survey:  Family History  Problem Relation Age of Onset   Stroke Mother    Heart disease Father     Health Maintenance  Topic Date Due   Pneumonia Vaccine 46+ Years old (1 - PCV) 10/27/1939   TETANUS/TDAP  Never done   HEMOGLOBIN A1C  12/01/2019   COVID-19 Vaccine (4 - Booster for Moderna series) 08/06/2020   OPHTHALMOLOGY EXAM  05/30/2021   FOOT EXAM  10/17/2021   INFLUENZA VACCINE  Completed   DEXA SCAN  Completed   Zoster Vaccines- Shingrix  Completed   HPV VACCINES  Aged Out    Allergies  Allergen Reactions   Codeine Nausea Only    Allergies as of 11/28/2020       Reactions   Codeine Nausea Only        Medication List        Accurate as of November 28, 2020 11:59 PM. If you have any questions, ask your nurse or doctor.          acetaminophen 500 MG tablet Commonly known as: TYLENOL Take 650 mg by mouth 3 (three) times daily.   amiodarone 200 MG tablet Commonly known as: PACERONE Take 0.5 tablets (100 mg total) by mouth daily.   ammonium lactate 12 % cream Commonly known as: AMLACTIN Apply topically as needed for dry skin.   atorvastatin 80 MG tablet Commonly known as: LIPITOR Take 1 tablet (80 mg total) by mouth daily.   docusate sodium 100 MG capsule Commonly known as: COLACE Take 100 mg by mouth daily.   Eliquis 5 MG Tabs tablet Generic drug: apixaban TAKE 1 TABLET BY MOUTH TWICE A DAY   enalapril 10 MG tablet Commonly known as: VASOTEC Take 1  tablet (10 mg total) by mouth daily.   fexofenadine 180 MG tablet Commonly known as: ALLEGRA Take 180 mg by mouth daily as needed for allergies or rhinitis.   fluticasone 50 MCG/ACT nasal spray Commonly known as: FLONASE Place 1 spray into both nostrils daily as needed for allergies or rhinitis.   metoprolol succinate 50 MG 24 hr tablet Commonly known as: TOPROL-XL Take 1 tablet (50 mg total) by mouth daily. Take with or immediately following a meal.   nitroGLYCERIN 0.4 MG SL tablet Commonly known as: NITROSTAT PLACE 1 TABLET UNDER TONGUE EVERY 5 MINUTES FOR 3 DOSES FOR CHEST PAIN   omeprazole 20 MG tablet Commonly known as: PRILOSEC OTC Take 20 mg by mouth daily before breakfast.   polyethylene glycol 17 g packet Commonly known as: MIRALAX / GLYCOLAX Take 17 g by mouth daily.   traMADol 50 MG tablet Commonly known as: Ultram Take 1 tablet (50 mg total) by mouth every 6 (six) hours as needed.   Vitamin D3 50 MCG (2000 UT) Tabs Take 2,000 Units by mouth daily after breakfast.   WHEAT DEXTRIN PO Take by mouth. Take 2 teaspoons full in to 8 oz of water or any drink by mouth once daily   zinc oxide 20 % ointment Apply 1 application topically 3 (three) times daily as needed for irritation. Apply to buttocks after every incontinent episode and as needed for redness.        Review of Systems  Constitutional:  Positive for activity change.  HENT: Negative.    Respiratory: Negative.    Cardiovascular: Negative.   Gastrointestinal:  Positive for constipation.  Genitourinary: Negative.   Musculoskeletal:  Positive for arthralgias, gait problem and myalgias.  Skin: Negative.   Neurological:  Negative for dizziness.  Psychiatric/Behavioral:  Positive for confusion.    Vitals:   11/28/20 0913  BP: 137/63  Pulse: 60  Resp: 18  Temp: 97.6 F (36.4 C)  SpO2: 94%  Weight: 182 lb 3.2 oz (82.6 kg)  Height: 5\' 7"  (1.702 m)   Body mass index is 28.54 kg/m. Physical  Exam Vitals reviewed.  Constitutional:      Appearance: Normal appearance.  HENT:     Head: Normocephalic.     Nose: Nose normal.     Mouth/Throat:     Mouth: Mucous membranes are moist.     Pharynx: Oropharynx is clear.  Eyes:     Pupils: Pupils are equal, round, and reactive to light.  Cardiovascular:     Rate and Rhythm: Normal rate and regular rhythm.     Pulses: Normal pulses.  Pulmonary:     Effort: Pulmonary effort is normal.     Breath sounds: Normal breath sounds.  Abdominal:     General: Abdomen is flat. Bowel sounds are normal.     Palpations: Abdomen is soft.  Musculoskeletal:        General: No swelling.     Comments: Right Knee exam Not tender no swelling. A small bruise on the front of the knee.  Skin:    General: Skin is warm.  Neurological:     General: No focal deficit present.     Mental Status: She is alert.  Psychiatric:        Mood and Affect: Mood normal.        Thought Content: Thought content normal.    Labs reviewed: Basic Metabolic Panel: Recent Labs    04/05/20 0000  NA 142  K 4.5  CL 101  CO2 24  GLUCOSE 101*  BUN 15  CREATININE 1.06*  CALCIUM 10.0   Liver Function Tests: Recent Labs    04/05/20 0000  AST 31  ALT 40*  ALKPHOS 54  BILITOT 1.0  PROT 7.5  ALBUMIN 4.4   No results for input(s): LIPASE, AMYLASE in the last 8760 hours. No results for input(s): AMMONIA in the last 8760 hours. CBC: Recent Labs    04/05/20 0000  WBC 5.4  HGB 13.3  HCT 39.9  MCV 98*  PLT 215   Cardiac Enzymes: No results for input(s): CKTOTAL, CKMB, CKMBINDEX, TROPONINI in the last 8760 hours. BNP: Invalid input(s): POCBNP Lab Results  Component Value Date   HGBA1C 5.9 (H) 05/31/2019   Lab Results  Component Value Date   TSH 1.510 04/05/2020   No results found for: VITAMINB12 No results found for: FOLATE No results found for: IRON, TIBC, FERRITIN  Imaging and Procedures obtained prior to SNF admission: MR LUMBAR SPINE WO  CONTRAST  Result Date: 11/15/2020 CLINICAL DATA:  Low back and leg pain for 6 months. EXAM: MRI LUMBAR SPINE WITHOUT CONTRAST TECHNIQUE: Multiplanar, multisequence MR imaging of the lumbar spine was performed. No intravenous contrast was administered. COMPARISON:  None. FINDINGS: Segmentation:  Standard. Alignment: Grade 1 anterolisthesis of L4 on L5 and L5 on S1 secondary to facet disease. 2 mm retrolisthesis of L3 on L4. Vertebrae: No acute fracture, evidence of discitis, or aggressive bone lesion. Conus medullaris and cauda equina: Conus extends to the T12-L1 level. Conus and cauda equina appear normal. Paraspinal and other soft tissues: No acute paraspinal abnormality. Large left renal cyst. Disc levels: Disc spaces: Degenerative disease with disc height loss at L3-4. Disc desiccation at L4-5 and L5-S1. T12-L1: Minimal broad-based disc bulge. No foraminal or central canal  stenosis. L1-L2: Broad-based disc bulge. Mild bilateral facet arthropathy. No foraminal or central canal stenosis. L2-L3: Broad-based disc bulge with a central disc protrusion with caudal migration of disc material impressing upon the thecal sac. Mild bilateral facet arthropathy. Mild spinal stenosis. No foraminal stenosis. L3-L4: Broad-based disc bulge. Severe bilateral facet arthropathy. Severe spinal stenosis. Moderate right and mild left foraminal stenosis. L4-L5: Broad-based disc bulge. Severe bilateral facet arthropathy with ligamentum flavum infolding. Severe spinal stenosis. Severe right foraminal stenosis. Mild left foraminal stenosis. L5-S1: Minimal broad-based disc bulge. Severe bilateral facet arthropathy. No foraminal or central canal stenosis. IMPRESSION: 1. At L4-5 there is a broad-based disc bulge. Severe bilateral facet arthropathy with ligamentum flavum infolding. Severe spinal stenosis. Severe right foraminal stenosis. Mild left foraminal stenosis. 2. L3-4 there is a broad-based disc bulge. Severe bilateral facet  arthropathy. Severe spinal stenosis. Moderate right and mild left foraminal stenosis. 3. Lumbar spine spondylosis as described above. 4.  No acute osseous injury of the lumbar spine. Electronically Signed   By: Kathreen Devoid M.D.   On: 11/15/2020 07:48    Assessment/Plan Acute pain of right knee Schedule Tylenol 650 TID Start on Meloxicam 7.5 mg QD with Meals On Prilosec for protection Therapy  Therapy is saying she is doing well with her transfers Follow up with Ortho if pain not better  Fall, subsequent encounter Will check CBC,CMP  Paroxysmal atrial fibrillation/flutter (HCC) On Eliquis and Amiodarone and Toprol  Essential hypertension On Vasotec and Toprol  GERD Continue on Prilosec CAD Follows with Cardiology Medical Management  Stage 3 chronic kidney disease,  Repeat BMP History of SCC (squamous cell carcinoma) of skin Follow with Dermatology Hyperlipidemia, goal<70 Continue Statin OSA on CPAP  Cognitive impairment Noticed by Daughter and Me Speech did MMSE 27/30 Lost in orientation questions Getting confused about timing Will need further work up as Outpatient Spinal Stenosis Per MRI in 11/22 D/W the Daughter to wait for further work up till she is here for Rehab Not having Back pain right now Family/ staff Communication:   Labs/tests ordered:CBC,CMP

## 2020-11-28 NOTE — Assessment & Plan Note (Signed)
Bun/creat 15/1.06 eGFR 54 04/05/20

## 2020-11-28 NOTE — Telephone Encounter (Signed)
The daughter of patient "Stephanie Shea" called and wanted Dr. Lyndel Safe to know that [name] fell on Thursday (11/21/20). Daughter states, [name] is now in a wheelchair and in a lot of pain however, no known broken bones. Daughter requested a call back at: [phone]

## 2020-12-02 DIAGNOSIS — R197 Diarrhea, unspecified: Secondary | ICD-10-CM | POA: Diagnosis not present

## 2020-12-02 DIAGNOSIS — F0390 Unspecified dementia without behavioral disturbance: Secondary | ICD-10-CM | POA: Diagnosis not present

## 2020-12-02 LAB — BASIC METABOLIC PANEL
BUN: 20 (ref 4–21)
CO2: 28 — AB (ref 13–22)
Chloride: 106 (ref 99–108)
Creatinine: 0.9 (ref 0.5–1.1)
Glucose: 92
Potassium: 4.7 (ref 3.4–5.3)
Sodium: 142 (ref 137–147)

## 2020-12-02 LAB — COMPREHENSIVE METABOLIC PANEL
Albumin: 3.6 (ref 3.5–5.0)
Calcium: 9.4 (ref 8.7–10.7)
Globulin: 2.6

## 2020-12-02 LAB — HEPATIC FUNCTION PANEL
ALT: 15 (ref 7–35)
AST: 17 (ref 13–35)
Alkaline Phosphatase: 43 (ref 25–125)
Bilirubin, Total: 0.9

## 2020-12-02 LAB — CBC: RBC: 3.77 — AB (ref 3.87–5.11)

## 2020-12-02 LAB — CBC AND DIFFERENTIAL
HCT: 37 (ref 36–46)
Hemoglobin: 12.4 (ref 12.0–16.0)
Neutrophils Absolute: 10.7
Platelets: 248 (ref 150–399)
WBC: 6

## 2020-12-23 DIAGNOSIS — M48062 Spinal stenosis, lumbar region with neurogenic claudication: Secondary | ICD-10-CM | POA: Diagnosis not present

## 2020-12-24 ENCOUNTER — Non-Acute Institutional Stay (SKILLED_NURSING_FACILITY): Payer: Medicare Other | Admitting: Nurse Practitioner

## 2020-12-24 ENCOUNTER — Encounter: Payer: Self-pay | Admitting: Nurse Practitioner

## 2020-12-24 ENCOUNTER — Telehealth: Payer: Self-pay | Admitting: *Deleted

## 2020-12-24 DIAGNOSIS — L989 Disorder of the skin and subcutaneous tissue, unspecified: Secondary | ICD-10-CM | POA: Diagnosis not present

## 2020-12-24 DIAGNOSIS — M159 Polyosteoarthritis, unspecified: Secondary | ICD-10-CM

## 2020-12-24 DIAGNOSIS — N183 Chronic kidney disease, stage 3 unspecified: Secondary | ICD-10-CM | POA: Diagnosis not present

## 2020-12-24 DIAGNOSIS — G4733 Obstructive sleep apnea (adult) (pediatric): Secondary | ICD-10-CM | POA: Diagnosis not present

## 2020-12-24 DIAGNOSIS — Z9989 Dependence on other enabling machines and devices: Secondary | ICD-10-CM

## 2020-12-24 DIAGNOSIS — R413 Other amnesia: Secondary | ICD-10-CM | POA: Diagnosis not present

## 2020-12-24 DIAGNOSIS — K219 Gastro-esophageal reflux disease without esophagitis: Secondary | ICD-10-CM | POA: Diagnosis not present

## 2020-12-24 DIAGNOSIS — I10 Essential (primary) hypertension: Secondary | ICD-10-CM

## 2020-12-24 DIAGNOSIS — I480 Paroxysmal atrial fibrillation: Secondary | ICD-10-CM

## 2020-12-24 DIAGNOSIS — I25709 Atherosclerosis of coronary artery bypass graft(s), unspecified, with unspecified angina pectoris: Secondary | ICD-10-CM | POA: Diagnosis not present

## 2020-12-24 NOTE — Assessment & Plan Note (Signed)
stable, Hx of CABG Cath 04/25/19, prn NTG, takes Atorvastatin. LDL 74 04/05/20

## 2020-12-24 NOTE — Assessment & Plan Note (Signed)
SCC was diagnosed 09/26/20, left

## 2020-12-24 NOTE — Progress Notes (Signed)
Location:   [facility] Room Number: 37 A Place of Service:  SNF (31) Provider:  Mohammedali Bedoy X, NP  Keldrick Pomplun X, NP  Patient Care Team: Audon Heymann X, NP as PCP - General (Internal Medicine) Leonie Daneka Lantigua, MD as PCP - Cardiology (Cardiology) Seana Underwood X, NP as Nurse Practitioner (Internal Medicine) Lavonna Monarch, MD as Consulting Physician (Dermatology)  Extended Emergency Contact Information Primary Emergency Contact: [place] Mobile Phone: [phone] Relation: Daughter Secondary Emergency Contact: Weeks,Tiffany Mobile Phone: [phone] Relation: Daughter Preferred language: English Interpreter needed? No  Code Status:   Goals of care: Advanced Directive information Advanced Directives 12/24/2020  Does Patient Have a Medical Advance Directive? Yes  Type of Paramedic of [place];Out of facility DNR (pink MOST or yellow form)  Does patient want to make changes to medical advance directive? No - Patient declined  Copy of [place] in Chart? Yes - validated most recent copy scanned in chart (See row information)  Pre-existing out of facility DNR order (yellow form or pink MOST form) Yellow form placed in chart (order not valid for inpatient use);Pink MOST form placed in chart (order not valid for inpatient use)     Chief Complaint  Patient presents with   Medical Management of Chronic Issues    Routine visit   Quality Metric Gaps    Pneumonia vaccine, Tdap, COVID #4    HPI:  [name] seen today for medical management of chronic diseases.     The right knee pain with weight bearing sustained from a fall on the bus 11/22/20. Saw Ortho Dr. Rhona Raider 11/22/20, negative X-ray. On Tylenol tid, Meloxicam 7.5mg  qd. Still c/o pain with walking.  MRI 11/13/20 her back showed severer spinal and foraminal stenosis with left sciatica, sometime at night when lying in bed for a while.              SCC  was diagnosed 09/26/20, left forearm, electrodesiccation and curettage done.              Afib, takes Metoprolol, Eliquis, Amiodarone, f/u cardiology. TSH 1.51 09/05/20             HTN, takes Enalapril, Metoprolol.              GERD, stable, takes Omeprazole, Hgb 12.4 12/02/20             CAD, stable, Hx of CABG Cath 04/25/19, prn NTG, takes Atorvastatin. LDL 74 04/05/20             OA, left knee s/p TKR, left hip pain, failed inj x2, therapy. MR lumbar, severe spinal 11/13/20 spinal and foraminal stenosis.              CKD Bun/creat 20//0.9 12/02/20  OSA, on CPAP  Cognitive impairment, MMSE 27/30 Past Medical History:  Diagnosis Date   Atherosclerosis of native coronary artery 2001   LAD & Cx disease -- BMS PCI in 2001; CABG 2002.  PCI in 2006 (per-op [facility])   Atrial fibrillation and flutter ([place]) 04/2019   New diagnosis-major complaint was chest tightness and dyspnea.   Breast cancer ([place])    Cervix dysplasia    GERD (gastroesophageal reflux disease)    Heart attack ([place]) 2002, 2006   Both events reportedly were perioperatively. She has no recollection. Initial PCI was related to angina symptoms (bilateral arm pain)   High cholesterol    Hypertension    Lumbosacral  spinal stenosis    Osteoarthritis    Skin cancer    Sleep apnea    Uses CPAP faithfully   Squamous cell carcinoma of skin 02/12/2020   in situ- left neck-anterior (CX35FU)   Past Surgical History:  Procedure Laterality Date   ABDOMINAL HYSTERECTOMY     bone density     CARDIAC CATHETERIZATION  2001    Dr. Glade Lloyd [facility]) 2 vessel CAD involving LAD-D1 and distal Cx-OM. Appearance of dilated tapered left main with no significant atherosclerosis on IVUS --  BMS PCI of pLAD ([place] BMS 3.0 mm x 15 mm) & with PTCA of ostial D1, PTCA of dLAD.  BMS PCI pCx [facility] BMS 3.5 mm x 11 mm) - unable to reopen dCx-OM (probable distal Atheroembolic occlusion.  Normal LVEF.   COLONOSCOPY     CORONARY ARTERY BYPASS GRAFT  2002    Unsure of details [facility] in [facility])   [place]  2001   ([place]) [place]ale BMS PCI pLAD (3.0 mm x 15 mm) -PTCA of jailed D1 as well as distal LAD; pCx ([place]ale BMS 3.5 mm x 11 mm) -unable to open distalCx-OM -thromboembolic occlusion   DIAGNOSTIC MAMMOGRAM     LEFT HEART CATH AND CORS/GRAFTS ANGIOGRAPHY N/A 04/25/2019   Procedure: LEFT HEART CATH AND CORS/GRAFTS ANGIOGRAPHY;  Surgeon: Troy Sine, MD;  Location: [facility] INVASIVE CV LAB;;; ost-prox LCx stent @ OM1 ostium =100% CTO, Ost-prox LAD stent ~30% ISR, Ost D1 CTO, prox RCA ~30%. SCG-OM2 patent with ostial stent 30%, LIMA-LAD patent; CTO of SVG-D1.    left knee replacement     MASTECTOMY     NM MYOVIEW LTD  12/2014; 2018   Cardiologist (Dr. Kellie Simmering.  [place], [place] ph# [phone]) -->LEXISCAN: Nonischemic, "normal "   pap smear     tonsilectomy     TRANSTHORACIC ECHOCARDIOGRAM  12/2014   Echocardiogram 12/25/14- normal LVEF and systolic function, EF 84-69%, mild grade 1 diastolic dysfunction   TRANSTHORACIC ECHOCARDIOGRAM  04/24/2019   EF 65 to 70%.  Moderate septal LVH.  GR 1 DD-elevated EDP.  Normal PAP.  Relatively normal valves.  No aortic stenosis.    Allergies  Allergen Reactions   Codeine Nausea Only    Allergies as of 12/24/2020       Reactions   Codeine Nausea Only        Medication List        Accurate as of December 24, 2020 11:59 PM. If you have any questions, ask your nurse or doctor.          STOP taking these medications    WHEAT DEXTRIN PO Stopped by: Iszabella Hebenstreit X Darnel Mchan, NP       TAKE these medications    acetaminophen 500 MG tablet Commonly known as: TYLENOL Take 650 mg by mouth 3 (three) times daily.   acetaminophen 500 MG tablet Commonly known as: TYLENOL Take 500 mg by mouth at bedtime as needed.   amiodarone 200 MG tablet Commonly known as: PACERONE Take 0.5 tablets (100 mg total) by mouth daily.   ammonium lactate 12 % cream Commonly known as: AMLACTIN Apply  topically as needed for dry skin.   atorvastatin 80 MG tablet Commonly known as: LIPITOR Take 1 tablet (80 mg total) by mouth daily.   docusate sodium 100 MG capsule Commonly known as: COLACE Take 100 mg by mouth daily.   Eliquis 5 MG Tabs tablet Generic drug: apixaban TAKE 1 TABLET BY MOUTH TWICE A DAY  enalapril 10 MG tablet Commonly known as: VASOTEC Take 1 tablet (10 mg total) by mouth daily.   fexofenadine 180 MG tablet Commonly known as: ALLEGRA Take 180 mg by mouth daily as needed for allergies or rhinitis.   fluticasone 50 MCG/ACT nasal spray Commonly known as: FLONASE Place 1 spray into both nostrils daily as needed for allergies or rhinitis.   meloxicam 7.5 MG tablet Commonly known as: MOBIC Take 7.5 mg by mouth daily.   metoprolol succinate 50 MG 24 hr tablet Commonly known as: TOPROL-XL Take 1 tablet (50 mg total) by mouth daily. Take with or immediately following a meal.   nitroGLYCERIN 0.4 MG SL tablet Commonly known as: NITROSTAT PLACE 1 TABLET UNDER TONGUE EVERY 5 MINUTES FOR 3 DOSES FOR CHEST PAIN   omeprazole 20 MG tablet Commonly known as: PRILOSEC OTC Take 20 mg by mouth daily before breakfast.   polyethylene glycol 17 g packet Commonly known as: MIRALAX / GLYCOLAX Take 17 g by mouth daily.   traMADol 50 MG tablet Commonly known as: Ultram Take 1 tablet (50 mg total) by mouth every 6 (six) hours as needed.   Vitamin D3 50 MCG (2000 UT) Tabs Take 2,000 Units by mouth daily after breakfast.   zinc oxide 20 % ointment Apply 1 application topically 3 (three) times daily as needed for irritation. Apply to buttocks after every incontinent episode and as needed for redness.        Review of Systems  Constitutional:  Negative for activity change, appetite change and fever.  HENT:  Negative for congestion, hearing loss and voice change.   Respiratory:  Negative for cough, chest tightness, shortness of breath and wheezing.   Cardiovascular:   Negative for leg swelling.  Gastrointestinal:  Negative for abdominal pain and constipation.  Genitourinary:  Negative for difficulty urinating, dysuria and urgency.  Musculoskeletal:  Positive for arthralgias and gait problem.       S/p L TKR,  left hip pain  f/u Ortho, injx2. Improved acute pain in the right knee since fall 11/22/20 medial aspect, with weight bearing.   Skin:        Left fore arm scabbed over area from skin cancer removal.    Neurological:  Negative for dizziness, speech difficulty and headaches.       Memory lapses.   Hematological:  Does not bruise/bleed easily.  Psychiatric/Behavioral:  Negative for agitation, behavioral problems and sleep disturbance.    Immunization History  Administered Date(s) Administered   Fluad Quad(high Dose 65+) 10/28/2020   Influenza, High Dose Seasonal PF 10/21/2016, 09/24/2017, 10/25/2019   Influenza-Unspecified 10/03/2011, 10/04/2012, 01/13/2016   Moderna SARS-COV2 Booster Vaccination 11/21/2019, 06/11/2020   Moderna Sars-Covid-2 Vaccination 01/16/2019, 02/13/2019, 03/13/2019   Pneumococcal-Unspecified 01/13/2016   Zoster Recombinat (Shingrix) 08/24/2018, 02/22/2020   Pertinent  Health Maintenance Due  Topic Date Due   HEMOGLOBIN A1C  12/01/2019   OPHTHALMOLOGY EXAM  05/30/2021   FOOT EXAM  10/17/2021   INFLUENZA VACCINE  Completed   DEXA SCAN  Completed   Fall Risk 04/24/2019 04/25/2019 05/25/2019 09/12/2020 10/17/2020  Falls in the past year? - - 0 0 0  Was there an injury with Fall? - - - 0 0  Fall Risk Category Calculator - - - 0 0  Fall Risk Category - - - Low Low  Patient Fall Risk Level Moderate fall risk Moderate fall risk - Low fall risk Low fall risk  Patient at Risk for Falls Due to - - - No Fall Risks  No Fall Risks  Fall risk Follow up - - - Falls evaluation completed Falls evaluation completed   Functional Status Survey:    Vitals:   12/24/20 1143  BP: (!) 143/77  Pulse: 62  Resp: 20  Temp: 97.9 F (36.6 C)   SpO2: 95%  Weight: 183 lb 4.8 oz (83.1 kg)  Height: 5\' 7"  (1.702 m)   Body mass index is 28.71 kg/m. Physical Exam Constitutional:      Appearance: Normal appearance.  HENT:     Head: Normocephalic and atraumatic.     Nose: Nose normal.     Mouth/Throat:     Mouth: Mucous membranes are dry.  Eyes:     Extraocular Movements: Extraocular movements intact.     Pupils: Pupils are equal, round, and reactive to light.  Cardiovascular:     Rate and Rhythm: Normal rate and regular rhythm.     Heart sounds: Murmur heard.     Comments: Previous examination:  Weak DP pulse L, no felt DP pulse R, dependent rubor, cool to touch R+L feet.  Pulmonary:     Effort: Pulmonary effort is normal.     Breath sounds: Normal breath sounds. No wheezing or rales.  Abdominal:     General: Bowel sounds are normal.     Palpations: Abdomen is soft.  Musculoskeletal:     Cervical back: Normal range of motion and neck supple.     Right lower leg: No edema.     Left lower leg: No edema.     Comments: Left hip/groin pain when getting up from sitting. Healed medial aspect right knee pain palpated, right knee pain with weight bearing  Skin:    General: Skin is warm and dry.     Comments: BLE cool to touch, dependent rubor noted. Mild venous insufficiency skin changes above ankles. Left fore arm scabbed over area from skin cancer removal.    Neurological:     General: No focal deficit present.     Mental Status: She is alert and oriented to person, place, and time.     Motor: No weakness.     Coordination: Coordination normal.     Gait: Gait abnormal.  Psychiatric:        Mood and Affect: Mood normal.        Behavior: Behavior normal.        Thought Content: Thought content normal.        Judgment: Judgment normal.    Labs reviewed: Recent Labs    04/05/20 0000 12/02/20 0000  NA 142 142  K 4.5 4.7  CL 101 106  CO2 24 28*  GLUCOSE 101*  --   BUN 15 20  CREATININE 1.06* 0.9  CALCIUM 10.0 9.4    Recent Labs    04/05/20 0000 12/02/20 0000  AST 31 17  ALT 40* 15  ALKPHOS 54 43  BILITOT 1.0  --   PROT 7.5  --   ALBUMIN 4.4 3.6   Recent Labs    04/05/20 0000 12/02/20 0000  WBC 5.4 6.0  NEUTROABS  --  10.70  HGB 13.3 12.4  HCT 39.9 37  MCV 98*  --   PLT 215 248   Lab Results  Component Value Date   TSH 1.510 04/05/2020   Lab Results  Component Value Date   HGBA1C 5.9 (H) 05/31/2019   Lab Results  Component Value Date   CHOL 169 04/05/2020   HDL 70 04/05/2020   LDLCALC 74 04/05/2020  TRIG 150 (H) 04/05/2020   CHOLHDL 2.4 04/05/2020    Significant Diagnostic Results in last 30 days:  No results found.  Assessment/Plan Paroxysmal atrial fibrillation/flutter (HCC) Heart rate is in control,  takes Metoprolol, Eliquis, Amiodarone, f/u cardiology. TSH 1.51 09/05/20  Essential hypertension Blood pressure is controlled, akes Enalapril, Metoprolol.   Gastroesophageal reflux disease  stable, takes Omeprazole, Hgb 12.4 12/02/20  Coronary artery disease involving coronary bypass graft of native heart with angina pectoris (HCC) stable, Hx of CABG Cath 04/25/19, prn NTG, takes Atorvastatin. LDL 74 04/05/20  Osteoarthritis of multiple joints OA, left knee s/p TKR, left hip pain, failed inj x2, therapy. MR lumbar, severe spinal 11/13/20 spinal and foraminal stenosis with left sciatica, pain worsens at night sometimes when lying in bed for a while, Tramadol prn is not adequate, requested to try a stronger pain med. The patient state Codeine made her felt bad in past/long time ago, could not remember what allergic reactions-not true allergy she had. The patient desires trying Norco 5/325mg  q6hr prn, the patient's son and grand son present during the visit.   CKD (chronic kidney disease) stage 3, GFR 30-59 ml/min (HCC) Bun/creat 20//0.9 12/02/20  OSA on CPAP on CPAP  Mild memory disturbance MMSE 27/30  Skin lesion SCC was diagnosed 09/26/20, left     Family/  staff Communication: plan of care reviewed with the patient and charge nurse.   Labs/tests ordered:  none  Time spend 35 minutes.

## 2020-12-24 NOTE — Assessment & Plan Note (Signed)
Heart rate is in control,  takes Metoprolol, Eliquis, Amiodarone, f/u cardiology. TSH 1.51 09/05/20

## 2020-12-24 NOTE — Assessment & Plan Note (Signed)
stable, takes Omeprazole, Hgb 12.4 12/02/20

## 2020-12-24 NOTE — Telephone Encounter (Signed)
° °  Pre-operative Risk Assessment    Patient Name: Stephanie Shea  DOB: 05/06/1933 MRN: [id number]      Request for Surgical Clearance    Procedure:   Lumbar interlaminar epidural steroid injection  Date of Surgery:  Clearance TBD                                 Surgeon:  Dr Normajean Glasgow Surgeon's Group or Practice Name:  [facility] Phone number:  [phone] Fax number:  [phone] attn Katharine Look    Type of Clearance Requested:   - Pharmacy:  Hold Apixaban (Eliquis)    2 days prior to procedure    Type of Anesthesia:  Local    Additional requests/questions:   n/a  Olin Pia   12/24/2020, 10:24 AM

## 2020-12-24 NOTE — Assessment & Plan Note (Signed)
Bun/creat 20//0.9 12/02/20

## 2020-12-24 NOTE — Assessment & Plan Note (Signed)
MMSE 27/30

## 2020-12-24 NOTE — Telephone Encounter (Signed)
Clinical pharmacist to confirm 3 days hold of Eliquis prior to lumbar ESI

## 2020-12-24 NOTE — Assessment & Plan Note (Addendum)
OA, left knee s/p TKR, left hip pain, failed inj x2, therapy. MR lumbar, severe spinal 11/13/20 spinal and foraminal stenosis with left sciatica, pain worsens at night sometimes when lying in bed for a while, Tramadol prn is not adequate, requested to try a stronger pain med. The patient state Codeine made her felt bad in past/long time ago, could not remember what allergic reactions-not true allergy she had. The patient desires trying Norco 5/325mg  q6hr prn, the patient's son and grand son present during the visit.

## 2020-12-24 NOTE — Assessment & Plan Note (Signed)
on CPAP ?

## 2020-12-24 NOTE — Assessment & Plan Note (Signed)
Blood pressure is controlled, akes Enalapril, Metoprolol.

## 2020-12-25 NOTE — Telephone Encounter (Signed)
Patient with diagnosis of afib on Eliquis for anticoagulation.    Procedure: lumbar ESI Date of procedure: TBD  CHA2DS2-VASc Score = 6  This indicates a 9.7% annual risk of stroke. The patient's score is based upon: CHF History: 1 HTN History: 1 Diabetes History: 0 Stroke History: 0 Vascular Disease History: 1 Age Score: 2 Gender Score: 1   CrCl 30mL/min using adjusted body weight Platelet count 215K  Per office protocol, patient can hold Eliquis for 3 days prior to procedure.

## 2020-12-25 NOTE — Telephone Encounter (Signed)
° °  Name: Stephanie Shea  DOB: 11-01-1933  MRN: [id number]   Primary Cardiologist: Glenetta Hew, MD  Chart reviewed as part of pre-operative protocol coverage. Patient was contacted 12/25/2020 in reference to pre-operative risk assessment for pending surgery as outlined below.  Stephanie Shea was last seen 08/2020 by Dr. Ellyn Hack, chart reviewed I reached out to patient for update on how she is doing. The patient affirms she has not had any recent cardiac symptoms. She is recovering from a fall at a nursing facility. Therefore, based on ACC/AHA guidelines, the patient would be at acceptable risk for the planned procedure without further cardiovascular testing. The patient was advised that if she develops new symptoms prior to surgery to contact our office to arrange for a follow-up visit, and she verbalized understanding.  Regarding anticoagulation, per pharmD recommendation, per office protocol, patient can hold Eliquis for 3 days prior to procedure (3 days recommended instead of 2).  I will route this recommendation to the requesting party via Epic fax function and remove from pre-op pool. Please call with questions.  Charlie Pitter, PA-C 12/25/2020, 5:25 PM

## 2020-12-26 DIAGNOSIS — Z23 Encounter for immunization: Secondary | ICD-10-CM | POA: Diagnosis not present

## 2020-12-27 ENCOUNTER — Encounter: Payer: Self-pay | Admitting: Nurse Practitioner

## 2020-12-30 ENCOUNTER — Non-Acute Institutional Stay (SKILLED_NURSING_FACILITY): Payer: Medicare Other | Admitting: Orthopedic Surgery

## 2020-12-30 ENCOUNTER — Encounter: Payer: Self-pay | Admitting: Orthopedic Surgery

## 2020-12-30 DIAGNOSIS — M159 Polyosteoarthritis, unspecified: Secondary | ICD-10-CM

## 2020-12-30 DIAGNOSIS — M25561 Pain in right knee: Secondary | ICD-10-CM | POA: Diagnosis not present

## 2020-12-30 DIAGNOSIS — N183 Chronic kidney disease, stage 3 unspecified: Secondary | ICD-10-CM | POA: Diagnosis not present

## 2020-12-30 DIAGNOSIS — R413 Other amnesia: Secondary | ICD-10-CM | POA: Diagnosis not present

## 2020-12-30 DIAGNOSIS — I25709 Atherosclerosis of coronary artery bypass graft(s), unspecified, with unspecified angina pectoris: Secondary | ICD-10-CM | POA: Diagnosis not present

## 2020-12-30 DIAGNOSIS — K5901 Slow transit constipation: Secondary | ICD-10-CM

## 2020-12-30 DIAGNOSIS — G4733 Obstructive sleep apnea (adult) (pediatric): Secondary | ICD-10-CM | POA: Diagnosis not present

## 2020-12-30 DIAGNOSIS — K219 Gastro-esophageal reflux disease without esophagitis: Secondary | ICD-10-CM | POA: Diagnosis not present

## 2020-12-30 DIAGNOSIS — I10 Essential (primary) hypertension: Secondary | ICD-10-CM | POA: Diagnosis not present

## 2020-12-30 DIAGNOSIS — Z9989 Dependence on other enabling machines and devices: Secondary | ICD-10-CM | POA: Diagnosis not present

## 2020-12-30 DIAGNOSIS — H6123 Impacted cerumen, bilateral: Secondary | ICD-10-CM | POA: Diagnosis not present

## 2020-12-30 DIAGNOSIS — I480 Paroxysmal atrial fibrillation: Secondary | ICD-10-CM

## 2020-12-30 NOTE — Progress Notes (Signed)
Location:   [place] Room Number: 37-A Place of Service:  SNF (31) Provider:  Windell Moulding, NP    Patient Care Team: Mast, [name], NP as PCP - General (Internal Medicine) Leonie Man, MD as PCP - Cardiology (Cardiology) Mast, [name], NP as Nurse Practitioner (Internal Medicine) Lavonna Monarch, MD as Consulting Physician (Dermatology)  Extended Emergency Contact Information Primary Emergency Contact: Clinton Mobile Phone: [phone] Relation: Daughter Secondary Emergency Contact: Weeks,Tiffany Mobile Phone: [phone] Relation: Daughter Preferred language: English Interpreter needed? No  Code Status:  DNR Goals of care: Advanced Directive information Advanced Directives 12/30/2020  Does Patient Have a Medical Advance Directive? Yes  Type of Paramedic of [place];Out of facility DNR (pink MOST or yellow form)  Does patient want to make changes to medical advance directive? No - Patient declined  Copy of [place] in Chart? Yes - validated most recent copy scanned in chart (See row information)  Pre-existing out of facility DNR order (yellow form or pink MOST form) -     Chief Complaint  Patient presents with   Acute Visit    Ear Fullness.     HPI:  [name] seen today for an acute visit for ear fullness.   She currently resides on the skilled unit at [facility]. PMH: HFpEF, CAD, HTN, atrial fib, OSA on CPAP, GERD, OA multiple joints, osteopenia, CKD III, constipation, hx breast cancer, hx skin cancer, overweight, and gait abnormality.   Yesterday she noticed her ears feeling "full." She is having to turn the volume up on her television due to difficulty hearing. Denies ear pain or drainage. Denies history of wax buildup. Does not use Qtips.   Right knee pain- followed by Dr. Rhona Raider, continues to work with PT, reports moderate pain when ambulating,  pain controlled with  scheduled tylenol and tramadol prn Atrial fibrillation- TSH 1.51 09/05/2020,HR controlled with metoprolol and amiodarone, remains on Eliquis for clot prevention HTN- BUN/creat 20/0.9 12/02/2020, remains on enalapril and metoprolol CAD- s/p CABG 04/25/2019, LDL 74 04/05/2020, remains on nitroglycerin prn and statin OSA- CPAP q hs GERD- hgb 12.4 12/02/2020, remains on Prilosec OA/ back pain- hx LTKR, left knee and hip continue to be painful, reports unsuccessful steroid injections in past, MRI spine 11/15/2020 -disc bulge/spinal stenosis L3-L4 and L4-L5  CKD III- BUN/creat 20/0.9 12/02/2020 Constipation- LBM 12/15, remains on miralax and colace  No recent falls or injuries. Ambulates with wheelchair.       Past Medical History:  Diagnosis Date   Atherosclerosis of native coronary artery 2001   LAD & Cx disease -- BMS PCI in 2001; CABG 2002.  PCI in 2006 (per-op TAH)   Atrial fibrillation and flutter ([place]) 04/2019   New diagnosis-major complaint was chest tightness and dyspnea.   Breast cancer ([place])    Cervix dysplasia    GERD (gastroesophageal reflux disease)    Heart attack ([place]) 2002, 2006   Both events reportedly were perioperatively. She has no recollection. Initial PCI was related to angina symptoms (bilateral arm pain)   High cholesterol    Hypertension    Lumbosacral spinal stenosis    Osteoarthritis    Skin cancer    Sleep apnea    Uses CPAP faithfully   Squamous cell carcinoma of skin 02/12/2020   in situ- left neck-anterior ([id number])   Past Surgical History:  Procedure Laterality Date   ABDOMINAL HYSTERECTOMY     bone  density     CARDIAC CATHETERIZATION  2001    Dr. Glade Lloyd [facility]) 2 vessel CAD involving LAD-D1 and distal Cx-OM. Appearance of dilated tapered left main with no significant atherosclerosis on IVUS --  BMS PCI of pLAD ([place] BMS 3.0 mm x 15 mm) & with PTCA of ostial D1, PTCA of dLAD.  BMS PCI pCx [facility] BMS 3.5 mm x 11 mm) - unable to reopen  dCx-OM (probable distal Atheroembolic occlusion.  Normal LVEF.   COLONOSCOPY     CORONARY ARTERY BYPASS GRAFT  2002   Unsure of details [facility])   [place]  2001   ([place]) Royale BMS PCI pLAD (3.0 mm x 15 mm) -PTCA of jailed D1 as well as distal LAD; pCx (Royale BMS 3.5 mm x 11 mm) -unable to open distalCx-OM -thromboembolic occlusion   DIAGNOSTIC MAMMOGRAM     LEFT HEART CATH AND CORS/GRAFTS ANGIOGRAPHY N/A 04/25/2019   Procedure: LEFT HEART CATH AND CORS/GRAFTS ANGIOGRAPHY;  Surgeon: Troy Sine, MD;  Location: [facility] INVASIVE CV LAB;;; ost-prox LCx stent @ OM1 ostium =100% CTO, Ost-prox LAD stent ~30% ISR, Ost D1 CTO, prox RCA ~30%. SCG-OM2 patent with ostial stent 30%, LIMA-LAD patent; CTO of SVG-D1.    left knee replacement     MASTECTOMY     NM MYOVIEW LTD  12/2014; 2018   Cardiologist (Dr. Kellie Simmering.  [place], [place] ph# [phone]) -->LEXISCAN: Nonischemic, "normal "   pap smear     tonsilectomy     TRANSTHORACIC ECHOCARDIOGRAM  12/2014   Echocardiogram 12/25/14- normal LVEF and systolic function, EF 05-39%, mild grade 1 diastolic dysfunction   TRANSTHORACIC ECHOCARDIOGRAM  04/24/2019   EF 65 to 70%.  Moderate septal LVH.  GR 1 DD-elevated EDP.  Normal PAP.  Relatively normal valves.  No aortic stenosis.    Allergies  Allergen Reactions   Codeine Nausea Only    Allergies as of 12/30/2020       Reactions   Codeine Nausea Only        Medication List        Accurate as of December 30, 2020 10:04 AM. If you have any questions, ask your nurse or doctor.          acetaminophen 500 MG tablet Commonly known as: TYLENOL Take 500 mg by mouth at bedtime as needed. What changed: Another medication with the same name was removed. Continue taking this medication, and follow the directions you see here. Changed by: Yvonna Alanis, NP   acetaminophen 325 MG tablet Commonly known as: TYLENOL Take 650 mg by mouth 3 (three) times daily. What  changed: Another medication with the same name was removed. Continue taking this medication, and follow the directions you see here. Changed by: Yvonna Alanis, NP   amiodarone 200 MG tablet Commonly known as: PACERONE Take 0.5 tablets (100 mg total) by mouth daily.   ammonium lactate 12 % cream Commonly known as: AMLACTIN Apply topically as needed for dry skin.   atorvastatin 80 MG tablet Commonly known as: LIPITOR Take 1 tablet (80 mg total) by mouth daily.   docusate sodium 100 MG capsule Commonly known as: COLACE Take 100 mg by mouth daily.   Eliquis 5 MG Tabs tablet Generic drug: apixaban TAKE 1 TABLET BY MOUTH TWICE A DAY   enalapril 10 MG tablet Commonly known as: VASOTEC Take 1 tablet (10 mg total) by mouth daily.   fexofenadine 180 MG tablet Commonly known as: ALLEGRA  Take 180 mg by mouth daily as needed for allergies or rhinitis.   fluticasone 50 MCG/ACT nasal spray Commonly known as: FLONASE Place 1 spray into both nostrils daily as needed for allergies or rhinitis.   metoprolol succinate 50 MG 24 hr tablet Commonly known as: TOPROL-XL Take 1 tablet (50 mg total) by mouth daily. Take with or immediately following a meal.   nitroGLYCERIN 0.4 MG SL tablet Commonly known as: NITROSTAT PLACE 1 TABLET UNDER TONGUE EVERY 5 MINUTES FOR 3 DOSES FOR CHEST PAIN   omeprazole 20 MG tablet Commonly known as: PRILOSEC OTC Take 20 mg by mouth daily as needed.   polyethylene glycol 17 g packet Commonly known as: MIRALAX / GLYCOLAX Take 17 g by mouth daily.   traMADol 50 MG tablet Commonly known as: Ultram Take 1 tablet (50 mg total) by mouth every 6 (six) hours as needed.   Vitamin D3 50 MCG (2000 UT) Tabs Take 2,000 Units by mouth daily after breakfast.   zinc oxide 20 % ointment Apply 1 application topically 3 (three) times daily as needed for irritation. Apply to buttocks after every incontinent episode and as needed for redness.        Review of Systems   Constitutional:  Negative for activity change, appetite change, fatigue and fever.  HENT:  Positive for hearing loss. Negative for congestion, ear discharge, ear pain and trouble swallowing.   Eyes:  Negative for visual disturbance.  Respiratory:  Negative for cough, shortness of breath and wheezing.   Cardiovascular:  Negative for chest pain and leg swelling.  Gastrointestinal:  Positive for constipation. Negative for abdominal distention, abdominal pain, diarrhea and nausea.  Genitourinary:  Negative for dysuria, frequency and hematuria.  Musculoskeletal:  Positive for arthralgias, back pain and gait problem.  Skin:  Negative for rash and wound.  Neurological:  Positive for weakness. Negative for dizziness and headaches.  Psychiatric/Behavioral:  Positive for confusion. Negative for dysphoric mood. The patient is not nervous/anxious.    Immunization History  Administered Date(s) Administered   Fluad Quad(high Dose 65+) 10/28/2020   Influenza, High Dose Seasonal PF 10/21/2016, 09/24/2017, 10/25/2019   Influenza-Unspecified 10/03/2011, 10/04/2012, 01/13/2016   Moderna SARS-COV2 Booster Vaccination 11/21/2019, 06/11/2020   Moderna Sars-Covid-2 Vaccination 01/16/2019, 02/13/2019, 03/13/2019   Pneumococcal-Unspecified 01/13/2016   Zoster Recombinat (Shingrix) 08/24/2018, 02/22/2020   Pertinent  Health Maintenance Due  Topic Date Due   HEMOGLOBIN A1C  12/01/2019   OPHTHALMOLOGY EXAM  05/30/2021   FOOT EXAM  10/17/2021   INFLUENZA VACCINE  Completed   DEXA SCAN  Completed   Fall Risk 04/24/2019 04/25/2019 05/25/2019 09/12/2020 10/17/2020  Falls in the past year? - - 0 0 0  Was there an injury with Fall? - - - 0 0  Fall Risk Category Calculator - - - 0 0  Fall Risk Category - - - Low Low  Patient Fall Risk Level Moderate fall risk Moderate fall risk - Low fall risk Low fall risk  Patient at Risk for Falls Due to - - - No Fall Risks No Fall Risks  Fall risk Follow up - - - Falls evaluation  completed Falls evaluation completed   Functional Status Survey:    Vitals:   12/30/20 0959  BP: 114/70  Pulse: (!) 56  Resp: 18  Temp: (!) 97.5 F (36.4 C)  SpO2: 97%  Weight: 183 lb 4.8 oz (83.1 kg)  Height: 5\' 7"  (1.702 m)   Body mass index is 28.71 kg/m. Physical Exam Vitals reviewed.  Constitutional:      General: She is not in acute distress. HENT:     Head: Normocephalic.     Right Ear: There is impacted cerumen.     Left Ear: There is impacted cerumen.     Nose: Nose normal.     Mouth/Throat:     Mouth: Mucous membranes are moist.  Eyes:     General:        Right eye: No discharge.        Left eye: No discharge.  Neck:     Vascular: No carotid bruit.  Cardiovascular:     Rate and Rhythm: Normal rate. Rhythm irregular.     Pulses: Normal pulses.     Heart sounds: Normal heart sounds. No murmur heard. Pulmonary:     Effort: Pulmonary effort is normal. No respiratory distress.     Breath sounds: Normal breath sounds. No wheezing.  Abdominal:     General: Bowel sounds are normal. There is no distension.     Palpations: Abdomen is soft.     Tenderness: There is no abdominal tenderness.  Musculoskeletal:     Cervical back: Normal range of motion.     Right lower leg: No edema.     Left lower leg: No edema.  Lymphadenopathy:     Cervical: No cervical adenopathy.  Skin:    General: Skin is warm and dry.     Capillary Refill: Capillary refill takes less than 2 seconds.  Neurological:     General: No focal deficit present.     Mental Status: She is alert and oriented to person, place, and time.     Motor: Weakness present.     Gait: Gait abnormal.     Comments: Wheelchair/walker  Psychiatric:        Mood and Affect: Mood normal.        Behavior: Behavior normal.        Cognition and Memory: Memory is impaired.     Comments: forgetful    Labs reviewed: Recent Labs    04/05/20 0000 12/02/20 0000  NA 142 142  K 4.5 4.7  CL 101 106  CO2 24 28*   GLUCOSE 101*  --   BUN 15 20  CREATININE 1.06* 0.9  CALCIUM 10.0 9.4   Recent Labs    04/05/20 0000 12/02/20 0000  AST 31 17  ALT 40* 15  ALKPHOS 54 43  BILITOT 1.0  --   PROT 7.5  --   ALBUMIN 4.4 3.6   Recent Labs    04/05/20 0000 12/02/20 0000  WBC 5.4 6.0  NEUTROABS  --  10.70  HGB 13.3 12.4  HCT 39.9 37  MCV 98*  --   PLT 215 248   Lab Results  Component Value Date   TSH 1.510 04/05/2020   Lab Results  Component Value Date   HGBA1C 5.9 (H) 05/31/2019   Lab Results  Component Value Date   CHOL 169 04/05/2020   HDL 70 04/05/2020   LDLCALC 74 04/05/2020   TRIG 150 (H) 04/05/2020   CHOLHDL 2.4 04/05/2020    Significant Diagnostic Results in last 30 days:  No results found.  Assessment/Plan 1. Bilateral impacted cerumen - cannot visualize TM due to wax - start debrox- apply 5 gtts to both ears bid x 5 days - flush ears with warm water on day 6  2. Acute pain of right knee - ongoing, continues to work with PT - follow by Dr. Rhona Raider -  cont tylenol and tramadol - requesting bed rails to get in/out bed better  3. Paroxysmal atrial fibrillation/flutter ([place]) - followed by cardiology - HR controlled with amiodarone and metoprolol - cont Eliquis for clot prevention  4. Essential hypertension - controlled - cont metoprolol and Enalapril  5. Coronary artery disease involving coronary bypass graft of native heart with angina pectoris ([place]) - s/p CABG 2021 - cont statin  6. OSA on CPAP - cont CPAP qhs  7. Gastroesophageal reflux disease, unspecified whether esophagitis present - hgb stable - cont Prilosec  8. Osteoarthritis of multiple joints, unspecified osteoarthritis type - ongoing - MRI spine 11/15/2020- revealed disc bulge/spinal stenosis L3-L4 and L4-L5   9. Stage 3 chronic kidney disease, unspecified whether stage 3a or 3b CKD ([place]) - cont to avoid nephrotoxic drugs like NSAIDS and dose adjust medications to be renally  excreted  10. Slow transit constipation - abdomen soft - cont colace and miralax  11. Mild memory disturbance - MMSE 27/30, appropriate today, forgetful at times    Family/ staff Communication: plan discussed with patient and nurse  Labs/tests ordered:  none

## 2021-01-08 ENCOUNTER — Telehealth: Payer: Self-pay | Admitting: Cardiology

## 2021-01-08 NOTE — Telephone Encounter (Signed)
Pt informed of providers result & recommendations. Pt verbalized understanding. No further questions . She will notify daughter.

## 2021-01-08 NOTE — Telephone Encounter (Signed)
There is always a risk of being off Eliquis however patient was cleared to hold for 3 days on 12/12. The risk of a dangerous bleeding complication from a spinal injection is higher than the risk of being off of anticoagulation for 3 days. Patient should restart Eliquis as soon as surgeon deems it safe after procedure.

## 2021-01-08 NOTE — Telephone Encounter (Signed)
Stephanie Shea, patient's daughter called wondering if someone from the office can give her a call, letting her know that it's okay for her mother to be off of her blood thinner for 3 days prior to her procedure.  He mother wants to make sure Dr. Ellyn Hack gave the okay for her to be off of her blood thinners.

## 2021-01-09 ENCOUNTER — Telehealth: Payer: Self-pay | Admitting: Cardiology

## 2021-01-09 NOTE — Telephone Encounter (Signed)
Follow Up:      Daughter is calling back to see what what was decided about patient's Eliquis. Patient is scheduled for surgery on 01-14-21. Please fax the clearance to Dr Lyndel Safe at [facility]-

## 2021-01-09 NOTE — Telephone Encounter (Signed)
Daughter was calling back to give fax number [phone]. Got disconnected

## 2021-01-10 ENCOUNTER — Non-Acute Institutional Stay (SKILLED_NURSING_FACILITY): Payer: Medicare Other | Admitting: Orthopedic Surgery

## 2021-01-10 ENCOUNTER — Encounter: Payer: Self-pay | Admitting: Orthopedic Surgery

## 2021-01-10 DIAGNOSIS — K219 Gastro-esophageal reflux disease without esophagitis: Secondary | ICD-10-CM

## 2021-01-10 DIAGNOSIS — M5442 Lumbago with sciatica, left side: Secondary | ICD-10-CM | POA: Diagnosis not present

## 2021-01-10 DIAGNOSIS — I25709 Atherosclerosis of coronary artery bypass graft(s), unspecified, with unspecified angina pectoris: Secondary | ICD-10-CM

## 2021-01-10 DIAGNOSIS — I480 Paroxysmal atrial fibrillation: Secondary | ICD-10-CM

## 2021-01-10 DIAGNOSIS — G8929 Other chronic pain: Secondary | ICD-10-CM | POA: Diagnosis not present

## 2021-01-10 DIAGNOSIS — Z9989 Dependence on other enabling machines and devices: Secondary | ICD-10-CM

## 2021-01-10 DIAGNOSIS — M159 Polyosteoarthritis, unspecified: Secondary | ICD-10-CM | POA: Diagnosis not present

## 2021-01-10 DIAGNOSIS — N183 Chronic kidney disease, stage 3 unspecified: Secondary | ICD-10-CM | POA: Diagnosis not present

## 2021-01-10 DIAGNOSIS — I10 Essential (primary) hypertension: Secondary | ICD-10-CM | POA: Diagnosis not present

## 2021-01-10 DIAGNOSIS — K5901 Slow transit constipation: Secondary | ICD-10-CM

## 2021-01-10 DIAGNOSIS — M25552 Pain in left hip: Secondary | ICD-10-CM | POA: Diagnosis not present

## 2021-01-10 DIAGNOSIS — G4733 Obstructive sleep apnea (adult) (pediatric): Secondary | ICD-10-CM | POA: Diagnosis not present

## 2021-01-10 DIAGNOSIS — M25561 Pain in right knee: Secondary | ICD-10-CM

## 2021-01-10 MED ORDER — APIXABAN 5 MG PO TABS: 5.0000 mg | ORAL_TABLET | Freq: Two times a day (BID) | ORAL | 1 refills | Status: DC

## 2021-01-10 NOTE — Progress Notes (Signed)
Location:  [facility] Room Number: 37 Place of Service:  SNF (31) Provider:  Windell Moulding, AGNP-C  Mast, [name], NP  Patient Care Team: Mast, [name], NP as PCP - General (Internal Medicine) Leonie Man, MD as PCP - Cardiology (Cardiology) Mast, [name], NP as Nurse Practitioner (Internal Medicine) Lavonna Monarch, MD as Consulting Physician (Dermatology)  Extended Emergency Contact Information Primary Emergency Contact: [place] Mobile Phone: [phone] Relation: Daughter Secondary Emergency Contact: Weeks,Tiffany Mobile Phone: [phone] Relation: Daughter Preferred language: English Interpreter needed? No  Code Status:  Full code Goals of care: Advanced Directive information Advanced Directives 12/30/2020  Does Patient Have a Medical Advance Directive? Yes  Type of Paramedic of [place];Out of facility DNR (pink MOST or yellow form)  Does patient want to make changes to medical advance directive? No - Patient declined  Copy of [place] in Chart? Yes - validated most recent copy scanned in chart (See row information)  Pre-existing out of facility DNR order (yellow form or pink MOST form) -     Chief Complaint  Patient presents with   Acute Visit    Left leg pain    HPI:  [name] seen today for acute visit due to left leg pain.   She currently resides on the skilled unit at [facility]. PMH: HFpEF, CAD, HTN, atrial fib, OSA on CPAP, GERD, OA multiple joints, osteopenia, CKD III, constipation, hx breast cancer, hx skin cancer, overweight, and gait abnormality.   She reports ongoing left leg pain at night. Pain intermittent, at times it wakes her up. She reports decreased sleep within the past week. Pain described as sharp and aching. Pain radiated from left hip to left knee. She has a long history of orthopedic issues including back and knee pain. She is given scheduled tylenol  three times daily and additional dose at night.  Scheduled to have spinal injection 01/14/2021, Eliquis on hold for next 3 days.   Right knee pain- followed by Dr. Rhona Raider, continues to work with PT, reports moderate pain when ambulating, pain controlled with scheduled tylenol and tramadol prn Atrial fibrillation- TSH 1.51 09/05/2020, HR controlled with metoprolol and amiodarone, remains on Eliquis for clot prevention- on hold due to upcoming procedure x 3 days HTN- BUN/creat 20/0.9 12/02/2020, remains on enalapril and metoprolol CAD- s/p CABG 04/25/2019, LDL 74 04/05/2020, remains on nitroglycerin prn and statin OSA- CPAP q hs GERD- hgb 12.4 12/02/2020, remains on Prilosec OA/ back pain- hx LTKR, left knee and hip continue to be painful, reports unsuccessful steroid injections in past, MRI spine 11/15/2020 -disc bulge/spinal stenosis L3-L4 and L4-L5  CKD III- BUN/creat 20/0.9 12/02/2020 Constipation- LBM 12/29- per patient, remains on miralax and colace    Past Medical History:  Diagnosis Date   Atherosclerosis of native coronary artery 2001   LAD & Cx disease -- BMS PCI in 2001; CABG 2002.  PCI in 2006 (per-op TAH)   Atrial fibrillation and flutter ([place]) 04/2019   New diagnosis-major complaint was chest tightness and dyspnea.   Breast cancer ([place])    Cervix dysplasia    GERD (gastroesophageal reflux disease)    Heart attack ([place]) 2002, 2006   Both events reportedly were perioperatively. She has no recollection. Initial PCI was related to angina symptoms (bilateral arm pain)   High cholesterol    Hypertension    Lumbosacral spinal stenosis    Osteoarthritis    Skin cancer  Sleep apnea    Uses CPAP faithfully   Squamous cell carcinoma of skin 02/12/2020   in situ- left neck-anterior ([id number])   Past Surgical History:  Procedure Laterality Date   ABDOMINAL HYSTERECTOMY     bone density     CARDIAC CATHETERIZATION  2001    Dr. Glade [facility]) 2 vessel CAD involving LAD-D1  and distal Cx-OM. Appearance of dilated tapered left main with no significant atherosclerosis on IVUS --  BMS PCI of pLAD ([facility] BMS 3.0 mm x 15 mm) & with PTCA of ostial D1, PTCA of dLAD.  BMS PCI pCx [facility] BMS 3.5 mm x 11 mm) - unable to reopen dCx-OM (probable distal Atheroembolic occlusion.  Normal LVEF.   COLONOSCOPY     CORONARY ARTERY BYPASS GRAFT  2002   Unsure of details [facility] in [facility])   [place]  2001   ([place]) [facility] BMS PCI pLAD (3.0 mm x 15 mm) -PTCA of jailed D1 as well as distal LAD; pCx ([facility] BMS 3.5 mm x 11 mm) -unable to open distalCx-OM -thromboembolic occlusion   DIAGNOSTIC MAMMOGRAM     LEFT HEART CATH AND CORS/GRAFTS ANGIOGRAPHY N/A 04/25/2019   Procedure: LEFT HEART CATH AND CORS/GRAFTS ANGIOGRAPHY;  Surgeon: Troy Sine, MD;  Location: [facility] INVASIVE CV LAB;;; ost-prox LCx stent @ OM1 ostium =100% CTO, Ost-prox LAD stent ~30% ISR, Ost D1 CTO, prox RCA ~30%. SCG-OM2 patent with ostial stent 30%, LIMA-LAD patent; CTO of SVG-D1.    left knee replacement     MASTECTOMY     NM MYOVIEW LTD  12/2014; 2018   Cardiologist (Dr. Kellie Simmering.  [place], [place] ph# [phone]) -->LEXISCAN: Nonischemic, "normal "   pap smear     tonsilectomy     TRANSTHORACIC ECHOCARDIOGRAM  12/2014   Echocardiogram 12/25/14- normal LVEF and systolic function, EF 47-82%, mild grade 1 diastolic dysfunction   TRANSTHORACIC ECHOCARDIOGRAM  04/24/2019   EF 65 to 70%.  Moderate septal LVH.  GR 1 DD-elevated EDP.  Normal PAP.  Relatively normal valves.  No aortic stenosis.    Allergies  Allergen Reactions   Codeine Nausea Only    Outpatient Encounter Medications as of 01/10/2021  Medication Sig   acetaminophen (TYLENOL) 325 MG tablet Take 650 mg by mouth 3 (three) times daily.   acetaminophen (TYLENOL) 500 MG tablet Take 500 mg by mouth at bedtime as needed.   amiodarone (PACERONE) 200 MG tablet Take 0.5 tablets (100 mg total) by mouth daily.   ammonium lactate  (AMLACTIN) 12 % cream Apply topically as needed for dry skin.   atorvastatin (LIPITOR) 80 MG tablet Take 1 tablet (80 mg total) by mouth daily.   Cholecalciferol (VITAMIN D3) 50 MCG (2000 UT) TABS Take 2,000 Units by mouth daily after breakfast.   docusate sodium (COLACE) 100 MG capsule Take 100 mg by mouth daily.   ELIQUIS 5 MG TABS tablet TAKE 1 TABLET BY MOUTH TWICE A DAY   enalapril (VASOTEC) 10 MG tablet Take 1 tablet (10 mg total) by mouth daily.   fexofenadine (ALLEGRA) 180 MG tablet Take 180 mg by mouth daily as needed for allergies or rhinitis.    fluticasone (FLONASE) 50 MCG/ACT nasal spray Place 1 spray into both nostrils daily as needed for allergies or rhinitis.    metoprolol succinate (TOPROL-XL) 50 MG 24 hr tablet Take 1 tablet (50 mg total) by mouth daily. Take with or immediately following a meal.   nitroGLYCERIN (NITROSTAT) 0.4 MG SL tablet PLACE  1 TABLET UNDER TONGUE EVERY 5 MINUTES FOR 3 DOSES FOR CHEST PAIN   omeprazole (PRILOSEC OTC) 20 MG tablet Take 20 mg by mouth daily as needed.   polyethylene glycol (MIRALAX / GLYCOLAX) 17 g packet Take 17 g by mouth daily.   traMADol (ULTRAM) 50 MG tablet Take 1 tablet (50 mg total) by mouth every 6 (six) hours as needed.   zinc oxide 20 % ointment Apply 1 application topically 3 (three) times daily as needed for irritation. Apply to buttocks after every incontinent episode and as needed for redness.   No facility-administered encounter medications on file as of 01/10/2021.    Review of Systems  Constitutional:  Negative for activity change, appetite change, chills, fatigue and fever.  HENT:  Negative for congestion and trouble swallowing.   Eyes:  Negative for photophobia and visual disturbance.  Respiratory:  Negative for cough, shortness of breath and wheezing.   Cardiovascular:  Negative for chest pain and leg swelling.  Gastrointestinal:  Positive for constipation. Negative for abdominal distention, abdominal pain, diarrhea,  nausea and vomiting.  Genitourinary:  Negative for dysuria, frequency and hematuria.  Musculoskeletal:  Positive for arthralgias, back pain, gait problem and myalgias.  Skin:  Negative for rash and wound.  Neurological:  Positive for weakness. Negative for dizziness, tremors and headaches.  Psychiatric/Behavioral:  Positive for sleep disturbance. Negative for decreased concentration. The patient is not nervous/anxious.    Immunization History  Administered Date(s) Administered   Fluad Quad(high Dose 65+) 10/28/2020   Influenza, High Dose Seasonal PF 10/21/2016, 09/24/2017, 10/25/2019   Influenza-Unspecified 10/03/2011, 10/04/2012, 01/13/2016   Moderna SARS-COV2 Booster Vaccination 11/21/2019, 06/11/2020   Moderna Sars-Covid-2 Vaccination 01/16/2019, 02/13/2019, 03/13/2019   Pneumococcal-Unspecified 01/13/2016   Zoster Recombinat (Shingrix) 08/24/2018, 02/22/2020   Pertinent  Health Maintenance Due  Topic Date Due   HEMOGLOBIN A1C  12/01/2019   OPHTHALMOLOGY EXAM  05/30/2021   FOOT EXAM  10/17/2021   INFLUENZA VACCINE  Completed   DEXA SCAN  Completed   Fall Risk 04/24/2019 04/25/2019 05/25/2019 09/12/2020 10/17/2020  Falls in the past year? - - 0 0 0  Was there an injury with Fall? - - - 0 0  Fall Risk Category Calculator - - - 0 0  Fall Risk Category - - - Low Low  Patient Fall Risk Level Moderate fall risk Moderate fall risk - Low fall risk Low fall risk  Patient at Risk for Falls Due to - - - No Fall Risks No Fall Risks  Fall risk Follow up - - - Falls evaluation completed Falls evaluation completed   Functional Status Survey:    Vitals:   01/10/21 1504  BP: 124/64  Pulse: 64  Resp: 20  Temp: (!) 97.4 F (36.3 C)  SpO2: 93%  Weight: 182 lb 1.6 oz (82.6 kg)  Height: 5\' 7"  (1.702 m)   Body mass index is 28.52 kg/m. Physical Exam Vitals reviewed.  Constitutional:      General: She is not in acute distress. HENT:     Head: Normocephalic.  Eyes:     General:         Right eye: No discharge.        Left eye: No discharge.  Cardiovascular:     Rate and Rhythm: Normal rate. Rhythm irregular.     Pulses: Normal pulses.     Heart sounds: Normal heart sounds. No murmur heard. Pulmonary:     Effort: No respiratory distress.     Breath sounds: Normal breath  sounds. No wheezing.  Abdominal:     General: Bowel sounds are normal. There is no distension.     Palpations: Abdomen is soft.     Tenderness: There is no abdominal tenderness.  Musculoskeletal:     Right lower leg: No edema.     Left lower leg: No edema.  Skin:    General: Skin is warm and dry.     Capillary Refill: Capillary refill takes less than 2 seconds.  Neurological:     General: No focal deficit present.     Mental Status: She is alert and oriented to person, place, and time.     Motor: Weakness present.     Gait: Gait abnormal.     Comments: Wheelchair   Psychiatric:        Mood and Affect: Mood normal.        Behavior: Behavior normal.    Labs reviewed: Recent Labs    04/05/20 0000 12/02/20 0000  NA 142 142  K 4.5 4.7  CL 101 106  CO2 24 28*  GLUCOSE 101*  --   BUN 15 20  CREATININE 1.06* 0.9  CALCIUM 10.0 9.4   Recent Labs    04/05/20 0000 12/02/20 0000  AST 31 17  ALT 40* 15  ALKPHOS 54 43  BILITOT 1.0  --   PROT 7.5  --   ALBUMIN 4.4 3.6   Recent Labs    04/05/20 0000 12/02/20 0000  WBC 5.4 6.0  NEUTROABS  --  10.70  HGB 13.3 12.4  HCT 39.9 37  MCV 98*  --   PLT 215 248   Lab Results  Component Value Date   TSH 1.510 04/05/2020   Lab Results  Component Value Date   HGBA1C 5.9 (H) 05/31/2019   Lab Results  Component Value Date   CHOL 169 04/05/2020   HDL 70 04/05/2020   LDLCALC 74 04/05/2020   TRIG 150 (H) 04/05/2020   CHOLHDL 2.4 04/05/2020    Significant Diagnostic Results in last 30 days:  No results found.  Assessment/Plan 1. Left hip pain - described sharp/aching pain, wakes her up at night - suspect related to ongoing back  issues- scheduled to have spinal injection 01/14/2021 - cont scheduled tylenol  - D/C tylenol 500 mg po qhs - start Tramadol 50 mg po q hs x 7 days- reassess after 1 week  2. Chronic pain of right knee - followed by Dr. Rhona Raider- f/u prn - cont PT  3. Paroxysmal atrial fibrillation/flutter ([facility]) - HR controlled with amiodarone and metoprolol - cont Eliquis for clot prevention- on hold next 3 days   4. Essential hypertension - controlled  - cont metoprolol and Enalapril  5. Coronary artery disease involving coronary bypass graft of native heart with angina pectoris ([place]) - s/p CABG 2021 - cont statin  6. OSA on CPAP - cont CPAP qhs  7. Gastroesophageal reflux disease, unspecified whether esophagitis present - hgb 12.4 12/02/2020 - cont Prilosec  8. Osteoarthritis of multiple joints, unspecified osteoarthritis type - ongoing - - MRI spine 11/15/2020- revealed disc bulge/spinal stenosis L3-L4 and L4-L5  - scheduled to receive spinal injection 01/15/2020 - cont scheduled tylenol and tramadol prn  9. Chronic bilateral low back pain with left-sided sciatica - see above  10. Stage 3 chronic kidney disease, unspecified whether stage 3a or 3b CKD ([place]) - cont to avoid nephrotoxic drugs like NSAIDS and dose adjust medications to be renally excreted  11. Slow transit constipation - LBM  12/29 per patient, abdomen soft - cont colace and miralax    Family/ staff Communication: plan discussed with patient and nurse  Labs/tests ordered:  none

## 2021-01-16 DIAGNOSIS — G4733 Obstructive sleep apnea (adult) (pediatric): Secondary | ICD-10-CM | POA: Diagnosis not present

## 2021-01-17 ENCOUNTER — Non-Acute Institutional Stay (SKILLED_NURSING_FACILITY): Payer: Medicare Other | Admitting: Orthopedic Surgery

## 2021-01-17 ENCOUNTER — Encounter: Payer: Self-pay | Admitting: Orthopedic Surgery

## 2021-01-17 DIAGNOSIS — M159 Polyosteoarthritis, unspecified: Secondary | ICD-10-CM | POA: Diagnosis not present

## 2021-01-17 DIAGNOSIS — Z9989 Dependence on other enabling machines and devices: Secondary | ICD-10-CM | POA: Diagnosis not present

## 2021-01-17 DIAGNOSIS — G4733 Obstructive sleep apnea (adult) (pediatric): Secondary | ICD-10-CM

## 2021-01-17 DIAGNOSIS — R600 Localized edema: Secondary | ICD-10-CM

## 2021-01-17 DIAGNOSIS — I480 Paroxysmal atrial fibrillation: Secondary | ICD-10-CM

## 2021-01-17 DIAGNOSIS — K5901 Slow transit constipation: Secondary | ICD-10-CM | POA: Diagnosis not present

## 2021-01-17 DIAGNOSIS — K219 Gastro-esophageal reflux disease without esophagitis: Secondary | ICD-10-CM | POA: Diagnosis not present

## 2021-01-17 DIAGNOSIS — N183 Chronic kidney disease, stage 3 unspecified: Secondary | ICD-10-CM

## 2021-01-17 DIAGNOSIS — I25709 Atherosclerosis of coronary artery bypass graft(s), unspecified, with unspecified angina pectoris: Secondary | ICD-10-CM | POA: Diagnosis not present

## 2021-01-17 DIAGNOSIS — I10 Essential (primary) hypertension: Secondary | ICD-10-CM | POA: Diagnosis not present

## 2021-01-17 NOTE — Progress Notes (Signed)
Location:   [place] Room Number: 37 Place of Service:  SNF (31) Provider:  Yvonna Alanis, NP   Mast, [name], NP  Patient Care Team: Mast, [name], NP as PCP - General (Internal Medicine) Leonie Man, MD as PCP - Cardiology (Cardiology) Mast, [name], NP as Nurse Practitioner (Internal Medicine) Lavonna Monarch, MD as Consulting Physician (Dermatology)  Extended Emergency Contact Information Primary Emergency Contact: Ballard Mobile Phone: [phone] Relation: Daughter Secondary Emergency Contact: Weeks,Tiffany Mobile Phone: [phone] Relation: Daughter Preferred language: English Interpreter needed? No  Code Status:  full code Goals of care: Advanced Directive information Advanced Directives 01/17/2021  Does Patient Have a Medical Advance Directive? Yes  Type of Paramedic of [place];Out of facility DNR (pink MOST or yellow form)  Does patient want to make changes to medical advance directive? No - Patient declined  Copy of [place] in Chart? Yes - validated most recent copy scanned in chart (See row information)  Pre-existing out of facility DNR order (yellow form or pink MOST form) Yellow form placed in chart (order not valid for inpatient use);Pink MOST form placed in chart (order not valid for inpatient use)     Chief Complaint  Patient presents with   Acute Visit    lower leg swelling     HPI:  Pt is a 88 y.o. female seen today for an acute visit for lower leg swelling.   Nursing reports increased edema to lower legs. She denies calf pain, chest pain or sob. She is often seen sitting in her wheelchair most of the day. Admits to reclining her legs some. Remains on regular diet.  Eliquis was recently on hold from 12/31-01/02 due to upcoming spinal injection 01/14/2021. She was unable to get on the table due to knee pain was did not receive injection. She is in the process of scheduling at  another location to accommodate orthopedic issues.   Atrial fibrillation- TSH 1.51 09/05/2020, HR controlled with metoprolol and amiodarone, remains on Eliquis for clot prevention- on hold due to upcoming procedure x 3 days HTN- BUN/creat 20/0.9 12/02/2020, remains on enalapril and metoprolol CAD- s/p CABG 04/25/2019, LDL 74 04/05/2020, remains on nitroglycerin prn and statin OSA- CPAP q hs GERD- hgb 12.4 12/02/2020, remains on Prilosec OA/ back pain- hx LTKR, bilateral knees and left hip continue to be painful, reports unsuccessful steroid injections in past, MRI spine 11/15/2020 -disc bulge/spinal stenosis L3-L4 and L4-L5, continues to work with PT, remains on Tramadol and tylenol for pain  CKD III- BUN/creat 20/0.9 12/02/2020 Constipation- LBM 01/06- per patient, remains on miralax and colace  12/30 she was found by staff on the floor next to bed. No apparent injury. Ambulates with wheelchair.      Past Medical History:  Diagnosis Date   Atherosclerosis of native coronary artery 2001   LAD & Cx disease -- BMS PCI in 2001; CABG 2002.  PCI in 2006 (per-op TAH)   Atrial fibrillation and flutter ([place]) 04/2019   New diagnosis-major complaint was chest tightness and dyspnea.   Breast cancer ([place])    Cervix dysplasia    GERD (gastroesophageal reflux disease)    Heart attack ([place]) 2002, 2006   Both events reportedly were perioperatively. She has no recollection. Initial PCI was related to angina symptoms (bilateral arm pain)   High cholesterol    Hypertension    Lumbosacral spinal stenosis    Osteoarthritis    Skin cancer  Sleep apnea    Uses CPAP faithfully   Squamous cell carcinoma of skin 02/12/2020   in situ- left neck-anterior ([id number])   Past Surgical History:  Procedure Laterality Date   ABDOMINAL HYSTERECTOMY     bone density     CARDIAC CATHETERIZATION  2001    Dr. Glade Lloyd [facility]) 2 vessel CAD involving LAD-D1 and distal Cx-OM. Appearance of dilated tapered left  main with no significant atherosclerosis on IVUS --  BMS PCI of pLAD ([facility] BMS 3.0 mm x 15 mm) & with PTCA of ostial D1, PTCA of dLAD.  BMS PCI pCx [facility] BMS 3.5 mm x 11 mm) - unable to reopen dCx-OM (probable distal Atheroembolic occlusion.  Normal LVEF.   COLONOSCOPY     CORONARY ARTERY BYPASS GRAFT  2002   Unsure of details [facility] in [facility])   [place]  2001   () [facility] BMS PCI pLAD (3.0 mm x 15 mm) -PTCA of jailed D1 as well as distal LAD; pCx ([facility] BMS 3.5 mm x 11 mm) -unable to open distalCx-OM -thromboembolic occlusion   DIAGNOSTIC MAMMOGRAM     LEFT HEART CATH AND CORS/GRAFTS ANGIOGRAPHY N/A 04/25/2019   Procedure: LEFT HEART CATH AND CORS/GRAFTS ANGIOGRAPHY;  Surgeon: Troy Sine, MD;  Location: [facility] INVASIVE CV LAB;;; ost-prox LCx stent @ OM1 ostium =100% CTO, Ost-prox LAD stent ~30% ISR, Ost D1 CTO, prox RCA ~30%. SCG-OM2 patent with ostial stent 30%, LIMA-LAD patent; CTO of SVG-D1.    left knee replacement     MASTECTOMY     NM MYOVIEW LTD  12/2014; 2018   Cardiologist (Dr. Kellie Simmering.  [place], [place] ph# [phone]) -->LEXISCAN: Nonischemic, "normal "   pap smear     tonsilectomy     TRANSTHORACIC ECHOCARDIOGRAM  12/2014   Echocardiogram 12/25/14- normal LVEF and systolic function, EF 01-09%, mild grade 1 diastolic dysfunction   TRANSTHORACIC ECHOCARDIOGRAM  04/24/2019   EF 65 to 70%.  Moderate septal LVH.  GR 1 DD-elevated EDP.  Normal PAP.  Relatively normal valves.  No aortic stenosis.    Allergies  Allergen Reactions   Codeine Nausea Only    Allergies as of 01/17/2021       Reactions   Codeine Nausea Only        Medication List        Accurate as of January 17, 2021  2:44 PM. If you have any questions, ask your nurse or doctor.          acetaminophen 325 MG tablet Commonly known as: TYLENOL Take 650 mg by mouth 3 (three) times daily. What changed: Another medication with the same name was removed. Continue taking  this medication, and follow the directions you see here. Changed by: Yvonna Alanis, NP   amiodarone 200 MG tablet Commonly known as: PACERONE Take 0.5 tablets (100 mg total) by mouth daily.   ammonium lactate 12 % cream Commonly known as: AMLACTIN Apply topically as needed for dry skin.   apixaban 5 MG Tabs tablet Commonly known as: Eliquis Take 1 tablet (5 mg total) by mouth 2 (two) times daily.   atorvastatin 80 MG tablet Commonly known as: LIPITOR Take 1 tablet (80 mg total) by mouth daily.   docusate sodium 100 MG capsule Commonly known as: COLACE Take 100 mg by mouth daily.   enalapril 10 MG tablet Commonly known as: VASOTEC Take 1 tablet (10 mg total) by mouth daily.   fexofenadine 180 MG tablet Commonly known as:  ALLEGRA Take 180 mg by mouth daily as needed for allergies or rhinitis.   fluticasone 50 MCG/ACT nasal spray Commonly known as: FLONASE Place 1 spray into both nostrils daily as needed for allergies or rhinitis.   metoprolol succinate 50 MG 24 hr tablet Commonly known as: TOPROL-XL Take 1 tablet (50 mg total) by mouth daily. Take with or immediately following a meal.   nitroGLYCERIN 0.4 MG SL tablet Commonly known as: NITROSTAT PLACE 1 TABLET UNDER TONGUE EVERY 5 MINUTES FOR 3 DOSES FOR CHEST PAIN   omeprazole 20 MG tablet Commonly known as: PRILOSEC OTC Take 20 mg by mouth daily as needed.   polyethylene glycol 17 g packet Commonly known as: MIRALAX / GLYCOLAX Take 17 g by mouth daily.   traMADol 50 MG tablet Commonly known as: Ultram Take 1 tablet (50 mg total) by mouth every 6 (six) hours as needed.   Vitamin D3 50 MCG (2000 UT) Tabs Take 2,000 Units by mouth daily after breakfast.   zinc oxide 20 % ointment Apply 1 application topically 3 (three) times daily as needed for irritation. Apply to buttocks after every incontinent episode and as needed for redness.        Review of Systems  Constitutional:  Negative for activity change,  appetite change, fatigue and fever.  HENT:  Negative for congestion and trouble swallowing.   Eyes:  Negative for visual disturbance.  Respiratory:  Negative for cough, shortness of breath and wheezing.   Cardiovascular:  Positive for leg swelling. Negative for chest pain.  Gastrointestinal:  Positive for constipation. Negative for abdominal distention, abdominal pain, diarrhea and nausea.  Genitourinary:  Negative for dysuria, frequency and hematuria.  Musculoskeletal:  Positive for arthralgias, back pain and gait problem.  Skin:  Negative for rash and wound.  Neurological:  Positive for weakness. Negative for dizziness and headaches.  Psychiatric/Behavioral:  Negative for confusion, dysphoric mood and sleep disturbance. The patient is not nervous/anxious.    Immunization History  Administered Date(s) Administered   Fluad Quad(high Dose 65+) 10/28/2020   Influenza, High Dose Seasonal PF 10/21/2016, 09/24/2017, 10/25/2019   Influenza-Unspecified 10/03/2011, 10/04/2012, 01/13/2016   Moderna SARS-COV2 Booster Vaccination 11/21/2019, 06/11/2020   Moderna Sars-Covid-2 Vaccination 01/16/2019, 02/13/2019, 03/13/2019   Pneumococcal-Unspecified 01/13/2016   Zoster Recombinat (Shingrix) 08/24/2018, 02/22/2020   Pertinent  Health Maintenance Due  Topic Date Due   HEMOGLOBIN A1C  12/01/2019   OPHTHALMOLOGY EXAM  05/30/2021   FOOT EXAM  10/17/2021   INFLUENZA VACCINE  Completed   DEXA SCAN  Completed   Fall Risk 04/24/2019 04/25/2019 05/25/2019 09/12/2020 10/17/2020  Falls in the past year? - - 0 0 0  Was there an injury with Fall? - - - 0 0  Fall Risk Category Calculator - - - 0 0  Fall Risk Category - - - Low Low  Patient Fall Risk Level Moderate fall risk Moderate fall risk - Low fall risk Low fall risk  Patient at Risk for Falls Due to - - - No Fall Risks No Fall Risks  Fall risk Follow up - - - Falls evaluation completed Falls evaluation completed   Functional Status Survey:    Vitals:    01/17/21 1354  BP: (!) 142/90  Pulse: 66  Resp: 19  Temp: (!) 97.5 F (36.4 C)  SpO2: 95%  Weight: 180 lb 14.4 oz (82.1 kg)  Height: 5\' 7"  (1.702 m)   Body mass index is 28.33 kg/m. Physical Exam Vitals reviewed.  Constitutional:  General: She is not in acute distress. HENT:     Head: Normocephalic.  Eyes:     General:        Right eye: No discharge.        Left eye: No discharge.  Neck:     Vascular: No carotid bruit.  Cardiovascular:     Rate and Rhythm: Normal rate. Rhythm irregular.     Pulses: Normal pulses.     Heart sounds: Murmur heard.  Pulmonary:     Effort: Pulmonary effort is normal. No respiratory distress.     Breath sounds: Normal breath sounds. No wheezing.  Abdominal:     General: Bowel sounds are normal. There is no distension.     Palpations: Abdomen is soft.     Tenderness: There is no abdominal tenderness.  Musculoskeletal:     Cervical back: Normal range of motion.     Right lower leg: Edema present.     Left lower leg: Edema present.     Comments: 1+ pitting  Lymphadenopathy:     Cervical: No cervical adenopathy.  Skin:    General: Skin is warm and dry.     Capillary Refill: Capillary refill takes less than 2 seconds.  Neurological:     General: No focal deficit present.     Mental Status: She is alert and oriented to person, place, and time.     Motor: Weakness present.     Gait: Gait abnormal.     Comments: Wheelchair  Psychiatric:        Mood and Affect: Mood normal.        Behavior: Behavior normal.    Labs reviewed: Recent Labs    04/05/20 0000 12/02/20 0000  NA 142 142  K 4.5 4.7  CL 101 106  CO2 24 28*  GLUCOSE 101*  --   BUN 15 20  CREATININE 1.06* 0.9  CALCIUM 10.0 9.4   Recent Labs    04/05/20 0000 12/02/20 0000  AST 31 17  ALT 40* 15  ALKPHOS 54 43  BILITOT 1.0  --   PROT 7.5  --   ALBUMIN 4.4 3.6   Recent Labs    04/05/20 0000 12/02/20 0000  WBC 5.4 6.0  NEUTROABS  --  10.70  HGB 13.3 12.4   HCT 39.9 37  MCV 98*  --   PLT 215 248   Lab Results  Component Value Date   TSH 1.510 04/05/2020   Lab Results  Component Value Date   HGBA1C 5.9 (H) 05/31/2019   Lab Results  Component Value Date   CHOL 169 04/05/2020   HDL 70 04/05/2020   LDLCALC 74 04/05/2020   TRIG 150 (H) 04/05/2020   CHOLHDL 2.4 04/05/2020    Significant Diagnostic Results in last 30 days:  No results found.  Assessment/Plan 1. Bilateral leg edema - +1 pitting edema to BLE - suspect from decreased activity/prolonged sitting - furosemide 20 mg po once - KCL 10 meq po once - recommend watching salty foods - recommend elevating legs in recliner 2-4 hours/day  2. Paroxysmal atrial fibrillation/flutter ([facility]) - HR controlled with amiodarone and metoprolol - cont Eliquis for clot prevention  3. Essential hypertension - controlled - cont metoprolol and enalapril  4. Coronary artery disease involving coronary bypass graft of native heart with angina pectoris ([place]) - s/p CABG 2021 - cont statin  5. OSA on CPAP - cont CPAP qhs  6. Gastroesophageal reflux disease, unspecified whether esophagitis present - hgb stable  Cont Prilosec  7. Osteoarthritis of multiple joints, unspecified osteoarthritis type - ongoing - right knee, left hip and lower back most painful - recent spinal injection cancelled due to orthopedic issues - cont Tramadol and tylenol  8. Stage 3 chronic kidney disease, unspecified whether stage 3a or 3b CKD ([place]) - continue to avoid nephrotoxic drugs like NSAIDS and dose adjust medications to be renally excreted  9. Slow transit constipation - abdomen soft - cont colace and miralax    Family/ staff Communication: plan discussed with patient and nurse  Labs/tests ordered:   none

## 2021-01-21 ENCOUNTER — Other Ambulatory Visit: Payer: Medicare Other

## 2021-01-22 ENCOUNTER — Encounter: Payer: Self-pay | Admitting: Orthopedic Surgery

## 2021-01-22 ENCOUNTER — Non-Acute Institutional Stay (SKILLED_NURSING_FACILITY): Payer: Medicare Other | Admitting: Orthopedic Surgery

## 2021-01-22 DIAGNOSIS — R600 Localized edema: Secondary | ICD-10-CM

## 2021-01-22 DIAGNOSIS — N183 Chronic kidney disease, stage 3 unspecified: Secondary | ICD-10-CM

## 2021-01-22 DIAGNOSIS — G4733 Obstructive sleep apnea (adult) (pediatric): Secondary | ICD-10-CM

## 2021-01-22 DIAGNOSIS — K219 Gastro-esophageal reflux disease without esophagitis: Secondary | ICD-10-CM

## 2021-01-22 DIAGNOSIS — I10 Essential (primary) hypertension: Secondary | ICD-10-CM | POA: Diagnosis not present

## 2021-01-22 DIAGNOSIS — I25709 Atherosclerosis of coronary artery bypass graft(s), unspecified, with unspecified angina pectoris: Secondary | ICD-10-CM

## 2021-01-22 DIAGNOSIS — M159 Polyosteoarthritis, unspecified: Secondary | ICD-10-CM

## 2021-01-22 DIAGNOSIS — I480 Paroxysmal atrial fibrillation: Secondary | ICD-10-CM

## 2021-01-22 DIAGNOSIS — K5901 Slow transit constipation: Secondary | ICD-10-CM

## 2021-01-22 DIAGNOSIS — Z9989 Dependence on other enabling machines and devices: Secondary | ICD-10-CM | POA: Diagnosis not present

## 2021-01-22 NOTE — Progress Notes (Signed)
Location:   [place] Room Number: 37 Place of Service:  SNF (31) Provider:  Windell Moulding, NP  Mast, [name], NP  Patient Care Team: Mast, [name], NP as PCP - General (Internal Medicine) Leonie Man, MD as PCP - Cardiology (Cardiology) Mast, [name], NP as Nurse Practitioner (Internal Medicine) Lavonna Monarch, MD as Consulting Physician (Dermatology)  Extended Emergency Contact Information Primary Emergency Contact: [place] Mobile Phone: [phone] Relation: Daughter Secondary Emergency Contact: Weeks,Tiffany Mobile Phone: [phone] Relation: Daughter Preferred language: English Interpreter needed? No  Code Status:   Goals of care: Advanced Directive information Advanced Directives 01/22/2021  Does Patient Have a Medical Advance Directive? Yes  Type of Paramedic of [employer];Out of facility DNR (pink MOST or yellow form);Living will  Does patient want to make changes to medical advance directive? No - Patient declined  Copy of [place] in Chart? Yes - validated most recent copy scanned in chart (See row information)  Pre-existing out of facility DNR order (yellow form or pink MOST form) Yellow form placed in chart (order not valid for inpatient use);Pink MOST form placed in chart (order not valid for inpatient use)     Chief Complaint  Patient presents with   Medical Management of Chronic Issues    Routine follow up visit.   Health Maintenance    Pneumonia vaccine, tetanus/tdap, 2nd COVID booster    HPI:  Pt is a 88 y.o. female seen today for medical management of chronic diseases.    She currently resides on the skilled unit at [facility]. PMH: HFpEF, CAD, HTN, atrial fib, OSA on CPAP, GERD, OA multiple joints, osteopenia, CKD III, constipation, hx breast cancer, hx skin cancer, overweight, and gait abnormality.   Bilateral extremity edema- 1+ pitting edema to BLE 01/06, she was given  lasix 20 mg once 01/07, swelling has reduced, she continues to limit sodium in diet and elevate legs in afternoon Atrial fibrillation- TSH 1.51 09/05/2020, HR controlled with metoprolol and amiodarone, remains on Eliquis for clot prevention- on hold due to upcoming procedure x 3 days HTN- BUN/creat 20/0.9 12/02/2020, remains on enalapril and metoprolol CAD- s/p CABG 04/25/2019, LDL 74 04/05/2020, remains on nitroglycerin prn and statin OSA- CPAP q hs GERD- hgb 12.4 12/02/2020, remains on Prilosec OA/ back pain- hx LTKR, bilateral knees and left hip continue to be painful, recent spinal injection rescheduled due to poor mobility during appointment, rescheduled 02/13/2021- office requesting Eliquis be held 3 days prior to procedure, moving well with PT- ambulated 300 ft this morning, she reports reduced pain with increased ambulation, remains on scheduled tylenol and tramadol prn CKD III- BUN/creat 20/0.9 12/02/2020 Constipation- LBM 01/11- per patient, remains on miralax and colace  Easy to talk with. Attends group activities often. Please with PT progress. No recent falls or injuries. [place] if ambulation continues to improve.   Recent blood pressures:  01/10- 110/62  01/09- 114/64  01/04- 142/90  Recent weights:  01/03- 180.9 lbs  12/07- 183.3 lbs  11/14- 185.8 lbs        Past Medical History:  Diagnosis Date   Atherosclerosis of native coronary artery 2001   LAD & Cx disease -- BMS PCI in 2001; CABG 2002.  PCI in 2006 (per-op TAH)   Atrial fibrillation and flutter ([place]) 04/2019   New diagnosis-major complaint was chest tightness and dyspnea.   Breast cancer ([place])    Cervix  dysplasia    GERD (gastroesophageal reflux disease)    Heart attack ([place]) 2002, 2006   Both events reportedly were perioperatively. She has no recollection. Initial PCI was related to angina symptoms (bilateral arm pain)   High cholesterol    Hypertension    Lumbosacral spinal  stenosis    Osteoarthritis    Skin cancer    Sleep apnea    Uses CPAP faithfully   Squamous cell carcinoma of skin 02/12/2020   in situ- left neck-anterior ([id number])   Past Surgical History:  Procedure Laterality Date   ABDOMINAL HYSTERECTOMY     bone density     CARDIAC CATHETERIZATION  2001    Dr. Glade Lloyd [facility]) 2 vessel CAD involving LAD-D1 and distal Cx-OM. Appearance of dilated tapered left main with no significant atherosclerosis on IVUS --  BMS PCI of pLAD ([facility] BMS 3.0 mm x 15 mm) & with PTCA of ostial D1, PTCA of dLAD.  BMS PCI pCx [facility] BMS 3.5 mm x 11 mm) - unable to reopen dCx-OM (probable distal Atheroembolic occlusion.  Normal LVEF.   COLONOSCOPY     CORONARY ARTERY BYPASS GRAFT  2002   Unsure of details [facility], in [facility])   [place]  2001   ([place]) [facility] BMS PCI pLAD (3.0 mm x 15 mm) -PTCA of jailed D1 as well as distal LAD; pCx ([facility] BMS 3.5 mm x 11 mm) -unable to open distalCx-OM -thromboembolic occlusion   DIAGNOSTIC MAMMOGRAM     LEFT HEART CATH AND CORS/GRAFTS ANGIOGRAPHY N/A 04/25/2019   Procedure: LEFT HEART CATH AND CORS/GRAFTS ANGIOGRAPHY;  Surgeon: Troy Sine, MD;  Location: [facility] INVASIVE CV LAB;;; ost-prox LCx stent @ OM1 ostium =100% CTO, Ost-prox LAD stent ~30% ISR, Ost D1 CTO, prox RCA ~30%. SCG-OM2 patent with ostial stent 30%, LIMA-LAD patent; CTO of SVG-D1.    left knee replacement     MASTECTOMY     NM MYOVIEW LTD  12/2014; 2018   Cardiologist (Dr. Kellie Simmering.  [place], [place] ph# [phone]) -->LEXISCAN: Nonischemic, "normal "   pap smear     tonsilectomy     TRANSTHORACIC ECHOCARDIOGRAM  12/2014   Echocardiogram 12/25/14- normal LVEF and systolic function, EF 96-29%, mild grade 1 diastolic dysfunction   TRANSTHORACIC ECHOCARDIOGRAM  04/24/2019   EF 65 to 70%.  Moderate septal LVH.  GR 1 DD-elevated EDP.  Normal PAP.  Relatively normal valves.  No aortic stenosis.    Allergies  Allergen Reactions   Codeine  Nausea Only    Allergies as of 01/22/2021       Reactions   Codeine Nausea Only        Medication List        Accurate as of January 22, 2021  3:44 PM. If you have any questions, ask your nurse or doctor.          acetaminophen 325 MG tablet Commonly known as: TYLENOL Take 650 mg by mouth 3 (three) times daily.   amiodarone 200 MG tablet Commonly known as: PACERONE Take 0.5 tablets (100 mg total) by mouth daily.   ammonium lactate 12 % cream Commonly known as: AMLACTIN Apply topically as needed for dry skin.   apixaban 5 MG Tabs tablet Commonly known as: Eliquis Take 1 tablet (5 mg total) by mouth 2 (two) times daily.   atorvastatin 80 MG tablet Commonly known as: LIPITOR Take 1 tablet (80 mg total) by mouth daily.   docusate sodium 100 MG capsule Commonly known  as: COLACE Take 100 mg by mouth daily.   enalapril 10 MG tablet Commonly known as: VASOTEC Take 1 tablet (10 mg total) by mouth daily.   fexofenadine 180 MG tablet Commonly known as: ALLEGRA Take 180 mg by mouth daily as needed for allergies or rhinitis.   fluticasone 50 MCG/ACT nasal spray Commonly known as: FLONASE Place 1 spray into both nostrils daily as needed for allergies or rhinitis.   metoprolol succinate 50 MG 24 hr tablet Commonly known as: TOPROL-XL Take 1 tablet (50 mg total) by mouth daily. Take with or immediately following a meal.   nitroGLYCERIN 0.4 MG SL tablet Commonly known as: NITROSTAT PLACE 1 TABLET UNDER TONGUE EVERY 5 MINUTES FOR 3 DOSES FOR CHEST PAIN   omeprazole 20 MG tablet Commonly known as: PRILOSEC OTC Take 20 mg by mouth daily as needed.   polyethylene glycol 17 g packet Commonly known as: MIRALAX / GLYCOLAX Take 17 g by mouth daily.   traMADol 50 MG tablet Commonly known as: Ultram Take 1 tablet (50 mg total) by mouth every 6 (six) hours as needed.   Vitamin D3 50 MCG (2000 UT) Tabs Take 2,000 Units by mouth daily after breakfast.   zinc oxide 20  % ointment Apply 1 application topically 3 (three) times daily as needed for irritation. Apply to buttocks after every incontinent episode and as needed for redness.        Review of Systems  Constitutional:  Negative for activity change, appetite change, chills, fatigue and fever.  HENT:  Negative for congestion and trouble swallowing.   Eyes:  Negative for visual disturbance.  Respiratory:  Negative for cough, shortness of breath and wheezing.   Cardiovascular:  Positive for leg swelling. Negative for chest pain.  Gastrointestinal:  Positive for constipation. Negative for abdominal distention, abdominal pain, diarrhea and nausea.  Genitourinary:  Negative for dysuria, frequency and hematuria.  Musculoskeletal:  Positive for arthralgias, back pain and gait problem.  Skin:  Negative for wound.  Neurological:  Positive for weakness. Negative for dizziness and headaches.  Psychiatric/Behavioral:  Negative for confusion, dysphoric mood and sleep disturbance. The patient is not nervous/anxious.    Immunization History  Administered Date(s) Administered   Fluad Quad(high Dose 65+) 10/28/2020   Influenza, High Dose Seasonal PF 10/21/2016, 09/24/2017, 10/25/2019   Influenza-Unspecified 10/03/2011, 10/04/2012, 01/13/2016   Moderna SARS-COV2 Booster Vaccination 11/21/2019, 06/11/2020   Moderna Sars-Covid-2 Vaccination 01/16/2019, 02/13/2019, 03/13/2019   Pneumococcal-Unspecified 01/13/2016   Zoster Recombinat (Shingrix) 08/24/2018, 02/22/2020   Pertinent  Health Maintenance Due  Topic Date Due   HEMOGLOBIN A1C  12/01/2019   OPHTHALMOLOGY EXAM  05/30/2021   FOOT EXAM  10/17/2021   INFLUENZA VACCINE  Completed   DEXA SCAN  Completed   Fall Risk 04/24/2019 04/25/2019 05/25/2019 09/12/2020 10/17/2020  Falls in the past year? - - 0 0 0  Was there an injury with Fall? - - - 0 0  Fall Risk Category Calculator - - - 0 0  Fall Risk Category - - - Low Low  Patient Fall Risk Level Moderate fall risk  Moderate fall risk - Low fall risk Low fall risk  Patient at Risk for Falls Due to - - - No Fall Risks No Fall Risks  Fall risk Follow up - - - Falls evaluation completed Falls evaluation completed   Functional Status Survey:    Vitals:   01/22/21 1525  BP: 110/62  Pulse: 61  Temp: (!) 97.4 F (36.3 C)  SpO2: 95%  Weight: 180 lb 14.4 oz (82.1 kg)  Height: 5\' 7"  (1.702 m)   Body mass index is 28.33 kg/m. Physical Exam Vitals reviewed.  Constitutional:      General: She is not in acute distress. HENT:     Head: Normocephalic.     Right Ear: There is no impacted cerumen.     Left Ear: There is no impacted cerumen.     Nose: Nose normal.     Mouth/Throat:     Mouth: Mucous membranes are moist.  Eyes:     General:        Right eye: No discharge.        Left eye: No discharge.  Neck:     Vascular: No carotid bruit.  Cardiovascular:     Rate and Rhythm: Normal rate. Rhythm irregular.     Pulses: Normal pulses.     Heart sounds: Normal heart sounds. No murmur heard. Pulmonary:     Effort: Pulmonary effort is normal. No respiratory distress.     Breath sounds: Normal breath sounds. No wheezing.  Abdominal:     General: Bowel sounds are normal. There is no distension.     Palpations: Abdomen is soft.     Tenderness: There is no abdominal tenderness.  Musculoskeletal:     Cervical back: Normal range of motion.     Right lower leg: Edema present.     Left lower leg: Edema present.     Comments: Non pitting  Lymphadenopathy:     Cervical: No cervical adenopathy.  Skin:    General: Skin is warm and dry.     Capillary Refill: Capillary refill takes less than 2 seconds.  Neurological:     General: No focal deficit present.     Mental Status: She is alert and oriented to person, place, and time.     Motor: Weakness present.     Gait: Gait abnormal.     Comments: Wheelchair/walker  Psychiatric:        Mood and Affect: Mood normal.        Behavior: Behavior normal.     Labs reviewed: Recent Labs    04/05/20 0000 12/02/20 0000  NA 142 142  K 4.5 4.7  CL 101 106  CO2 24 28*  GLUCOSE 101*  --   BUN 15 20  CREATININE 1.06* 0.9  CALCIUM 10.0 9.4   Recent Labs    04/05/20 0000 12/02/20 0000  AST 31 17  ALT 40* 15  ALKPHOS 54 43  BILITOT 1.0  --   PROT 7.5  --   ALBUMIN 4.4 3.6   Recent Labs    04/05/20 0000 12/02/20 0000  WBC 5.4 6.0  NEUTROABS  --  10.70  HGB 13.3 12.4  HCT 39.9 37  MCV 98*  --   PLT 215 248   Lab Results  Component Value Date   TSH 1.510 04/05/2020   Lab Results  Component Value Date   HGBA1C 5.9 (H) 05/31/2019   Lab Results  Component Value Date   CHOL 169 04/05/2020   HDL 70 04/05/2020   LDLCALC 74 04/05/2020   TRIG 150 (H) 04/05/2020   CHOLHDL 2.4 04/05/2020    Significant Diagnostic Results in last 30 days:  No results found.  Assessment/Plan 1. Bilateral leg edema - improved, non pitting - suspect from decreased mobility - cont to limit sodium - cont to elevate legs 2-4 hrs daily  2. Paroxysmal atrial fibrillation/flutter ([facility]) - rate controlled with amiodarone  and metoprolol - cont Eliquis for clot prevention- hold 01/30, 01/31, 02/01 for spinal injection  3. Essential hypertension - controlled with enalapril metoprolol  4. Coronary artery disease involving coronary bypass graft of native heart with angina pectoris ([place]) - s/p CABG 2021 - cont statin  5. OSA on CPAP - cont CPAP  6. Gastroesophageal reflux disease, unspecified whether esophagitis present - stable with Prilosec  7. Osteoarthritis of multiple joints, unspecified osteoarthritis type - joint pain improved with ambulation- 300 ft today - scheduled spinal injection 02/13/2021- orders placed to hold Eliquis x 3 days - cont scheduled tylenol and tramadol prn  8. Stage 3 chronic kidney disease, unspecified whether stage 3a or 3b CKD ([place]) - cont to avoid nephrotoxic drugs like NSAIDS and dose adjust medications to  be renally excreted - encourage hydration with water  9. Slow transit constipation - LBM 01/11- per patient, abdomen soft - cont miralax    Family/ staff Communication: plan discussed with patient and nurse  Labs/tests ordered:  none

## 2021-01-23 ENCOUNTER — Encounter: Payer: Medicare Other | Admitting: Nurse Practitioner

## 2021-02-07 ENCOUNTER — Encounter: Payer: Self-pay | Admitting: Orthopedic Surgery

## 2021-02-07 ENCOUNTER — Non-Acute Institutional Stay (INDEPENDENT_AMBULATORY_CARE_PROVIDER_SITE_OTHER): Payer: Medicare Other | Admitting: Orthopedic Surgery

## 2021-02-07 DIAGNOSIS — Z0000 Encounter for general adult medical examination without abnormal findings: Secondary | ICD-10-CM | POA: Diagnosis not present

## 2021-02-07 NOTE — Progress Notes (Signed)
Subjective:   Stephanie Shea is a 88 y.o. female who presents for Medicare Annual (Subsequent) preventive examination.  Review of Systems     Cardiac Risk Factors include: advanced age (>63men, >12 women);hypertension;sedentary lifestyle;dyslipidemia     Objective:    Today's Vitals   02/07/21 1238  BP: 109/61  Pulse: 64  Resp: 20  Temp: 97.9 F (36.6 C)  SpO2: 98%  Weight: 180 lb 14.4 oz (82.1 kg)  Height: 5\' 7"  (1.702 m)   Body mass index is 28.33 kg/m.  Advanced Directives 02/07/2021 01/22/2021 01/17/2021 12/30/2020 12/24/2020 11/28/2020 11/26/2020  Does Patient Have a Medical Advance Directive? Yes Yes Yes Yes Yes Yes Yes  Type of Paramedic of [place];Out of facility DNR (pink MOST or yellow form);Living will [place];Out of facility DNR (pink MOST or yellow form);Living will [place];Out of facility DNR (pink MOST or yellow form) [place];Out of facility DNR (pink MOST or yellow form) [place];Out of facility DNR (pink MOST or yellow form) [place];Out of facility DNR (pink MOST or yellow form) [place];Out of facility DNR (pink MOST or yellow form)  Does patient want to make changes to medical advance directive? No - Patient declined No - Patient declined No - Patient declined No - Patient declined No - Patient declined No - Patient declined No - Patient declined  Copy of [place] in Chart? Yes - validated most recent copy scanned in chart (See row information) Yes - validated most recent copy scanned in chart (See row information) Yes - validated most recent copy scanned in chart (See row information) Yes - validated most recent copy scanned in chart (See row information) Yes - validated most recent copy scanned in chart (See row information) Yes - validated most recent copy scanned in chart (See row information) Yes -  validated most recent copy scanned in chart (See row information)  Pre-existing out of facility DNR order (yellow form or pink MOST form) Yellow form placed in chart (order not valid for inpatient use);Pink MOST form placed in chart (order not valid for inpatient use) Yellow form placed in chart (order not valid for inpatient use);Pink MOST form placed in chart (order not valid for inpatient use) Yellow form placed in chart (order not valid for inpatient use);Pink MOST form placed in chart (order not valid for inpatient use) - Yellow form placed in chart (order not valid for inpatient use);Pink MOST form placed in chart (order not valid for inpatient use) Yellow form placed in chart (order not valid for inpatient use);Pink MOST form placed in chart (order not valid for inpatient use) Yellow form placed in chart (order not valid for inpatient use);Pink MOST form placed in chart (order not valid for inpatient use)    Current Medications (verified) Outpatient Encounter Medications as of 02/07/2021  Medication Sig   acetaminophen (TYLENOL) 325 MG tablet Take 650 mg by mouth 3 (three) times daily.   amiodarone (PACERONE) 200 MG tablet Take 0.5 tablets (100 mg total) by mouth daily.   ammonium lactate (AMLACTIN) 12 % cream Apply topically as needed for dry skin.   apixaban (ELIQUIS) 5 MG TABS tablet Take 1 tablet (5 mg total) by mouth 2 (two) times daily.   atorvastatin (LIPITOR) 80 MG tablet Take 1 tablet (80 mg total) by mouth daily.   Cholecalciferol (VITAMIN D3) 50 MCG (2000 UT) TABS Take 2,000 Units by mouth daily  after breakfast.   docusate sodium (COLACE) 100 MG capsule Take 100 mg by mouth daily.   enalapril (VASOTEC) 10 MG tablet Take 1 tablet (10 mg total) by mouth daily.   fexofenadine (ALLEGRA) 180 MG tablet Take 180 mg by mouth daily as needed for allergies or rhinitis.    fluticasone (FLONASE) 50 MCG/ACT nasal spray Place 1 spray into both nostrils daily as needed for allergies or rhinitis.     metoprolol succinate (TOPROL-XL) 50 MG 24 hr tablet Take 1 tablet (50 mg total) by mouth daily. Take with or immediately following a meal.   nitroGLYCERIN (NITROSTAT) 0.4 MG SL tablet PLACE 1 TABLET UNDER TONGUE EVERY 5 MINUTES FOR 3 DOSES FOR CHEST PAIN   omeprazole (PRILOSEC OTC) 20 MG tablet Take 20 mg by mouth daily as needed.   polyethylene glycol (MIRALAX / GLYCOLAX) 17 g packet Take 17 g by mouth daily.   traMADol (ULTRAM) 50 MG tablet Take 1 tablet (50 mg total) by mouth every 6 (six) hours as needed.   zinc oxide 20 % ointment Apply 1 application topically 3 (three) times daily as needed for irritation. Apply to buttocks after every incontinent episode and as needed for redness.   No facility-administered encounter medications on file as of 02/07/2021.    Allergies (verified) Codeine   History: Past Medical History:  Diagnosis Date   Atherosclerosis of native coronary artery 2001   LAD & Cx disease -- BMS PCI in 2001; CABG 2002.  PCI in 2006 (per-op TAH)   Atrial fibrillation and flutter ([place]) 04/2019   New diagnosis-major complaint was chest tightness and dyspnea.   Breast cancer ([place])    Cervix dysplasia    GERD (gastroesophageal reflux disease)    Heart attack ([place]) 2002, 2006   Both events reportedly were perioperatively. She has no recollection. Initial PCI was related to angina symptoms (bilateral arm pain)   High cholesterol    Hypertension    Lumbosacral spinal stenosis    Osteoarthritis    Skin cancer    Sleep apnea    Uses CPAP faithfully   Squamous cell carcinoma of skin 02/12/2020   in situ- left neck-anterior (CX35FU)   Past Surgical History:  Procedure Laterality Date   ABDOMINAL HYSTERECTOMY     bone density     CARDIAC CATHETERIZATION  2001    Dr. Glade Lloyd [facility]) 2 vessel CAD involving LAD-D1 and distal Cx-OM. Appearance of dilated tapered left main with no significant atherosclerosis on IVUS --  BMS PCI of pLAD ([place] BMS 3.0 mm x 15 mm) &  with PTCA of ostial D1, PTCA of dLAD.  BMS PCI pCx [facility] BMS 3.5 mm x 11 mm) - unable to reopen dCx-OM (probable distal Atheroembolic occlusion.  Normal LVEF.   COLONOSCOPY     CORONARY ARTERY BYPASS GRAFT  2002   Unsure of details [facility])   [place]  2001   ([place]) [place] BMS PCI pLAD (3.0 mm x 15 mm) -PTCA of jailed D1 as well as distal LAD; pCx ([place] BMS 3.5 mm x 11 mm) -unable to open distalCx-OM -thromboembolic occlusion   DIAGNOSTIC MAMMOGRAM     LEFT HEART CATH AND CORS/GRAFTS ANGIOGRAPHY N/A 04/25/2019   Procedure: LEFT HEART CATH AND CORS/GRAFTS ANGIOGRAPHY;  Surgeon: Troy Sine, MD;  Location: [facility] INVASIVE CV LAB;;; ost-prox LCx stent @ OM1 ostium =100% CTO, Ost-prox LAD stent ~30% ISR, Ost D1 CTO, prox RCA ~30%. SCG-OM2 patent with ostial stent 30%,  LIMA-LAD patent; CTO of SVG-D1.    left knee replacement     MASTECTOMY     NM MYOVIEW LTD  12/2014; 2018   Cardiologist (Dr. Kellie Simmering.  [place], [place] ph# [phone]) -->LEXISCAN: Nonischemic, "normal "   pap smear     tonsilectomy     TRANSTHORACIC ECHOCARDIOGRAM  12/2014   Echocardiogram 12/25/14- normal LVEF and systolic function, EF 35-36%, mild grade 1 diastolic dysfunction   TRANSTHORACIC ECHOCARDIOGRAM  04/24/2019   EF 65 to 70%.  Moderate septal LVH.  GR 1 DD-elevated EDP.  Normal PAP.  Relatively normal valves.  No aortic stenosis.   Family History  Problem Relation Age of Onset   Stroke Mother    Heart disease Father    Social History   Socioeconomic History   Marital status: Widowed    Spouse name: Not on file   Number of children: Not on file   Years of education: Not on file   Highest education level: Not on file  Occupational History   Not on file  Tobacco Use   Smoking status: Never   Smokeless tobacco: Never  Vaping Use   Vaping Use: Never used  Substance and Sexual Activity   Alcohol use: No   Drug use: No   Sexual activity: Not Currently  Other Topics  Concern   Not on file  Social History Narrative   Recently moved back to [place] (lives alone in a [place] apartment at [facility])   Do you drink/eat things with caffeine? 2 cups of coffee every day   What year were you married? [place] - twice widowed   No pets.      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 Has POA      Johnny Bridge (Daughter) [phone])      Primary Emergency Contact: Viar,JOHN L   Address: [place],  [place] [address]   Home Phone: [phone]   Social Determinants of Health   Financial Resource Strain: Low Risk    Difficulty of Paying Living Expenses: Not hard at all  Food Insecurity: No Food Insecurity   Worried About [place] in the Last Year: Never true   [place] in the Last Year: Never true  Transportation Needs: No Transportation Needs   Lack of Transportation (Medical): No   Lack of Transportation (Non-Medical): No  Physical Activity: Sufficiently Active   Days of Exercise per Week: 5 days   Minutes of Exercise per Session: 30 min  Stress: No Stress Concern Present   Feeling of Stress : Only a little  Social Connections: Moderately Isolated   Frequency of Communication with Friends and Family: More than three times a week   Frequency of Social Gatherings with Friends and Family: More than three times a week   Attends Religious Services: More than 4 times per year   Active Member of [employer] or Organizations: No   Attends Archivist Meetings: Never   Marital Status: Widowed    Tobacco Counseling Counseling given: Not Answered   Clinical Intake:  Pre-visit preparation completed: No  Pain : No/denies pain     BMI - recorded: 28.33 Nutritional Status: BMI 25 -29 Overweight Nutritional Risks: None Diabetes: No  How often do you need to have someone help you when you read  instructions, pamphlets, or other written materials from your doctor or pharmacy?: 3 - Sometimes What is the last grade level you completed in school?: [place] college degree  Diabetic?No  Interpreter Needed?: No      Activities of Daily Living In your present state of health, do you have any difficulty performing the following activities: 02/07/2021  Hearing? Y  Vision? N  Difficulty concentrating or making decisions? N  Walking or climbing stairs? Y  Dressing or bathing? N  Doing errands, shopping? Y  Preparing Food and eating ? Y  Using the Toilet? Y  In the past six months, have you accidently leaked urine? Y  Do you have problems with loss of bowel control? N  Managing your Medications? Y  Managing your Finances? N  Housekeeping or managing your Housekeeping? Y  Some recent data might be hidden    Patient Care Team: Mast, [name], NP as PCP - General (Internal Medicine) Leonie Man, MD as PCP - Cardiology (Cardiology) Mast, [name], NP as Nurse Practitioner (Internal Medicine) Lavonna Monarch, MD as Consulting Physician (Dermatology)  Indicate any recent Medical Services you may have received from other than [facility] providers in the past year (date may be approximate).     Assessment:   This is a routine wellness examination for Agatha.  Hearing/Vision screen No results found.  Dietary issues and exercise activities discussed: Current Exercise Habits: Structured exercise class (enrolled in PT/OT), Type of exercise: strength training/weights;walking, Time (Minutes): 30, Frequency (Times/Week): 5, Weekly Exercise (Minutes/Week): 150, Intensity: Mild, Exercise limited by: cardiac condition(s);orthopedic condition(s);neurologic condition(s)   Goals Addressed               This Visit's Progress     DIET - REDUCE PORTION SIZE   On track     DIET - REDUCE SUGAR INTAKE   On track     Increase physical activity   On track     Maintain Mobility and Function  (pt-stated)   On track     Evidence-based guidance:  Acknowledge and validate impact of pain, loss of strength and potential disfigurement (hand osteoarthritis) on mental health and daily life, such as social isolation, anxiety, depression, impaired sexual relationship and   injury from falls.  Anticipate referral to physical or occupational therapy for assessment, therapeutic exercise and recommendation for adaptive equipment or assistive devices; encourage participation.  Assess impact on ability to perform activities of daily living, as well as engage in sports and leisure events or requirements of work or school.  Provide anticipatory guidance and reassurance about the benefit of exercise to maintain function; acknowledge and normalize fear that exercise may worsen symptoms.  Encourage regular exercise, at least 10 minutes at a time for 45 minutes per week; consider yoga, water exercise and proprioceptive exercises; encourage use of wearable activity tracker to increase motivation and adherence.  Encourage maintenance or resumption of daily activities, including employment, as pain allows and with minimal exposure to trauma.  Assist patient to advocate for adaptations to the work environment.  Consider level of pain and function, gender, age, lifestyle, patient preference, quality of life, readiness and capacity to benefit when recommending patients for orthopaedic surgery consultation.  Explore strategies, such as changes to medication regimen or activity that enables patient to anticipate and manage flare-ups that increase deconditioning and disability.  Explore patient preferences; encourage exposure to a broader range of activities that have been avoided for fear of experiencing pain.  Identify barriers to participation in therapy or  exercise, such as pain with activity, anticipated or imagined pain.  Monitor postoperative joint replacement or any preexisting joint replacement for ongoing  pain and loss of function; provide social support and encouragement throughout recovery.   Notes:        Depression Screen PHQ 2/9 Scores 02/07/2021 10/17/2020 08/19/2018 03/12/2017  PHQ - 2 Score 0 0 0 0  PHQ- 9 Score - - 0 -    Fall Risk Fall Risk  02/07/2021 10/17/2020 09/12/2020 05/25/2019 02/03/2019  Falls in the past year? 1 0 0 0 0  Number falls in past yr: 0 0 0 0 0  Injury with Fall? 0 0 0 - -  Risk for fall due to : History of fall(s);Impaired balance/gait;Impaired mobility;Orthopedic patient No Fall Risks No Fall Risks - -  Risk for fall due to: Comment spinal stenosis of lumbar - - - -  Follow up Falls evaluation completed;Education provided;Falls prevention discussed Falls evaluation completed Falls evaluation completed - -    FALL RISK PREVENTION PERTAINING TO THE HOME:  Any stairs in or around the home? No  If so, are there any without handrails? No  Home free of loose throw rugs in walkways, pet beds, electrical cords, etc? Yes  Adequate lighting in your home to reduce risk of falls? Yes   ASSISTIVE DEVICES UTILIZED TO PREVENT FALLS:  Life alert? No  Use of a cane, walker or w/c? Yes  Grab bars in the bathroom? Yes  Shower chair or bench in shower? Yes  Elevated toilet seat or a handicapped toilet? Yes   TIMED UP AND GO:  Was the test performed? No .  Length of time to ambulate 10 feet: N/A sec.   Gait slow and steady with assistive device  Cognitive Function: MMSE - Mini Mental State Exam 02/07/2021 09/29/2018 12/30/2017 12/01/2016 12/01/2016  Not completed: - (No Data) (No Data) - (No Data)  Orientation to time 4 4 4 5 5   Orientation to Place 5 5 5 5 5   Registration 3 3 3 3 3   Attention/ Calculation 4 5 5 5 5   Recall 3 3 3 3 3   Language- name 2 objects 2 2 2 2 2   Language- repeat 1 1 1 1 1   Language- follow 3 step command 3 3 1 3 3   Language- read & follow direction 1 1 1 1 1   Write a sentence 1 1 1 1 1   Copy design 1 1 1 1 1   Total score 28 29 27 30 30       6CIT Screen 02/07/2021  What Year? 4 points  What month? 0 points  What time? 0 points  Count back from 20 2 points  Months in reverse 0 points  Repeat phrase 0 points  Total Score 6    Immunizations Immunization History  Administered Date(s) Administered   Fluad Quad(high Dose 65+) 10/28/2020   Influenza, High Dose Seasonal PF 10/21/2016, 09/24/2017, 10/25/2019   Influenza-Unspecified 10/03/2011, 10/04/2012, 01/13/2016   Moderna SARS-COV2 Booster Vaccination 11/21/2019, 06/11/2020   Moderna Sars-Covid-2 Vaccination 01/16/2019, 02/13/2019, 03/13/2019   Pneumococcal-Unspecified 01/13/2016   Zoster Recombinat (Shingrix) 08/24/2018, 02/22/2020    TDAP status: Up to date  Flu Vaccine status: Up to date  Pneumococcal vaccine status: Due, Education has been provided regarding the importance of this vaccine. Advised may receive this vaccine at local pharmacy or Health Dept. Aware to provide a copy of the vaccination record if obtained from local pharmacy or Health Dept. Verbalized acceptance and understanding.  Covid-19 vaccine  status: Completed vaccines  Qualifies for Shingles Vaccine? Yes   Zostavax completed No   Shingrix Completed?: Yes  Screening Tests Health Maintenance  Topic Date Due   Pneumonia Vaccine 52+ Years old (1 - PCV) 10/27/1939   TETANUS/TDAP  Never done   HEMOGLOBIN A1C  12/01/2019   COVID-19 Vaccine (4 - Booster for Moderna series) 08/06/2020   OPHTHALMOLOGY EXAM  05/30/2021   FOOT EXAM  10/17/2021   INFLUENZA VACCINE  Completed   DEXA SCAN  Completed   Zoster Vaccines- Shingrix  Completed   HPV VACCINES  Aged Out    Health Maintenance  Health Maintenance Due  Topic Date Due   Pneumonia Vaccine 80+ Years old (1 - PCV) 10/27/1939   TETANUS/TDAP  Never done   HEMOGLOBIN A1C  12/01/2019   COVID-19 Vaccine (4 - Booster for Moderna series) 08/06/2020    Colorectal cancer screening: No longer required.   Mammogram status: No longer required  due to advanced age- last done 2021.  Bone Density status: Completed 2017. Results reflect: Bone density results: OSTEOPENIA. Repeat every 2 years.  Lung Cancer Screening: (Low Dose CT Chest recommended if Age 64-80 years, 30 pack-year currently smoking OR have quit w/in 15years.) does not qualify.   Lung Cancer Screening Referral: No  Additional Screening:  Hepatitis C Screening: does not qualify; advanced age  Vision Screening: Recommended annual ophthalmology exams for early detection of glaucoma and other disorders of the eye. Is the patient up to date with their annual eye exam?  Yes  Who is the provider or what is the name of the office in which the patient attends annual eye exams? Cannot remember name If pt is not established with a provider, would they like to be referred to a provider to establish care? No .   Dental Screening: Recommended annual dental exams for proper oral hygiene  Community Resource Referral / Chronic Care Management: CRR required this visit?  No   CCM required this visit?  No      Plan:     I have personally reviewed and noted the following in the patients chart:   Medical and social history Use of alcohol, tobacco or illicit drugs  Current medications and supplements including opioid prescriptions.  Functional ability and status Nutritional status Physical activity Advanced directives List of other physicians Hospitalizations, surgeries, and ER visits in previous 12 months Vitals Screenings to include cognitive, depression, and falls Referrals and appointments  In addition, I have reviewed and discussed with patient certain preventive protocols, quality metrics, and best practice recommendations. A written personalized care plan for preventive services as well as general preventive health recommendations were provided to patient.     Yvonna Alanis, NP   02/07/2021

## 2021-02-07 NOTE — Patient Instructions (Signed)
°  Stephanie Shea , Thank you for taking time to come for your Medicare Wellness Visit. I appreciate your ongoing commitment to your health goals. Please review the following plan we discussed and let me know if I can assist you in the future.   These are the goals we discussed:  Goals       DIET - REDUCE PORTION SIZE      DIET - REDUCE SUGAR INTAKE      Increase physical activity      Maintain Mobility and Function (pt-stated)      Evidence-based guidance:  Acknowledge and validate impact of pain, loss of strength and potential disfigurement (hand osteoarthritis) on mental health and daily life, such as social isolation, anxiety, depression, impaired sexual relationship and   injury from falls.  Anticipate referral to physical or occupational therapy for assessment, therapeutic exercise and recommendation for adaptive equipment or assistive devices; encourage participation.  Assess impact on ability to perform activities of daily living, as well as engage in sports and leisure events or requirements of work or school.  Provide anticipatory guidance and reassurance about the benefit of exercise to maintain function; acknowledge and normalize fear that exercise may worsen symptoms.  Encourage regular exercise, at least 10 minutes at a time for 45 minutes per week; consider yoga, water exercise and proprioceptive exercises; encourage use of wearable activity tracker to increase motivation and adherence.  Encourage maintenance or resumption of daily activities, including employment, as pain allows and with minimal exposure to trauma.  Assist patient to advocate for adaptations to the work environment.  Consider level of pain and function, gender, age, lifestyle, patient preference, quality of life, readiness and capacity to benefit when recommending patients for orthopaedic surgery consultation.  Explore strategies, such as changes to medication regimen or activity that enables patient to  anticipate and manage flare-ups that increase deconditioning and disability.  Explore patient preferences; encourage exposure to a broader range of activities that have been avoided for fear of experiencing pain.  Identify barriers to participation in therapy or exercise, such as pain with activity, anticipated or imagined pain.  Monitor postoperative joint replacement or any preexisting joint replacement for ongoing pain and loss of function; provide social support and encouragement throughout recovery.   Notes:         This is a list of the screening recommended for you and due dates:  Health Maintenance  Topic Date Due   Pneumonia Vaccine (1 - PCV) 10/27/1939   Hemoglobin A1C  12/01/2019   COVID-19 Vaccine (4 - Booster for Moderna series) 08/06/2020   Eye exam for diabetics  05/30/2021   Complete foot exam   10/17/2021   Tetanus Vaccine  12/02/2027   Flu Shot  Completed   DEXA scan (bone density measurement)  Completed   Zoster (Shingles) Vaccine  Completed   HPV Vaccine  Aged Out

## 2021-02-12 ENCOUNTER — Ambulatory Visit: Payer: Medicare Other | Admitting: Dermatology

## 2021-02-13 ENCOUNTER — Non-Acute Institutional Stay (SKILLED_NURSING_FACILITY): Payer: Medicare Other | Admitting: Internal Medicine

## 2021-02-13 ENCOUNTER — Encounter: Payer: Self-pay | Admitting: Internal Medicine

## 2021-02-13 DIAGNOSIS — I480 Paroxysmal atrial fibrillation: Secondary | ICD-10-CM | POA: Diagnosis not present

## 2021-02-13 DIAGNOSIS — G3184 Mild cognitive impairment, so stated: Secondary | ICD-10-CM

## 2021-02-13 DIAGNOSIS — G4733 Obstructive sleep apnea (adult) (pediatric): Secondary | ICD-10-CM

## 2021-02-13 DIAGNOSIS — M48061 Spinal stenosis, lumbar region without neurogenic claudication: Secondary | ICD-10-CM

## 2021-02-13 DIAGNOSIS — N183 Chronic kidney disease, stage 3 unspecified: Secondary | ICD-10-CM | POA: Diagnosis not present

## 2021-02-13 DIAGNOSIS — M545 Low back pain, unspecified: Secondary | ICD-10-CM

## 2021-02-13 DIAGNOSIS — K219 Gastro-esophageal reflux disease without esophagitis: Secondary | ICD-10-CM

## 2021-02-13 DIAGNOSIS — G8929 Other chronic pain: Secondary | ICD-10-CM

## 2021-02-13 DIAGNOSIS — I25709 Atherosclerosis of coronary artery bypass graft(s), unspecified, with unspecified angina pectoris: Secondary | ICD-10-CM | POA: Diagnosis not present

## 2021-02-13 DIAGNOSIS — R600 Localized edema: Secondary | ICD-10-CM | POA: Diagnosis not present

## 2021-02-13 DIAGNOSIS — M48062 Spinal stenosis, lumbar region with neurogenic claudication: Secondary | ICD-10-CM | POA: Diagnosis not present

## 2021-02-13 DIAGNOSIS — M25561 Pain in right knee: Secondary | ICD-10-CM

## 2021-02-13 DIAGNOSIS — I10 Essential (primary) hypertension: Secondary | ICD-10-CM

## 2021-02-13 DIAGNOSIS — Z9989 Dependence on other enabling machines and devices: Secondary | ICD-10-CM

## 2021-02-13 NOTE — Progress Notes (Signed)
Location:   [facility] Room Number: 37 Place of Service:  SNF [phone]) Provider:  Veleta Miners MD  Mast, [name], NP  Patient Care Team: Mast, [name], NP as PCP - General (Internal Medicine) Leonie Man, MD as PCP - Cardiology (Cardiology) Mast, [name], NP as Nurse Practitioner (Internal Medicine) Lavonna Monarch, MD as Consulting Physician (Dermatology)  Extended Emergency Contact Information Primary Emergency Contact: [place] Mobile Phone: [phone] Relation: Daughter Secondary Emergency Contact: Weeks,Tiffany Mobile Phone: [phone] Relation: Daughter Preferred language: English Interpreter needed? No  Code Status:  Full Code Goals of care: Advanced Directive information Advanced Directives 02/13/2021  Does Patient Have a Medical Advance Directive? Yes  Type of Paramedic of [place];Out of facility DNR (pink MOST or yellow form);Living will  Does patient want to make changes to medical advance directive? No - Patient declined  Copy of [place] in Chart? Yes - validated most recent copy scanned in chart (See row information)  Pre-existing out of facility DNR order (yellow form or pink MOST form) Yellow form placed in chart (order not valid for inpatient use);Pink MOST form placed in chart (order not valid for inpatient use)     Chief Complaint  Patient presents with   Medical Management of Chronic Issues   Quality Metric Gaps    Pneumonia Vaccine 53+ Years old (1 - PCV) HEMOGLOBIN A1C (Every 6 Months)       HPI:  Pt is a 88 y.o. female seen today for medical management of chronic diseases.    Patient has a history of CAD s/p CABG in 2002 S/p cath in 04/21 which showed occluded SVG to Diag but patent LIMA to LAD and SVG to CX.EF. On Medical Management EF 65% PAF diagnosed in 4/21 on Eliquis Sleep Apnea uses CPAP H/I Skin Cancer H/o Breast Cancer s/p Mastectomy  HTN,HLD and  Arthritis Cognitive impairment LE Edema  Patient was initially admitted for right knee pain after a fall. Xrays Negative for fractures Was unable to do her ADLs at home.  Was admitted in SNF.   Patient also had an MRI 11/07 of her spine done few before weeks before her fall which showed disc bulge at L 3 to L5 Today she went and got back Steroid injections. Per patient she has been doing very well with therapy.  Denies any pain now in her knees.  Does not have any back pain either.  Is able to walk with her walker short distance and mild assist.  Is able to do her transfers to wheelchair.  She wants to know if she can go home.  The plan for therapy is to transition to [facility].    Past Medical History:  Diagnosis Date   Atherosclerosis of native coronary artery 2001   LAD & Cx disease -- BMS PCI in 2001; CABG 2002.  PCI in 2006 (per-op TAH)   Atrial fibrillation and flutter ([place]) 04/2019   New diagnosis-major complaint was chest tightness and dyspnea.   Breast cancer ([place])    Cervix dysplasia    GERD (gastroesophageal reflux disease)    Heart attack ([place]) 2002, 2006   Both events reportedly were perioperatively. She has no recollection. Initial PCI was related to angina symptoms (bilateral arm pain)   High cholesterol    Hypertension    Lumbosacral spinal stenosis    Osteoarthritis    Skin cancer    Sleep apnea    Uses CPAP faithfully  Squamous cell carcinoma of skin 02/12/2020   in situ- left neck-anterior (CX35FU)   Past Surgical History:  Procedure Laterality Date   ABDOMINAL HYSTERECTOMY     bone density     CARDIAC CATHETERIZATION  2001    Dr. Glade Lloyd [facility]) 2 vessel CAD involving LAD-D1 and distal Cx-OM. Appearance of dilated tapered left main with no significant atherosclerosis on IVUS --  BMS PCI of pLAD ([place] BMS 3.0 mm x 15 mm) & with PTCA of ostial D1, PTCA of dLAD.  BMS PCI pCx [facility] BMS 3.5 mm x 11 mm) - unable to reopen dCx-OM (probable distal Atheroembolic  occlusion.  Normal LVEF.   COLONOSCOPY     CORONARY ARTERY BYPASS GRAFT  2002   Unsure of details [facility] in [facility])   [place]  2001   ([place]) [place] BMS PCI pLAD (3.0 mm x 15 mm) -PTCA of jailed D1 as well as distal LAD; pCx ([facility]ale BMS 3.5 mm x 11 mm) -unable to open distalCx-OM -thromboembolic occlusion   DIAGNOSTIC MAMMOGRAM     LEFT HEART CATH AND CORS/GRAFTS ANGIOGRAPHY N/A 04/25/2019   Procedure: LEFT HEART CATH AND CORS/GRAFTS ANGIOGRAPHY;  Surgeon: Troy Sine, MD;  Location: [facility] INVASIVE CV LAB;;; ost-prox LCx stent @ OM1 ostium =100% CTO, Ost-prox LAD stent ~30% ISR, Ost D1 CTO, prox RCA ~30%. SCG-OM2 patent with ostial stent 30%, LIMA-LAD patent; CTO of SVG-D1.    left knee replacement     MASTECTOMY     NM MYOVIEW LTD  12/2014; 2018   Cardiologist (Dr. Kellie Simmering.  [place], [place] ph# [phone]) -->LEXISCAN: Nonischemic, "normal "   pap smear     tonsilectomy     TRANSTHORACIC ECHOCARDIOGRAM  12/2014   Echocardiogram 12/25/14- normal LVEF and systolic function, EF 21-19%, mild grade 1 diastolic dysfunction   TRANSTHORACIC ECHOCARDIOGRAM  04/24/2019   EF 65 to 70%.  Moderate septal LVH.  GR 1 DD-elevated EDP.  Normal PAP.  Relatively normal valves.  No aortic stenosis.    Allergies  Allergen Reactions   Codeine Nausea Only    Allergies as of 02/13/2021       Reactions   Codeine Nausea Only        Medication List        Accurate as of February 13, 2021 10:09 AM. If you have any questions, ask your nurse or doctor.          acetaminophen 500 MG tablet Commonly known as: TYLENOL Take 1,000 mg by mouth 2 (two) times daily.   amiodarone 200 MG tablet Commonly known as: PACERONE Take 0.5 tablets (100 mg total) by mouth daily.   ammonium lactate 12 % cream Commonly known as: AMLACTIN Apply topically as needed for dry skin.   apixaban 5 MG Tabs tablet Commonly known as: Eliquis Take 1 tablet (5 mg total) by mouth 2 (two) times  daily.   atorvastatin 80 MG tablet Commonly known as: LIPITOR Take 1 tablet (80 mg total) by mouth daily.   docusate sodium 100 MG capsule Commonly known as: COLACE Take 100 mg by mouth daily.   enalapril 10 MG tablet Commonly known as: VASOTEC Take 1 tablet (10 mg total) by mouth daily.   fexofenadine 180 MG tablet Commonly known as: ALLEGRA Take 180 mg by mouth daily as needed for allergies or rhinitis.   fluticasone 50 MCG/ACT nasal spray Commonly known as: FLONASE Place 1 spray into both nostrils daily as needed for allergies or rhinitis.  metoprolol succinate 50 MG 24 hr tablet Commonly known as: TOPROL-XL Take 1 tablet (50 mg total) by mouth daily. Take with or immediately following a meal.   nitroGLYCERIN 0.4 MG SL tablet Commonly known as: NITROSTAT PLACE 1 TABLET UNDER TONGUE EVERY 5 MINUTES FOR 3 DOSES FOR CHEST PAIN   omeprazole 20 MG tablet Commonly known as: PRILOSEC OTC Take 20 mg by mouth daily as needed.   polyethylene glycol 17 g packet Commonly known as: MIRALAX / GLYCOLAX Take 17 g by mouth daily.   traMADol 50 MG tablet Commonly known as: Ultram Take 1 tablet (50 mg total) by mouth every 6 (six) hours as needed.   Vitamin D3 50 MCG (2000 UT) Tabs Take 2,000 Units by mouth daily after breakfast.   zinc oxide 20 % ointment Apply 1 application topically 3 (three) times daily as needed for irritation. Apply to buttocks after every incontinent episode and as needed for redness.        Review of Systems  Constitutional:  Negative for activity change and appetite change.  HENT: Negative.    Respiratory:  Negative for cough and shortness of breath.   Cardiovascular:  Negative for leg swelling.  Gastrointestinal:  Negative for constipation.  Genitourinary: Negative.   Musculoskeletal:  Negative for arthralgias, gait problem and myalgias.  Skin: Negative.   Neurological:  Negative for dizziness and weakness.  Psychiatric/Behavioral:  Negative  for confusion, dysphoric mood and sleep disturbance.    Immunization History  Administered Date(s) Administered   Fluad Quad(high Dose 65+) 10/28/2020   Influenza, High Dose Seasonal PF 10/21/2016, 09/24/2017, 10/25/2019   Influenza-Unspecified 10/03/2011, 10/04/2012, 01/13/2016   Moderna SARS-COV2 Booster Vaccination 11/21/2019, 06/11/2020   Moderna Sars-Covid-2 Vaccination 01/16/2019, 02/13/2019, 03/13/2019   Pneumococcal-Unspecified 01/13/2016   Tetanus 12/01/2017   Zoster Recombinat (Shingrix) 08/24/2018, 02/22/2020   Pertinent  Health Maintenance Due  Topic Date Due   HEMOGLOBIN A1C  12/01/2019   OPHTHALMOLOGY EXAM  05/30/2021   FOOT EXAM  10/17/2021   INFLUENZA VACCINE  Completed   DEXA SCAN  Completed   Fall Risk 04/25/2019 05/25/2019 09/12/2020 10/17/2020 02/07/2021  Falls in the past year? - 0 0 0 1  Was there an injury with Fall? - - 0 0 0  Fall Risk Category Calculator - - 0 0 1  Fall Risk Category - - Low Low Low  Patient Fall Risk Level Moderate fall risk - Low fall risk Low fall risk Moderate fall risk  Patient at Risk for Falls Due to - - No Fall Risks No Fall Risks History of fall(s);Impaired balance/gait;Impaired mobility;Orthopedic patient  Patient at Risk for Falls Due to - - - - spinal stenosis of lumbar  Fall risk Follow up - - Falls evaluation completed Falls evaluation completed Falls evaluation completed;Education provided;Falls prevention discussed   Functional Status Survey:    Vitals:   02/13/21 1000  BP: 107/63  Pulse: 61  Resp: 18  Temp: 98.1 F (36.7 C)  SpO2: 99%  Weight: 180 lb 14.4 oz (82.1 kg)  Height: 5\' 7"  (1.702 m)   Body mass index is 28.33 kg/m. Physical Exam Vitals reviewed.  Constitutional:      Appearance: Normal appearance.  HENT:     Head: Normocephalic.     Nose: Nose normal.     Mouth/Throat:     Mouth: Mucous membranes are moist.     Pharynx: Oropharynx is clear.  Eyes:     Pupils: Pupils are equal, round, and reactive  to light.  Cardiovascular:     Rate and Rhythm: Normal rate and regular rhythm.     Pulses: Normal pulses.     Heart sounds: Normal heart sounds. No murmur heard. Pulmonary:     Effort: Pulmonary effort is normal.     Breath sounds: Normal breath sounds.  Abdominal:     General: Abdomen is flat. Bowel sounds are normal.     Palpations: Abdomen is soft.  Musculoskeletal:        General: Swelling present.     Cervical back: Neck supple.  Skin:    General: Skin is warm.  Neurological:     General: No focal deficit present.     Mental Status: She is alert and oriented to person, place, and time.  Psychiatric:        Mood and Affect: Mood normal.        Thought Content: Thought content normal.    Labs reviewed: Recent Labs    04/05/20 0000 12/02/20 0000  NA 142 142  K 4.5 4.7  CL 101 106  CO2 24 28*  GLUCOSE 101*  --   BUN 15 20  CREATININE 1.06* 0.9  CALCIUM 10.0 9.4   Recent Labs    04/05/20 0000 12/02/20 0000  AST 31 17  ALT 40* 15  ALKPHOS 54 43  BILITOT 1.0  --   PROT 7.5  --   ALBUMIN 4.4 3.6   Recent Labs    04/05/20 0000 12/02/20 0000  WBC 5.4 6.0  NEUTROABS  --  10.70  HGB 13.3 12.4  HCT 39.9 37  MCV 98*  --   PLT 215 248   Lab Results  Component Value Date   TSH 1.510 04/05/2020   Lab Results  Component Value Date   HGBA1C 5.9 (H) 05/31/2019   Lab Results  Component Value Date   CHOL 169 04/05/2020   HDL 70 04/05/2020   LDLCALC 74 04/05/2020   TRIG 150 (H) 04/05/2020   CHOLHDL 2.4 04/05/2020    Significant Diagnostic Results in last 30 days:  No results found.  Assessment/Plan 1. Bilateral leg edema Will Hold off lasix right now She has been on Lasix PRN before 2. Paroxysmal atrial fibrillation/flutter (HCC) On Eliquis and Amiodarone and Toprol  3. Essential hypertension Stable on Toprol and Vasotec  4. Coronary artery disease I On Statin Follows with Cardiology 5. OSA on CPAP On CPAP  6. Gastroesophageal reflux  disease, unspecified whether esophagitis present On Prilosec  7. Acute pain of right knee Doing really well Walking now with no pain   8. Stage 3 chronic kidney disease, unspecified whether stage 3a or 3b CKD (HCC) On Creat   9. Chronic right-sided low back pain without sciatica due to stenosis S/p Injection per Ortho  10. MCI (mild cognitive impairment) Per Therapy will go to [facility] before discharge Speech did MMSE 27/30 Lost in orientation questions Getting confused about timing    Family/ staff Communication:   Labs/tests ordered:

## 2021-02-17 ENCOUNTER — Ambulatory Visit: Payer: Medicare Other | Admitting: Cardiology

## 2021-02-20 DIAGNOSIS — R278 Other lack of coordination: Secondary | ICD-10-CM | POA: Diagnosis not present

## 2021-02-20 DIAGNOSIS — Z9181 History of falling: Secondary | ICD-10-CM | POA: Diagnosis not present

## 2021-02-20 DIAGNOSIS — M1990 Unspecified osteoarthritis, unspecified site: Secondary | ICD-10-CM | POA: Diagnosis not present

## 2021-02-20 DIAGNOSIS — M6281 Muscle weakness (generalized): Secondary | ICD-10-CM | POA: Diagnosis not present

## 2021-02-20 DIAGNOSIS — R2689 Other abnormalities of gait and mobility: Secondary | ICD-10-CM | POA: Diagnosis not present

## 2021-02-21 DIAGNOSIS — M1990 Unspecified osteoarthritis, unspecified site: Secondary | ICD-10-CM | POA: Diagnosis not present

## 2021-02-21 DIAGNOSIS — R2689 Other abnormalities of gait and mobility: Secondary | ICD-10-CM | POA: Diagnosis not present

## 2021-02-21 DIAGNOSIS — M6281 Muscle weakness (generalized): Secondary | ICD-10-CM | POA: Diagnosis not present

## 2021-02-21 DIAGNOSIS — R278 Other lack of coordination: Secondary | ICD-10-CM | POA: Diagnosis not present

## 2021-02-21 DIAGNOSIS — Z9181 History of falling: Secondary | ICD-10-CM | POA: Diagnosis not present

## 2021-02-24 ENCOUNTER — Non-Acute Institutional Stay (SKILLED_NURSING_FACILITY): Payer: Medicare Other | Admitting: Nurse Practitioner

## 2021-02-24 ENCOUNTER — Encounter: Payer: Self-pay | Admitting: Nurse Practitioner

## 2021-02-24 DIAGNOSIS — I10 Essential (primary) hypertension: Secondary | ICD-10-CM

## 2021-02-24 DIAGNOSIS — M6281 Muscle weakness (generalized): Secondary | ICD-10-CM | POA: Diagnosis not present

## 2021-02-24 DIAGNOSIS — M1990 Unspecified osteoarthritis, unspecified site: Secondary | ICD-10-CM | POA: Diagnosis not present

## 2021-02-24 DIAGNOSIS — R278 Other lack of coordination: Secondary | ICD-10-CM | POA: Diagnosis not present

## 2021-02-24 DIAGNOSIS — I25709 Atherosclerosis of coronary artery bypass graft(s), unspecified, with unspecified angina pectoris: Secondary | ICD-10-CM | POA: Diagnosis not present

## 2021-02-24 DIAGNOSIS — R2689 Other abnormalities of gait and mobility: Secondary | ICD-10-CM | POA: Diagnosis not present

## 2021-02-24 DIAGNOSIS — M159 Polyosteoarthritis, unspecified: Secondary | ICD-10-CM

## 2021-02-24 DIAGNOSIS — R413 Other amnesia: Secondary | ICD-10-CM | POA: Diagnosis not present

## 2021-02-24 DIAGNOSIS — Z9181 History of falling: Secondary | ICD-10-CM | POA: Diagnosis not present

## 2021-02-24 DIAGNOSIS — N183 Chronic kidney disease, stage 3 unspecified: Secondary | ICD-10-CM | POA: Diagnosis not present

## 2021-02-24 DIAGNOSIS — Z9989 Dependence on other enabling machines and devices: Secondary | ICD-10-CM | POA: Diagnosis not present

## 2021-02-24 DIAGNOSIS — R609 Edema, unspecified: Secondary | ICD-10-CM | POA: Diagnosis not present

## 2021-02-24 DIAGNOSIS — K219 Gastro-esophageal reflux disease without esophagitis: Secondary | ICD-10-CM

## 2021-02-24 DIAGNOSIS — I480 Paroxysmal atrial fibrillation: Secondary | ICD-10-CM | POA: Diagnosis not present

## 2021-02-24 DIAGNOSIS — G4733 Obstructive sleep apnea (adult) (pediatric): Secondary | ICD-10-CM

## 2021-02-24 MED ORDER — OMEPRAZOLE MAGNESIUM 20 MG PO TBEC: 20.0000 mg | DELAYED_RELEASE_TABLET | Freq: Every day | ORAL | 0 refills | Status: AC | PRN

## 2021-02-24 MED ORDER — FEXOFENADINE HCL 180 MG PO TABS: 180.0000 mg | ORAL_TABLET | Freq: Every day | ORAL | 0 refills | Status: AC | PRN

## 2021-02-24 MED ORDER — METOPROLOL SUCCINATE ER 50 MG PO TB24: 50.0000 mg | ORAL_TABLET | Freq: Every day | ORAL | 0 refills | Status: AC

## 2021-02-24 MED ORDER — POLYETHYLENE GLYCOL 3350 17 G PO PACK: 17.0000 g | PACK | Freq: Every day | ORAL | 0 refills | Status: AC

## 2021-02-24 MED ORDER — NITROGLYCERIN 0.4 MG SL SUBL: SUBLINGUAL_TABLET | SUBLINGUAL | 0 refills | Status: DC

## 2021-02-24 MED ORDER — DOCUSATE SODIUM 100 MG PO CAPS: 100.0000 mg | ORAL_CAPSULE | Freq: Every day | ORAL | 0 refills | Status: AC

## 2021-02-24 MED ORDER — AMMONIUM LACTATE 12 % EX CREA: TOPICAL_CREAM | CUTANEOUS | 0 refills | Status: DC | PRN

## 2021-02-24 MED ORDER — ATORVASTATIN CALCIUM 80 MG PO TABS: 80.0000 mg | ORAL_TABLET | Freq: Every day | ORAL | 0 refills | Status: AC

## 2021-02-24 MED ORDER — ENALAPRIL MALEATE 10 MG PO TABS: 10.0000 mg | ORAL_TABLET | Freq: Every day | ORAL | 3 refills | Status: DC

## 2021-02-24 MED ORDER — APIXABAN 5 MG PO TABS: 5.0000 mg | ORAL_TABLET | Freq: Two times a day (BID) | ORAL | 0 refills | Status: AC

## 2021-02-24 MED ORDER — VITAMIN D3 50 MCG (2000 UT) PO TABS: 2000.0000 [IU] | ORAL_TABLET | Freq: Every day | ORAL | 0 refills | Status: AC

## 2021-02-24 MED ORDER — FLUTICASONE PROPIONATE 50 MCG/ACT NA SUSP: 1.0000 | Freq: Every day | NASAL | 0 refills | Status: AC | PRN

## 2021-02-24 MED ORDER — ZINC OXIDE 20 % EX OINT: 1.0000 "application " | TOPICAL_OINTMENT | Freq: Three times a day (TID) | CUTANEOUS | 0 refills | Status: AC | PRN

## 2021-02-24 MED ORDER — AMIODARONE HCL 200 MG PO TABS: 100.0000 mg | ORAL_TABLET | Freq: Every day | ORAL | 0 refills | Status: DC

## 2021-02-24 MED ORDER — ACETAMINOPHEN 500 MG PO TABS: 1000.0000 mg | ORAL_TABLET | Freq: Two times a day (BID) | ORAL | 0 refills | Status: DC

## 2021-02-24 NOTE — Assessment & Plan Note (Signed)
The right knee pain with weight bearing sustained from a fall on the bus 11/22/20. Saw Ortho Dr. Rhona Raider 11/22/20, negative X-ray, better now.  MRI 11/13/20 her back showed severer spinal and foraminal stenosis with left sciatica left knee s/p TKR, left hip pain, failed inj x2,  MR lumbar, severe spinal 11/13/20 spinal and foraminal stenosis.   On Tylenol, prn Tramadol.

## 2021-02-24 NOTE — Assessment & Plan Note (Signed)
Heart rate is in control, takes Metoprolol, Eliquis, Amiodarone, f/u cardiology. TSH 1.51 09/05/20

## 2021-02-24 NOTE — Assessment & Plan Note (Signed)
Blood pressure is controlled, takes Enalapril, Metoprolol.

## 2021-02-24 NOTE — Assessment & Plan Note (Signed)
BLE, mild, observe, Hx of prn Furosemide use.

## 2021-02-24 NOTE — Assessment & Plan Note (Signed)
stable, takes Omeprazole, Hgb 12.4 12/02/20

## 2021-02-24 NOTE — Assessment & Plan Note (Signed)
on CPAP ?

## 2021-02-24 NOTE — Assessment & Plan Note (Signed)
MMSE 27/30

## 2021-02-24 NOTE — Progress Notes (Signed)
Location:  [place] Room Number: N37/A Place of Service:  SNF (31)  Provider: Nellene Courtois X, NP  PCP: Billyjoe Go X, NP Patient Care Team: Jacole Capley X, NP as PCP - General (Internal Medicine) Leonie Mardella Nuckles, MD as PCP - Cardiology (Cardiology) Oliviarose Punch X, NP as Nurse Practitioner (Internal Medicine) Lavonna Monarch, MD as Consulting Physician (Dermatology)  Extended Emergency Contact Information Primary Emergency Contact: Camas Mobile Phone: [phone] Relation: Daughter Secondary Emergency Contact: Weeks,Tiffany Mobile Phone: [phone] Relation: Daughter Preferred language: English Interpreter needed? No  Code Status: DNR Goals of care:  Advanced Directive information Advanced Directives 02/24/2021  Does Patient Have a Medical Advance Directive? Yes  Type of Paramedic of [place];Out of facility DNR (pink MOST or yellow form);Living will  Does patient want to make changes to medical advance directive? No - Patient declined  Copy of [place] in Chart? Yes - validated most recent copy scanned in chart (See row information)  Pre-existing out of facility DNR order (yellow form or pink MOST form) Yellow form placed in chart (order not valid for inpatient use);Pink MOST form placed in chart (order not valid for inpatient use)     Allergies  Allergen Reactions   Codeine Nausea Only    Chief Complaint  Patient presents with   Discharge Note    Discharge to [facility]    HPI:  88 y.o. female with PMH significant of Afib, HTN, CAD, GERD, OA, skin cancer, mild cognitive deficit was admitted to [facility] for therapy following a fall with R knee pain, lower back pain.   The patient has recovered from pain/injury, regained her physical strength, ADL function, she is ready to transfer to [place] for continuation of therapy.     The right knee pain with weight bearing sustained from a fall on the bus 11/22/20. Saw  Ortho Dr. Rhona Raider 11/22/20, negative X-ray. On Tylenol, prn Tramadol.  MRI 11/13/20 her back showed severer spinal and foraminal stenosis with left sciatica             SCC was diagnosed 09/26/20, left forearm, electrodesiccation and curettage done.              Afib, takes Metoprolol, Eliquis, Amiodarone, f/u cardiology. TSH 1.51 09/05/20             HTN, takes Enalapril, Metoprolol.              GERD, stable, takes Omeprazole, Hgb 12.4 12/02/20             CAD, stable, Hx of CABG Cath 04/25/19, prn NTG, takes Atorvastatin. LDL 74 04/05/20             OA, left knee s/p TKR, left hip pain, failed inj x2,  MR lumbar, severe spinal 11/13/20 spinal and foraminal stenosis.              CKD Bun/creat 20//0.9 12/02/20             OSA, on CPAP             Cognitive impairment, MMSE 27/30  BLE edema, prn Furosemide in the past, mild.   Past Medical History:  Diagnosis Date   Atherosclerosis of native coronary artery 2001   LAD & Cx disease -- BMS PCI in 2001; CABG 2002.  PCI in 2006 (per-op TAH)   Atrial fibrillation and flutter ([place]) 04/2019   New diagnosis-major complaint was chest tightness and dyspnea.  Breast cancer ([place])    Cervix dysplasia    GERD (gastroesophageal reflux disease)    Heart attack ([place]) 2002, 2006   Both events reportedly were perioperatively. She has no recollection. Initial PCI was related to angina symptoms (bilateral arm pain)   High cholesterol    Hypertension    Lumbosacral spinal stenosis    Osteoarthritis    Skin cancer    Sleep apnea    Uses CPAP faithfully   Squamous cell carcinoma of skin 02/12/2020   in situ- left neck-anterior (CX35FU)    Past Surgical History:  Procedure Laterality Date   ABDOMINAL HYSTERECTOMY     bone density     CARDIAC CATHETERIZATION  2001    Dr. Glade Lloyd [facility]) 2 vessel CAD involving LAD-D1 and distal Cx-OM. Appearance of dilated tapered left main with no significant atherosclerosis on IVUS --  BMS PCI of pLAD ([facility] BMS  3.0 mm x 15 mm) & with PTCA of ostial D1, PTCA of dLAD.  BMS PCI pCx [facility] BMS 3.5 mm x 11 mm) - unable to reopen dCx-OM (probable distal Atheroembolic occlusion.  Normal LVEF.   COLONOSCOPY     CORONARY ARTERY BYPASS GRAFT  2002   Unsure of details [facility], in [facility])   [place]  2001   ([place]) [facility] BMS PCI pLAD (3.0 mm x 15 mm) -PTCA of jailed D1 as well as distal LAD; pCx ([facility] BMS 3.5 mm x 11 mm) -unable to open distalCx-OM -thromboembolic occlusion   DIAGNOSTIC MAMMOGRAM     LEFT HEART CATH AND CORS/GRAFTS ANGIOGRAPHY N/A 04/25/2019   Procedure: LEFT HEART CATH AND CORS/GRAFTS ANGIOGRAPHY;  Surgeon: Troy Sine, MD;  Location: [facility] INVASIVE CV LAB;;; ost-prox LCx stent @ OM1 ostium =100% CTO, Ost-prox LAD stent ~30% ISR, Ost D1 CTO, prox RCA ~30%. SCG-OM2 patent with ostial stent 30%, LIMA-LAD patent; CTO of SVG-D1.    left knee replacement     MASTECTOMY     NM MYOVIEW LTD  12/2014; 2018   Cardiologist (Dr. Kellie Simmering.  [place], [place] ph# [phone]) -->LEXISCAN: Nonischemic, "normal "   pap smear     tonsilectomy     TRANSTHORACIC ECHOCARDIOGRAM  12/2014   Echocardiogram 12/25/14- normal LVEF and systolic function, EF 93-79%, mild grade 1 diastolic dysfunction   TRANSTHORACIC ECHOCARDIOGRAM  04/24/2019   EF 65 to 70%.  Moderate septal LVH.  GR 1 DD-elevated EDP.  Normal PAP.  Relatively normal valves.  No aortic stenosis.      reports that she has never smoked. She has never used smokeless tobacco. She reports that she does not drink alcohol and does not use drugs. Social History   Socioeconomic History   Marital status: Widowed    Spouse name: Not on file   Number of children: Not on file   Years of education: Not on file   Highest education level: Not on file  Occupational History   Not on file  Tobacco Use   Smoking status: Never   Smokeless tobacco: Never  Vaping Use   Vaping Use: Never used  Substance and Sexual Activity   Alcohol  use: No   Drug use: No   Sexual activity: Not Currently  Other Topics Concern   Not on file  Social History Narrative   Recently moved back to [place] (lives alone in a [place] apartment at [facility])   Do you drink/eat things with caffeine? 2 cups of coffee every day   What year  were you married? [place] - twice widowed   No pets.      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 Has POA      Johnny Bridge (Daughter) [phone])      Primary Emergency Contact: Guerrera,JOHN L   Address: [place],  [place] [address]   Home Phone: [phone]   Social Determinants of Health   Financial Resource Strain: Low Risk    Difficulty of Paying Living Expenses: Not hard at all  Food Insecurity: No Food Insecurity   Worried About [place] in the Last Year: Never true   [place] in the Last Year: Never true  Transportation Needs: No Transportation Needs   Lack of Transportation (Medical): No   Lack of Transportation (Non-Medical): No  Physical Activity: Sufficiently Active   Days of Exercise per Week: 5 days   Minutes of Exercise per Session: 30 min  Stress: No Stress Concern Present   Feeling of Stress : Only a little  Social Connections: Moderately Isolated   Frequency of Communication with Friends and Family: More than three times a week   Frequency of Social Gatherings with Friends and Family: More than three times a week   Attends Religious Services: More than 4 times per year   Active Member of [employer] or Organizations: No   Attends Archivist Meetings: Never   Marital Status: Widowed  Human resources officer Violence: Not At Risk   Fear of Current or Ex-Partner: No   Emotionally Abused: No   Physically Abused: No   Sexually Abused: No   Functional Status Survey:    Allergies  Allergen Reactions   Codeine Nausea Only     Pertinent  Health Maintenance Due  Topic Date Due   HEMOGLOBIN A1C  12/01/2019   OPHTHALMOLOGY EXAM  05/30/2021   FOOT EXAM  10/17/2021   INFLUENZA VACCINE  Completed   DEXA SCAN  Completed    Medications: Outpatient Encounter Medications as of 02/24/2021  Medication Sig   traMADol (ULTRAM) 50 MG tablet Take 1 tablet (50 mg total) by mouth every 6 (six) hours as needed.   [DISCONTINUED] acetaminophen (TYLENOL) 500 MG tablet Take 1,000 mg by mouth 2 (two) times daily.   [DISCONTINUED] amiodarone (PACERONE) 200 MG tablet Take 0.5 tablets (100 mg total) by mouth daily.   [DISCONTINUED] ammonium lactate (AMLACTIN) 12 % cream Apply topically as needed for dry skin.   [DISCONTINUED] apixaban (ELIQUIS) 5 MG TABS tablet Take 1 tablet (5 mg total) by mouth 2 (two) times daily.   [DISCONTINUED] atorvastatin (LIPITOR) 80 MG tablet Take 1 tablet (80 mg total) by mouth daily.   [DISCONTINUED] Cholecalciferol (VITAMIN D3) 50 MCG (2000 UT) TABS Take 2,000 Units by mouth daily after breakfast.   [DISCONTINUED] docusate sodium (COLACE) 100 MG capsule Take 100 mg by mouth daily.   [DISCONTINUED] enalapril (VASOTEC) 10 MG tablet Take 1 tablet (10 mg total) by mouth daily.   [DISCONTINUED] fexofenadine (ALLEGRA) 180 MG tablet Take 180 mg by mouth daily as needed for allergies or rhinitis.    [DISCONTINUED] fluticasone (FLONASE) 50 MCG/ACT nasal spray Place 1 spray into both nostrils daily as needed for allergies or rhinitis.    [DISCONTINUED] metoprolol succinate (TOPROL-XL) 50 MG 24 hr tablet Take 1 tablet (50 mg total) by mouth daily. Take with  or immediately following a meal.   [DISCONTINUED] nitroGLYCERIN (NITROSTAT) 0.4 MG SL tablet PLACE 1 TABLET UNDER TONGUE EVERY 5 MINUTES FOR 3 DOSES FOR CHEST PAIN   [DISCONTINUED] omeprazole (PRILOSEC OTC) 20 MG tablet Take 20 mg by mouth daily as needed.   [DISCONTINUED] polyethylene glycol (MIRALAX / GLYCOLAX) 17 g packet Take 17 g by mouth daily. Give with 4  oz of water.   [DISCONTINUED] zinc oxide 20 % ointment Apply 1 application topically 3 (three) times daily as needed for irritation. Apply to buttocks after every incontinent episode and as needed for redness.   acetaminophen (TYLENOL) 500 MG tablet Take 2 tablets (1,000 mg total) by mouth 2 (two) times daily.   amiodarone (PACERONE) 200 MG tablet Take 0.5 tablets (100 mg total) by mouth daily.   ammonium lactate (AMLACTIN) 12 % cream Apply topically as needed for dry skin.   apixaban (ELIQUIS) 5 MG TABS tablet Take 1 tablet (5 mg total) by mouth 2 (two) times daily.   atorvastatin (LIPITOR) 80 MG tablet Take 1 tablet (80 mg total) by mouth daily.   Cholecalciferol (VITAMIN D3) 50 MCG (2000 UT) TABS Take 2,000 Units by mouth daily after breakfast.   docusate sodium (COLACE) 100 MG capsule Take 1 capsule (100 mg total) by mouth daily.   enalapril (VASOTEC) 10 MG tablet Take 1 tablet (10 mg total) by mouth daily.   fexofenadine (ALLEGRA) 180 MG tablet Take 1 tablet (180 mg total) by mouth daily as needed for allergies or rhinitis.   fluticasone (FLONASE) 50 MCG/ACT nasal spray Place 1 spray into both nostrils daily as needed for allergies or rhinitis.   metoprolol succinate (TOPROL-XL) 50 MG 24 hr tablet Take 1 tablet (50 mg total) by mouth daily. Take with or immediately following a meal.   nitroGLYCERIN (NITROSTAT) 0.4 MG SL tablet 0.4mg  SL for chest pain, may take up to 3 doses with 5 min apart   omeprazole (PRILOSEC OTC) 20 MG tablet Take 1 tablet (20 mg total) by mouth daily as needed.   polyethylene glycol (MIRALAX / GLYCOLAX) 17 g packet Take 17 g by mouth daily. Give with 4 oz of water.   zinc oxide 20 % ointment Apply 1 application topically 3 (three) times daily as needed for irritation. Apply to buttocks after every incontinent episode and as needed for redness.   No facility-administered encounter medications on file as of 02/24/2021.    Review of Systems  Constitutional:  Negative for  activity change, appetite change and fever.  HENT:  Negative for congestion, hearing loss and voice change.   Respiratory:  Negative for cough, chest tightness, shortness of breath and wheezing.   Cardiovascular:  Negative for leg swelling.  Gastrointestinal:  Negative for abdominal pain and constipation.  Genitourinary:  Negative for dysuria and urgency.  Musculoskeletal:  Positive for arthralgias and gait problem.       S/p L TKR,  left hip pain  f/u Ortho, injx2. Improved acute pain in the right knee since fall 11/22/20 medial aspect, with weight bearing.   Skin:        Left fore arm scabbed over area from skin cancer removal.    Neurological:  Negative for speech difficulty and headaches.       Memory lapses.   Hematological:  Does not bruise/bleed easily.  Psychiatric/Behavioral:  Negative for agitation, behavioral problems and sleep disturbance.    Vitals:   02/24/21 1010  BP: 127/67  Pulse: 65  Resp: 18  Temp: 97.6  F (36.4 C)  SpO2: 96%  Weight: 182 lb 11.2 oz (82.9 kg)  Height: 5\' 7"  (1.702 m)   Body mass index is 28.61 kg/m. Physical Exam Constitutional:      Appearance: Normal appearance.  HENT:     Head: Normocephalic and atraumatic.     Nose: Nose normal.     Mouth/Throat:     Mouth: Mucous membranes are moist.  Eyes:     Extraocular Movements: Extraocular movements intact.     Pupils: Pupils are equal, round, and reactive to light.  Cardiovascular:     Rate and Rhythm: Normal rate and regular rhythm.     Heart sounds: Murmur heard.     Comments: Previous examination:  Weak DP pulse L, no felt DP pulse R, dependent rubor, cool to touch R+L feet.  Pulmonary:     Effort: Pulmonary effort is normal.     Breath sounds: Normal breath sounds. No rales.  Abdominal:     General: Bowel sounds are normal.     Palpations: Abdomen is soft.  Musculoskeletal:     Cervical back: Normal range of motion and neck supple.     Right lower leg: No edema.     Left lower  leg: No edema.     Comments: Left hip/groin pain when getting up from sitting. Ambulates with walker, no c/o R knee pain.   Skin:    General: Skin is warm and dry.     Comments: BLE cool to touch, dependent rubor noted. Mild venous insufficiency skin changes above ankles. Left fore arm scabbed over area from skin cancer removal.    Neurological:     General: No focal deficit present.     Mental Status: She is alert and oriented to person, place, and time.     Motor: No weakness.     Coordination: Coordination normal.     Gait: Gait abnormal.  Psychiatric:        Mood and Affect: Mood normal.        Behavior: Behavior normal.        Thought Content: Thought content normal.        Judgment: Judgment normal.    Labs reviewed: Basic Metabolic Panel: Recent Labs    04/05/20 0000 12/02/20 0000  NA 142 142  K 4.5 4.7  CL 101 106  CO2 24 28*  GLUCOSE 101*  --   BUN 15 20  CREATININE 1.06* 0.9  CALCIUM 10.0 9.4   Liver Function Tests: Recent Labs    04/05/20 0000 12/02/20 0000  AST 31 17  ALT 40* 15  ALKPHOS 54 43  BILITOT 1.0  --   PROT 7.5  --   ALBUMIN 4.4 3.6   No results for input(s): LIPASE, AMYLASE in the last 8760 hours. No results for input(s): AMMONIA in the last 8760 hours. CBC: Recent Labs    04/05/20 0000 12/02/20 0000  WBC 5.4 6.0  NEUTROABS  --  10.70  HGB 13.3 12.4  HCT 39.9 37  MCV 98*  --   PLT 215 248   Cardiac Enzymes: No results for input(s): CKTOTAL, CKMB, CKMBINDEX, TROPONINI in the last 8760 hours. BNP: Invalid input(s): POCBNP CBG: No results for input(s): GLUCAP in the last 8760 hours.  Procedures and Imaging Studies During Stay: No results found.  Assessment/Plan:   Osteoarthritis of multiple joints The right knee pain with weight bearing sustained from a fall on the bus 11/22/20. Saw Ortho Dr. Rhona Raider 11/22/20, negative X-ray, better  now.  MRI 11/13/20 her back showed severer spinal and foraminal stenosis with left  sciatica left knee s/p TKR, left hip pain, failed inj x2,  MR lumbar, severe spinal 11/13/20 spinal and foraminal stenosis.   On Tylenol, prn Tramadol.  Paroxysmal atrial fibrillation/flutter (HCC) Heart rate is in control, takes Metoprolol, Eliquis, Amiodarone, f/u cardiology. TSH 1.51 09/05/20  Essential hypertension Blood pressure is controlled, takes Enalapril, Metoprolol.   Gastroesophageal reflux disease stable, takes Omeprazole, Hgb 12.4 12/02/20  Coronary artery disease involving coronary bypass graft of native heart with angina pectoris (HCC) stable, Hx of CABG Cath 04/25/19, prn NTG, takes Atorvastatin. LDL 74 04/05/20  CKD (chronic kidney disease) stage 3, GFR 30-59 ml/min (HCC) Bun/creat 20//0.9 12/02/20  OSA on CPAP on CPAP  Mild memory disturbance  MMSE 27/30  Peripheral edema BLE, mild, observe, Hx of prn Furosemide use.      Patient is being discharged with the following home health services:    Patient is being discharged with the following durable medical equipment:    Patient has been advised to f/u with their PCP in 1-2 weeks to for a transitions of care visit.  Social services at their facility was responsible for arranging this appointment.  Pt was provided with adequate prescriptions of noncontrolled medications to reach the scheduled appointment .  For controlled substances, a limited supply was provided as appropriate for the individual patient.  If the pt normally receives these medications from a pain clinic or has a contract with another physician, these medications should be received from that clinic or physician only).    Future labs/tests needed:  prn

## 2021-02-24 NOTE — Assessment & Plan Note (Signed)
Bun/creat 20//0.9 12/02/20

## 2021-02-24 NOTE — Assessment & Plan Note (Signed)
stable, Hx of CABG Cath 04/25/19, prn NTG, takes Atorvastatin. LDL 74 04/05/20

## 2021-02-25 ENCOUNTER — Encounter: Payer: Self-pay | Admitting: Nurse Practitioner

## 2021-02-25 DIAGNOSIS — R278 Other lack of coordination: Secondary | ICD-10-CM | POA: Diagnosis not present

## 2021-02-25 DIAGNOSIS — M1990 Unspecified osteoarthritis, unspecified site: Secondary | ICD-10-CM | POA: Diagnosis not present

## 2021-02-25 DIAGNOSIS — R2689 Other abnormalities of gait and mobility: Secondary | ICD-10-CM | POA: Diagnosis not present

## 2021-02-25 DIAGNOSIS — M6281 Muscle weakness (generalized): Secondary | ICD-10-CM | POA: Diagnosis not present

## 2021-02-25 DIAGNOSIS — Z9181 History of falling: Secondary | ICD-10-CM | POA: Diagnosis not present

## 2021-02-26 DIAGNOSIS — M1990 Unspecified osteoarthritis, unspecified site: Secondary | ICD-10-CM | POA: Diagnosis not present

## 2021-02-26 DIAGNOSIS — R278 Other lack of coordination: Secondary | ICD-10-CM | POA: Diagnosis not present

## 2021-02-26 DIAGNOSIS — R2689 Other abnormalities of gait and mobility: Secondary | ICD-10-CM | POA: Diagnosis not present

## 2021-02-26 DIAGNOSIS — Z9181 History of falling: Secondary | ICD-10-CM | POA: Diagnosis not present

## 2021-02-26 DIAGNOSIS — M6281 Muscle weakness (generalized): Secondary | ICD-10-CM | POA: Diagnosis not present

## 2021-02-27 DIAGNOSIS — R2681 Unsteadiness on feet: Secondary | ICD-10-CM | POA: Diagnosis not present

## 2021-02-27 DIAGNOSIS — R531 Weakness: Secondary | ICD-10-CM | POA: Diagnosis not present

## 2021-02-27 DIAGNOSIS — R41841 Cognitive communication deficit: Secondary | ICD-10-CM | POA: Diagnosis not present

## 2021-02-27 DIAGNOSIS — R29898 Other symptoms and signs involving the musculoskeletal system: Secondary | ICD-10-CM | POA: Diagnosis not present

## 2021-02-27 DIAGNOSIS — M6281 Muscle weakness (generalized): Secondary | ICD-10-CM | POA: Diagnosis not present

## 2021-02-27 DIAGNOSIS — I129 Hypertensive chronic kidney disease with stage 1 through stage 4 chronic kidney disease, or unspecified chronic kidney disease: Secondary | ICD-10-CM | POA: Diagnosis not present

## 2021-02-27 DIAGNOSIS — Z9181 History of falling: Secondary | ICD-10-CM | POA: Diagnosis not present

## 2021-02-28 DIAGNOSIS — M6281 Muscle weakness (generalized): Secondary | ICD-10-CM | POA: Diagnosis not present

## 2021-02-28 DIAGNOSIS — Z9181 History of falling: Secondary | ICD-10-CM | POA: Diagnosis not present

## 2021-02-28 DIAGNOSIS — I129 Hypertensive chronic kidney disease with stage 1 through stage 4 chronic kidney disease, or unspecified chronic kidney disease: Secondary | ICD-10-CM | POA: Diagnosis not present

## 2021-02-28 DIAGNOSIS — R29898 Other symptoms and signs involving the musculoskeletal system: Secondary | ICD-10-CM | POA: Diagnosis not present

## 2021-02-28 DIAGNOSIS — R2681 Unsteadiness on feet: Secondary | ICD-10-CM | POA: Diagnosis not present

## 2021-02-28 DIAGNOSIS — R41841 Cognitive communication deficit: Secondary | ICD-10-CM | POA: Diagnosis not present

## 2021-03-02 DIAGNOSIS — Z9181 History of falling: Secondary | ICD-10-CM | POA: Diagnosis not present

## 2021-03-02 DIAGNOSIS — M6281 Muscle weakness (generalized): Secondary | ICD-10-CM | POA: Diagnosis not present

## 2021-03-02 DIAGNOSIS — I129 Hypertensive chronic kidney disease with stage 1 through stage 4 chronic kidney disease, or unspecified chronic kidney disease: Secondary | ICD-10-CM | POA: Diagnosis not present

## 2021-03-02 DIAGNOSIS — R29898 Other symptoms and signs involving the musculoskeletal system: Secondary | ICD-10-CM | POA: Diagnosis not present

## 2021-03-02 DIAGNOSIS — R2681 Unsteadiness on feet: Secondary | ICD-10-CM | POA: Diagnosis not present

## 2021-03-02 DIAGNOSIS — R41841 Cognitive communication deficit: Secondary | ICD-10-CM | POA: Diagnosis not present

## 2021-03-04 DIAGNOSIS — Z9181 History of falling: Secondary | ICD-10-CM | POA: Diagnosis not present

## 2021-03-04 DIAGNOSIS — R41841 Cognitive communication deficit: Secondary | ICD-10-CM | POA: Diagnosis not present

## 2021-03-04 DIAGNOSIS — R2681 Unsteadiness on feet: Secondary | ICD-10-CM | POA: Diagnosis not present

## 2021-03-04 DIAGNOSIS — M6281 Muscle weakness (generalized): Secondary | ICD-10-CM | POA: Diagnosis not present

## 2021-03-04 DIAGNOSIS — R29898 Other symptoms and signs involving the musculoskeletal system: Secondary | ICD-10-CM | POA: Diagnosis not present

## 2021-03-04 DIAGNOSIS — I129 Hypertensive chronic kidney disease with stage 1 through stage 4 chronic kidney disease, or unspecified chronic kidney disease: Secondary | ICD-10-CM | POA: Diagnosis not present

## 2021-03-05 DIAGNOSIS — R29898 Other symptoms and signs involving the musculoskeletal system: Secondary | ICD-10-CM | POA: Diagnosis not present

## 2021-03-05 DIAGNOSIS — M6281 Muscle weakness (generalized): Secondary | ICD-10-CM | POA: Diagnosis not present

## 2021-03-05 DIAGNOSIS — I129 Hypertensive chronic kidney disease with stage 1 through stage 4 chronic kidney disease, or unspecified chronic kidney disease: Secondary | ICD-10-CM | POA: Diagnosis not present

## 2021-03-05 DIAGNOSIS — R41841 Cognitive communication deficit: Secondary | ICD-10-CM | POA: Diagnosis not present

## 2021-03-05 DIAGNOSIS — Z9181 History of falling: Secondary | ICD-10-CM | POA: Diagnosis not present

## 2021-03-05 DIAGNOSIS — R2681 Unsteadiness on feet: Secondary | ICD-10-CM | POA: Diagnosis not present

## 2021-03-06 DIAGNOSIS — M6281 Muscle weakness (generalized): Secondary | ICD-10-CM | POA: Diagnosis not present

## 2021-03-06 DIAGNOSIS — Z9181 History of falling: Secondary | ICD-10-CM | POA: Diagnosis not present

## 2021-03-06 DIAGNOSIS — R29898 Other symptoms and signs involving the musculoskeletal system: Secondary | ICD-10-CM | POA: Diagnosis not present

## 2021-03-06 DIAGNOSIS — R41841 Cognitive communication deficit: Secondary | ICD-10-CM | POA: Diagnosis not present

## 2021-03-06 DIAGNOSIS — R2681 Unsteadiness on feet: Secondary | ICD-10-CM | POA: Diagnosis not present

## 2021-03-06 DIAGNOSIS — I129 Hypertensive chronic kidney disease with stage 1 through stage 4 chronic kidney disease, or unspecified chronic kidney disease: Secondary | ICD-10-CM | POA: Diagnosis not present

## 2021-03-07 DIAGNOSIS — R2681 Unsteadiness on feet: Secondary | ICD-10-CM | POA: Diagnosis not present

## 2021-03-07 DIAGNOSIS — I129 Hypertensive chronic kidney disease with stage 1 through stage 4 chronic kidney disease, or unspecified chronic kidney disease: Secondary | ICD-10-CM | POA: Diagnosis not present

## 2021-03-07 DIAGNOSIS — Z9181 History of falling: Secondary | ICD-10-CM | POA: Diagnosis not present

## 2021-03-07 DIAGNOSIS — R41841 Cognitive communication deficit: Secondary | ICD-10-CM | POA: Diagnosis not present

## 2021-03-07 DIAGNOSIS — M6281 Muscle weakness (generalized): Secondary | ICD-10-CM | POA: Diagnosis not present

## 2021-03-07 DIAGNOSIS — R29898 Other symptoms and signs involving the musculoskeletal system: Secondary | ICD-10-CM | POA: Diagnosis not present

## 2021-03-08 DIAGNOSIS — Z9181 History of falling: Secondary | ICD-10-CM | POA: Diagnosis not present

## 2021-03-08 DIAGNOSIS — R29898 Other symptoms and signs involving the musculoskeletal system: Secondary | ICD-10-CM | POA: Diagnosis not present

## 2021-03-08 DIAGNOSIS — M6281 Muscle weakness (generalized): Secondary | ICD-10-CM | POA: Diagnosis not present

## 2021-03-08 DIAGNOSIS — R2681 Unsteadiness on feet: Secondary | ICD-10-CM | POA: Diagnosis not present

## 2021-03-08 DIAGNOSIS — I129 Hypertensive chronic kidney disease with stage 1 through stage 4 chronic kidney disease, or unspecified chronic kidney disease: Secondary | ICD-10-CM | POA: Diagnosis not present

## 2021-03-08 DIAGNOSIS — R41841 Cognitive communication deficit: Secondary | ICD-10-CM | POA: Diagnosis not present

## 2021-03-10 DIAGNOSIS — Z9181 History of falling: Secondary | ICD-10-CM | POA: Diagnosis not present

## 2021-03-10 DIAGNOSIS — R2681 Unsteadiness on feet: Secondary | ICD-10-CM | POA: Diagnosis not present

## 2021-03-10 DIAGNOSIS — R29898 Other symptoms and signs involving the musculoskeletal system: Secondary | ICD-10-CM | POA: Diagnosis not present

## 2021-03-10 DIAGNOSIS — I129 Hypertensive chronic kidney disease with stage 1 through stage 4 chronic kidney disease, or unspecified chronic kidney disease: Secondary | ICD-10-CM | POA: Diagnosis not present

## 2021-03-10 DIAGNOSIS — R41841 Cognitive communication deficit: Secondary | ICD-10-CM | POA: Diagnosis not present

## 2021-03-10 DIAGNOSIS — M6281 Muscle weakness (generalized): Secondary | ICD-10-CM | POA: Diagnosis not present

## 2021-03-11 DIAGNOSIS — R29898 Other symptoms and signs involving the musculoskeletal system: Secondary | ICD-10-CM | POA: Diagnosis not present

## 2021-03-11 DIAGNOSIS — R41841 Cognitive communication deficit: Secondary | ICD-10-CM | POA: Diagnosis not present

## 2021-03-11 DIAGNOSIS — Z9181 History of falling: Secondary | ICD-10-CM | POA: Diagnosis not present

## 2021-03-11 DIAGNOSIS — R2681 Unsteadiness on feet: Secondary | ICD-10-CM | POA: Diagnosis not present

## 2021-03-11 DIAGNOSIS — M6281 Muscle weakness (generalized): Secondary | ICD-10-CM | POA: Diagnosis not present

## 2021-03-11 DIAGNOSIS — I129 Hypertensive chronic kidney disease with stage 1 through stage 4 chronic kidney disease, or unspecified chronic kidney disease: Secondary | ICD-10-CM | POA: Diagnosis not present

## 2021-03-12 DIAGNOSIS — R2681 Unsteadiness on feet: Secondary | ICD-10-CM | POA: Diagnosis not present

## 2021-03-12 DIAGNOSIS — R29898 Other symptoms and signs involving the musculoskeletal system: Secondary | ICD-10-CM | POA: Diagnosis not present

## 2021-03-12 DIAGNOSIS — I129 Hypertensive chronic kidney disease with stage 1 through stage 4 chronic kidney disease, or unspecified chronic kidney disease: Secondary | ICD-10-CM | POA: Diagnosis not present

## 2021-03-12 DIAGNOSIS — R41841 Cognitive communication deficit: Secondary | ICD-10-CM | POA: Diagnosis not present

## 2021-03-12 DIAGNOSIS — Z9181 History of falling: Secondary | ICD-10-CM | POA: Diagnosis not present

## 2021-03-12 DIAGNOSIS — M6281 Muscle weakness (generalized): Secondary | ICD-10-CM | POA: Diagnosis not present

## 2021-03-12 DIAGNOSIS — R531 Weakness: Secondary | ICD-10-CM | POA: Diagnosis not present

## 2021-03-13 DIAGNOSIS — M6281 Muscle weakness (generalized): Secondary | ICD-10-CM | POA: Diagnosis not present

## 2021-03-13 DIAGNOSIS — Z9181 History of falling: Secondary | ICD-10-CM | POA: Diagnosis not present

## 2021-03-13 DIAGNOSIS — R41841 Cognitive communication deficit: Secondary | ICD-10-CM | POA: Diagnosis not present

## 2021-03-13 DIAGNOSIS — R29898 Other symptoms and signs involving the musculoskeletal system: Secondary | ICD-10-CM | POA: Diagnosis not present

## 2021-03-13 DIAGNOSIS — R2681 Unsteadiness on feet: Secondary | ICD-10-CM | POA: Diagnosis not present

## 2021-03-13 DIAGNOSIS — I129 Hypertensive chronic kidney disease with stage 1 through stage 4 chronic kidney disease, or unspecified chronic kidney disease: Secondary | ICD-10-CM | POA: Diagnosis not present

## 2021-03-14 DIAGNOSIS — Z9181 History of falling: Secondary | ICD-10-CM | POA: Diagnosis not present

## 2021-03-14 DIAGNOSIS — R29898 Other symptoms and signs involving the musculoskeletal system: Secondary | ICD-10-CM | POA: Diagnosis not present

## 2021-03-14 DIAGNOSIS — R41841 Cognitive communication deficit: Secondary | ICD-10-CM | POA: Diagnosis not present

## 2021-03-14 DIAGNOSIS — M6281 Muscle weakness (generalized): Secondary | ICD-10-CM | POA: Diagnosis not present

## 2021-03-14 DIAGNOSIS — R2681 Unsteadiness on feet: Secondary | ICD-10-CM | POA: Diagnosis not present

## 2021-03-14 DIAGNOSIS — I129 Hypertensive chronic kidney disease with stage 1 through stage 4 chronic kidney disease, or unspecified chronic kidney disease: Secondary | ICD-10-CM | POA: Diagnosis not present

## 2021-03-17 DIAGNOSIS — M6281 Muscle weakness (generalized): Secondary | ICD-10-CM | POA: Diagnosis not present

## 2021-03-17 DIAGNOSIS — R29898 Other symptoms and signs involving the musculoskeletal system: Secondary | ICD-10-CM | POA: Diagnosis not present

## 2021-03-17 DIAGNOSIS — Z9181 History of falling: Secondary | ICD-10-CM | POA: Diagnosis not present

## 2021-03-17 DIAGNOSIS — R2681 Unsteadiness on feet: Secondary | ICD-10-CM | POA: Diagnosis not present

## 2021-03-17 DIAGNOSIS — R41841 Cognitive communication deficit: Secondary | ICD-10-CM | POA: Diagnosis not present

## 2021-03-17 DIAGNOSIS — I129 Hypertensive chronic kidney disease with stage 1 through stage 4 chronic kidney disease, or unspecified chronic kidney disease: Secondary | ICD-10-CM | POA: Diagnosis not present

## 2021-03-18 DIAGNOSIS — M6281 Muscle weakness (generalized): Secondary | ICD-10-CM | POA: Diagnosis not present

## 2021-03-18 DIAGNOSIS — R2681 Unsteadiness on feet: Secondary | ICD-10-CM | POA: Diagnosis not present

## 2021-03-18 DIAGNOSIS — R29898 Other symptoms and signs involving the musculoskeletal system: Secondary | ICD-10-CM | POA: Diagnosis not present

## 2021-03-18 DIAGNOSIS — I129 Hypertensive chronic kidney disease with stage 1 through stage 4 chronic kidney disease, or unspecified chronic kidney disease: Secondary | ICD-10-CM | POA: Diagnosis not present

## 2021-03-18 DIAGNOSIS — Z9181 History of falling: Secondary | ICD-10-CM | POA: Diagnosis not present

## 2021-03-18 DIAGNOSIS — R41841 Cognitive communication deficit: Secondary | ICD-10-CM | POA: Diagnosis not present

## 2021-03-19 DIAGNOSIS — M6281 Muscle weakness (generalized): Secondary | ICD-10-CM | POA: Diagnosis not present

## 2021-03-19 DIAGNOSIS — I129 Hypertensive chronic kidney disease with stage 1 through stage 4 chronic kidney disease, or unspecified chronic kidney disease: Secondary | ICD-10-CM | POA: Diagnosis not present

## 2021-03-19 DIAGNOSIS — R41841 Cognitive communication deficit: Secondary | ICD-10-CM | POA: Diagnosis not present

## 2021-03-19 DIAGNOSIS — R29898 Other symptoms and signs involving the musculoskeletal system: Secondary | ICD-10-CM | POA: Diagnosis not present

## 2021-03-19 DIAGNOSIS — Z9181 History of falling: Secondary | ICD-10-CM | POA: Diagnosis not present

## 2021-03-19 DIAGNOSIS — R2681 Unsteadiness on feet: Secondary | ICD-10-CM | POA: Diagnosis not present

## 2021-03-20 DIAGNOSIS — I129 Hypertensive chronic kidney disease with stage 1 through stage 4 chronic kidney disease, or unspecified chronic kidney disease: Secondary | ICD-10-CM | POA: Diagnosis not present

## 2021-03-20 DIAGNOSIS — R2681 Unsteadiness on feet: Secondary | ICD-10-CM | POA: Diagnosis not present

## 2021-03-20 DIAGNOSIS — Z9181 History of falling: Secondary | ICD-10-CM | POA: Diagnosis not present

## 2021-03-20 DIAGNOSIS — M6281 Muscle weakness (generalized): Secondary | ICD-10-CM | POA: Diagnosis not present

## 2021-03-20 DIAGNOSIS — R41841 Cognitive communication deficit: Secondary | ICD-10-CM | POA: Diagnosis not present

## 2021-03-20 DIAGNOSIS — R29898 Other symptoms and signs involving the musculoskeletal system: Secondary | ICD-10-CM | POA: Diagnosis not present

## 2021-03-21 DIAGNOSIS — R2681 Unsteadiness on feet: Secondary | ICD-10-CM | POA: Diagnosis not present

## 2021-03-21 DIAGNOSIS — R41841 Cognitive communication deficit: Secondary | ICD-10-CM | POA: Diagnosis not present

## 2021-03-21 DIAGNOSIS — M6281 Muscle weakness (generalized): Secondary | ICD-10-CM | POA: Diagnosis not present

## 2021-03-21 DIAGNOSIS — I129 Hypertensive chronic kidney disease with stage 1 through stage 4 chronic kidney disease, or unspecified chronic kidney disease: Secondary | ICD-10-CM | POA: Diagnosis not present

## 2021-03-21 DIAGNOSIS — Z9181 History of falling: Secondary | ICD-10-CM | POA: Diagnosis not present

## 2021-03-21 DIAGNOSIS — R29898 Other symptoms and signs involving the musculoskeletal system: Secondary | ICD-10-CM | POA: Diagnosis not present

## 2021-03-24 DIAGNOSIS — R41841 Cognitive communication deficit: Secondary | ICD-10-CM | POA: Diagnosis not present

## 2021-03-24 DIAGNOSIS — R29898 Other symptoms and signs involving the musculoskeletal system: Secondary | ICD-10-CM | POA: Diagnosis not present

## 2021-03-24 DIAGNOSIS — Z9181 History of falling: Secondary | ICD-10-CM | POA: Diagnosis not present

## 2021-03-24 DIAGNOSIS — R2681 Unsteadiness on feet: Secondary | ICD-10-CM | POA: Diagnosis not present

## 2021-03-24 DIAGNOSIS — M6281 Muscle weakness (generalized): Secondary | ICD-10-CM | POA: Diagnosis not present

## 2021-03-24 DIAGNOSIS — I129 Hypertensive chronic kidney disease with stage 1 through stage 4 chronic kidney disease, or unspecified chronic kidney disease: Secondary | ICD-10-CM | POA: Diagnosis not present

## 2021-03-25 ENCOUNTER — Encounter: Payer: Self-pay | Admitting: Internal Medicine

## 2021-03-25 ENCOUNTER — Non-Acute Institutional Stay: Payer: Medicare Other | Admitting: Internal Medicine

## 2021-03-25 DIAGNOSIS — I10 Essential (primary) hypertension: Secondary | ICD-10-CM | POA: Diagnosis not present

## 2021-03-25 DIAGNOSIS — M6281 Muscle weakness (generalized): Secondary | ICD-10-CM | POA: Diagnosis not present

## 2021-03-25 DIAGNOSIS — I480 Paroxysmal atrial fibrillation: Secondary | ICD-10-CM

## 2021-03-25 DIAGNOSIS — M25552 Pain in left hip: Secondary | ICD-10-CM

## 2021-03-25 DIAGNOSIS — R262 Difficulty in walking, not elsewhere classified: Secondary | ICD-10-CM | POA: Diagnosis not present

## 2021-03-25 DIAGNOSIS — Z9989 Dependence on other enabling machines and devices: Secondary | ICD-10-CM

## 2021-03-25 DIAGNOSIS — G4733 Obstructive sleep apnea (adult) (pediatric): Secondary | ICD-10-CM

## 2021-03-25 DIAGNOSIS — M1612 Unilateral primary osteoarthritis, left hip: Secondary | ICD-10-CM | POA: Diagnosis not present

## 2021-03-25 DIAGNOSIS — I129 Hypertensive chronic kidney disease with stage 1 through stage 4 chronic kidney disease, or unspecified chronic kidney disease: Secondary | ICD-10-CM | POA: Diagnosis not present

## 2021-03-25 DIAGNOSIS — R29898 Other symptoms and signs involving the musculoskeletal system: Secondary | ICD-10-CM | POA: Diagnosis not present

## 2021-03-25 DIAGNOSIS — R41841 Cognitive communication deficit: Secondary | ICD-10-CM | POA: Diagnosis not present

## 2021-03-25 DIAGNOSIS — I25709 Atherosclerosis of coronary artery bypass graft(s), unspecified, with unspecified angina pectoris: Secondary | ICD-10-CM | POA: Diagnosis not present

## 2021-03-25 DIAGNOSIS — R2681 Unsteadiness on feet: Secondary | ICD-10-CM | POA: Diagnosis not present

## 2021-03-25 DIAGNOSIS — Z9181 History of falling: Secondary | ICD-10-CM | POA: Diagnosis not present

## 2021-03-25 NOTE — Progress Notes (Signed)
?Location:   [facility] ?Nursing Home Room Number: 161 ?Place of Service:  ALF (13) ?Provider:  Veleta Miners MD ? ?Mast, [name], NP ? ?Patient Care Team: ?Mast, [name], NP as PCP - General (Internal Medicine) ?Leonie Man, MD as PCP - Cardiology (Cardiology) ?Mast, [name], NP as Nurse Practitioner (Internal Medicine) ?Lavonna Monarch, MD as Consulting Physician (Dermatology) ? ?Extended Emergency Contact Information ?Primary Emergency Contact: Johnson,Carrie ?Mobile Phone: [phone] ?Relation: Daughter ?Secondary Emergency Contact: Weeks,Tiffany ?Mobile Phone: [phone] ?Relation: Daughter ?Preferred language: English ?Interpreter needed? No ? ?Code Status:  Full Code ?Goals of care: Advanced Directive information ?Advanced Directives 03/25/2021  ?Does Patient Have a Medical Advance Directive? Yes  ?Type of Paramedic of [place];Out of facility DNR (pink MOST or yellow form);Living will  ?Does patient want to make changes to medical advance directive? No - Patient declined  ?Copy of [place] in Chart? Yes - validated most recent copy scanned in chart (See row information)  ?Pre-existing out of facility DNR order (yellow form or pink MOST form) Yellow form placed in chart (order not valid for inpatient use);Pink MOST form placed in chart (order not valid for inpatient use)  ? ? ? ?Chief Complaint  ?Patient presents with  ? Acute Visit  ?  Left hip pain  ? ? ?HPI:  ?Pt is a 88 y.o. female seen today for an acute visit for Left Hip Pain  ? ?Lives in [place] now ? ?Patient has a history of CAD s/p CABG in 2002 S/p cath in 04/21 which showed occluded SVG to Diag but patent LIMA to LAD and SVG to CX.EF. On Medical Management ?EF 65% ?PAF diagnosed in 4/21 on Eliquis ?Sleep Apnea uses CPAP ?H/I Skin Cancer ?H/o Breast Cancer s/p Mastectomy  ?HTN,HLD and Arthritis ?Cognitive impairment ?LE Edema ? ?Patient Had Right Knee pain after fall in 11/22 ?Did therapy. ?Also  had Spinal Injection in 01/23 ? ?She left hip pain on and off for the past few months. ?But since she has been in [place] the therapy is working with her and she states that her pain has gotten worse.  It was so bad yesterday that she thought she has to go to the hospital.  She she says is from her left hip to the knee. ?Per therapy she was walking with them yesterday able to put weight on that hip but then was buckling when she got tired. ?Her pain kept her up all night. ?Also having difficulty doing her transfers and walking by herself. ?Per therapy she also goes to sleep during the sessions ?Denies Back Pain ?Past Medical History:  ?Diagnosis Date  ? Atherosclerosis of native coronary artery 2001  ? LAD & Cx disease -- BMS PCI in 2001; CABG 2002.  PCI in 2006 (per-op TAH)  ? Atrial fibrillation and flutter ([place]) 04/2019  ? New diagnosis-major complaint was chest tightness and dyspnea.  ? Breast cancer ([place])   ? Cervix dysplasia   ? GERD (gastroesophageal reflux disease)   ? Heart attack ([place]) 2002, 2006  ? Both events reportedly were perioperatively. She has no recollection. Initial PCI was related to angina symptoms (bilateral arm pain)  ? High cholesterol   ? Hypertension   ? Lumbosacral spinal stenosis   ? Osteoarthritis   ? Skin cancer   ? Sleep apnea   ? Uses CPAP faithfully  ? Squamous cell carcinoma of skin 02/12/2020  ? in situ- left neck-anterior (CX35FU)  ? ?Past  Surgical History:  ?Procedure Laterality Date  ? ABDOMINAL HYSTERECTOMY    ? bone density    ? CARDIAC CATHETERIZATION  2001  ?  Dr. Glade Lloyd [facility]) 2 vessel CAD involving LAD-D1 and distal Cx-OM. Appearance of dilated tapered left main with no significant atherosclerosis on IVUS --  BMS PCI of pLAD ([facility] BMS 3.0 mm x 15 mm) & with PTCA of ostial D1, PTCA of dLAD.  BMS PCI pCx [facility] BMS 3.5 mm x 11 mm) - unable to reopen dCx-OM (probable distal Atheroembolic occlusion.  Normal LVEF.  ? COLONOSCOPY    ? CORONARY ARTERY BYPASS GRAFT  2002  ?  Unsure of details [facility])  ? CORONARY STENT INTERVENTION  2001  ? ([place]) [facility]ale BMS PCI pLAD (3.0 mm x 15 mm) -PTCA of jailed D1 as well as distal LAD; pCx ([facility] BMS 3.5 mm x 11 mm) -unable to open distalCx-OM -thromboembolic occlusion  ? DIAGNOSTIC MAMMOGRAM    ? LEFT HEART CATH AND CORS/GRAFTS ANGIOGRAPHY N/A 04/25/2019  ? Procedure: LEFT HEART CATH AND CORS/GRAFTS ANGIOGRAPHY;  Surgeon: Troy Sine, MD;  [facility];;; ost-prox LCx stent @ OM1 ostium =100% CTO, Ost-prox LAD stent ~30% ISR, Ost D1 CTO, prox RCA ~30%. SCG-OM2 patent with ostial stent 30%, LIMA-LAD patent; CTO of SVG-D1.   ? left knee replacement    ? MASTECTOMY    ? NM MYOVIEW LTD  12/2014; 2018  ? Cardiologist (Dr. Kellie Simmering.  [place], [place] ph# [phone]) -->LEXISCAN: Nonischemic, "normal "  ? pap smear    ? tonsilectomy    ? TRANSTHORACIC ECHOCARDIOGRAM  12/2014  ? Echocardiogram 12/25/14- normal LVEF and systolic function, EF 17-40%, mild grade 1 diastolic dysfunction  ? TRANSTHORACIC ECHOCARDIOGRAM  04/24/2019  ? EF 65 to 70%.  Moderate septal LVH.  GR 1 DD-elevated EDP.  Normal PAP.  Relatively normal valves.  No aortic stenosis.  ? ? ?Allergies  ?Allergen Reactions  ? Codeine Nausea Only  ? ? ?Allergies as of 03/25/2021   ? ?   Reactions  ? Codeine Nausea Only  ? ?  ? ?  ?Medication List  ?  ? ?  ? Accurate as of March 25, 2021 10:41 AM. If you have any questions, ask your nurse or doctor.  ?  ?  ? ?  ? ?acetaminophen 500 MG tablet ?Commonly known as: TYLENOL ?Take 2 tablets (1,000 mg total) by mouth 2 (two) times daily. ?  ?acetaminophen 325 MG tablet ?Commonly known as: TYLENOL ?Take 650 mg by mouth every 4 (four) hours as needed. ?  ?amiodarone 200 MG tablet ?Commonly known as: PACERONE ?Take 0.5 tablets (100 mg total) by mouth daily. ?  ?ammonium lactate 12 % cream ?Commonly known as: AMLACTIN ?Apply topically as needed for dry skin. ?  ?apixaban 5 MG Tabs tablet ?Commonly known as:  Eliquis ?Take 1 tablet (5 mg total) by mouth 2 (two) times daily. ?  ?atorvastatin 80 MG tablet ?Commonly known as: LIPITOR ?Take 1 tablet (80 mg total) by mouth daily. ?  ?docusate sodium 100 MG capsule ?Commonly known as: COLACE ?Take 1 capsule (100 mg total) by mouth daily. ?  ?enalapril 10 MG tablet ?Commonly known as: VASOTEC ?Take 1 tablet (10 mg total) by mouth daily. ?  ?fexofenadine 180 MG tablet ?Commonly known as: ALLEGRA ?Take 1 tablet (180 mg total) by mouth daily as needed for allergies or rhinitis. ?  ?fluticasone 50 MCG/ACT nasal spray ?Commonly known as: FLONASE ?  Place 1 spray into both nostrils daily as needed for allergies or rhinitis. ?  ?metoprolol succinate 50 MG 24 hr tablet ?Commonly known as: TOPROL-XL ?Take 1 tablet (50 mg total) by mouth daily. Take with or immediately following a meal. ?  ?nitroGLYCERIN 0.4 MG SL tablet ?Commonly known as: NITROSTAT ?0.'4mg'$  SL for chest pain, may take up to 3 doses with 5 min apart ?  ?omeprazole 20 MG tablet ?Commonly known as: PRILOSEC OTC ?Take 1 tablet (20 mg total) by mouth daily as needed. ?  ?polyethylene glycol 17 g packet ?Commonly known as: MIRALAX / GLYCOLAX ?Take 17 g by mouth daily. Give with 4 oz of water. ?  ?traMADol 50 MG tablet ?Commonly known as: Ultram ?Take 1 tablet (50 mg total) by mouth every 6 (six) hours as needed. ?  ?Vitamin D3 50 MCG (2000 UT) Tabs ?Take 2,000 Units by mouth daily after breakfast. ?  ?zinc oxide 20 % ointment ?Apply 1 application topically 3 (three) times daily as needed for irritation. Apply to buttocks after every incontinent episode and as needed for redness. ?  ? ?  ? ? ?Review of Systems  ?Constitutional:  Positive for activity change. Negative for appetite change.  ?HENT: Negative.    ?Respiratory:  Negative for cough and shortness of breath.   ?Cardiovascular:  Negative for leg swelling.  ?Gastrointestinal:  Negative for constipation.  ?Genitourinary: Negative.   ?Musculoskeletal:  Positive for  arthralgias, gait problem and myalgias.  ?Skin: Negative.   ?Neurological:  Positive for weakness. Negative for dizziness.  ?Psychiatric/Behavioral:  Positive for confusion. Negative for dysphoric mood and sleep disturb

## 2021-03-26 DIAGNOSIS — R2681 Unsteadiness on feet: Secondary | ICD-10-CM | POA: Diagnosis not present

## 2021-03-26 DIAGNOSIS — R41841 Cognitive communication deficit: Secondary | ICD-10-CM | POA: Diagnosis not present

## 2021-03-26 DIAGNOSIS — I129 Hypertensive chronic kidney disease with stage 1 through stage 4 chronic kidney disease, or unspecified chronic kidney disease: Secondary | ICD-10-CM | POA: Diagnosis not present

## 2021-03-26 DIAGNOSIS — R29898 Other symptoms and signs involving the musculoskeletal system: Secondary | ICD-10-CM | POA: Diagnosis not present

## 2021-03-26 DIAGNOSIS — M6281 Muscle weakness (generalized): Secondary | ICD-10-CM | POA: Diagnosis not present

## 2021-03-26 DIAGNOSIS — Z9181 History of falling: Secondary | ICD-10-CM | POA: Diagnosis not present

## 2021-03-26 LAB — CBC AND DIFFERENTIAL
HCT: 35 — AB (ref 36–46)
Hemoglobin: 11.9 — AB (ref 12.0–16.0)
Neutrophils Absolute: 3627
Platelets: 226 10*3/uL (ref 150–400)
WBC: 6.5

## 2021-03-26 LAB — BASIC METABOLIC PANEL
BUN: 21 (ref 4–21)
CO2: 27 — AB (ref 13–22)
Chloride: 106 (ref 99–108)
Creatinine: 0.9 (ref 0.5–1.1)
Glucose: 107
Potassium: 4.4 mEq/L (ref 3.5–5.1)
Sodium: 140 (ref 137–147)

## 2021-03-26 LAB — COMPREHENSIVE METABOLIC PANEL
Albumin: 3.8 (ref 3.5–5.0)
Calcium: 9.2 (ref 8.7–10.7)
Globulin: 2.3
eGFR: 66

## 2021-03-26 LAB — HEPATIC FUNCTION PANEL
ALT: 12 U/L (ref 7–35)
AST: 15 (ref 13–35)
Alkaline Phosphatase: 47 (ref 25–125)
Bilirubin, Total: 0.4

## 2021-03-26 LAB — CBC: RBC: 3.55 — AB (ref 3.87–5.11)

## 2021-03-27 DIAGNOSIS — I129 Hypertensive chronic kidney disease with stage 1 through stage 4 chronic kidney disease, or unspecified chronic kidney disease: Secondary | ICD-10-CM | POA: Diagnosis not present

## 2021-03-27 DIAGNOSIS — R29898 Other symptoms and signs involving the musculoskeletal system: Secondary | ICD-10-CM | POA: Diagnosis not present

## 2021-03-27 DIAGNOSIS — R41841 Cognitive communication deficit: Secondary | ICD-10-CM | POA: Diagnosis not present

## 2021-03-27 DIAGNOSIS — R2681 Unsteadiness on feet: Secondary | ICD-10-CM | POA: Diagnosis not present

## 2021-03-27 DIAGNOSIS — M6281 Muscle weakness (generalized): Secondary | ICD-10-CM | POA: Diagnosis not present

## 2021-03-27 DIAGNOSIS — Z9181 History of falling: Secondary | ICD-10-CM | POA: Diagnosis not present

## 2021-03-28 DIAGNOSIS — R41841 Cognitive communication deficit: Secondary | ICD-10-CM | POA: Diagnosis not present

## 2021-03-28 DIAGNOSIS — M6281 Muscle weakness (generalized): Secondary | ICD-10-CM | POA: Diagnosis not present

## 2021-03-28 DIAGNOSIS — R29898 Other symptoms and signs involving the musculoskeletal system: Secondary | ICD-10-CM | POA: Diagnosis not present

## 2021-03-28 DIAGNOSIS — I129 Hypertensive chronic kidney disease with stage 1 through stage 4 chronic kidney disease, or unspecified chronic kidney disease: Secondary | ICD-10-CM | POA: Diagnosis not present

## 2021-03-28 DIAGNOSIS — R2681 Unsteadiness on feet: Secondary | ICD-10-CM | POA: Diagnosis not present

## 2021-03-28 DIAGNOSIS — Z9181 History of falling: Secondary | ICD-10-CM | POA: Diagnosis not present

## 2021-03-31 ENCOUNTER — Encounter: Payer: Self-pay | Admitting: Nurse Practitioner

## 2021-03-31 ENCOUNTER — Non-Acute Institutional Stay: Payer: Medicare Other | Admitting: Nurse Practitioner

## 2021-03-31 DIAGNOSIS — R41841 Cognitive communication deficit: Secondary | ICD-10-CM | POA: Diagnosis not present

## 2021-03-31 DIAGNOSIS — I480 Paroxysmal atrial fibrillation: Secondary | ICD-10-CM | POA: Diagnosis not present

## 2021-03-31 DIAGNOSIS — I25709 Atherosclerosis of coronary artery bypass graft(s), unspecified, with unspecified angina pectoris: Secondary | ICD-10-CM | POA: Diagnosis not present

## 2021-03-31 DIAGNOSIS — K219 Gastro-esophageal reflux disease without esophagitis: Secondary | ICD-10-CM | POA: Diagnosis not present

## 2021-03-31 DIAGNOSIS — Z9989 Dependence on other enabling machines and devices: Secondary | ICD-10-CM

## 2021-03-31 DIAGNOSIS — Z9181 History of falling: Secondary | ICD-10-CM | POA: Diagnosis not present

## 2021-03-31 DIAGNOSIS — M159 Polyosteoarthritis, unspecified: Secondary | ICD-10-CM

## 2021-03-31 DIAGNOSIS — R413 Other amnesia: Secondary | ICD-10-CM | POA: Diagnosis not present

## 2021-03-31 DIAGNOSIS — N183 Chronic kidney disease, stage 3 unspecified: Secondary | ICD-10-CM

## 2021-03-31 DIAGNOSIS — I10 Essential (primary) hypertension: Secondary | ICD-10-CM | POA: Diagnosis not present

## 2021-03-31 DIAGNOSIS — R2681 Unsteadiness on feet: Secondary | ICD-10-CM | POA: Diagnosis not present

## 2021-03-31 DIAGNOSIS — K5901 Slow transit constipation: Secondary | ICD-10-CM | POA: Diagnosis not present

## 2021-03-31 DIAGNOSIS — G4733 Obstructive sleep apnea (adult) (pediatric): Secondary | ICD-10-CM

## 2021-03-31 DIAGNOSIS — R609 Edema, unspecified: Secondary | ICD-10-CM | POA: Diagnosis not present

## 2021-03-31 DIAGNOSIS — I129 Hypertensive chronic kidney disease with stage 1 through stage 4 chronic kidney disease, or unspecified chronic kidney disease: Secondary | ICD-10-CM | POA: Diagnosis not present

## 2021-03-31 DIAGNOSIS — M6281 Muscle weakness (generalized): Secondary | ICD-10-CM | POA: Diagnosis not present

## 2021-03-31 DIAGNOSIS — R29898 Other symptoms and signs involving the musculoskeletal system: Secondary | ICD-10-CM | POA: Diagnosis not present

## 2021-03-31 NOTE — Assessment & Plan Note (Signed)
Blood pressure is controlled, takes Enalapril, Metoprolol.  ?

## 2021-03-31 NOTE — Assessment & Plan Note (Signed)
on CPAP ?

## 2021-03-31 NOTE — Assessment & Plan Note (Signed)
Heart rate is in control,  takes Metoprolol, Eliquis, Amiodarone, f/u cardiology. TSH 1.51 09/05/20 ?

## 2021-03-31 NOTE — Assessment & Plan Note (Signed)
prn Furosemide in the past, mild. TED knee high ?

## 2021-03-31 NOTE — Assessment & Plan Note (Signed)
stable, Hx of CABG Cath 04/25/19, prn NTG, takes Atorvastatin. LDL 74 04/05/20 ?

## 2021-03-31 NOTE — Assessment & Plan Note (Signed)
stable, takes Omeprazole, Hgb 12.4 12/02/20 ?

## 2021-03-31 NOTE — Assessment & Plan Note (Addendum)
reported left hip pain, travels down to the left knee, worsens at night, but no pain last night 04/10/10, started Meloxicam, Methocarbamol 03/25/21,  pending Ortho 04/07/21. X-ray left hip/pelvis r/o acute process. The patient desires to be offered ice pk qhs, she will provide the kind she likes.  ? ?left knee s/p TKR, left hip pain, failed inj x2,  MR lumbar, severe spinal 11/13/20 spinal and foraminal stenosis with left sciatic.  ?

## 2021-03-31 NOTE — Assessment & Plan Note (Signed)
Bun/creat 20//0.9 12/02/20 ?

## 2021-03-31 NOTE — Assessment & Plan Note (Signed)
MMSE 27/30 ?

## 2021-03-31 NOTE — Progress Notes (Addendum)
?Location[facility]  [facility] ?Nursing Home Room Number: 580 ?Place of Service:  ALF (13) ?Provider: Marlana Latus NP ? ?Bonne Whack X, NP ? ?Patient Care Team: ?Oakley Orban X, NP as PCP - General (Internal Medicine) ?Leonie Brit Wernette, MD as PCP - Cardiology (Cardiology) ?Shellee Streng X, NP as Nurse Practitioner (Internal Medicine) ?Lavonna Monarch, MD as Consulting Physician (Dermatology) ? ?Extended Emergency Contact Information ?Primary Emergency Contact: Johnson,Carrie ?Mobile Phone: [phone] ?Relation: Daughter ?Secondary Emergency Contact: Weeks,Tiffany ?Mobile Phone: [phone] ?Relation: Daughter ?Preferred language: English ?Interpreter needed? No ? ?Code Status: DNR ?Goals of care: Advanced Directive information ?Advanced Directives 03/25/2021  ?Does Patient Have a Medical Advance Directive? Yes  ?Type of Paramedic of [place];Out of facility DNR (pink MOST or yellow form);Living will  ?Does patient want to make changes to medical advance directive? No - Patient declined  ?Copy of [place] in Chart? Yes - validated most recent copy scanned in chart (See row information)  ?Pre-existing out of facility DNR order (yellow form or pink MOST form) Yellow form placed in chart (order not valid for inpatient use);Pink MOST form placed in chart (order not valid for inpatient use)  ? ? ? ?Chief Complaint  ?Patient presents with  ?? Acute Visit  ?  Left hip pain  ? ? ?HPI:  ?Pt is a 88 y.o. female seen today for an acute visit for reported left hip pain, travels down to the left knee, worsens at night, but no pain last night 04/10/10, started Meloxicam, Methocarbamol 03/25/21,  pending Ortho 04/07/21. X-ray left hip/pelvis r/o acute process.  ?MRI 11/13/20 her back showed severer spinal and foraminal stenosis with left sciatica ?            Constipation, stable, on Senna, MiraLax.  ?            Afib, takes Metoprolol, Eliquis, Amiodarone, f/u cardiology. TSH 1.51 09/05/20 ?            HTN,  takes Enalapril, Metoprolol.  ?            GERD, stable, takes Omeprazole, Hgb 12.4 12/02/20 ?            CAD, stable, Hx of CABG Cath 04/25/19, prn NTG, takes Atorvastatin. LDL 74 04/05/20 ?            OA, left knee s/p TKR, left hip pain, failed inj x2,  MR lumbar, severe spinal 11/13/20 spinal and foraminal stenosis.  ?            CKD Bun/creat 20//0.9 12/02/20 ?            OSA, on CPAP ?            Cognitive impairment, MMSE 27/30 ?            BLE edema, prn Furosemide in the past, mild.  ?Past Medical History:  ?Diagnosis Date  ?? Atherosclerosis of native coronary artery 2001  ? LAD & Cx disease -- BMS PCI in 2001; CABG 2002.  PCI in 2006 (per-op TAH)  ?? Atrial fibrillation and flutter ([place]) 04/2019  ? New diagnosis-major complaint was chest tightness and dyspnea.  ?? Breast cancer ([place])   ?? Cervix dysplasia   ?? GERD (gastroesophageal reflux disease)   ?? Heart attack ([place]) 2002, 2006  ? Both events reportedly were perioperatively. She has no recollection. Initial PCI was related to angina symptoms (bilateral arm pain)  ?? High cholesterol   ?? Hypertension   ??  Lumbosacral spinal stenosis   ?? Osteoarthritis   ?? Skin cancer   ?? Sleep apnea   ? Uses CPAP faithfully  ?? Squamous cell carcinoma of skin 02/12/2020  ? in situ- left neck-anterior (CX35FU)  ? ?Past Surgical History:  ?Procedure Laterality Date  ?? ABDOMINAL HYSTERECTOMY    ?? bone density    ?? CARDIAC CATHETERIZATION  2001  ?  Dr. Glade Lloyd [facility]) 2 vessel CAD involving LAD-D1 and distal Cx-OM. Appearance of dilated tapered left main with no significant atherosclerosis on IVUS --  BMS PCI of pLAD ([facility]ale BMS 3.0 mm x 15 mm) & with PTCA of ostial D1, PTCA of dLAD.  BMS PCI pCx [facility] BMS 3.5 mm x 11 mm) - unable to reopen dCx-OM (probable distal Atheroembolic occlusion.  Normal LVEF.  ?? COLONOSCOPY    ?? CORONARY ARTERY BYPASS GRAFT  2002  ? Unsure of details [facility] in [facility])  ?? CORONARY STENT INTERVENTION  2001  ? ([place])  [facility]ale BMS PCI pLAD (3.0 mm x 15 mm) -PTCA of jailed D1 as well as distal LAD; pCx ([facility] BMS 3.5 mm x 11 mm) -unable to open distalCx-OM -thromboembolic occlusion  ?? DIAGNOSTIC MAMMOGRAM    ?? LEFT HEART CATH AND CORS/GRAFTS ANGIOGRAPHY N/A 04/25/2019  ? Procedure: LEFT HEART CATH AND CORS/GRAFTS ANGIOGRAPHY;  Surgeon: Troy Sine, MD;  [facility];;; ost-prox LCx stent @ OM1 ostium =100% CTO, Ost-prox LAD stent ~30% ISR, Ost D1 CTO, prox RCA ~30%. SCG-OM2 patent with ostial stent 30%, LIMA-LAD patent; CTO of SVG-D1.   ?? left knee replacement    ?? MASTECTOMY    ?? NM MYOVIEW LTD  12/2014; 2018  ? Cardiologist (Dr. Kellie Simmering.  [place], [place] ph# [phone]) -->LEXISCAN: Nonischemic, "normal "  ?? pap smear    ?? tonsilectomy    ?? TRANSTHORACIC ECHOCARDIOGRAM  12/2014  ? Echocardiogram 12/25/14- normal LVEF and systolic function, EF 68-12%, mild grade 1 diastolic dysfunction  ?? TRANSTHORACIC ECHOCARDIOGRAM  04/24/2019  ? EF 65 to 70%.  Moderate septal LVH.  GR 1 DD-elevated EDP.  Normal PAP.  Relatively normal valves.  No aortic stenosis.  ? ? ?Allergies  ?Allergen Reactions  ?? Codeine Nausea Only  ? ? ?Allergies as of 03/31/2021   ? ?   Reactions  ? Codeine Nausea Only  ? ?  ? ?  ?Medication List  ?  ? ?  ? Accurate as of March 31, 2021 11:59 PM. If you have any questions, ask your nurse or doctor.  ?  ?  ? ?  ? ?acetaminophen 500 MG tablet ?Commonly known as: TYLENOL ?Take 2 tablets (1,000 mg total) by mouth 2 (two) times daily. ?  ?amiodarone 200 MG tablet ?Commonly known as: PACERONE ?Take 0.5 tablets (100 mg total) by mouth daily. ?  ?ammonium lactate 12 % cream ?Commonly known as: AMLACTIN ?Apply topically as needed for dry skin. ?  ?apixaban 5 MG Tabs tablet ?Commonly known as: Eliquis ?Take 1 tablet (5 mg total) by mouth 2 (two) times daily. ?  ?atorvastatin 80 MG tablet ?Commonly known as: LIPITOR ?Take 1 tablet (80 mg total) by mouth daily. ?  ?docusate sodium 100 MG  capsule ?Commonly known as: COLACE ?Take 1 capsule (100 mg total) by mouth daily. ?  ?enalapril 10 MG tablet ?Commonly known as: VASOTEC ?Take 1 tablet (10 mg total) by mouth daily. ?  ?fexofenadine 180 MG tablet ?Commonly known as: ALLEGRA ?Take 1 tablet (180 mg total)  by mouth daily as needed for allergies or rhinitis. ?  ?fluticasone 50 MCG/ACT nasal spray ?Commonly known as: FLONASE ?Place 1 spray into both nostrils daily as needed for allergies or rhinitis. ?  ?metoprolol succinate 50 MG 24 hr tablet ?Commonly known as: TOPROL-XL ?Take 1 tablet (50 mg total) by mouth daily. Take with or immediately following a meal. ?  ?nitroGLYCERIN 0.4 MG SL tablet ?Commonly known as: NITROSTAT ?0.'4mg'$  SL for chest pain, may take up to 3 doses with 5 min apart ?  ?omeprazole 20 MG tablet ?Commonly known as: PRILOSEC OTC ?Take 1 tablet (20 mg total) by mouth daily as needed. ?  ?polyethylene glycol 17 g packet ?Commonly known as: MIRALAX / GLYCOLAX ?Take 17 g by mouth daily. Give with 4 oz of water. ?  ?senna-docusate 8.6-50 MG tablet ?Commonly known as: Senokot-S ?Take 2 tablets by mouth at bedtime. ?  ?traMADol 50 MG tablet ?Commonly known as: Ultram ?Take 1 tablet (50 mg total) by mouth every 6 (six) hours as needed. ?  ?Vitamin D3 50 MCG (2000 UT) Tabs ?Take 2,000 Units by mouth daily after breakfast. ?  ?zinc oxide 20 % ointment ?Apply 1 application topically 3 (three) times daily as needed for irritation. Apply to buttocks after every incontinent episode and as needed for redness. ?  ? ?  ? ? ?Review of Systems  ?Constitutional:  Negative for activity change, appetite change and fever.  ?HENT:  Negative for congestion, hearing loss and voice change.   ?Respiratory:  Negative for cough, chest tightness, shortness of breath and wheezing.   ?Cardiovascular:  Positive for leg swelling.  ?Gastrointestinal:  Negative for abdominal pain and constipation.  ?Genitourinary:  Negative for dysuria and urgency.  ?Musculoskeletal:   Positive for arthralgias and gait problem.  ?     S/p L TKR,  left hip pain  f/u Ortho, injx2. Improved acute pain in the right knee since fall 11/22/20 medial aspect. R hip pain is better since last night 03/30/21  ?Skin:

## 2021-03-31 NOTE — Assessment & Plan Note (Signed)
stable, on Senna, MiraLax.  

## 2021-04-01 DIAGNOSIS — Z9181 History of falling: Secondary | ICD-10-CM | POA: Diagnosis not present

## 2021-04-01 DIAGNOSIS — R2681 Unsteadiness on feet: Secondary | ICD-10-CM | POA: Diagnosis not present

## 2021-04-01 DIAGNOSIS — M6281 Muscle weakness (generalized): Secondary | ICD-10-CM | POA: Diagnosis not present

## 2021-04-01 DIAGNOSIS — I129 Hypertensive chronic kidney disease with stage 1 through stage 4 chronic kidney disease, or unspecified chronic kidney disease: Secondary | ICD-10-CM | POA: Diagnosis not present

## 2021-04-01 DIAGNOSIS — R29898 Other symptoms and signs involving the musculoskeletal system: Secondary | ICD-10-CM | POA: Diagnosis not present

## 2021-04-01 DIAGNOSIS — R41841 Cognitive communication deficit: Secondary | ICD-10-CM | POA: Diagnosis not present

## 2021-04-02 DIAGNOSIS — I129 Hypertensive chronic kidney disease with stage 1 through stage 4 chronic kidney disease, or unspecified chronic kidney disease: Secondary | ICD-10-CM | POA: Diagnosis not present

## 2021-04-02 DIAGNOSIS — R2681 Unsteadiness on feet: Secondary | ICD-10-CM | POA: Diagnosis not present

## 2021-04-02 DIAGNOSIS — R41841 Cognitive communication deficit: Secondary | ICD-10-CM | POA: Diagnosis not present

## 2021-04-02 DIAGNOSIS — M6281 Muscle weakness (generalized): Secondary | ICD-10-CM | POA: Diagnosis not present

## 2021-04-02 DIAGNOSIS — R29898 Other symptoms and signs involving the musculoskeletal system: Secondary | ICD-10-CM | POA: Diagnosis not present

## 2021-04-02 DIAGNOSIS — Z9181 History of falling: Secondary | ICD-10-CM | POA: Diagnosis not present

## 2021-04-03 DIAGNOSIS — Z1231 Encounter for screening mammogram for malignant neoplasm of breast: Secondary | ICD-10-CM | POA: Diagnosis not present

## 2021-04-04 DIAGNOSIS — R41841 Cognitive communication deficit: Secondary | ICD-10-CM | POA: Diagnosis not present

## 2021-04-04 DIAGNOSIS — R2681 Unsteadiness on feet: Secondary | ICD-10-CM | POA: Diagnosis not present

## 2021-04-04 DIAGNOSIS — M6281 Muscle weakness (generalized): Secondary | ICD-10-CM | POA: Diagnosis not present

## 2021-04-04 DIAGNOSIS — Z9181 History of falling: Secondary | ICD-10-CM | POA: Diagnosis not present

## 2021-04-04 DIAGNOSIS — I129 Hypertensive chronic kidney disease with stage 1 through stage 4 chronic kidney disease, or unspecified chronic kidney disease: Secondary | ICD-10-CM | POA: Diagnosis not present

## 2021-04-04 DIAGNOSIS — R29898 Other symptoms and signs involving the musculoskeletal system: Secondary | ICD-10-CM | POA: Diagnosis not present

## 2021-04-07 DIAGNOSIS — R2681 Unsteadiness on feet: Secondary | ICD-10-CM | POA: Diagnosis not present

## 2021-04-07 DIAGNOSIS — I129 Hypertensive chronic kidney disease with stage 1 through stage 4 chronic kidney disease, or unspecified chronic kidney disease: Secondary | ICD-10-CM | POA: Diagnosis not present

## 2021-04-07 DIAGNOSIS — Z9181 History of falling: Secondary | ICD-10-CM | POA: Diagnosis not present

## 2021-04-07 DIAGNOSIS — R29898 Other symptoms and signs involving the musculoskeletal system: Secondary | ICD-10-CM | POA: Diagnosis not present

## 2021-04-07 DIAGNOSIS — R41841 Cognitive communication deficit: Secondary | ICD-10-CM | POA: Diagnosis not present

## 2021-04-07 DIAGNOSIS — M25552 Pain in left hip: Secondary | ICD-10-CM | POA: Diagnosis not present

## 2021-04-07 DIAGNOSIS — M6281 Muscle weakness (generalized): Secondary | ICD-10-CM | POA: Diagnosis not present

## 2021-04-09 DIAGNOSIS — R2681 Unsteadiness on feet: Secondary | ICD-10-CM | POA: Diagnosis not present

## 2021-04-09 DIAGNOSIS — R41841 Cognitive communication deficit: Secondary | ICD-10-CM | POA: Diagnosis not present

## 2021-04-09 DIAGNOSIS — Z9181 History of falling: Secondary | ICD-10-CM | POA: Diagnosis not present

## 2021-04-09 DIAGNOSIS — R29898 Other symptoms and signs involving the musculoskeletal system: Secondary | ICD-10-CM | POA: Diagnosis not present

## 2021-04-09 DIAGNOSIS — M6281 Muscle weakness (generalized): Secondary | ICD-10-CM | POA: Diagnosis not present

## 2021-04-09 DIAGNOSIS — I129 Hypertensive chronic kidney disease with stage 1 through stage 4 chronic kidney disease, or unspecified chronic kidney disease: Secondary | ICD-10-CM | POA: Diagnosis not present

## 2021-04-10 DIAGNOSIS — R2681 Unsteadiness on feet: Secondary | ICD-10-CM | POA: Diagnosis not present

## 2021-04-10 DIAGNOSIS — R41841 Cognitive communication deficit: Secondary | ICD-10-CM | POA: Diagnosis not present

## 2021-04-10 DIAGNOSIS — I129 Hypertensive chronic kidney disease with stage 1 through stage 4 chronic kidney disease, or unspecified chronic kidney disease: Secondary | ICD-10-CM | POA: Diagnosis not present

## 2021-04-10 DIAGNOSIS — Z9181 History of falling: Secondary | ICD-10-CM | POA: Diagnosis not present

## 2021-04-10 DIAGNOSIS — R29898 Other symptoms and signs involving the musculoskeletal system: Secondary | ICD-10-CM | POA: Diagnosis not present

## 2021-04-10 DIAGNOSIS — M6281 Muscle weakness (generalized): Secondary | ICD-10-CM | POA: Diagnosis not present

## 2021-04-11 DIAGNOSIS — M6281 Muscle weakness (generalized): Secondary | ICD-10-CM | POA: Diagnosis not present

## 2021-04-11 DIAGNOSIS — R29898 Other symptoms and signs involving the musculoskeletal system: Secondary | ICD-10-CM | POA: Diagnosis not present

## 2021-04-11 DIAGNOSIS — Z9181 History of falling: Secondary | ICD-10-CM | POA: Diagnosis not present

## 2021-04-11 DIAGNOSIS — R2681 Unsteadiness on feet: Secondary | ICD-10-CM | POA: Diagnosis not present

## 2021-04-11 DIAGNOSIS — I129 Hypertensive chronic kidney disease with stage 1 through stage 4 chronic kidney disease, or unspecified chronic kidney disease: Secondary | ICD-10-CM | POA: Diagnosis not present

## 2021-04-11 DIAGNOSIS — R41841 Cognitive communication deficit: Secondary | ICD-10-CM | POA: Diagnosis not present

## 2021-04-14 DIAGNOSIS — R2681 Unsteadiness on feet: Secondary | ICD-10-CM | POA: Diagnosis not present

## 2021-04-14 DIAGNOSIS — R531 Weakness: Secondary | ICD-10-CM | POA: Diagnosis not present

## 2021-04-14 DIAGNOSIS — R29898 Other symptoms and signs involving the musculoskeletal system: Secondary | ICD-10-CM | POA: Diagnosis not present

## 2021-04-14 DIAGNOSIS — Z9181 History of falling: Secondary | ICD-10-CM | POA: Diagnosis not present

## 2021-04-14 DIAGNOSIS — R41841 Cognitive communication deficit: Secondary | ICD-10-CM | POA: Diagnosis not present

## 2021-04-14 DIAGNOSIS — I129 Hypertensive chronic kidney disease with stage 1 through stage 4 chronic kidney disease, or unspecified chronic kidney disease: Secondary | ICD-10-CM | POA: Diagnosis not present

## 2021-04-14 DIAGNOSIS — M6281 Muscle weakness (generalized): Secondary | ICD-10-CM | POA: Diagnosis not present

## 2021-04-15 DIAGNOSIS — R41841 Cognitive communication deficit: Secondary | ICD-10-CM | POA: Diagnosis not present

## 2021-04-15 DIAGNOSIS — R29898 Other symptoms and signs involving the musculoskeletal system: Secondary | ICD-10-CM | POA: Diagnosis not present

## 2021-04-15 DIAGNOSIS — M6281 Muscle weakness (generalized): Secondary | ICD-10-CM | POA: Diagnosis not present

## 2021-04-15 DIAGNOSIS — R2681 Unsteadiness on feet: Secondary | ICD-10-CM | POA: Diagnosis not present

## 2021-04-15 DIAGNOSIS — I129 Hypertensive chronic kidney disease with stage 1 through stage 4 chronic kidney disease, or unspecified chronic kidney disease: Secondary | ICD-10-CM | POA: Diagnosis not present

## 2021-04-15 DIAGNOSIS — Z9181 History of falling: Secondary | ICD-10-CM | POA: Diagnosis not present

## 2021-04-16 ENCOUNTER — Encounter: Payer: Self-pay | Admitting: Orthopedic Surgery

## 2021-04-16 ENCOUNTER — Non-Acute Institutional Stay: Payer: Medicare Other | Admitting: Orthopedic Surgery

## 2021-04-16 DIAGNOSIS — R41841 Cognitive communication deficit: Secondary | ICD-10-CM | POA: Diagnosis not present

## 2021-04-16 DIAGNOSIS — Z9181 History of falling: Secondary | ICD-10-CM | POA: Diagnosis not present

## 2021-04-16 DIAGNOSIS — F320 Major depressive disorder, single episode, mild: Secondary | ICD-10-CM

## 2021-04-16 DIAGNOSIS — M6281 Muscle weakness (generalized): Secondary | ICD-10-CM | POA: Diagnosis not present

## 2021-04-16 DIAGNOSIS — I129 Hypertensive chronic kidney disease with stage 1 through stage 4 chronic kidney disease, or unspecified chronic kidney disease: Secondary | ICD-10-CM | POA: Diagnosis not present

## 2021-04-16 DIAGNOSIS — R2681 Unsteadiness on feet: Secondary | ICD-10-CM | POA: Diagnosis not present

## 2021-04-16 DIAGNOSIS — R29898 Other symptoms and signs involving the musculoskeletal system: Secondary | ICD-10-CM | POA: Diagnosis not present

## 2021-04-16 NOTE — Progress Notes (Signed)
?Location:  [facility] ?Nursing Home Room Number: [id number] ?Place of Service:  ALF (13) ?Provider: Yvonna Alanis, NP ? ?Patient Care Team: ?Mast, [name], NP as PCP - General (Internal Medicine) ?Leonie Man, MD as PCP - Cardiology (Cardiology) ?Mast, [name], NP as Nurse Practitioner (Internal Medicine) ?Lavonna Monarch, MD as Consulting Physician (Dermatology) ? ?Extended Emergency Contact Information ?Primary Emergency Contact: Stephanie Shea ?Mobile Phone: [phone] ?Relation: Daughter ?Secondary Emergency Contact: Stephanie Shea ?Mobile Phone: [phone] ?Relation: Daughter ?Preferred language: English ?Interpreter needed? No ? ?Code Status:  Full code ?Goals of care: Advanced Directive information ? ?  04/16/2021  ?  3:13 PM  ?Advanced Directives  ?Does Patient Have a Medical Advance Directive? Yes  ?Type of Paramedic of [place];Out of facility DNR (pink MOST or yellow form);Living will  ?Does patient want to make changes to medical advance directive? No - Patient declined  ?Copy of [place] in Chart? Yes - validated most recent copy scanned in chart (See row information)  ?Pre-existing out of facility DNR order (yellow form or pink MOST form) Yellow form placed in chart (order not valid for inpatient use);Pink MOST form placed in chart (order not valid for inpatient use)  ? ? ? ?Chief Complaint  ?Patient presents with  ? Acute Visit  ?  Depression  ? ? ?HPI:  ?Pt is a 88 y.o. female seen today for an acute visit for depression.  ? ?She reports feeling more "blue" and tearful since move to [place]. Upset her mobility has not progressed. Wishes she was healthy enough to go back to [place]. Every time her left hip pain or back hurts she becomes more discouraged and sad. Suicidal thoughts or anxiety. Appetite good. Sleeping well at night. Denies Treatment options discussed with patient and daughter. They would like to try antidepressant medication.   ? ? ?Past Medical History:  ?Diagnosis Date  ? Atherosclerosis of native coronary artery 2001  ? LAD & Cx disease -- BMS PCI in 2001; CABG 2002.  PCI in 2006 (per-op TAH)  ? Atrial fibrillation and flutter ([place]) 04/2019  ? New diagnosis-major complaint was chest tightness and dyspnea.  ? Breast cancer ([place])   ? Cervix dysplasia   ? GERD (gastroesophageal reflux disease)   ? Heart attack ([place]) 2002, 2006  ? Both events reportedly were perioperatively. She has no recollection. Initial PCI was related to angina symptoms (bilateral arm pain)  ? High cholesterol   ? Hypertension   ? Lumbosacral spinal stenosis   ? Osteoarthritis   ? Skin cancer   ? Sleep apnea   ? Uses CPAP faithfully  ? Squamous cell carcinoma of skin 02/12/2020  ? in situ- left neck-anterior (CX35FU)  ? ?Past Surgical History:  ?Procedure Laterality Date  ? ABDOMINAL HYSTERECTOMY    ? bone density    ? CARDIAC CATHETERIZATION  2001  ?  Dr. Glade Lloyd [facility]) 2 vessel CAD involving LAD-D1 and distal Cx-OM. Appearance of dilated tapered left main with no significant atherosclerosis on IVUS --  BMS PCI of pLAD ([facility] BMS 3.0 mm x 15 mm) & with PTCA of ostial D1, PTCA of dLAD.  BMS PCI pCx [facility] BMS 3.5 mm x 11 mm) - unable to reopen dCx-OM (probable distal Atheroembolic occlusion.  Normal LVEF.  ? COLONOSCOPY    ? CORONARY ARTERY BYPASS GRAFT  2002  ? Unsure of details [facility], in [facility])  ? CORONARY STENT INTERVENTION  2001  ? [facility])  [facility] BMS PCI pLAD (3.0 mm x 15 mm) -PTCA of jailed D1 as well as distal LAD; pCx ([facility] BMS 3.5 mm x 11 mm) -unable to open distalCx-OM -thromboembolic occlusion  ? DIAGNOSTIC MAMMOGRAM    ? LEFT HEART CATH AND CORS/GRAFTS ANGIOGRAPHY N/A 04/25/2019  ? Procedure: LEFT HEART CATH AND CORS/GRAFTS ANGIOGRAPHY;  Surgeon: Troy Sine, MD;  [facility];;; ost-prox LCx stent @ OM1 ostium =100% CTO, Ost-prox LAD stent ~30% ISR, Ost D1 CTO, prox RCA ~30%. SCG-OM2 patent with ostial stent 30%,  LIMA-LAD patent; CTO of SVG-D1.   ? left knee replacement    ? MASTECTOMY    ? NM MYOVIEW LTD  12/2014; 2018  ? Cardiologist (Dr. Kellie Simmering.  [place], [place] ph# [phone]) -->LEXISCAN: Nonischemic, "normal "  ? pap smear    ? tonsilectomy    ? TRANSTHORACIC ECHOCARDIOGRAM  12/2014  ? Echocardiogram 12/25/14- normal LVEF and systolic function, EF 09-81%, mild grade 1 diastolic dysfunction  ? TRANSTHORACIC ECHOCARDIOGRAM  04/24/2019  ? EF 65 to 70%.  Moderate septal LVH.  GR 1 DD-elevated EDP.  Normal PAP.  Relatively normal valves.  No aortic stenosis.  ? ? ?Allergies  ?Allergen Reactions  ? Codeine Nausea Only  ? ? ?Outpatient Encounter Medications as of 04/16/2021  ?Medication Sig  ? acetaminophen (TYLENOL) 500 MG tablet Take 2 tablets (1,000 mg total) by mouth 2 (two) times daily.  ? amiodarone (PACERONE) 200 MG tablet Take 0.5 tablets (100 mg total) by mouth daily.  ? ammonium lactate (AMLACTIN) 12 % cream Apply topically as needed for dry skin.  ? apixaban (ELIQUIS) 5 MG TABS tablet Take 1 tablet (5 mg total) by mouth 2 (two) times daily.  ? atorvastatin (LIPITOR) 80 MG tablet Take 1 tablet (80 mg total) by mouth daily.  ? Cholecalciferol (VITAMIN D3) 50 MCG (2000 UT) TABS Take 2,000 Units by mouth daily after breakfast.  ? docusate sodium (COLACE) 100 MG capsule Take 1 capsule (100 mg total) by mouth daily.  ? enalapril (VASOTEC) 10 MG tablet Take 1 tablet (10 mg total) by mouth daily.  ? fexofenadine (ALLEGRA) 180 MG tablet Take 1 tablet (180 mg total) by mouth daily as needed for allergies or rhinitis.  ? fluticasone (FLONASE) 50 MCG/ACT nasal spray Place 1 spray into both nostrils daily as needed for allergies or rhinitis.  ? methocarbamol (ROBAXIN) 500 MG tablet Take 250 mg by mouth at bedtime.  ? metoprolol succinate (TOPROL-XL) 50 MG 24 hr tablet Take 1 tablet (50 mg total) by mouth daily. Take with or immediately following a meal.  ? nitroGLYCERIN (NITROSTAT) 0.4 MG SL tablet Place 0.4 mg under the tongue  as needed for chest pain (Every 5 minutes x 3 doses).  ? omeprazole (PRILOSEC OTC) 20 MG tablet Take 1 tablet (20 mg total) by mouth daily as needed.  ? polyethylene glycol (MIRALAX / GLYCOLAX) 17 g packet Take 17 g by mouth daily. Give with 4 oz of water.  ? senna-docusate (SENOKOT-S) 8.6-50 MG tablet Take 2 tablets by mouth at bedtime.  ? traMADol (ULTRAM) 50 MG tablet Take 1 tablet (50 mg total) by mouth every 6 (six) hours as needed.  ? zinc oxide 20 % ointment Apply 1 application topically 3 (three) times daily as needed for irritation. Apply to buttocks after every incontinent episode and as needed for redness.  ? [DISCONTINUED] nitroGLYCERIN (NITROSTAT) 0.4 MG SL tablet 0.'4mg'$  SL for chest pain, may take up to 3 doses with 5  min apart (Patient taking differently: Place 0.4 mg under the tongue as needed. 0.'4mg'$  SL for chest pain, may take up to 3 doses with 5 min apart)  ? ?No facility-administered encounter medications on file as of 04/16/2021.  ? ? ?Review of Systems  ?Constitutional:  Negative for activity change, appetite change, chills, fatigue and fever.  ?Respiratory:  Negative for cough, shortness of breath and wheezing.   ?Cardiovascular:  Negative for chest pain and leg swelling.  ?Musculoskeletal:  Positive for arthralgias, back pain and gait problem.  ?Psychiatric/Behavioral:  Positive for dysphoric mood. Negative for confusion and sleep disturbance. The patient is not nervous/anxious.   ? ?Immunization History  ?Administered Date(s) Administered  ? Fluad Quad(high Dose 65+) 10/28/2020  ? Influenza, High Dose Seasonal PF 10/21/2016, 09/24/2017, 10/25/2019  ? Influenza-Unspecified 10/03/2011, 10/04/2012, 01/13/2016  ? Moderna SARS-COV2 Booster Vaccination 11/21/2019, 06/11/2020  ? Moderna Sars-Covid-2 Vaccination 01/16/2019, 02/13/2019, 03/13/2019  ? Pneumococcal-Unspecified 01/13/2016  ? Tetanus 12/01/2017  ? Zoster Recombinat (Shingrix) 08/24/2018, 02/22/2020  ? ?Pertinent  Health Maintenance Due   ?Topic Date Due  ? HEMOGLOBIN A1C  12/01/2019  ? OPHTHALMOLOGY EXAM  05/30/2021  ? INFLUENZA VACCINE  08/12/2021  ? FOOT EXAM  10/17/2021  ? DEXA SCAN  Completed  ? ? ?  04/25/2019  ?  7:00 AM 05/25/2019  ?  1:28 P

## 2021-04-18 DIAGNOSIS — I129 Hypertensive chronic kidney disease with stage 1 through stage 4 chronic kidney disease, or unspecified chronic kidney disease: Secondary | ICD-10-CM | POA: Diagnosis not present

## 2021-04-18 DIAGNOSIS — Z9181 History of falling: Secondary | ICD-10-CM | POA: Diagnosis not present

## 2021-04-18 DIAGNOSIS — M6281 Muscle weakness (generalized): Secondary | ICD-10-CM | POA: Diagnosis not present

## 2021-04-18 DIAGNOSIS — R29898 Other symptoms and signs involving the musculoskeletal system: Secondary | ICD-10-CM | POA: Diagnosis not present

## 2021-04-18 DIAGNOSIS — R2681 Unsteadiness on feet: Secondary | ICD-10-CM | POA: Diagnosis not present

## 2021-04-18 DIAGNOSIS — R41841 Cognitive communication deficit: Secondary | ICD-10-CM | POA: Diagnosis not present

## 2021-04-21 DIAGNOSIS — M6281 Muscle weakness (generalized): Secondary | ICD-10-CM | POA: Diagnosis not present

## 2021-04-21 DIAGNOSIS — Z9181 History of falling: Secondary | ICD-10-CM | POA: Diagnosis not present

## 2021-04-21 DIAGNOSIS — R41841 Cognitive communication deficit: Secondary | ICD-10-CM | POA: Diagnosis not present

## 2021-04-21 DIAGNOSIS — R29898 Other symptoms and signs involving the musculoskeletal system: Secondary | ICD-10-CM | POA: Diagnosis not present

## 2021-04-21 DIAGNOSIS — R2681 Unsteadiness on feet: Secondary | ICD-10-CM | POA: Diagnosis not present

## 2021-04-21 DIAGNOSIS — I129 Hypertensive chronic kidney disease with stage 1 through stage 4 chronic kidney disease, or unspecified chronic kidney disease: Secondary | ICD-10-CM | POA: Diagnosis not present

## 2021-04-22 DIAGNOSIS — R2681 Unsteadiness on feet: Secondary | ICD-10-CM | POA: Diagnosis not present

## 2021-04-22 DIAGNOSIS — R29898 Other symptoms and signs involving the musculoskeletal system: Secondary | ICD-10-CM | POA: Diagnosis not present

## 2021-04-22 DIAGNOSIS — R41841 Cognitive communication deficit: Secondary | ICD-10-CM | POA: Diagnosis not present

## 2021-04-22 DIAGNOSIS — Z9181 History of falling: Secondary | ICD-10-CM | POA: Diagnosis not present

## 2021-04-22 DIAGNOSIS — I129 Hypertensive chronic kidney disease with stage 1 through stage 4 chronic kidney disease, or unspecified chronic kidney disease: Secondary | ICD-10-CM | POA: Diagnosis not present

## 2021-04-22 DIAGNOSIS — M6281 Muscle weakness (generalized): Secondary | ICD-10-CM | POA: Diagnosis not present

## 2021-04-23 DIAGNOSIS — M6281 Muscle weakness (generalized): Secondary | ICD-10-CM | POA: Diagnosis not present

## 2021-04-23 DIAGNOSIS — R2681 Unsteadiness on feet: Secondary | ICD-10-CM | POA: Diagnosis not present

## 2021-04-23 DIAGNOSIS — R29898 Other symptoms and signs involving the musculoskeletal system: Secondary | ICD-10-CM | POA: Diagnosis not present

## 2021-04-23 DIAGNOSIS — I129 Hypertensive chronic kidney disease with stage 1 through stage 4 chronic kidney disease, or unspecified chronic kidney disease: Secondary | ICD-10-CM | POA: Diagnosis not present

## 2021-04-23 DIAGNOSIS — R41841 Cognitive communication deficit: Secondary | ICD-10-CM | POA: Diagnosis not present

## 2021-04-23 DIAGNOSIS — Z9181 History of falling: Secondary | ICD-10-CM | POA: Diagnosis not present

## 2021-04-24 DIAGNOSIS — R2681 Unsteadiness on feet: Secondary | ICD-10-CM | POA: Diagnosis not present

## 2021-04-24 DIAGNOSIS — M6281 Muscle weakness (generalized): Secondary | ICD-10-CM | POA: Diagnosis not present

## 2021-04-24 DIAGNOSIS — I129 Hypertensive chronic kidney disease with stage 1 through stage 4 chronic kidney disease, or unspecified chronic kidney disease: Secondary | ICD-10-CM | POA: Diagnosis not present

## 2021-04-24 DIAGNOSIS — R41841 Cognitive communication deficit: Secondary | ICD-10-CM | POA: Diagnosis not present

## 2021-04-24 DIAGNOSIS — R29898 Other symptoms and signs involving the musculoskeletal system: Secondary | ICD-10-CM | POA: Diagnosis not present

## 2021-04-24 DIAGNOSIS — Z9181 History of falling: Secondary | ICD-10-CM | POA: Diagnosis not present

## 2021-04-25 DIAGNOSIS — R29898 Other symptoms and signs involving the musculoskeletal system: Secondary | ICD-10-CM | POA: Diagnosis not present

## 2021-04-25 DIAGNOSIS — R41841 Cognitive communication deficit: Secondary | ICD-10-CM | POA: Diagnosis not present

## 2021-04-25 DIAGNOSIS — Z9181 History of falling: Secondary | ICD-10-CM | POA: Diagnosis not present

## 2021-04-25 DIAGNOSIS — R2681 Unsteadiness on feet: Secondary | ICD-10-CM | POA: Diagnosis not present

## 2021-04-25 DIAGNOSIS — I129 Hypertensive chronic kidney disease with stage 1 through stage 4 chronic kidney disease, or unspecified chronic kidney disease: Secondary | ICD-10-CM | POA: Diagnosis not present

## 2021-04-25 DIAGNOSIS — M6281 Muscle weakness (generalized): Secondary | ICD-10-CM | POA: Diagnosis not present

## 2021-04-28 DIAGNOSIS — R2681 Unsteadiness on feet: Secondary | ICD-10-CM | POA: Diagnosis not present

## 2021-04-28 DIAGNOSIS — I129 Hypertensive chronic kidney disease with stage 1 through stage 4 chronic kidney disease, or unspecified chronic kidney disease: Secondary | ICD-10-CM | POA: Diagnosis not present

## 2021-04-28 DIAGNOSIS — R41841 Cognitive communication deficit: Secondary | ICD-10-CM | POA: Diagnosis not present

## 2021-04-28 DIAGNOSIS — M6281 Muscle weakness (generalized): Secondary | ICD-10-CM | POA: Diagnosis not present

## 2021-04-28 DIAGNOSIS — R29898 Other symptoms and signs involving the musculoskeletal system: Secondary | ICD-10-CM | POA: Diagnosis not present

## 2021-04-28 DIAGNOSIS — Z9181 History of falling: Secondary | ICD-10-CM | POA: Diagnosis not present

## 2021-04-29 ENCOUNTER — Non-Acute Institutional Stay: Payer: Medicare Other | Admitting: Nurse Practitioner

## 2021-04-29 ENCOUNTER — Encounter: Payer: Self-pay | Admitting: Nurse Practitioner

## 2021-04-29 DIAGNOSIS — K219 Gastro-esophageal reflux disease without esophagitis: Secondary | ICD-10-CM | POA: Diagnosis not present

## 2021-04-29 DIAGNOSIS — I480 Paroxysmal atrial fibrillation: Secondary | ICD-10-CM | POA: Diagnosis not present

## 2021-04-29 DIAGNOSIS — N183 Chronic kidney disease, stage 3 unspecified: Secondary | ICD-10-CM

## 2021-04-29 DIAGNOSIS — R413 Other amnesia: Secondary | ICD-10-CM

## 2021-04-29 DIAGNOSIS — Z9989 Dependence on other enabling machines and devices: Secondary | ICD-10-CM

## 2021-04-29 DIAGNOSIS — I25119 Atherosclerotic heart disease of native coronary artery with unspecified angina pectoris: Secondary | ICD-10-CM | POA: Diagnosis not present

## 2021-04-29 DIAGNOSIS — R400 Somnolence: Secondary | ICD-10-CM | POA: Diagnosis not present

## 2021-04-29 DIAGNOSIS — F5105 Insomnia due to other mental disorder: Secondary | ICD-10-CM | POA: Diagnosis not present

## 2021-04-29 DIAGNOSIS — I10 Essential (primary) hypertension: Secondary | ICD-10-CM

## 2021-04-29 DIAGNOSIS — F418 Other specified anxiety disorders: Secondary | ICD-10-CM | POA: Diagnosis not present

## 2021-04-29 DIAGNOSIS — K5901 Slow transit constipation: Secondary | ICD-10-CM

## 2021-04-29 DIAGNOSIS — G4733 Obstructive sleep apnea (adult) (pediatric): Secondary | ICD-10-CM | POA: Diagnosis not present

## 2021-04-29 DIAGNOSIS — R41841 Cognitive communication deficit: Secondary | ICD-10-CM | POA: Diagnosis not present

## 2021-04-29 DIAGNOSIS — I129 Hypertensive chronic kidney disease with stage 1 through stage 4 chronic kidney disease, or unspecified chronic kidney disease: Secondary | ICD-10-CM | POA: Diagnosis not present

## 2021-04-29 DIAGNOSIS — Z9181 History of falling: Secondary | ICD-10-CM | POA: Diagnosis not present

## 2021-04-29 DIAGNOSIS — M48061 Spinal stenosis, lumbar region without neurogenic claudication: Secondary | ICD-10-CM | POA: Diagnosis not present

## 2021-04-29 DIAGNOSIS — M6281 Muscle weakness (generalized): Secondary | ICD-10-CM | POA: Diagnosis not present

## 2021-04-29 DIAGNOSIS — R29898 Other symptoms and signs involving the musculoskeletal system: Secondary | ICD-10-CM | POA: Diagnosis not present

## 2021-04-29 DIAGNOSIS — R2681 Unsteadiness on feet: Secondary | ICD-10-CM | POA: Diagnosis not present

## 2021-04-29 NOTE — Progress Notes (Signed)
?Location:   [facility] ?Nursing Home Room Number: 262 ?Place of Service:  ALF (13) ?Provider:  Marlana Latus NP ? ?Esma Kilts X, NP ? ?Patient Care Team: ?Laekyn Rayos X, NP as PCP - General (Internal Medicine) ?Leonie Rashawna Scoles, MD as PCP - Cardiology (Cardiology) ?Ladiamond Gallina X, NP as Nurse Practitioner (Internal Medicine) ?Lavonna Monarch, MD as Consulting Physician (Dermatology) ? ?Extended Emergency Contact Information ?Primary Emergency Contact: Johnson,Carrie ?Mobile Phone: [phone] ?Relation: Daughter ?Secondary Emergency Contact: Weeks,Tiffany ?Mobile Phone: [phone] ?Relation: Daughter ?Preferred language: English ?Interpreter needed? No ? ?Code Status:  Full Code ?Goals of care: Advanced Directive information ? ?  04/29/2021  ?  3:43 PM  ?Advanced Directives  ?Does Patient Have a Medical Advance Directive? Yes  ?Type of Paramedic of [place];Out of facility DNR (pink MOST or yellow form);Living will  ?Does patient want to make changes to medical advance directive? No - Patient declined  ?Copy of [place] in Chart? Yes - validated most recent copy scanned in chart (See row information)  ?Pre-existing out of facility DNR order (yellow form or pink MOST form) Yellow form placed in chart (order not valid for inpatient use);Pink MOST form placed in chart (order not valid for inpatient use)  ? ? ? ?Chief Complaint  ?Patient presents with  ? Acute Visit  ?  Medication concerns  ? ? ?HPI:  ?Pt is a 88 y.o. female seen today for an acute visit for 04/29/21 daughter reported the patient is having unpleasant side effects from Zoloft: drowsy, more difficulty with her speech, tongue seems thick. No focal neurological symptoms noted. Sertraline started 04/16/21 ?  left hip pain, travels down to the left knee, worsens at night, improved, on Meloxicam, Methocarbamol, 04/07/21. X-ray left hip/pelvis r/o acute process. ?OA, left knee s/p TKR, left hip pain, failed inj  x2,  MR lumbar, severe spinal 11/13/20 spinal and foraminal stenosis with left sciatica.  ?            Constipation, stable, on Senna, MiraLax.  ?            Afib, takes Metoprolol, Eliquis, Amiodarone, f/u cardiology. TSH 1.51 09/05/20 ?            HTN, takes Enalapril, Metoprolol.  ?            GERD, stable, takes Omeprazole, Hgb 11.9 03/26/21 ?            CAD, stable, Hx of CABG Cath 04/25/19, prn NTG, takes Atorvastatin. LDL 74 3/25/2 ?            CKD Bun/creat 21/0.9 03/26/21 ?            OSA, on CPAP ?            Cognitive impairment, MMSE 27/30 ?            BLE edema, mild.  ? ?Past Medical History:  ?Diagnosis Date  ? Atherosclerosis of native coronary artery 2001  ? LAD & Cx disease -- BMS PCI in 2001; CABG 2002.  PCI in 2006 (per-op TAH)  ? Atrial fibrillation and flutter ([place]) 04/2019  ? New diagnosis-major complaint was chest tightness and dyspnea.  ? Breast cancer ([place])   ? Cervix dysplasia   ? GERD (gastroesophageal reflux disease)   ? Heart attack ([place]) 2002, 2006  ? Both events reportedly were perioperatively. She has no recollection. Initial PCI was related to angina symptoms (bilateral arm pain)  ? High  cholesterol   ? Hypertension   ? Lumbosacral spinal stenosis   ? Osteoarthritis   ? Skin cancer   ? Sleep apnea   ? Uses CPAP faithfully  ? Squamous cell carcinoma of skin 02/12/2020  ? in situ- left neck-anterior (CX35FU)  ? ?Past Surgical History:  ?Procedure Laterality Date  ? ABDOMINAL HYSTERECTOMY    ? bone density    ? CARDIAC CATHETERIZATION  2001  ?  Dr. Glade Lloyd [facility]) 2 vessel CAD involving LAD-D1 and distal Cx-OM. Appearance of dilated tapered left main with no significant atherosclerosis on IVUS --  BMS PCI of pLAD ([facility] BMS 3.0 mm x 15 mm) & with PTCA of ostial D1, PTCA of dLAD.  BMS PCI pCx [facility] BMS 3.5 mm x 11 mm) - unable to reopen dCx-OM (probable distal Atheroembolic occlusion.  Normal LVEF.  ? COLONOSCOPY    ? CORONARY ARTERY BYPASS GRAFT  2002  ? Unsure of details [facility], in [facility])  ? CORONARY STENT INTERVENTION  2001  ? ([place]) [facility] BMS PCI pLAD (3.0 mm x 15 mm) -PTCA of jailed D1 as well as distal LAD; pCx ([facility] BMS 3.5 mm x 11 mm) -unable to open distalCx-OM -thromboembolic occlusion  ? DIAGNOSTIC MAMMOGRAM    ? LEFT HEART CATH AND CORS/GRAFTS ANGIOGRAPHY N/A 04/25/2019  ? Procedure: LEFT HEART CATH AND CORS/GRAFTS ANGIOGRAPHY;  Surgeon: Troy Sine, MD;  [facility];;; ost-prox LCx stent @ OM1 ostium =100% CTO, Ost-prox LAD stent ~30% ISR, Ost D1 CTO, prox RCA ~30%. SCG-OM2 patent with ostial stent 30%, LIMA-LAD patent; CTO of SVG-D1.   ? left knee replacement    ? MASTECTOMY    ? NM MYOVIEW LTD  12/2014; 2018  ? Cardiologist (Dr. Kellie Simmering.  [place], [place] ph# [phone]) -->LEXISCAN: Nonischemic, "normal "  ? pap smear    ? tonsilectomy    ? TRANSTHORACIC ECHOCARDIOGRAM  12/2014  ? Echocardiogram 12/25/14- normal LVEF and systolic function, EF 87-56%, mild grade 1 diastolic dysfunction  ? TRANSTHORACIC ECHOCARDIOGRAM  04/24/2019  ? EF 65 to 70%.  Moderate septal LVH.  GR 1 DD-elevated EDP.  Normal PAP.  Relatively normal valves.  No aortic stenosis.  ? ? ?Allergies  ?Allergen Reactions  ? Codeine Nausea Only  ? ? ?Allergies as of 04/29/2021   ? ?   Reactions  ? Codeine Nausea Only  ? ?  ? ?  ?Medication List  ?  ? ?  ? Accurate as of April 29, 2021 11:59 PM. If you have any questions, ask your nurse or doctor.  ?  ?  ? ?  ? ?acetaminophen 500 MG tablet ?Commonly known as: TYLENOL ?Take 2 tablets (1,000 mg total) by mouth 2 (two) times daily. ?  ?amiodarone 200 MG tablet ?Commonly known as: PACERONE ?Take 0.5 tablets (100 mg total) by mouth daily. ?  ?ammonium lactate 12 % cream ?Commonly known as: AMLACTIN ?Apply topically as needed for dry skin. ?  ?apixaban 5 MG Tabs tablet ?Commonly known as: Eliquis ?Take 1 tablet (5 mg total) by mouth 2 (two) times daily. ?  ?atorvastatin 80 MG tablet ?Commonly known as: LIPITOR ?Take 1 tablet (80 mg  total) by mouth daily. ?  ?docusate sodium 100 MG capsule ?Commonly known as: COLACE ?Take 1 capsule (100 mg total) by mouth daily. ?  ?enalapril 10 MG tablet ?Commonly known as: VASOTEC ?Take 1 tablet (10 mg total) by mouth daily. ?  ?fexofenadine 180 MG tablet ?Commonly known  as: ALLEGRA ?Take 1 tablet (180 mg total) by mouth daily as needed for allergies or rhinitis. ?  ?fluticasone 50 MCG/ACT nasal spray ?Commonly known as: FLONASE ?Place 1 spray into both nostrils daily as needed for allergies or rhinitis. ?  ?methocarbamol 500 MG tablet ?Commonly known as: ROBAXIN ?Take 250 mg by mouth at bedtime. ?  ?metoprolol succinate 50 MG 24 hr tablet ?Commonly known as: TOPROL-XL ?Take 1 tablet (50 mg total) by mouth daily. Take with or immediately following a meal. ?  ?nitroGLYCERIN 0.4 MG SL tablet ?Commonly known as: NITROSTAT ?Place 0.4 mg under the tongue as needed for chest pain (Every 5 minutes x 3 doses). ?  ?omeprazole 20 MG tablet ?Commonly known as: PRILOSEC OTC ?Take 1 tablet (20 mg total) by mouth daily as needed. ?  ?polyethylene glycol 17 g packet ?Commonly known as: MIRALAX / GLYCOLAX ?Take 17 g by mouth daily. Give with 4 oz of water. ?  ?senna-docusate 8.6-50 MG tablet ?Commonly known as: Senokot-S ?Take 2 tablets by mouth at bedtime. ?  ?traMADol 50 MG tablet ?Commonly known as: Ultram ?Take 1 tablet (50 mg total) by mouth every 6 (six) hours as needed. ?  ?Vitamin D3 50 MCG (2000 UT) Tabs ?Take 2,000 Units by mouth daily after breakfast. ?  ?zinc oxide 20 % ointment ?Apply 1 application topically 3 (three) times daily as needed for irritation. Apply to buttocks after every incontinent episode and as needed for redness. ?  ? ?  ? ? ?Review of Systems  ?Constitutional:  Negative for activity change, appetite change and fever.  ?HENT:  Negative for congestion, hearing loss and voice change.   ?Eyes:  Negative for visual disturbance.  ?Respiratory:  Negative for cough, chest tightness and shortness of  breath.   ?Cardiovascular:  Positive for leg swelling.  ?Gastrointestinal:  Negative for abdominal pain and constipation.  ?Genitourinary:  Negative for dysuria and urgency.  ?Musculoskeletal:  Positive for ar

## 2021-04-30 ENCOUNTER — Encounter: Payer: Self-pay | Admitting: Nurse Practitioner

## 2021-04-30 DIAGNOSIS — F418 Other specified anxiety disorders: Secondary | ICD-10-CM | POA: Insufficient documentation

## 2021-04-30 DIAGNOSIS — R29898 Other symptoms and signs involving the musculoskeletal system: Secondary | ICD-10-CM | POA: Diagnosis not present

## 2021-04-30 DIAGNOSIS — R2681 Unsteadiness on feet: Secondary | ICD-10-CM | POA: Diagnosis not present

## 2021-04-30 DIAGNOSIS — I129 Hypertensive chronic kidney disease with stage 1 through stage 4 chronic kidney disease, or unspecified chronic kidney disease: Secondary | ICD-10-CM | POA: Diagnosis not present

## 2021-04-30 DIAGNOSIS — R41841 Cognitive communication deficit: Secondary | ICD-10-CM | POA: Diagnosis not present

## 2021-04-30 DIAGNOSIS — Z9181 History of falling: Secondary | ICD-10-CM | POA: Diagnosis not present

## 2021-04-30 DIAGNOSIS — M6281 Muscle weakness (generalized): Secondary | ICD-10-CM | POA: Diagnosis not present

## 2021-04-30 LAB — BASIC METABOLIC PANEL
BUN: 16 (ref 4–21)
CO2: 26 — AB (ref 13–22)
Chloride: 103 (ref 99–108)
Creatinine: 0.9 (ref 0.5–1.1)
Glucose: 90
Potassium: 4.2 mEq/L (ref 3.5–5.1)
Sodium: 140 (ref 137–147)

## 2021-04-30 LAB — COMPREHENSIVE METABOLIC PANEL: Calcium: 9.5 (ref 8.7–10.7)

## 2021-04-30 NOTE — Assessment & Plan Note (Signed)
Blood pressure is controlled, takes Enalapril, Metoprolol.  ?

## 2021-04-30 NOTE — Assessment & Plan Note (Signed)
Bun/creat 21/0.9 03/26/21 ?

## 2021-04-30 NOTE — Assessment & Plan Note (Signed)
stable, Hx of CABG Cath 04/25/19, prn NTG, takes Atorvastatin. LDL 74 04/05/20 ?

## 2021-04-30 NOTE — Assessment & Plan Note (Signed)
MMSE 27/30, functional in [place] [facility] ?

## 2021-04-30 NOTE — Assessment & Plan Note (Signed)
stable, on Senna, MiraLax.  

## 2021-04-30 NOTE — Assessment & Plan Note (Signed)
stable, takes Omeprazole, Hgb 11.9 03/26/21 ?

## 2021-04-30 NOTE — Assessment & Plan Note (Signed)
Heart rate is in control, takes Metoprolol, Eliquis, Amiodarone, f/u cardiology. TSH 1.51 09/05/20 ?

## 2021-04-30 NOTE — Assessment & Plan Note (Signed)
04/29/21 daughter reported the patient is having unpleasant side effects from Zoloft: drowsy, more difficulty with her speech, tongue seems thick. No focal neurological symptoms noted,  ?Change Robaxin to 273m qd prn,  continue Sertraline since the patient sleeps better, update CBC/diff, CMP/eGFR ?04/29/21 Na 140, K 4.2, Bun 16, creat 0.87, eGFR 65 ?

## 2021-04-30 NOTE — Assessment & Plan Note (Signed)
left hip pain, travels down to the left knee, worsens at night, improved, on Meloxicam, Methocarbamol, 04/07/21. X-ray left hip/pelvis r/o acute process.  ?MRI 11/13/20 her back showed severer spinal and foraminal stenosis with left sciatica ?

## 2021-04-30 NOTE — Assessment & Plan Note (Signed)
The patient stated she sleeps better since Sertraline started 04/16/21 ?

## 2021-04-30 NOTE — Assessment & Plan Note (Signed)
on CPAP ?

## 2021-05-01 DIAGNOSIS — R41841 Cognitive communication deficit: Secondary | ICD-10-CM | POA: Diagnosis not present

## 2021-05-01 DIAGNOSIS — R2681 Unsteadiness on feet: Secondary | ICD-10-CM | POA: Diagnosis not present

## 2021-05-01 DIAGNOSIS — R29898 Other symptoms and signs involving the musculoskeletal system: Secondary | ICD-10-CM | POA: Diagnosis not present

## 2021-05-01 DIAGNOSIS — Z9181 History of falling: Secondary | ICD-10-CM | POA: Diagnosis not present

## 2021-05-01 DIAGNOSIS — M6281 Muscle weakness (generalized): Secondary | ICD-10-CM | POA: Diagnosis not present

## 2021-05-01 DIAGNOSIS — I129 Hypertensive chronic kidney disease with stage 1 through stage 4 chronic kidney disease, or unspecified chronic kidney disease: Secondary | ICD-10-CM | POA: Diagnosis not present

## 2021-05-01 LAB — CBC AND DIFFERENTIAL
HCT: 35 — AB (ref 36–46)
Hemoglobin: 11.7 — AB (ref 12.0–16.0)
Platelets: 221 10*3/uL (ref 150–400)
WBC: 6.9

## 2021-05-01 LAB — CBC: RBC: 3.42 — AB (ref 3.87–5.11)

## 2021-05-02 ENCOUNTER — Encounter: Payer: Self-pay | Admitting: Nurse Practitioner

## 2021-05-02 ENCOUNTER — Non-Acute Institutional Stay: Payer: Medicare Other | Admitting: Nurse Practitioner

## 2021-05-02 DIAGNOSIS — G459 Transient cerebral ischemic attack, unspecified: Secondary | ICD-10-CM | POA: Diagnosis not present

## 2021-05-02 DIAGNOSIS — Z9181 History of falling: Secondary | ICD-10-CM | POA: Diagnosis not present

## 2021-05-02 DIAGNOSIS — F5105 Insomnia due to other mental disorder: Secondary | ICD-10-CM | POA: Diagnosis not present

## 2021-05-02 DIAGNOSIS — G4733 Obstructive sleep apnea (adult) (pediatric): Secondary | ICD-10-CM

## 2021-05-02 DIAGNOSIS — I480 Paroxysmal atrial fibrillation: Secondary | ICD-10-CM

## 2021-05-02 DIAGNOSIS — K219 Gastro-esophageal reflux disease without esophagitis: Secondary | ICD-10-CM | POA: Diagnosis not present

## 2021-05-02 DIAGNOSIS — R29898 Other symptoms and signs involving the musculoskeletal system: Secondary | ICD-10-CM | POA: Diagnosis not present

## 2021-05-02 DIAGNOSIS — M159 Polyosteoarthritis, unspecified: Secondary | ICD-10-CM | POA: Diagnosis not present

## 2021-05-02 DIAGNOSIS — N183 Chronic kidney disease, stage 3 unspecified: Secondary | ICD-10-CM

## 2021-05-02 DIAGNOSIS — Z9989 Dependence on other enabling machines and devices: Secondary | ICD-10-CM

## 2021-05-02 DIAGNOSIS — K5901 Slow transit constipation: Secondary | ICD-10-CM

## 2021-05-02 DIAGNOSIS — I25709 Atherosclerosis of coronary artery bypass graft(s), unspecified, with unspecified angina pectoris: Secondary | ICD-10-CM

## 2021-05-02 DIAGNOSIS — R2681 Unsteadiness on feet: Secondary | ICD-10-CM | POA: Diagnosis not present

## 2021-05-02 DIAGNOSIS — I10 Essential (primary) hypertension: Secondary | ICD-10-CM | POA: Diagnosis not present

## 2021-05-02 DIAGNOSIS — F418 Other specified anxiety disorders: Secondary | ICD-10-CM

## 2021-05-02 DIAGNOSIS — R400 Somnolence: Secondary | ICD-10-CM | POA: Diagnosis not present

## 2021-05-02 DIAGNOSIS — R41841 Cognitive communication deficit: Secondary | ICD-10-CM | POA: Diagnosis not present

## 2021-05-02 DIAGNOSIS — I129 Hypertensive chronic kidney disease with stage 1 through stage 4 chronic kidney disease, or unspecified chronic kidney disease: Secondary | ICD-10-CM | POA: Diagnosis not present

## 2021-05-02 DIAGNOSIS — R413 Other amnesia: Secondary | ICD-10-CM | POA: Diagnosis not present

## 2021-05-02 DIAGNOSIS — M6281 Muscle weakness (generalized): Secondary | ICD-10-CM | POA: Diagnosis not present

## 2021-05-02 DIAGNOSIS — I25119 Atherosclerotic heart disease of native coronary artery with unspecified angina pectoris: Secondary | ICD-10-CM

## 2021-05-02 NOTE — Progress Notes (Signed)
?Location:  [facility] ?Nursing Home Room Number: [id number]/A ?Place of Service:  ALF (13) ?Provider:  Chancelor Hardrick X, NP ? ?Shanitha Twining X, NP ? ?Patient Care Team: ?Taegan Standage X, NP as PCP - General (Internal Medicine) ?Leonie Caydn Justen, MD as PCP - Cardiology (Cardiology) ?Idalee Foxworthy X, NP as Nurse Practitioner (Internal Medicine) ?Lavonna Monarch, MD as Consulting Physician (Dermatology) ? ?Extended Emergency Contact Information ?Primary Emergency Contact: Johnson,Carrie ?Mobile Phone: [phone] ?Relation: Daughter ?Secondary Emergency Contact: Weeks,Tiffany ?Mobile Phone: [phone] ?Relation: Daughter ?Preferred language: English ?Interpreter needed? No ? ?Code Status:  FULL ?Goals of care: Advanced Directive information ? ?  05/02/2021  ? 10:13 AM  ?Advanced Directives  ?Does Patient Have a Medical Advance Directive? Yes  ?Type of Paramedic of [place];Out of facility DNR (pink MOST or yellow form)  ?Does patient want to make changes to medical advance directive? No - Patient declined  ?Copy of [place] in Chart? Yes - validated most recent copy scanned in chart (See row information)  ?Pre-existing out of facility DNR order (yellow form or pink MOST form) Yellow form placed in chart (order not valid for inpatient use);Pink MOST form placed in chart (order not valid for inpatient use)  ? ? ? ?Chief Complaint  ?Patient presents with  ? Acute Visit  ?  Patient is having HPOA concerns  ? ? ?HPI:  ?Pt is a 88 y.o. female seen today for an acute visit for f/u reported lisp episode Monday 04/28/21. No further lisp seen Tuesday 04/29/21 and again today. No noted focal weakness. The patient stated she sleeps better but still woke up early, sometimes has difficulty returning to sleep. Denied pain in back/hip, on Robaxin prn since 04/29/21. The patient stated she is getting stronger, uses walker to ambulate more than prior.  ? The left hip pain, travels down to the left knee,  worsens at night, improved, on Meloxicam, Methocarbamol prn,  04/07/21. X-ray left hip/pelvis r/o acute process. ?OA, left knee s/p TKR, left hip pain, MR lumbar, severe spinal 11/13/20 spinal and foraminal stenosis with left sciatica.  ?            Constipation, stable, on Senna, MiraLax.  ?            Afib, takes Metoprolol, Eliquis, Amiodarone, f/u cardiology. TSH 1.51 09/05/20 ?            HTN, takes Enalapril, Metoprolol.  ?            GERD, stable, takes Omeprazole, Hgb 11.7 05/01/21 ?            CAD, stable, Hx of CABG Cath 04/25/19, prn NTG, takes Atorvastatin. LDL 74 3/25/2 ?            CKD Bun/creat 16/0.87 05/01/21 ?            OSA, on CPAP ?            Cognitive impairment, MMSE 27/30, AL for supportive care.  ?            BLE edema, mild.  ?  ? ? ?Past Medical History:  ?Diagnosis Date  ? Atherosclerosis of native coronary artery 2001  ? LAD & Cx disease -- BMS PCI in 2001; CABG 2002.  PCI in 2006 (per-op TAH)  ? Atrial fibrillation and flutter ([place]) 04/2019  ? New diagnosis-major complaint was chest tightness and dyspnea.  ? Breast cancer ([place])   ? Cervix dysplasia   ?  GERD (gastroesophageal reflux disease)   ? Heart attack ([place]) 2002, 2006  ? Both events reportedly were perioperatively. She has no recollection. Initial PCI was related to angina symptoms (bilateral arm pain)  ? High cholesterol   ? Hypertension   ? Lumbosacral spinal stenosis   ? Osteoarthritis   ? Skin cancer   ? Sleep apnea   ? Uses CPAP faithfully  ? Squamous cell carcinoma of skin 02/12/2020  ? in situ- left neck-anterior (CX35FU)  ? ?Past Surgical History:  ?Procedure Laterality Date  ? ABDOMINAL HYSTERECTOMY    ? bone density    ? CARDIAC CATHETERIZATION  2001  ?  Dr. Glade Lloyd [facility]) 2 vessel CAD involving LAD-D1 and distal Cx-OM. Appearance of dilated tapered left main with no significant atherosclerosis on IVUS --  BMS PCI of pLAD (Royale BMS 3.0 mm x 15 mm) & with PTCA of ostial D1, PTCA of dLAD.  BMS PCI pCx [facility] BMS 3.5  mm x 11 mm) - unable to reopen dCx-OM (probable distal Atheroembolic occlusion.  Normal LVEF.  ? COLONOSCOPY    ? CORONARY ARTERY BYPASS GRAFT  2002  ? Unsure of details [facility], in [facility])  ? CORONARY STENT INTERVENTION  2001  ? ([place]) Royale BMS PCI pLAD (3.0 mm x 15 mm) -PTCA of jailed D1 as well as distal LAD; pCx (Royale BMS 3.5 mm x 11 mm) -unable to open distalCx-OM -thromboembolic occlusion  ? DIAGNOSTIC MAMMOGRAM    ? LEFT HEART CATH AND CORS/GRAFTS ANGIOGRAPHY N/A 04/25/2019  ? Procedure: LEFT HEART CATH AND CORS/GRAFTS ANGIOGRAPHY;  Surgeon: Troy Sine, MD;  [facility];;; ost-prox LCx stent @ OM1 ostium =100% CTO, Ost-prox LAD stent ~30% ISR, Ost D1 CTO, prox RCA ~30%. SCG-OM2 patent with ostial stent 30%, LIMA-LAD patent; CTO of SVG-D1.   ? left knee replacement    ? MASTECTOMY    ? NM MYOVIEW LTD  12/2014; 2018  ? Cardiologist (Dr. Kellie Simmering.  [place], [place] ph# [phone]) -->LEXISCAN: Nonischemic, "normal "  ? pap smear    ? tonsilectomy    ? TRANSTHORACIC ECHOCARDIOGRAM  12/2014  ? Echocardiogram 12/25/14- normal LVEF and systolic function, EF 96-22%, mild grade 1 diastolic dysfunction  ? TRANSTHORACIC ECHOCARDIOGRAM  04/24/2019  ? EF 65 to 70%.  Moderate septal LVH.  GR 1 DD-elevated EDP.  Normal PAP.  Relatively normal valves.  No aortic stenosis.  ? ? ?Allergies  ?Allergen Reactions  ? Codeine Nausea Only  ? ? ?Outpatient Encounter Medications as of 05/02/2021  ?Medication Sig  ? acetaminophen (TYLENOL) 500 MG tablet Take 2 tablets (1,000 mg total) by mouth 2 (two) times daily.  ? amiodarone (PACERONE) 200 MG tablet Take 0.5 tablets (100 mg total) by mouth daily.  ? ammonium lactate (AMLACTIN) 12 % cream Apply topically as needed for dry skin.  ? apixaban (ELIQUIS) 5 MG TABS tablet Take 1 tablet (5 mg total) by mouth 2 (two) times daily.  ? atorvastatin (LIPITOR) 80 MG tablet Take 1 tablet (80 mg total) by mouth daily.  ? Cholecalciferol (VITAMIN D3) 50 MCG (2000 UT)  TABS Take 2,000 Units by mouth daily after breakfast.  ? docusate sodium (COLACE) 100 MG capsule Take 1 capsule (100 mg total) by mouth daily.  ? enalapril (VASOTEC) 10 MG tablet Take 1 tablet (10 mg total) by mouth daily.  ? fexofenadine (ALLEGRA) 180 MG tablet Take 1 tablet (180 mg total) by mouth daily as needed for allergies or rhinitis.  ?  fluticasone (FLONASE) 50 MCG/ACT nasal spray Place 1 spray into both nostrils daily as needed for allergies or rhinitis.  ? methocarbamol (ROBAXIN) 500 MG tablet Take 250 mg by mouth at bedtime.  ? metoprolol succinate (TOPROL-XL) 50 MG 24 hr tablet Take 1 tablet (50 mg total) by mouth daily. Take with or immediately following a meal.  ? nitroGLYCERIN (NITROSTAT) 0.4 MG SL tablet Place 0.4 mg under the tongue as needed for chest pain (Every 5 minutes x 3 doses).  ? omeprazole (PRILOSEC OTC) 20 MG tablet Take 1 tablet (20 mg total) by mouth daily as needed.  ? polyethylene glycol (MIRALAX / GLYCOLAX) 17 g packet Take 17 g by mouth daily. Give with 4 oz of water.  ? senna-docusate (SENOKOT-S) 8.6-50 MG tablet Take 2 tablets by mouth at bedtime.  ? traMADol (ULTRAM) 50 MG tablet Take 1 tablet (50 mg total) by mouth every 6 (six) hours as needed.  ? zinc oxide 20 % ointment Apply 1 application topically 3 (three) times daily as needed for irritation. Apply to buttocks after every incontinent episode and as needed for redness.  ? ?No facility-administered encounter medications on file as of 05/02/2021.  ? ? ?Review of Systems  ?Constitutional:  Negative for activity change, appetite change and fever.  ?HENT:  Negative for congestion, hearing loss and voice change.   ?Eyes:  Negative for visual disturbance.  ?Respiratory:  Negative for chest tightness and shortness of breath.   ?Cardiovascular:  Positive for leg swelling.  ?Gastrointestinal:  Negative for abdominal pain and constipation.  ?Genitourinary:  Negative for dysuria and urgency.  ?Musculoskeletal:  Positive for  arthralgias, back pain and gait problem.  ?     S/p L TKR,  left hip pain is better  ?Skin:  Negative for color change.  ?      ?  ?Neurological:  Negative for speech difficulty and headaches.  ?     Memory lap

## 2021-05-02 NOTE — Assessment & Plan Note (Signed)
stable, takes Omeprazole, Hgb 11.7 05/01/21 ?

## 2021-05-02 NOTE — Progress Notes (Signed)
This encounter was created in error - please disregard.

## 2021-05-02 NOTE — Assessment & Plan Note (Signed)
Blood pressure is controlled, takes Enalapril, Metoprolol.  ?

## 2021-05-02 NOTE — Assessment & Plan Note (Signed)
MMSE 27/30, AL for supportive care.  ?

## 2021-05-02 NOTE — Assessment & Plan Note (Signed)
The patient stated she sleeps better but still woke up early, sometimes has difficulty returning to sleep ?

## 2021-05-02 NOTE — Assessment & Plan Note (Addendum)
Improved after Robaxin changed to prn, 04/29/21 Na 140, K 4.2, Bun 16, creat 0.87, eGFR 65 ?05/01/21 wbc 6.9, Hgb 11.7, plt 221, neutrophils 57.3 ?

## 2021-05-02 NOTE — Assessment & Plan Note (Signed)
on CPAP ?

## 2021-05-02 NOTE — Assessment & Plan Note (Signed)
Bun/creat 16/0.87 05/01/21 ?

## 2021-05-02 NOTE — Assessment & Plan Note (Signed)
Heart rate is in control, takes Metoprolol, Eliquis, Amiodarone, f/u cardiology. TSH 1.51 09/05/20 ?

## 2021-05-02 NOTE — Assessment & Plan Note (Signed)
stable, on Senna, MiraLax.  

## 2021-05-02 NOTE — Assessment & Plan Note (Signed)
?   TIA, on Eliquis already, reported lisp episode Monday 04/28/21. No further lisp seen Tuesday 04/29/21 and again today. No noted focal weakness. May consider CT head if recurs. Will have ST to eval/tx ?

## 2021-05-02 NOTE — Assessment & Plan Note (Signed)
Lower back/hip, pain is well controlled,  Robaxin prn since 04/29/21. The patient stated she is getting stronger, uses walker to ambulate more than prior, on Meloxicam, Methocarbamol prn,  04/07/21. X-ray left hip/pelvis r/o acute process. ?OA, left knee s/p TKR, left hip pain, MR lumbar, severe spinal 11/13/20 spinal and foraminal stenosis with left sciatica ?

## 2021-05-02 NOTE — Assessment & Plan Note (Signed)
stable, Hx of CABG Cath 04/25/19, prn NTG, takes Atorvastatin. LDL 74 3/25/2 

## 2021-05-05 ENCOUNTER — Ambulatory Visit: Payer: Medicare Other | Admitting: Dermatology

## 2021-05-05 DIAGNOSIS — M6281 Muscle weakness (generalized): Secondary | ICD-10-CM | POA: Diagnosis not present

## 2021-05-05 DIAGNOSIS — R41841 Cognitive communication deficit: Secondary | ICD-10-CM | POA: Diagnosis not present

## 2021-05-05 DIAGNOSIS — R2681 Unsteadiness on feet: Secondary | ICD-10-CM | POA: Diagnosis not present

## 2021-05-05 DIAGNOSIS — I129 Hypertensive chronic kidney disease with stage 1 through stage 4 chronic kidney disease, or unspecified chronic kidney disease: Secondary | ICD-10-CM | POA: Diagnosis not present

## 2021-05-05 DIAGNOSIS — Z9181 History of falling: Secondary | ICD-10-CM | POA: Diagnosis not present

## 2021-05-05 DIAGNOSIS — R29898 Other symptoms and signs involving the musculoskeletal system: Secondary | ICD-10-CM | POA: Diagnosis not present

## 2021-05-06 DIAGNOSIS — R41841 Cognitive communication deficit: Secondary | ICD-10-CM | POA: Diagnosis not present

## 2021-05-06 DIAGNOSIS — L84 Corns and callosities: Secondary | ICD-10-CM | POA: Diagnosis not present

## 2021-05-06 DIAGNOSIS — Z9181 History of falling: Secondary | ICD-10-CM | POA: Diagnosis not present

## 2021-05-06 DIAGNOSIS — M6281 Muscle weakness (generalized): Secondary | ICD-10-CM | POA: Diagnosis not present

## 2021-05-06 DIAGNOSIS — I129 Hypertensive chronic kidney disease with stage 1 through stage 4 chronic kidney disease, or unspecified chronic kidney disease: Secondary | ICD-10-CM | POA: Diagnosis not present

## 2021-05-06 DIAGNOSIS — R29898 Other symptoms and signs involving the musculoskeletal system: Secondary | ICD-10-CM | POA: Diagnosis not present

## 2021-05-06 DIAGNOSIS — R2681 Unsteadiness on feet: Secondary | ICD-10-CM | POA: Diagnosis not present

## 2021-05-06 DIAGNOSIS — B351 Tinea unguium: Secondary | ICD-10-CM | POA: Diagnosis not present

## 2021-05-06 DIAGNOSIS — E1159 Type 2 diabetes mellitus with other circulatory complications: Secondary | ICD-10-CM | POA: Diagnosis not present

## 2021-05-08 DIAGNOSIS — I129 Hypertensive chronic kidney disease with stage 1 through stage 4 chronic kidney disease, or unspecified chronic kidney disease: Secondary | ICD-10-CM | POA: Diagnosis not present

## 2021-05-08 DIAGNOSIS — Z9181 History of falling: Secondary | ICD-10-CM | POA: Diagnosis not present

## 2021-05-08 DIAGNOSIS — R41841 Cognitive communication deficit: Secondary | ICD-10-CM | POA: Diagnosis not present

## 2021-05-08 DIAGNOSIS — M6281 Muscle weakness (generalized): Secondary | ICD-10-CM | POA: Diagnosis not present

## 2021-05-08 DIAGNOSIS — R2681 Unsteadiness on feet: Secondary | ICD-10-CM | POA: Diagnosis not present

## 2021-05-08 DIAGNOSIS — R29898 Other symptoms and signs involving the musculoskeletal system: Secondary | ICD-10-CM | POA: Diagnosis not present

## 2021-05-12 DIAGNOSIS — G4733 Obstructive sleep apnea (adult) (pediatric): Secondary | ICD-10-CM | POA: Diagnosis not present

## 2021-05-13 DIAGNOSIS — R41841 Cognitive communication deficit: Secondary | ICD-10-CM | POA: Diagnosis not present

## 2021-05-13 DIAGNOSIS — R29898 Other symptoms and signs involving the musculoskeletal system: Secondary | ICD-10-CM | POA: Diagnosis not present

## 2021-05-13 DIAGNOSIS — M6281 Muscle weakness (generalized): Secondary | ICD-10-CM | POA: Diagnosis not present

## 2021-05-13 DIAGNOSIS — Z9181 History of falling: Secondary | ICD-10-CM | POA: Diagnosis not present

## 2021-05-13 DIAGNOSIS — I129 Hypertensive chronic kidney disease with stage 1 through stage 4 chronic kidney disease, or unspecified chronic kidney disease: Secondary | ICD-10-CM | POA: Diagnosis not present

## 2021-05-13 DIAGNOSIS — R531 Weakness: Secondary | ICD-10-CM | POA: Diagnosis not present

## 2021-05-13 DIAGNOSIS — R2681 Unsteadiness on feet: Secondary | ICD-10-CM | POA: Diagnosis not present

## 2021-05-14 DIAGNOSIS — R2681 Unsteadiness on feet: Secondary | ICD-10-CM | POA: Diagnosis not present

## 2021-05-14 DIAGNOSIS — I129 Hypertensive chronic kidney disease with stage 1 through stage 4 chronic kidney disease, or unspecified chronic kidney disease: Secondary | ICD-10-CM | POA: Diagnosis not present

## 2021-05-14 DIAGNOSIS — M6281 Muscle weakness (generalized): Secondary | ICD-10-CM | POA: Diagnosis not present

## 2021-05-14 DIAGNOSIS — R41841 Cognitive communication deficit: Secondary | ICD-10-CM | POA: Diagnosis not present

## 2021-05-14 DIAGNOSIS — R29898 Other symptoms and signs involving the musculoskeletal system: Secondary | ICD-10-CM | POA: Diagnosis not present

## 2021-05-14 DIAGNOSIS — Z9181 History of falling: Secondary | ICD-10-CM | POA: Diagnosis not present

## 2021-05-15 DIAGNOSIS — R2681 Unsteadiness on feet: Secondary | ICD-10-CM | POA: Diagnosis not present

## 2021-05-15 DIAGNOSIS — I129 Hypertensive chronic kidney disease with stage 1 through stage 4 chronic kidney disease, or unspecified chronic kidney disease: Secondary | ICD-10-CM | POA: Diagnosis not present

## 2021-05-15 DIAGNOSIS — Z9181 History of falling: Secondary | ICD-10-CM | POA: Diagnosis not present

## 2021-05-15 DIAGNOSIS — M6281 Muscle weakness (generalized): Secondary | ICD-10-CM | POA: Diagnosis not present

## 2021-05-15 DIAGNOSIS — R41841 Cognitive communication deficit: Secondary | ICD-10-CM | POA: Diagnosis not present

## 2021-05-15 DIAGNOSIS — R29898 Other symptoms and signs involving the musculoskeletal system: Secondary | ICD-10-CM | POA: Diagnosis not present

## 2021-05-16 DIAGNOSIS — M6281 Muscle weakness (generalized): Secondary | ICD-10-CM | POA: Diagnosis not present

## 2021-05-16 DIAGNOSIS — Z9181 History of falling: Secondary | ICD-10-CM | POA: Diagnosis not present

## 2021-05-16 DIAGNOSIS — R2681 Unsteadiness on feet: Secondary | ICD-10-CM | POA: Diagnosis not present

## 2021-05-16 DIAGNOSIS — I129 Hypertensive chronic kidney disease with stage 1 through stage 4 chronic kidney disease, or unspecified chronic kidney disease: Secondary | ICD-10-CM | POA: Diagnosis not present

## 2021-05-16 DIAGNOSIS — R41841 Cognitive communication deficit: Secondary | ICD-10-CM | POA: Diagnosis not present

## 2021-05-16 DIAGNOSIS — R29898 Other symptoms and signs involving the musculoskeletal system: Secondary | ICD-10-CM | POA: Diagnosis not present

## 2021-05-19 DIAGNOSIS — Z9181 History of falling: Secondary | ICD-10-CM | POA: Diagnosis not present

## 2021-05-19 DIAGNOSIS — R2681 Unsteadiness on feet: Secondary | ICD-10-CM | POA: Diagnosis not present

## 2021-05-19 DIAGNOSIS — I129 Hypertensive chronic kidney disease with stage 1 through stage 4 chronic kidney disease, or unspecified chronic kidney disease: Secondary | ICD-10-CM | POA: Diagnosis not present

## 2021-05-19 DIAGNOSIS — M6281 Muscle weakness (generalized): Secondary | ICD-10-CM | POA: Diagnosis not present

## 2021-05-19 DIAGNOSIS — R41841 Cognitive communication deficit: Secondary | ICD-10-CM | POA: Diagnosis not present

## 2021-05-19 DIAGNOSIS — R29898 Other symptoms and signs involving the musculoskeletal system: Secondary | ICD-10-CM | POA: Diagnosis not present

## 2021-05-20 DIAGNOSIS — Z9181 History of falling: Secondary | ICD-10-CM | POA: Diagnosis not present

## 2021-05-20 DIAGNOSIS — R41841 Cognitive communication deficit: Secondary | ICD-10-CM | POA: Diagnosis not present

## 2021-05-20 DIAGNOSIS — M6281 Muscle weakness (generalized): Secondary | ICD-10-CM | POA: Diagnosis not present

## 2021-05-20 DIAGNOSIS — I129 Hypertensive chronic kidney disease with stage 1 through stage 4 chronic kidney disease, or unspecified chronic kidney disease: Secondary | ICD-10-CM | POA: Diagnosis not present

## 2021-05-20 DIAGNOSIS — R2681 Unsteadiness on feet: Secondary | ICD-10-CM | POA: Diagnosis not present

## 2021-05-20 DIAGNOSIS — R29898 Other symptoms and signs involving the musculoskeletal system: Secondary | ICD-10-CM | POA: Diagnosis not present

## 2021-05-21 ENCOUNTER — Non-Acute Institutional Stay: Payer: Medicare Other | Admitting: Internal Medicine

## 2021-05-21 VITALS — BP 122/64 | HR 62 | Temp 97.7°F | Ht 67.0 in | Wt 188.8 lb

## 2021-05-21 DIAGNOSIS — I129 Hypertensive chronic kidney disease with stage 1 through stage 4 chronic kidney disease, or unspecified chronic kidney disease: Secondary | ICD-10-CM | POA: Diagnosis not present

## 2021-05-21 DIAGNOSIS — R2681 Unsteadiness on feet: Secondary | ICD-10-CM | POA: Diagnosis not present

## 2021-05-21 DIAGNOSIS — R471 Dysarthria and anarthria: Secondary | ICD-10-CM | POA: Diagnosis not present

## 2021-05-21 DIAGNOSIS — R41841 Cognitive communication deficit: Secondary | ICD-10-CM | POA: Diagnosis not present

## 2021-05-21 DIAGNOSIS — N183 Chronic kidney disease, stage 3 unspecified: Secondary | ICD-10-CM

## 2021-05-21 DIAGNOSIS — I10 Essential (primary) hypertension: Secondary | ICD-10-CM

## 2021-05-21 DIAGNOSIS — G3184 Mild cognitive impairment, so stated: Secondary | ICD-10-CM | POA: Diagnosis not present

## 2021-05-21 DIAGNOSIS — Z9181 History of falling: Secondary | ICD-10-CM | POA: Diagnosis not present

## 2021-05-21 DIAGNOSIS — I480 Paroxysmal atrial fibrillation: Secondary | ICD-10-CM

## 2021-05-21 DIAGNOSIS — I25709 Atherosclerosis of coronary artery bypass graft(s), unspecified, with unspecified angina pectoris: Secondary | ICD-10-CM | POA: Diagnosis not present

## 2021-05-21 DIAGNOSIS — M6281 Muscle weakness (generalized): Secondary | ICD-10-CM | POA: Diagnosis not present

## 2021-05-21 DIAGNOSIS — R29898 Other symptoms and signs involving the musculoskeletal system: Secondary | ICD-10-CM | POA: Diagnosis not present

## 2021-05-22 DIAGNOSIS — R2681 Unsteadiness on feet: Secondary | ICD-10-CM | POA: Diagnosis not present

## 2021-05-22 DIAGNOSIS — R41841 Cognitive communication deficit: Secondary | ICD-10-CM | POA: Diagnosis not present

## 2021-05-22 DIAGNOSIS — I129 Hypertensive chronic kidney disease with stage 1 through stage 4 chronic kidney disease, or unspecified chronic kidney disease: Secondary | ICD-10-CM | POA: Diagnosis not present

## 2021-05-22 DIAGNOSIS — R29898 Other symptoms and signs involving the musculoskeletal system: Secondary | ICD-10-CM | POA: Diagnosis not present

## 2021-05-22 DIAGNOSIS — M6281 Muscle weakness (generalized): Secondary | ICD-10-CM | POA: Diagnosis not present

## 2021-05-22 DIAGNOSIS — Z9181 History of falling: Secondary | ICD-10-CM | POA: Diagnosis not present

## 2021-05-22 NOTE — Progress Notes (Signed)
? ?Location: [facility] ?  ?Place of Service:  Clinic (12) ? ?Provider:  ? ?Code Status: full code ?Goals of Care:  ? ?  05/02/2021  ? 10:13 AM  ?Advanced Directives  ?Does Patient Have a Medical Advance Directive? Yes  ?Type of Paramedic of [place];Out of facility DNR (pink MOST or yellow form)  ?Does patient want to make changes to medical advance directive? No - Patient declined  ?Copy of [place] in Chart? Yes - validated most recent copy scanned in chart (See row information)  ?Pre-existing out of facility DNR order (yellow form or pink MOST form) Yellow form placed in chart (order not valid for inpatient use);Pink MOST form placed in chart (order not valid for inpatient use)  ? ? ? ?Chief Complaint  ?Patient presents with  ? Acute Visit  ?  Patient returns to the clinic due to some slurred speech.   ? ? ?HPI: Patient is a 88 y.o. female seen today for an acute visit for episode of Slurred speech ?a nd Dysarthria since then ? ?Lives in [place] ?Patient has a history of CAD s/p CABG in 2002 S/p cath in 04/21 which showed occluded SVG to Diag but patent LIMA to LAD and SVG to CX.EF. On Medical Management ?EF 65% ?PAF diagnosed in 4/21 on Eliquis ?Sleep Apnea uses CPAP ?H/I Skin Cancer ?H/o Breast Cancer s/p Mastectomy  ?HTN,HLD and Arthritis ?Cognitive impairment ?LE Edema ?Patient Had Right Knee pain after fall in 11/22 ?Did therapy. ?Also had Spinal Injection in 01/23 ? ? ?Patient came to see with her Daughter and son as they have noticed Slurred speech few weeks ago ?At that time she was seen by [facility] and was not noticed to have any issue with her speech ?But the family is concerned that patient has had a TIA and is at risk of having another stroke. ?Daughter had also noticed that patient had some swelling in her tongue.  It has resolved since then. ?Physically patient has made s progress working with therapy.  Is able to now do her transfers her ADLs and able to  walk with her walker.  Uses the wheelchair for long distance.  Patient has mild cognitive impairment but mostly alert and oriented. ?Her back pain knee pain is now much better. ?They do not have any other acute complaints ? ? ?  ?Past Medical History:  ?Diagnosis Date  ? Atherosclerosis of native coronary artery 2001  ? LAD & Cx disease -- BMS PCI in 2001; CABG 2002.  PCI in 2006 (per-op TAH)  ? Atrial fibrillation and flutter ([place]) 04/2019  ? New diagnosis-major complaint was chest tightness and dyspnea.  ? Breast cancer ([place])   ? Cervix dysplasia   ? GERD (gastroesophageal reflux disease)   ? Heart attack ([place]) 2002, 2006  ? Both events reportedly were perioperatively. She has no recollection. Initial PCI was related to angina symptoms (bilateral arm pain)  ? High cholesterol   ? Hypertension   ? Lumbosacral spinal stenosis   ? Osteoarthritis   ? Skin cancer   ? Sleep apnea   ? Uses CPAP faithfully  ? Squamous cell carcinoma of skin 02/12/2020  ? in situ- left neck-anterior (CX35FU)  ? ? ?Past Surgical History:  ?Procedure Laterality Date  ? ABDOMINAL HYSTERECTOMY    ? bone density    ? CARDIAC CATHETERIZATION  2001  ?  Dr. Glade Lloyd [facility]) 2 vessel CAD involving LAD-D1 and distal Cx-OM. Appearance  of dilated tapered left main with no significant atherosclerosis on IVUS --  BMS PCI of pLAD (Royale BMS 3.0 mm x 15 mm) & with PTCA of ostial D1, PTCA of dLAD.  BMS PCI pCx [facility] BMS 3.5 mm x 11 mm) - unable to reopen dCx-OM (probable distal Atheroembolic occlusion.  Normal LVEF.  ? COLONOSCOPY    ? CORONARY ARTERY BYPASS GRAFT  2002  ? Unsure of details [facility], in [facility])  ? CORONARY STENT INTERVENTION  2001  ? ([place]) Royale BMS PCI pLAD (3.0 mm x 15 mm) -PTCA of jailed D1 as well as distal LAD; pCx (Royale BMS 3.5 mm x 11 mm) -unable to open distalCx-OM -thromboembolic occlusion  ? DIAGNOSTIC MAMMOGRAM    ? LEFT HEART CATH AND CORS/GRAFTS ANGIOGRAPHY N/A 04/25/2019  ? Procedure: LEFT HEART CATH AND  CORS/GRAFTS ANGIOGRAPHY;  Surgeon: Troy Sine, MD;  [facility];;; ost-prox LCx stent @ OM1 ostium =100% CTO, Ost-prox LAD stent ~30% ISR, Ost D1 CTO, prox RCA ~30%. SCG-OM2 patent with ostial stent 30%, LIMA-LAD patent; CTO of SVG-D1.   ? left knee replacement    ? MASTECTOMY    ? NM MYOVIEW LTD  12/2014; 2018  ? Cardiologist (Dr. Kellie Simmering.  [place], [place] ph# [phone]) -->LEXISCAN: Nonischemic, "normal "  ? pap smear    ? tonsilectomy    ? TRANSTHORACIC ECHOCARDIOGRAM  12/2014  ? Echocardiogram 12/25/14- normal LVEF and systolic function, EF 31-51%, mild grade 1 diastolic dysfunction  ? TRANSTHORACIC ECHOCARDIOGRAM  04/24/2019  ? EF 65 to 70%.  Moderate septal LVH.  GR 1 DD-elevated EDP.  Normal PAP.  Relatively normal valves.  No aortic stenosis.  ? ? ?Allergies  ?Allergen Reactions  ? Codeine Nausea Only  ? ? ?Outpatient Encounter Medications as of 05/21/2021  ?Medication Sig  ? acetaminophen (TYLENOL) 500 MG tablet Take 2 tablets (1,000 mg total) by mouth 2 (two) times daily.  ? amiodarone (PACERONE) 200 MG tablet Take 0.5 tablets (100 mg total) by mouth daily.  ? ammonium lactate (AMLACTIN) 12 % cream Apply topically as needed for dry skin.  ? apixaban (ELIQUIS) 5 MG TABS tablet Take 1 tablet (5 mg total) by mouth 2 (two) times daily.  ? atorvastatin (LIPITOR) 80 MG tablet Take 1 tablet (80 mg total) by mouth daily.  ? Cholecalciferol (VITAMIN D3) 50 MCG (2000 UT) TABS Take 2,000 Units by mouth daily after breakfast.  ? docusate sodium (COLACE) 100 MG capsule Take 1 capsule (100 mg total) by mouth daily.  ? enalapril (VASOTEC) 10 MG tablet Take 1 tablet (10 mg total) by mouth daily.  ? fexofenadine (ALLEGRA) 180 MG tablet Take 1 tablet (180 mg total) by mouth daily as needed for allergies or rhinitis.  ? fluticasone (FLONASE) 50 MCG/ACT nasal spray Place 1 spray into both nostrils daily as needed for allergies or rhinitis.  ? methocarbamol (ROBAXIN) 500 MG tablet Take 250 mg by mouth at  bedtime.  ? metoprolol succinate (TOPROL-XL) 50 MG 24 hr tablet Take 1 tablet (50 mg total) by mouth daily. Take with or immediately following a meal.  ? nitroGLYCERIN (NITROSTAT) 0.4 MG SL tablet Place 0.4 mg under the tongue as needed for chest pain (Every 5 minutes x 3 doses).  ? omeprazole (PRILOSEC OTC) 20 MG tablet Take 1 tablet (20 mg total) by mouth daily as needed.  ? polyethylene glycol (MIRALAX / GLYCOLAX) 17 g packet Take 17 g by mouth daily. Give with 4 oz of  water.  ? senna-docusate (SENOKOT-S) 8.6-50 MG tablet Take 2 tablets by mouth at bedtime.  ? traMADol (ULTRAM) 50 MG tablet Take 1 tablet (50 mg total) by mouth every 6 (six) hours as needed.  ? zinc oxide 20 % ointment Apply 1 application topically 3 (three) times daily as needed for irritation. Apply to buttocks after every incontinent episode and as needed for redness.  ? ?No facility-administered encounter medications on file as of 05/21/2021.  ? ? ?Review of Systems:  ?Review of Systems  ?Constitutional:  Negative for activity change and appetite change.  ?HENT: Negative.    ?Respiratory:  Negative for cough and shortness of breath.   ?Cardiovascular:  Negative for leg swelling.  ?Gastrointestinal:  Negative for constipation.  ?Genitourinary: Negative.   ?Musculoskeletal:  Positive for gait problem. Negative for arthralgias and myalgias.  ?Skin: Negative.   ?Neurological:  Positive for weakness. Negative for dizziness.  ?Psychiatric/Behavioral:  Positive for confusion. Negative for dysphoric mood and sleep disturbance.   ? ?Health Maintenance  ?Topic Date Due  ? Pneumonia Vaccine 26+ Years old (1 - PCV) 10/27/2000  ? HEMOGLOBIN A1C  12/01/2019  ? OPHTHALMOLOGY EXAM  05/30/2021  ? INFLUENZA VACCINE  08/12/2021  ? FOOT EXAM  10/17/2021  ? TETANUS/TDAP  12/02/2027  ? DEXA SCAN  Completed  ? Zoster Vaccines- Shingrix  Completed  ? HPV VACCINES  Aged Out  ? COVID-19 Vaccine  Discontinued  ? ? ?Physical Exam: ?Vitals:  ? 05/21/21 1546  ?BP: 122/64   ?Pulse: 62  ?Temp: 97.7 ?F (36.5 ?C)  ?SpO2: 94%  ?Weight: 188 lb 12.8 oz (85.6 kg)  ?Height: '5\' 7"'$  (1.702 m)  ? ?Body mass index is 29.57 kg/m?Marland Kitchen ?Physical Exam ?Vitals reviewed.  ?Constitutional:   ?

## 2021-05-23 DIAGNOSIS — Z9181 History of falling: Secondary | ICD-10-CM | POA: Diagnosis not present

## 2021-05-23 DIAGNOSIS — R41841 Cognitive communication deficit: Secondary | ICD-10-CM | POA: Diagnosis not present

## 2021-05-23 DIAGNOSIS — M6281 Muscle weakness (generalized): Secondary | ICD-10-CM | POA: Diagnosis not present

## 2021-05-23 DIAGNOSIS — I129 Hypertensive chronic kidney disease with stage 1 through stage 4 chronic kidney disease, or unspecified chronic kidney disease: Secondary | ICD-10-CM | POA: Diagnosis not present

## 2021-05-23 DIAGNOSIS — R29898 Other symptoms and signs involving the musculoskeletal system: Secondary | ICD-10-CM | POA: Diagnosis not present

## 2021-05-23 DIAGNOSIS — R2681 Unsteadiness on feet: Secondary | ICD-10-CM | POA: Diagnosis not present

## 2021-05-25 DIAGNOSIS — M6281 Muscle weakness (generalized): Secondary | ICD-10-CM | POA: Diagnosis not present

## 2021-05-25 DIAGNOSIS — I129 Hypertensive chronic kidney disease with stage 1 through stage 4 chronic kidney disease, or unspecified chronic kidney disease: Secondary | ICD-10-CM | POA: Diagnosis not present

## 2021-05-25 DIAGNOSIS — Z9181 History of falling: Secondary | ICD-10-CM | POA: Diagnosis not present

## 2021-05-25 DIAGNOSIS — R2681 Unsteadiness on feet: Secondary | ICD-10-CM | POA: Diagnosis not present

## 2021-05-25 DIAGNOSIS — R29898 Other symptoms and signs involving the musculoskeletal system: Secondary | ICD-10-CM | POA: Diagnosis not present

## 2021-05-25 DIAGNOSIS — R41841 Cognitive communication deficit: Secondary | ICD-10-CM | POA: Diagnosis not present

## 2021-05-26 DIAGNOSIS — I129 Hypertensive chronic kidney disease with stage 1 through stage 4 chronic kidney disease, or unspecified chronic kidney disease: Secondary | ICD-10-CM | POA: Diagnosis not present

## 2021-05-26 DIAGNOSIS — R41841 Cognitive communication deficit: Secondary | ICD-10-CM | POA: Diagnosis not present

## 2021-05-26 DIAGNOSIS — R29898 Other symptoms and signs involving the musculoskeletal system: Secondary | ICD-10-CM | POA: Diagnosis not present

## 2021-05-26 DIAGNOSIS — R2681 Unsteadiness on feet: Secondary | ICD-10-CM | POA: Diagnosis not present

## 2021-05-26 DIAGNOSIS — M6281 Muscle weakness (generalized): Secondary | ICD-10-CM | POA: Diagnosis not present

## 2021-05-26 DIAGNOSIS — Z9181 History of falling: Secondary | ICD-10-CM | POA: Diagnosis not present

## 2021-05-27 DIAGNOSIS — Z9181 History of falling: Secondary | ICD-10-CM | POA: Diagnosis not present

## 2021-05-27 DIAGNOSIS — R29898 Other symptoms and signs involving the musculoskeletal system: Secondary | ICD-10-CM | POA: Diagnosis not present

## 2021-05-27 DIAGNOSIS — R41841 Cognitive communication deficit: Secondary | ICD-10-CM | POA: Diagnosis not present

## 2021-05-27 DIAGNOSIS — M6281 Muscle weakness (generalized): Secondary | ICD-10-CM | POA: Diagnosis not present

## 2021-05-27 DIAGNOSIS — R2681 Unsteadiness on feet: Secondary | ICD-10-CM | POA: Diagnosis not present

## 2021-05-27 DIAGNOSIS — I129 Hypertensive chronic kidney disease with stage 1 through stage 4 chronic kidney disease, or unspecified chronic kidney disease: Secondary | ICD-10-CM | POA: Diagnosis not present

## 2021-05-28 DIAGNOSIS — I129 Hypertensive chronic kidney disease with stage 1 through stage 4 chronic kidney disease, or unspecified chronic kidney disease: Secondary | ICD-10-CM | POA: Diagnosis not present

## 2021-05-28 DIAGNOSIS — M6281 Muscle weakness (generalized): Secondary | ICD-10-CM | POA: Diagnosis not present

## 2021-05-28 DIAGNOSIS — Z9181 History of falling: Secondary | ICD-10-CM | POA: Diagnosis not present

## 2021-05-28 DIAGNOSIS — R2681 Unsteadiness on feet: Secondary | ICD-10-CM | POA: Diagnosis not present

## 2021-05-28 DIAGNOSIS — R29898 Other symptoms and signs involving the musculoskeletal system: Secondary | ICD-10-CM | POA: Diagnosis not present

## 2021-05-28 DIAGNOSIS — R41841 Cognitive communication deficit: Secondary | ICD-10-CM | POA: Diagnosis not present

## 2021-05-29 DIAGNOSIS — R41841 Cognitive communication deficit: Secondary | ICD-10-CM | POA: Diagnosis not present

## 2021-05-29 DIAGNOSIS — M6281 Muscle weakness (generalized): Secondary | ICD-10-CM | POA: Diagnosis not present

## 2021-05-29 DIAGNOSIS — I129 Hypertensive chronic kidney disease with stage 1 through stage 4 chronic kidney disease, or unspecified chronic kidney disease: Secondary | ICD-10-CM | POA: Diagnosis not present

## 2021-05-29 DIAGNOSIS — R29898 Other symptoms and signs involving the musculoskeletal system: Secondary | ICD-10-CM | POA: Diagnosis not present

## 2021-05-29 DIAGNOSIS — Z9181 History of falling: Secondary | ICD-10-CM | POA: Diagnosis not present

## 2021-05-29 DIAGNOSIS — R2681 Unsteadiness on feet: Secondary | ICD-10-CM | POA: Diagnosis not present

## 2021-05-30 DIAGNOSIS — R41841 Cognitive communication deficit: Secondary | ICD-10-CM | POA: Diagnosis not present

## 2021-05-30 DIAGNOSIS — R29898 Other symptoms and signs involving the musculoskeletal system: Secondary | ICD-10-CM | POA: Diagnosis not present

## 2021-05-30 DIAGNOSIS — Z23 Encounter for immunization: Secondary | ICD-10-CM | POA: Diagnosis not present

## 2021-05-30 DIAGNOSIS — I129 Hypertensive chronic kidney disease with stage 1 through stage 4 chronic kidney disease, or unspecified chronic kidney disease: Secondary | ICD-10-CM | POA: Diagnosis not present

## 2021-05-30 DIAGNOSIS — M6281 Muscle weakness (generalized): Secondary | ICD-10-CM | POA: Diagnosis not present

## 2021-05-30 DIAGNOSIS — Z9181 History of falling: Secondary | ICD-10-CM | POA: Diagnosis not present

## 2021-05-30 DIAGNOSIS — R2681 Unsteadiness on feet: Secondary | ICD-10-CM | POA: Diagnosis not present

## 2021-06-02 DIAGNOSIS — I129 Hypertensive chronic kidney disease with stage 1 through stage 4 chronic kidney disease, or unspecified chronic kidney disease: Secondary | ICD-10-CM | POA: Diagnosis not present

## 2021-06-02 DIAGNOSIS — R41841 Cognitive communication deficit: Secondary | ICD-10-CM | POA: Diagnosis not present

## 2021-06-02 DIAGNOSIS — R2681 Unsteadiness on feet: Secondary | ICD-10-CM | POA: Diagnosis not present

## 2021-06-02 DIAGNOSIS — R29898 Other symptoms and signs involving the musculoskeletal system: Secondary | ICD-10-CM | POA: Diagnosis not present

## 2021-06-02 DIAGNOSIS — Z9181 History of falling: Secondary | ICD-10-CM | POA: Diagnosis not present

## 2021-06-02 DIAGNOSIS — M6281 Muscle weakness (generalized): Secondary | ICD-10-CM | POA: Diagnosis not present

## 2021-06-03 DIAGNOSIS — R29898 Other symptoms and signs involving the musculoskeletal system: Secondary | ICD-10-CM | POA: Diagnosis not present

## 2021-06-03 DIAGNOSIS — R2681 Unsteadiness on feet: Secondary | ICD-10-CM | POA: Diagnosis not present

## 2021-06-03 DIAGNOSIS — Z9181 History of falling: Secondary | ICD-10-CM | POA: Diagnosis not present

## 2021-06-03 DIAGNOSIS — I129 Hypertensive chronic kidney disease with stage 1 through stage 4 chronic kidney disease, or unspecified chronic kidney disease: Secondary | ICD-10-CM | POA: Diagnosis not present

## 2021-06-03 DIAGNOSIS — M6281 Muscle weakness (generalized): Secondary | ICD-10-CM | POA: Diagnosis not present

## 2021-06-03 DIAGNOSIS — R41841 Cognitive communication deficit: Secondary | ICD-10-CM | POA: Diagnosis not present

## 2021-06-09 DIAGNOSIS — I129 Hypertensive chronic kidney disease with stage 1 through stage 4 chronic kidney disease, or unspecified chronic kidney disease: Secondary | ICD-10-CM | POA: Diagnosis not present

## 2021-06-09 DIAGNOSIS — R29898 Other symptoms and signs involving the musculoskeletal system: Secondary | ICD-10-CM | POA: Diagnosis not present

## 2021-06-09 DIAGNOSIS — R2681 Unsteadiness on feet: Secondary | ICD-10-CM | POA: Diagnosis not present

## 2021-06-09 DIAGNOSIS — R41841 Cognitive communication deficit: Secondary | ICD-10-CM | POA: Diagnosis not present

## 2021-06-09 DIAGNOSIS — M6281 Muscle weakness (generalized): Secondary | ICD-10-CM | POA: Diagnosis not present

## 2021-06-09 DIAGNOSIS — Z9181 History of falling: Secondary | ICD-10-CM | POA: Diagnosis not present

## 2021-06-11 DIAGNOSIS — Z9181 History of falling: Secondary | ICD-10-CM | POA: Diagnosis not present

## 2021-06-11 DIAGNOSIS — I129 Hypertensive chronic kidney disease with stage 1 through stage 4 chronic kidney disease, or unspecified chronic kidney disease: Secondary | ICD-10-CM | POA: Diagnosis not present

## 2021-06-11 DIAGNOSIS — R2681 Unsteadiness on feet: Secondary | ICD-10-CM | POA: Diagnosis not present

## 2021-06-11 DIAGNOSIS — M6281 Muscle weakness (generalized): Secondary | ICD-10-CM | POA: Diagnosis not present

## 2021-06-11 DIAGNOSIS — R29898 Other symptoms and signs involving the musculoskeletal system: Secondary | ICD-10-CM | POA: Diagnosis not present

## 2021-06-11 DIAGNOSIS — R41841 Cognitive communication deficit: Secondary | ICD-10-CM | POA: Diagnosis not present

## 2021-06-12 DIAGNOSIS — R41841 Cognitive communication deficit: Secondary | ICD-10-CM | POA: Diagnosis not present

## 2021-06-16 ENCOUNTER — Telehealth: Payer: Self-pay | Admitting: *Deleted

## 2021-06-16 NOTE — Telephone Encounter (Signed)
Received DME order from [facility] [phone] for patient's Silicone Breast Prosthesis  Placed in Dr. Steve Rattler box for review and signature. Addressed to Dr. Lyndel Safe.   To be faxed back to Fax: [phone] once completed.

## 2021-06-17 DIAGNOSIS — R41841 Cognitive communication deficit: Secondary | ICD-10-CM | POA: Diagnosis not present

## 2021-06-18 ENCOUNTER — Emergency Department (HOSPITAL_COMMUNITY)
Admission: EM | Admit: 2021-06-18 | Discharge: 2021-06-19 | Disposition: A | Discharge status: Skilled Nursing Facility | Payer: Medicare Other | Attending: Emergency Medicine | Admitting: Emergency Medicine

## 2021-06-18 DIAGNOSIS — S0990XA Unspecified injury of head, initial encounter: Secondary | ICD-10-CM | POA: Diagnosis not present

## 2021-06-18 DIAGNOSIS — R519 Headache, unspecified: Secondary | ICD-10-CM | POA: Diagnosis not present

## 2021-06-18 DIAGNOSIS — W19XXXA Unspecified fall, initial encounter: Secondary | ICD-10-CM | POA: Diagnosis not present

## 2021-06-18 DIAGNOSIS — Y9289 Other specified places as the place of occurrence of the external cause: Secondary | ICD-10-CM | POA: Diagnosis not present

## 2021-06-18 DIAGNOSIS — I10 Essential (primary) hypertension: Secondary | ICD-10-CM | POA: Diagnosis not present

## 2021-06-18 DIAGNOSIS — R41841 Cognitive communication deficit: Secondary | ICD-10-CM | POA: Diagnosis not present

## 2021-06-18 DIAGNOSIS — R600 Localized edema: Secondary | ICD-10-CM | POA: Insufficient documentation

## 2021-06-18 DIAGNOSIS — Z7901 Long term (current) use of anticoagulants: Secondary | ICD-10-CM | POA: Insufficient documentation

## 2021-06-18 DIAGNOSIS — N189 Chronic kidney disease, unspecified: Secondary | ICD-10-CM | POA: Insufficient documentation

## 2021-06-18 DIAGNOSIS — I2510 Atherosclerotic heart disease of native coronary artery without angina pectoris: Secondary | ICD-10-CM | POA: Diagnosis not present

## 2021-06-18 DIAGNOSIS — I130 Hypertensive heart and chronic kidney disease with heart failure and stage 1 through stage 4 chronic kidney disease, or unspecified chronic kidney disease: Secondary | ICD-10-CM | POA: Insufficient documentation

## 2021-06-18 DIAGNOSIS — I509 Heart failure, unspecified: Secondary | ICD-10-CM | POA: Diagnosis not present

## 2021-06-18 DIAGNOSIS — W01198A Fall on same level from slipping, tripping and stumbling with subsequent striking against other object, initial encounter: Secondary | ICD-10-CM | POA: Insufficient documentation

## 2021-06-18 DIAGNOSIS — S0003XA Contusion of scalp, initial encounter: Secondary | ICD-10-CM | POA: Diagnosis not present

## 2021-06-18 DIAGNOSIS — M4312 Spondylolisthesis, cervical region: Secondary | ICD-10-CM | POA: Diagnosis not present

## 2021-06-18 DIAGNOSIS — Z79899 Other long term (current) drug therapy: Secondary | ICD-10-CM | POA: Diagnosis not present

## 2021-06-18 DIAGNOSIS — S199XXA Unspecified injury of neck, initial encounter: Secondary | ICD-10-CM | POA: Diagnosis not present

## 2021-06-19 ENCOUNTER — Emergency Department (HOSPITAL_COMMUNITY): Payer: Medicare Other

## 2021-06-19 ENCOUNTER — Encounter: Payer: Self-pay | Admitting: Adult Health

## 2021-06-19 ENCOUNTER — Non-Acute Institutional Stay: Payer: Medicare Other | Admitting: Adult Health

## 2021-06-19 DIAGNOSIS — I25119 Atherosclerotic heart disease of native coronary artery with unspecified angina pectoris: Secondary | ICD-10-CM

## 2021-06-19 DIAGNOSIS — S199XXA Unspecified injury of neck, initial encounter: Secondary | ICD-10-CM | POA: Diagnosis not present

## 2021-06-19 DIAGNOSIS — R41841 Cognitive communication deficit: Secondary | ICD-10-CM | POA: Diagnosis not present

## 2021-06-19 DIAGNOSIS — B372 Candidiasis of skin and nail: Secondary | ICD-10-CM | POA: Diagnosis not present

## 2021-06-19 DIAGNOSIS — Z9181 History of falling: Secondary | ICD-10-CM

## 2021-06-19 DIAGNOSIS — I480 Paroxysmal atrial fibrillation: Secondary | ICD-10-CM

## 2021-06-19 DIAGNOSIS — I10 Essential (primary) hypertension: Secondary | ICD-10-CM | POA: Diagnosis not present

## 2021-06-19 DIAGNOSIS — I25709 Atherosclerosis of coronary artery bypass graft(s), unspecified, with unspecified angina pectoris: Secondary | ICD-10-CM

## 2021-06-19 DIAGNOSIS — M4312 Spondylolisthesis, cervical region: Secondary | ICD-10-CM | POA: Diagnosis not present

## 2021-06-19 DIAGNOSIS — R600 Localized edema: Secondary | ICD-10-CM | POA: Diagnosis not present

## 2021-06-19 DIAGNOSIS — S0990XA Unspecified injury of head, initial encounter: Secondary | ICD-10-CM | POA: Diagnosis not present

## 2021-06-19 LAB — CBC WITH DIFFERENTIAL/PLATELET
Abs Immature Granulocytes: 0.07 10*3/uL (ref 0.00–0.07)
Basophils Absolute: 0 10*3/uL (ref 0.0–0.1)
Basophils Relative: 0 %
Eosinophils Absolute: 0.1 10*3/uL (ref 0.0–0.5)
Eosinophils Relative: 1 %
HCT: 41.8 % (ref 36.0–46.0)
Hemoglobin: 13.2 g/dL (ref 12.0–15.0)
Immature Granulocytes: 1 %
Lymphocytes Relative: 38 %
Lymphs Abs: 2.7 10*3/uL (ref 0.7–4.0)
MCH: 33.2 pg (ref 26.0–34.0)
MCHC: 31.6 g/dL (ref 30.0–36.0)
MCV: 105 fL — ABNORMAL HIGH (ref 80.0–100.0)
Monocytes Absolute: 0.9 10*3/uL (ref 0.1–1.0)
Monocytes Relative: 12 %
Neutro Abs: 3.5 10*3/uL (ref 1.7–7.7)
Neutrophils Relative %: 48 %
Platelets: 216 10*3/uL (ref 150–400)
RBC: 3.98 MIL/uL (ref 3.87–5.11)
RDW: 12.7 % (ref 11.5–15.5)
WBC: 7.2 10*3/uL (ref 4.0–10.5)
nRBC: 0 % (ref 0.0–0.2)

## 2021-06-19 LAB — BASIC METABOLIC PANEL
Anion gap: 8 (ref 5–15)
BUN: 14 mg/dL (ref 8–23)
CO2: 28 mmol/L (ref 22–32)
Calcium: 9.8 mg/dL (ref 8.9–10.3)
Chloride: 104 mmol/L (ref 98–111)
Creatinine, Ser: 0.85 mg/dL (ref 0.44–1.00)
GFR, Estimated: >60 mL/min (ref >60)
Glucose, Bld: 108 mg/dL — ABNORMAL HIGH (ref 70–99)
Potassium: 4.1 mmol/L (ref 3.5–5.1)
Sodium: 140 mmol/L (ref 135–145)

## 2021-06-19 LAB — I-STAT CHEM 8, ED
BUN: 17 mg/dL (ref 8–23)
Calcium, Ion: 1.11 mmol/L — ABNORMAL LOW (ref 1.15–1.40)
Chloride: 103 mmol/L (ref 98–111)
Creatinine, Ser: 0.8 mg/dL (ref 0.44–1.00)
Glucose, Bld: 106 mg/dL — ABNORMAL HIGH (ref 70–99)
HCT: 41 % (ref 36.0–46.0)
Hemoglobin: 13.9 g/dL (ref 12.0–15.0)
Potassium: 4 mmol/L (ref 3.5–5.1)
Sodium: 141 mmol/L (ref 135–145)
TCO2: 27 mmol/L (ref 22–32)

## 2021-06-19 LAB — PROTIME-INR
INR: 1.1 (ref 0.8–1.2)
Prothrombin Time: 14.2 seconds (ref 11.4–15.2)

## 2021-06-19 MED ORDER — TRAMADOL HCL 50 MG PO TABS
50.0000 mg | ORAL_TABLET | Freq: Once | ORAL | Status: AC
  Administered 2021-06-19: 50 mg via ORAL
  Filled 2021-06-19: qty 1

## 2021-06-19 MED ORDER — ACETAMINOPHEN 500 MG PO TABS
500.0000 mg | ORAL_TABLET | Freq: Once | ORAL | Status: AC
  Administered 2021-06-19: 500 mg via ORAL
  Filled 2021-06-19: qty 1

## 2021-06-19 NOTE — Progress Notes (Signed)
Location:  [place] Room Number: 914-A Place of Service:  ALF [phone]) Provider:  Durenda Age, DNP, FNP-BC  Patient Care Team: Virgie Dad, MD as PCP - General (Internal Medicine) Leonie Man, MD as PCP - Cardiology (Cardiology) Mast, [name], NP as Nurse Practitioner (Internal Medicine) Lavonna Monarch, MD as Consulting Physician (Dermatology)  Extended Emergency Contact Information Primary Emergency Contact: La Rose Mobile Phone: [phone] Relation: Daughter Secondary Emergency Contact: Weeks,Tiffany Mobile Phone: [phone] Relation: Daughter Preferred language: English Interpreter needed? No  Code Status:  FULL CODE  Goals of care: Advanced Directive information    06/19/2021    9:50 AM  Advanced Directives  Does Patient Have a Medical Advance Directive? Yes  Type of Paramedic of [place];Living will  Does patient want to make changes to medical advance directive? No - Patient declined  Copy of [place] in Chart? Yes - validated most recent copy scanned in chart (See row information)     Chief Complaint  Patient presents with   Acute Visit    Follow up ED visit.     HPI:  Pt is a 88 y.o. female seen today for an acute visit to follow up a recent ED visit S/P fall hitting her head on the wall while on Eliquis.  She fell asleep on her lounge chair and was transferring to her bed. She had a mechanical fall when her Gripp[place] socks got stuck on the floor. She stated that she fell into the wall hitting the back of her head and then slid down to the ground.  She denies any lightheadedness or dizziness preceding her fall.  No blurry, double vision, nausea or confusion post fall.  She denies pain in the neck.  Staff assisted her in getting up and was transferred to the hospital.  CT cervical spine without contrast and CT head without contrast showed no acute intracranial process nor  evidence of acute fracture.  She was sent back to the facility with no new orders.  She has a PMH of A-fib on Eliquis, heart failure, hypertension, CAD, OSA on CPAP, lumbar spinal stenosis and CKD.  She seen in the room today.  She denied having pain.  She was noted to have redness to right groin area.   Past Medical History:  Diagnosis Date   Atherosclerosis of native coronary artery 2001   LAD & Cx disease -- BMS PCI in 2001; CABG 2002.  PCI in 2006 (per-op TAH)   Atrial fibrillation and flutter ([place]) 04/2019   New diagnosis-major complaint was chest tightness and dyspnea.   Breast cancer ([place])    Cervix dysplasia    GERD (gastroesophageal reflux disease)    Heart attack ([place]) 2002, 2006   Both events reportedly were perioperatively. She has no recollection. Initial PCI was related to angina symptoms (bilateral arm pain)   High cholesterol    Hypertension    Lumbosacral spinal stenosis    Osteoarthritis    Skin cancer    Sleep apnea    Uses CPAP faithfully   Squamous cell carcinoma of skin 02/12/2020   in situ- left neck-anterior (CX35FU)   Past Surgical History:  Procedure Laterality Date   ABDOMINAL HYSTERECTOMY     bone density     CARDIAC CATHETERIZATION  2001    Dr. Glade Lloyd [facility]) 2 vessel CAD involving LAD-D1 and distal Cx-OM. Appearance of dilated tapered left main with no significant atherosclerosis on IVUS --  BMS  PCI of pLAD ([facility] BMS 3.0 mm x 15 mm) & with PTCA of ostial D1, PTCA of dLAD.  BMS PCI pCx [facility] BMS 3.5 mm x 11 mm) - unable to reopen dCx-OM (probable distal Atheroembolic occlusion.  Normal LVEF.   COLONOSCOPY     CORONARY ARTERY BYPASS GRAFT  2002   Unsure of details [facility] in [facility])   [place]  2001   ([place]) [facility] BMS PCI pLAD (3.0 mm x 15 mm) -PTCA of jailed D1 as well as distal LAD; pCx ([facility] BMS 3.5 mm x 11 mm) -unable to open distalCx-OM -thromboembolic occlusion   DIAGNOSTIC MAMMOGRAM     LEFT HEART  CATH AND CORS/GRAFTS ANGIOGRAPHY N/A 04/25/2019   Procedure: LEFT HEART CATH AND CORS/GRAFTS ANGIOGRAPHY;  Surgeon: Troy Sine, MD;  Location: [facility] INV[facility] LAB;;; ost-prox LCx stent @ OM1 ostium =100% CTO, Ost-prox LAD stent ~30% ISR, Ost D1 CTO, prox RCA ~30%. SCG-OM2 patent with ostial stent 30%, LIMA-LAD patent; CTO of SVG-D1.    left knee replacement     MASTECTOMY     NM MYOVIEW LTD  12/2014; 2018   Cardiologist (Dr. Kellie Simmering.  [place], [place] ph# [phone]) -->LEXISCAN: Nonischemic, "normal "   pap smear     tonsilectomy     TRANSTHORACIC ECHOCARDIOGRAM  12/2014   Echocardiogram 12/25/14- normal LVEF and systolic function, EF 26-71%, mild grade 1 diastolic dysfunction   TRANSTHORACIC ECHOCARDIOGRAM  04/24/2019   EF 65 to 70%.  Moderate septal LVH.  GR 1 DD-elevated EDP.  Normal PAP.  Relatively normal valves.  No aortic stenosis.    Allergies  Allergen Reactions   Codeine Nausea Only    Outpatient Encounter Medications as of 06/19/2021  Medication Sig   acetaminophen (TYLENOL) 500 MG tablet Take 2 tablets (1,000 mg total) by mouth 2 (two) times daily.   amiodarone (PACERONE) 100 MG tablet Take 100 mg by mouth daily.   ammonium lactate (AMLACTIN) 12 % cream Apply topically as needed for dry skin.   apixaban (ELIQUIS) 5 MG TABS tablet Take 1 tablet (5 mg total) by mouth 2 (two) times daily.   atorvastatin (LIPITOR) 80 MG tablet Take 1 tablet (80 mg total) by mouth daily.   Cholecalciferol (VITAMIN D3) 50 MCG (2000 UT) TABS Take 2,000 Units by mouth daily after breakfast.   docusate sodium (COLACE) 100 MG capsule Take 1 capsule (100 mg total) by mouth daily.   fexofenadine (ALLEGRA) 180 MG tablet Take 1 tablet (180 mg total) by mouth daily as needed for allergies or rhinitis.   fluticasone (FLONASE) 50 MCG/ACT nasal spray Place 1 spray into both nostrils daily as needed for allergies or rhinitis.   losartan (COZAAR) 25 MG tablet Take 25 mg by mouth daily.   methocarbamol  (ROBAXIN) 500 MG tablet Take 250 mg by mouth as needed.   metoprolol succinate (TOPROL-XL) 50 MG 24 hr tablet Take 1 tablet (50 mg total) by mouth daily. Take with or immediately following a meal.   nitroGLYCERIN (NITROSTAT) 0.4 MG SL tablet Place 0.4 mg under the tongue as needed for chest pain (Every 5 minutes x 3 doses).   nystatin (MYCOSTATIN/NYSTOP) powder Apply 1 application. topically 2 (two) times daily.   omeprazole (PRILOSEC OTC) 20 MG tablet Take 1 tablet (20 mg total) by mouth daily as needed.   polyethylene glycol (MIRALAX / GLYCOLAX) 17 g packet Take 17 g by mouth daily. Give with 4 oz of water.   senna-docusate (SENOKOT-S) 8.6-50 MG  tablet Take 2 tablets by mouth at bedtime.   sertraline (ZOLOFT) 25 MG tablet Take 25 mg by mouth daily.   traMADol (ULTRAM) 50 MG tablet Take 1 tablet (50 mg total) by mouth every 6 (six) hours as needed.   zinc oxide 20 % ointment Apply 1 application topically 3 (three) times daily as needed for irritation. Apply to buttocks after every incontinent episode and as needed for redness.   [DISCONTINUED] amiodarone (PACERONE) 200 MG tablet Take 0.5 tablets (100 mg total) by mouth daily.   No facility-administered encounter medications on file as of 06/19/2021.    Review of Systems  Constitutional:  Negative for appetite change, chills, fatigue and fever.  HENT:  Negative for congestion, hearing loss, rhinorrhea and sore throat.   Eyes: Negative.   Respiratory:  Negative for cough, shortness of breath and wheezing.   Cardiovascular:  Negative for chest pain, palpitations and leg swelling.  Gastrointestinal:  Negative for abdominal pain, constipation, diarrhea, nausea and vomiting.  Genitourinary:  Negative for dysuria.  Musculoskeletal:  Negative for arthralgias, back pain and myalgias.  Skin:  Positive for rash. Negative for color change and wound.  Neurological:  Negative for dizziness, weakness and headaches.  Psychiatric/Behavioral:  Negative for  behavioral problems. The patient is not nervous/anxious.        Immunization History  Administered Date(s) Administered   Fluad Quad(high Dose 65+) 10/28/2020   Influenza, High Dose Seasonal PF 10/21/2016, 09/24/2017, 10/25/2019   Influenza-Unspecified 10/03/2011, 10/04/2012, 01/13/2016   Moderna SARS-COV2 Booster Vaccination 11/21/2019, 06/11/2020   Moderna Sars-Covid-2 Vaccination 01/16/2019, 02/13/2019, 03/13/2019   Pneumococcal-Unspecified 01/13/2016   Tetanus 12/01/2017   Zoster Recombinat (Shingrix) 08/24/2018, 02/22/2020   Pertinent  Health Maintenance Due  Topic Date Due   HEMOGLOBIN A1C  12/01/2019   OPHTHALMOLOGY EXAM  05/30/2021   INFLUENZA VACCINE  08/12/2021   FOOT EXAM  10/17/2021   DEXA SCAN  Completed      05/25/2019    1:28 PM 09/12/2020    2:57 PM 10/17/2020    2:53 PM 02/07/2021   12:47 PM 06/19/2021   12:11 AM  Fall Risk  Falls in the past year? 0 0 0 1   Was there an injury with Fall?  0 0 0   Fall Risk Category Calculator  0 0 1   Fall Risk Category  Low Low Low   Patient Fall Risk Level  Low fall risk Low fall risk Moderate fall risk Moderate fall risk  Patient at Risk for Falls Due to  No Fall Risks No Fall Risks History of fall(s);Impaired balance/gait;Impaired mobility;Orthopedic patient   Patient at Risk for Falls Due to - Comments    spinal stenosis of lumbar   Fall risk Follow up  Falls evaluation completed Falls evaluation completed Falls evaluation completed;Education provided;Falls prevention discussed      Vitals:   06/19/21 0935  BP: (!) 146/72  Pulse: 72  Resp: 18  Temp: (!) 97 F (36.1 C)  SpO2: 94%  Weight: 185 lb 6.4 oz (84.1 kg)  Height: '5\' 7"'$  (1.702 m)   Body mass index is 29.04 kg/m.  Physical Exam Constitutional:      Appearance: Normal appearance.  HENT:     Head: Normocephalic and atraumatic.     Nose: Nose normal.     Mouth/Throat:     Mouth: Mucous membranes are moist.  Eyes:     Conjunctiva/sclera: Conjunctivae  normal.  Cardiovascular:     Rate and Rhythm: Normal rate and regular  rhythm.  Pulmonary:     Effort: Pulmonary effort is normal.     Breath sounds: Normal breath sounds.  Abdominal:     General: Bowel sounds are normal.     Palpations: Abdomen is soft.  Musculoskeletal:        General: Normal range of motion.     Cervical back: Normal range of motion.  Skin:    General: Skin is warm and dry.     Findings: Erythema present.     Comments: Redness to right groin.  Neurological:     General: No focal deficit present.     Mental Status: She is alert and oriented to person, place, and time.  Psychiatric:        Mood and Affect: Mood normal.        Behavior: Behavior normal.        Thought Content: Thought content normal.        Judgment: Judgment normal.        Labs reviewed: Recent Labs    03/26/21 0000 04/30/21 0000 06/19/21 0006 06/19/21 0010  NA 140 140 140 141  K 4.4 4.2 4.1 4.0  CL 106 103 104 103  CO2 27* 26* 28  --   GLUCOSE  --   --  108* 106*  BUN '21 16 14 17  '$ CREATININE 0.9 0.9 0.85 0.80  CALCIUM 9.2 9.5 9.8  --    Recent Labs    12/02/20 0000 03/26/21 0000  AST 17 15  ALT 15 12  ALKPHOS 43 47  ALBUMIN 3.6 3.8   Recent Labs    12/02/20 0000 03/26/21 0000 05/01/21 0000 06/19/21 0006 06/19/21 0010  WBC 6.0 6.5 6.9 7.2  --   NEUTROABS 10.70 3,627.00  --  3.5  --   HGB 12.4 11.9* 11.7* 13.2 13.9  HCT 37 35* 35* 41.8 41.0  MCV  --   --   --  105.0*  --   PLT 248 226 221 216  --    Lab Results  Component Value Date   TSH 1.510 04/05/2020   Lab Results  Component Value Date   HGBA1C 5.9 (H) 05/31/2019   Lab Results  Component Value Date   CHOL 169 04/05/2020   HDL 70 04/05/2020   LDLCALC 74 04/05/2020   TRIG 150 (H) 04/05/2020   CHOLHDL 2.4 04/05/2020    Significant Diagnostic Results in last 30 days:  CT Head Wo Contrast  Result Date: 06/19/2021 CLINICAL DATA:  Fall, head and neck trauma. EXAM: CT HEAD WITHOUT CONTRAST CT CERVICAL  SPINE WITHOUT CONTRAST TECHNIQUE: Multidetector CT imaging of the head and cervical spine was performed following the standard protocol without intravenous contrast. Multiplanar CT image reconstructions of the cervical spine were also generated. RADIATION DOSE REDUCTION: This exam was performed according to the departmental dose-optimization program which includes automated exposure control, adjustment of the mA and/or kV according to patient size and/or use of iterative reconstruction technique. COMPARISON:  None Available. FINDINGS: CT HEAD FINDINGS Brain: No acute intracranial hemorrhage, midline shift or mass effect. No extra-axial fluid collection. Diffuse atrophy is noted. Subcortical and periventricular white matter hypodensities are noted bilaterally. No hydrocephalus. An old lacunar infarct is noted in the basal ganglia on the left. Vascular: Atherosclerotic calcification of the carotid siphons. Atherosclerotic calcification and dolichoectasia of the basilar artery and left vertebral artery. Skull: Normal. Negative for fracture or focal lesion. Sinuses/Orbits: Mild mucosal thickening in the ethmoid air cells bilaterally. No acute orbital abnormality. Other: Small  scalp contusion over the parietal bone on the right. CT CERVICAL SPINE FINDINGS Alignment: There is mild anterolisthesis at C4-C5, C5-C6 and C6-C7. Skull base and vertebrae: No acute fracture. No primary bone lesion or focal pathologic process. Soft tissues and spinal canal: No prevertebral fluid or swelling. No visible canal hematoma. Disc levels: Multilevel intervertebral disc space narrowing, uncovertebral osteophyte formation, disc herniations, and facet arthropathy. Upper chest: Aortic atherosclerosis. Other: None. IMPRESSION: 1. No acute intracranial process. 2. Atrophy with chronic microvascular ischemic changes. 3. Small contusion over the parietal bone on the right. 4. Multilevel degenerative changes in the cervical spine without evidence  of acute fracture. Electronically Signed   By: Brett Fairy M.D.   On: 06/19/2021 00:45   CT Cervical Spine Wo Contrast  Result Date: 06/19/2021 CLINICAL DATA:  Fall, head and neck trauma. EXAM: CT HEAD WITHOUT CONTRAST CT CERVICAL SPINE WITHOUT CONTRAST TECHNIQUE: Multidetector CT imaging of the head and cervical spine was performed following the standard protocol without intravenous contrast. Multiplanar CT image reconstructions of the cervical spine were also generated. RADIATION DOSE REDUCTION: This exam was performed according to the departmental dose-optimization program which includes automated exposure control, adjustment of the mA and/or kV according to patient size and/or use of iterative reconstruction technique. COMPARISON:  None Available. FINDINGS: CT HEAD FINDINGS Brain: No acute intracranial hemorrhage, midline shift or mass effect. No extra-axial fluid collection. Diffuse atrophy is noted. Subcortical and periventricular white matter hypodensities are noted bilaterally. No hydrocephalus. An old lacunar infarct is noted in the basal ganglia on the left. Vascular: Atherosclerotic calcification of the carotid siphons. Atherosclerotic calcification and dolichoectasia of the basilar artery and left vertebral artery. Skull: Normal. Negative for fracture or focal lesion. Sinuses/Orbits: Mild mucosal thickening in the ethmoid air cells bilaterally. No acute orbital abnormality. Other: Small scalp contusion over the parietal bone on the right. CT CERVICAL SPINE FINDINGS Alignment: There is mild anterolisthesis at C4-C5, C5-C6 and C6-C7. Skull base and vertebrae: No acute fracture. No primary bone lesion or focal pathologic process. Soft tissues and spinal canal: No prevertebral fluid or swelling. No visible canal hematoma. Disc levels: Multilevel intervertebral disc space narrowing, uncovertebral osteophyte formation, disc herniations, and facet arthropathy. Upper chest: Aortic atherosclerosis. Other:  None. IMPRESSION: 1. No acute intracranial process. 2. Atrophy with chronic microvascular ischemic changes. 3. Small contusion over the parietal bone on the right. 4. Multilevel degenerative changes in the cervical spine without evidence of acute fracture. Electronically Signed   By: Brett Fairy M.D.   On: 06/19/2021 00:45    Assessment/Plan  1. Status post fall -  CT head and cervical  spine negative for acute process -   neurologically intact -   fall precautions  2. Paroxysmal atrial fibrillation/flutter ([facility]) -  continue amiodarone andMetoprolol succinate for rate control and Eliquis for anticoagulation  3. Candidal skin infection -  started on Nystatin powder -  keep area clean and dry, monitor for skin breakdown  4. Essential hypertension -Blood pressure well controlled Continue current medications  5. Coronary artery disease involving coronary bypass graft of native heart with angina pectoris ([place]) -  no chest pains -Continue NTG PRN and atorvastatin    Family/ staff Communication: Discussed plan of care with resident and charge nurse.  Labs/tests ordered: None    Durenda Age, DNP, MSN, FNP-BC [facility] [phone] (Monday-Friday 8:00 a.m. - 5:00 p.m.) [phone] (after hours)

## 2021-06-19 NOTE — Progress Notes (Signed)
Orthopedic Tech Progress Note Patient Details:  Stephanie Shea 04-02-1933 [id number]  Patient ID: Stephanie Shea, female   DOB: 21-Aug-1933, 88 y.o.   MRN: [id number] Level II; not needed at the moment.  [name] 06/19/2021, 12:04 AM

## 2021-06-19 NOTE — ED Notes (Signed)
Patient transported to CT 

## 2021-06-19 NOTE — ED Provider Notes (Signed)
[facility] EMERGENCY DEPARTMENT Provider Note   CSN: [id number] Arrival date & time: 06/18/21  2354     History  Chief Complaint  Patient presents with   Lytle Michaels    Stephanie Shea is a 88 y.o. female who presents via EMS with  concern [facility] with [facility] her head while on Eliquis.  Patient states that she had fallen asleep in her lounge chair and woke up and was walking to her bedroom to go to sleep when her Grippi socks got stuck on the floor and she had a mechanical fall.  She said she fell into the wall hitting the back of her head and then slid down the wall to the ground.  Does associate her head quite hard.  Denies any lightheadedness or dizziness preceding her fall.  Denies any blurry, double vision, nausea, or confusion since that time.  No LOC.  No pain in the neck.  No other injuries.  Did require assistance by facility staffing to rise from the ground.  I personally reviewed this patient's medical records which is history of A-fib anticoagulated on Eliquis, heart failure, hypertension, CAD, OSA on CPAP, lumbar spinal stenosis.  CKD.  HPI     Home Medications Prior to Admission medications   Medication Sig Start Date End Date Taking? Authorizing Provider  acetaminophen (TYLENOL) 500 MG tablet Take 2 tablets (1,000 mg total) by mouth 2 (two) times daily. 02/24/21   Mast, [name], NP  amiodarone (PACERONE) 200 MG tablet Take 0.5 tablets (100 mg total) by mouth daily. 02/24/21   Mast, [name], NP  ammonium lactate (AMLACTIN) 12 % cream Apply topically as needed for dry skin. 02/24/21   Mast, [name], NP  apixaban (ELIQUIS) 5 MG TABS tablet Take 1 tablet (5 mg total) by mouth 2 (two) times daily. 02/24/21   Mast, [name], NP  atorvastatin (LIPITOR) 80 MG tablet Take 1 tablet (80 mg total) by mouth daily. 02/24/21   Mast, [name], NP  Cholecalciferol (VITAMIN D3) 50 MCG (2000 UT) TABS Take 2,000 Units by mouth daily after breakfast. 02/24/21    Mast, [name], NP  docusate sodium (COLACE) 100 MG capsule Take 1 capsule (100 mg total) by mouth daily. 02/24/21   Mast, [name], NP  fexofenadine (ALLEGRA) 180 MG tablet Take 1 tablet (180 mg total) by mouth daily as needed for allergies or rhinitis. 02/24/21   Mast, [name], NP  fluticasone (FLONASE) 50 MCG/ACT nasal spray Place 1 spray into both nostrils daily as needed for allergies or rhinitis. 02/24/21   Mast, [name], NP  losartan (COZAAR) 25 MG tablet Take 25 mg by mouth daily.    [provider]  methocarbamol (ROBAXIN) 500 MG tablet Take 250 mg by mouth at bedtime as needed.    [provider]  metoprolol succinate (TOPROL-XL) 50 MG 24 hr tablet Take 1 tablet (50 mg total) by mouth daily. Take with or immediately following a meal. 02/24/21   Mast, [name], NP  nitroGLYCERIN (NITROSTAT) 0.4 MG SL tablet Place 0.4 mg under the tongue as needed for chest pain (Every 5 minutes x 3 doses).    [provider]  omeprazole (PRILOSEC OTC) 20 MG tablet Take 1 tablet (20 mg total) by mouth daily as needed. 02/24/21   Mast, [name], NP  polyethylene glycol (MIRALAX / GLYCOLAX) 17 g packet Take 17 g by mouth daily. Give with 4 oz of water. 02/24/21  Mast, [name], NP  senna-docusate (SENOKOT-S) 8.6-50 MG tablet Take 2 tablets by mouth at bedtime.    [provider]  traMADol (ULTRAM) 50 MG tablet Take 1 tablet (50 mg total) by mouth every 6 (six) hours as needed. 11/26/20   Mast, [name], NP  zinc oxide 20 % ointment Apply 1 application topically 3 (three) times daily as needed for irritation. Apply to buttocks after every incontinent episode and as needed for redness. 02/24/21   Mast, [name], NP      Allergies    Codeine    Review of Systems   Review of Systems  Unable to perform ROS: Acuity of condition    Physical Exam Updated Vital Signs BP (!) 172/86   Pulse 68   Temp (!) 97.5 F (36.4 C) (Oral)   Resp 20   Ht '5\' 7"'$  (1.702 m)   Wt 84.8 kg   SpO2 95%   BMI 29.29 kg/m   Physical Exam Vitals and nursing note reviewed.  Constitutional:      Appearance: She is obese. She is not ill-appearing or toxic-appearing.  HENT:     Head: Normocephalic and atraumatic.      Right Ear: No hemotympanum.     Left Ear: No hemotympanum.     Nose: Nose normal.     Mouth/Throat:     Mouth: Mucous membranes are moist.     Pharynx: Oropharynx is clear. Uvula midline. No oropharyngeal exudate or posterior oropharyngeal erythema.  Eyes:     General: Lids are normal. Vision grossly intact.        Right eye: No discharge.        Left eye: No discharge.     Extraocular Movements: Extraocular movements intact.     Conjunctiva/sclera: Conjunctivae normal.     Pupils: Pupils are equal, round, and reactive to light.  Neck:     Trachea: Trachea and phonation normal.  Cardiovascular:     Rate and Rhythm: Normal rate and regular rhythm.     Pulses: Normal pulses.     Heart sounds: Normal heart sounds.  Pulmonary:     Effort: Pulmonary effort is normal. No tachypnea, bradypnea, accessory muscle usage, prolonged expiration or respiratory distress.     Breath sounds: Normal breath sounds. No wheezing or rales.  Abdominal:     General: Bowel sounds are normal. There is no distension.     Palpations: Abdomen is soft.     Tenderness: There is no abdominal tenderness. There is no guarding or rebound.  Musculoskeletal:        General: No deformity.     Cervical back: Normal range of motion and neck supple.     Right lower leg: 2+ Pitting Edema present.     Left lower leg: 2+ Pitting Edema present.     Comments: No deformities, skin tears, lacerations, or evidence of traumatic injuries on extremities.  Lymphadenopathy:     Cervical: No cervical adenopathy.  Skin:    General: Skin is warm and dry.     Capillary Refill: Capillary refill takes less than 2 seconds.  Neurological:     Mental Status: She is alert and oriented to person, place, and time. Mental status is at baseline.      GCS: GCS eye subscore is 4. GCS verbal subscore is 5. GCS motor subscore is 6.     Cranial Nerves: Cranial nerves 2-12 are intact.     Sensory: Sensation is intact.  Motor: Motor function is intact.     Coordination: Coordination is intact.  Psychiatric:        Mood and Affect: Mood normal.     ED Results / Procedures / Treatments   Labs (all labs ordered are listed, but only abnormal results are displayed) Labs Reviewed  CBC WITH DIFFERENTIAL/PLATELET - Abnormal; Notable for the following components:      Result Value   MCV 105.0 (*)    All other components within normal limits  BASIC METABOLIC PANEL - Abnormal; Notable for the following components:   Glucose, Bld 108 (*)    All other components within normal limits  I-STAT CHEM 8, ED - Abnormal; Notable for the following components:   Glucose, Bld 106 (*)    Calcium, Ion 1.11 (*)    All other components within normal limits  PROTIME-INR    EKG None  Radiology CT Head Wo Contrast  Result Date: 06/19/2021 CLINICAL DATA:  Fall, head and neck trauma. EXAM: CT HEAD WITHOUT CONTRAST CT CERVICAL SPINE WITHOUT CONTRAST TECHNIQUE: Multidetector CT imaging of the head and cervical spine was performed following the standard protocol without intravenous contrast. Multiplanar CT image reconstructions of the cervical spine were also generated. RADIATION DOSE REDUCTION: This exam was performed according to the departmental dose-optimization program which includes automated exposure control, adjustment of the mA and/or kV according to patient size and/or use of iterative reconstruction technique. COMPARISON:  None Available. FINDINGS: CT HEAD FINDINGS Brain: No acute intracranial hemorrhage, midline shift or mass effect. No extra-axial fluid collection. Diffuse atrophy is noted. Subcortical and periventricular white matter hypodensities are noted bilaterally. No hydrocephalus. An old lacunar infarct is noted in the basal ganglia on the left.  Vascular: Atherosclerotic calcification of the carotid siphons. Atherosclerotic calcification and dolichoectasia of the basilar artery and left vertebral artery. Skull: Normal. Negative for fracture or focal lesion. Sinuses/Orbits: Mild mucosal thickening in the ethmoid air cells bilaterally. No acute orbital abnormality. Other: Small scalp contusion over the parietal bone on the right. CT CERVICAL SPINE FINDINGS Alignment: There is mild anterolisthesis at C4-C5, C5-C6 and C6-C7. Skull base and vertebrae: No acute fracture. No primary bone lesion or focal pathologic process. Soft tissues and spinal canal: No prevertebral fluid or swelling. No visible canal hematoma. Disc levels: Multilevel intervertebral disc space narrowing, uncovertebral osteophyte formation, disc herniations, and facet arthropathy. Upper chest: Aortic atherosclerosis. Other: None. IMPRESSION: 1. No acute intracranial process. 2. Atrophy with chronic microvascular ischemic changes. 3. Small contusion over the parietal bone on the right. 4. Multilevel degenerative changes in the cervical spine without evidence of acute fracture. Electronically Signed   By: Brett Fairy M.D.   On: 06/19/2021 00:45   CT Cervical Spine Wo Contrast  Result Date: 06/19/2021 CLINICAL DATA:  Fall, head and neck trauma. EXAM: CT HEAD WITHOUT CONTRAST CT CERVICAL SPINE WITHOUT CONTRAST TECHNIQUE: Multidetector CT imaging of the head and cervical spine was performed following the standard protocol without intravenous contrast. Multiplanar CT image reconstructions of the cervical spine were also generated. RADIATION DOSE REDUCTION: This exam was performed according to the departmental dose-optimization program which includes automated exposure control, adjustment of the mA and/or kV according to patient size and/or use of iterative reconstruction technique. COMPARISON:  None Available. FINDINGS: CT HEAD FINDINGS Brain: No acute intracranial hemorrhage, midline shift or  mass effect. No extra-axial fluid collection. Diffuse atrophy is noted. Subcortical and periventricular white matter hypodensities are noted bilaterally. No hydrocephalus. An old lacunar infarct is noted in the  basal ganglia on the left. Vascular: Atherosclerotic calcification of the carotid siphons. Atherosclerotic calcification and dolichoectasia of the basilar artery and left vertebral artery. Skull: Normal. Negative for fracture or focal lesion. Sinuses/Orbits: Mild mucosal thickening in the ethmoid air cells bilaterally. No acute orbital abnormality. Other: Small scalp contusion over the parietal bone on the right. CT CERVICAL SPINE FINDINGS Alignment: There is mild anterolisthesis at C4-C5, C5-C6 and C6-C7. Skull base and vertebrae: No acute fracture. No primary bone lesion or focal pathologic process. Soft tissues and spinal canal: No prevertebral fluid or swelling. No visible canal hematoma. Disc levels: Multilevel intervertebral disc space narrowing, uncovertebral osteophyte formation, disc herniations, and facet arthropathy. Upper chest: Aortic atherosclerosis. Other: None. IMPRESSION: 1. No acute intracranial process. 2. Atrophy with chronic microvascular ischemic changes. 3. Small contusion over the parietal bone on the right. 4. Multilevel degenerative changes in the cervical spine without evidence of acute fracture. Electronically Signed   By: Brett Fairy M.D.   On: 06/19/2021 00:45    Procedures Procedures    Medications Ordered in ED Medications  traMADol (ULTRAM) tablet 50 mg (has no administration in time range)  acetaminophen (TYLENOL) tablet 500 mg (has no administration in time range)    ED Course/ Medical Decision Making/ A&P                           Medical Decision Making 88 year old female with mechanical fall on Eliquis.  Hypertensive on intake, VS otherwise normal. Cardiopulmonary and abdominal exams are benign. No evidence of injury to the extremities.  Head is  normocephalic with mild tenderness palpation of the right parietal area without bruising, hematoma, or laceration.  Patient is neurologically intact with GCS of 15, no full deficit on neurologic exam.    Amount and/or Complexity of Data Reviewed Labs: ordered.    Details: CBC unremarkable, BMP unremarkable, INR is normal. Radiology: ordered.    Details: CT head and C-spine negative for acute intracranial abnormality or traumatic injury to the C-spine. ECG/medicine tests:     Details: EKG with sinus rhythm without STEMI.  Risk OTC drugs. Prescription drug management.   Patient remains well-appearing at this time though becoming sore after fall.  Home PRN pain meds ordered. No further work-up warranted in the ER given lack of acute traumatic injury this evening.  Stephanie Shea and her daughter voiced understanding of her medical evaluation and treatment plan. Each of their questions answered to their expressed satisfaction.  Return precautions were given.  Patient is well-appearing, stable, and was discharged in good condition.  This chart was dictated using voice recognition software, Dragon. Despite the best efforts of this provider to proofread and correct errors, errors may still occur which can change documentation meaning.   Final Clinical Impression(s) / ED Diagnoses Final diagnoses:  Fall, initial encounter    Rx / DC Orders ED Discharge Orders     None         Aura Dials 06/19/21 2774    Merrily Pew, MD 06/20/21 0222

## 2021-06-19 NOTE — Discharge Instructions (Addendum)
You are seen in the ER today after fall.  There is no broken bones or bleeding in your brain.  Follow-up with your primary care doctor and return to the ER with any serious symptoms.

## 2021-06-19 NOTE — ED Triage Notes (Signed)
Pt BIB from [facility] for an unwitnessed fall on Eliquis. Pt reports no loss of consciousness but hit her head on the wall. Pt is alert and oriented x 4    198/100

## 2021-06-20 DIAGNOSIS — R41841 Cognitive communication deficit: Secondary | ICD-10-CM | POA: Diagnosis not present

## 2021-06-23 DIAGNOSIS — R41841 Cognitive communication deficit: Secondary | ICD-10-CM | POA: Diagnosis not present

## 2021-06-24 ENCOUNTER — Encounter: Payer: Self-pay | Admitting: Internal Medicine

## 2021-06-24 ENCOUNTER — Non-Acute Institutional Stay: Payer: Medicare Other | Admitting: Internal Medicine

## 2021-06-24 DIAGNOSIS — R410 Disorientation, unspecified: Secondary | ICD-10-CM | POA: Diagnosis not present

## 2021-06-24 DIAGNOSIS — F331 Major depressive disorder, recurrent, moderate: Secondary | ICD-10-CM | POA: Diagnosis not present

## 2021-06-24 DIAGNOSIS — M19011 Primary osteoarthritis, right shoulder: Secondary | ICD-10-CM | POA: Diagnosis not present

## 2021-06-24 DIAGNOSIS — I480 Paroxysmal atrial fibrillation: Secondary | ICD-10-CM

## 2021-06-24 DIAGNOSIS — R531 Weakness: Secondary | ICD-10-CM | POA: Diagnosis not present

## 2021-06-24 DIAGNOSIS — I10 Essential (primary) hypertension: Secondary | ICD-10-CM

## 2021-06-24 DIAGNOSIS — M25511 Pain in right shoulder: Secondary | ICD-10-CM | POA: Diagnosis not present

## 2021-06-24 NOTE — Progress Notes (Signed)
Location:   [facility] Room Number: 315 Place of Service:  ALF [phone]) Provider:  Veleta Miners MD  Virgie Dad, MD  Patient Care Team: Virgie Dad, MD as PCP - General (Internal Medicine) Leonie Man, MD as PCP - Cardiology (Cardiology) Mast, [name], NP as Nurse Practitioner (Internal Medicine) Lavonna Monarch, MD as Consulting Physician (Dermatology)  Extended Emergency Contact Information Primary Emergency Contact: [place] Mobile Phone: [phone] Relation: Daughter Secondary Emergency Contact: Weeks,Tiffany Mobile Phone: [phone] Relation: Daughter Preferred language: [place] Molt Interpreter needed? No  Code Status:  Full Code Goals of care: Advanced Directive information    06/24/2021   11:21 AM  Advanced Directives  Does Patient Have a Medical Advance Directive? Yes  Type of Paramedic of [place];Out of facility DNR (pink MOST or yellow form);Living will  Does patient want to make changes to medical advance directive? No - Patient declined  Copy of [place] in Chart? Yes - validated most recent copy scanned in chart (See row information)  Pre-existing out of facility DNR order (yellow form or pink MOST form) Yellow form placed in chart (order not valid for inpatient use);Pink MOST form placed in chart (order not valid for inpatient use)     Chief Complaint  Patient presents with   Acute Visit    HPI:  Pt is a 88 y.o. female seen today for an acute visit for number of complains  Lives in [place] Patient has a history of CAD s/p CABG in 2002 S/p cath in 04/21 which showed occluded SVG to Diag but patent LIMA to LAD and SVG to CX.EF. On Medical Management EF 65% PAF diagnosed in 4/21 on Eliquis Sleep Apnea uses CPAP H/I Skin Cancer H/o Breast Cancer s/p Mastectomy  HTN,HLD and Arthritis Cognitive impairment LE Edema .Also had Spinal Injection in 01/23   Patient Daughter  wanted to meet and had many complains Increased Confusion Per patient's daughter patient has been getting more cognitively impaired for past few weeks.  She thinks it can be since she was started on Zoloft. Inability to do ADLs For past 2 weeks patient is calling for help from the nurses  She presently is in AL and the nurses are not able to help her that much Patient states that she cannot get up by herself or go to the bathroom Patient also had a fall few days ago when she fell while doing her transfers.  CT scan of her head was negative for any acute process.  Did show cerebral atrophy  Patient is also not using her CPAP properly is not able to sleep at night.  She is taking a lot of naps during the daytime. Also complaining of pain in her right shoulder which is new since her fall  Therapy says  they had discharged her a week ago when she was walking with her walker. Fever or chills.  No dysuria or  urinary frequency Patient also said she is depressed  Past Medical History:  Diagnosis Date   Atherosclerosis of native coronary artery 2001   LAD & Cx disease -- BMS PCI in 2001; CABG 2002.  PCI in 2006 (per-op TAH)   Atrial fibrillation and flutter ([place]) 04/2019   New diagnosis-major complaint was chest tightness and dyspnea.   Breast cancer ([place])    Cervix dysplasia    GERD (gastroesophageal reflux disease)    Heart attack ([place]) 2002, 2006   Both events reportedly were perioperatively. She  has no recollection. Initial PCI was related to angina symptoms (bilateral arm pain)   High cholesterol    Hypertension    Lumbosacral spinal stenosis    Osteoarthritis    Skin cancer    Sleep apnea    Uses CPAP faithfully   Squamous cell carcinoma of skin 02/12/2020   in situ- left neck-anterior (CX35FU)   Past Surgical History:  Procedure Laterality Date   ABDOMINAL HYSTERECTOMY     bone density     CARDIAC CATHETERIZATION  2001    Dr. Glade [facility]) 2 vessel CAD involving  LAD-D1 and distal Cx-OM. Appearance of dilated tapered left main with no significant atherosclerosis on IVUS --  BMS PCI of pLAD ([facility] BMS 3.0 mm x 15 mm) & with PTCA of ostial D1, PTCA of dLAD.  BMS PCI pCx [facility] BMS 3.5 mm x 11 mm) - unable to reopen dCx-OM (probable distal Atheroembolic occlusion.  Normal LVEF.   COLONOSCOPY     CORONARY ARTERY BYPASS GRAFT  2002   Unsure of details [facility], in [facility])   [place]  2001   ([place]) [facility] BMS PCI pLAD (3.0 mm x 15 mm) -PTCA of jailed D1 as well as distal LAD; pCx ([facility] BMS 3.5 mm x 11 mm) -unable to open distalCx-OM -thromboembolic occlusion   DIAGNOSTIC MAMMOGRAM     LEFT HEART CATH AND CORS/GRAFTS ANGIOGRAPHY N/A 04/25/2019   Procedure: LEFT HEART CATH AND CORS/GRAFTS ANGIOGRAPHY;  Surgeon: Troy Sine, MD;  Location: [facility] INVASIVE CV LAB;;; ost-prox LCx stent @ OM1 ostium =100% CTO, Ost-prox LAD stent ~30% ISR, Ost D1 CTO, prox RCA ~30%. SCG-OM2 patent with ostial stent 30%, LIMA-LAD patent; CTO of SVG-D1.    left knee replacement     MASTECTOMY     NM MYOVIEW LTD  12/2014; 2018   Cardiologist (Dr. Kellie Simmering.  [place], [place] ph# [phone]) -->LEXISCAN: Nonischemic, "normal "   pap smear     tonsilectomy     TRANSTHORACIC ECHOCARDIOGRAM  12/2014   Echocardiogram 12/25/14- normal LVEF and systolic function, EF 47-42%, mild grade 1 diastolic dysfunction   TRANSTHORACIC ECHOCARDIOGRAM  04/24/2019   EF 65 to 70%.  Moderate septal LVH.  GR 1 DD-elevated EDP.  Normal PAP.  Relatively normal valves.  No aortic stenosis.    Allergies  Allergen Reactions   Codeine Nausea Only    Allergies as of 06/24/2021       Reactions   Codeine Nausea Only        Medication List        Accurate as of June 24, 2021 11:21 AM. If you have any questions, ask your nurse or doctor.          acetaminophen 500 MG tablet Commonly known as: TYLENOL Take 2 tablets (1,000 mg total) by mouth 2 (two) times daily.    amiodarone 100 MG tablet Commonly known as: PACERONE Take 100 mg by mouth daily.   ammonium lactate 12 % cream Commonly known as: AMLACTIN Apply topically as needed for dry skin.   apixaban 5 MG Tabs tablet Commonly known as: Eliquis Take 1 tablet (5 mg total) by mouth 2 (two) times daily.   atorvastatin 80 MG tablet Commonly known as: LIPITOR Take 1 tablet (80 mg total) by mouth daily.   docusate sodium 100 MG capsule Commonly known as: COLACE Take 1 capsule (100 mg total) by mouth daily.   fexofenadine 180 MG tablet Commonly known as: ALLEGRA Take 1 tablet (180  mg total) by mouth daily as needed for allergies or rhinitis.   fluticasone 50 MCG/ACT nasal spray Commonly known as: FLONASE Place 1 spray into both nostrils daily as needed for allergies or rhinitis.   losartan 25 MG tablet Commonly known as: COZAAR Take 25 mg by mouth daily.   methocarbamol 500 MG tablet Commonly known as: ROBAXIN Take 250 mg by mouth as needed.   metoprolol succinate 50 MG 24 hr tablet Commonly known as: TOPROL-XL Take 1 tablet (50 mg total) by mouth daily. Take with or immediately following a meal.   nitroGLYCERIN 0.4 MG SL tablet Commonly known as: NITROSTAT Place 0.4 mg under the tongue as needed for chest pain (Every 5 minutes x 3 doses).   nystatin powder Commonly known as: MYCOSTATIN/NYSTOP Apply 1 application. topically 2 (two) times daily.   omeprazole 20 MG tablet Commonly known as: PRILOSEC OTC Take 1 tablet (20 mg total) by mouth daily as needed.   polyethylene glycol 17 g packet Commonly known as: MIRALAX / GLYCOLAX Take 17 g by mouth daily. Give with 4 oz of water.   senna-docusate 8.6-50 MG tablet Commonly known as: Senokot-S Take 2 tablets by mouth at bedtime.   sertraline 25 MG tablet Commonly known as: ZOLOFT Take 25 mg by mouth daily.   traMADol 50 MG tablet Commonly known as: Ultram Take 1 tablet (50 mg total) by mouth every 6 (six) hours as needed.    Vitamin D3 50 MCG (2000 UT) Tabs Take 2,000 Units by mouth daily after breakfast.   zinc oxide 20 % ointment Apply 1 application topically 3 (three) times daily as needed for irritation. Apply to buttocks after every incontinent episode and as needed for redness.        Review of Systems  Constitutional:  Positive for activity change. Negative for appetite change.  HENT: Negative.    Respiratory:  Negative for cough and shortness of breath.   Cardiovascular:  Negative for leg swelling.  Gastrointestinal:  Negative for constipation.  Genitourinary: Negative.   Musculoskeletal:  Positive for gait problem. Negative for arthralgias and myalgias.  Skin: Negative.   Neurological:  Negative for dizziness and weakness.  Psychiatric/Behavioral:  Positive for confusion and dysphoric mood. Negative for sleep disturbance.     Immunization History  Administered Date(s) Administered   Fluad Quad(high Dose 65+) 10/28/2020   Influenza, High Dose Seasonal PF 10/21/2016, 09/24/2017, 10/25/2019   Influenza-Unspecified 10/03/2011, 10/04/2012, 01/13/2016   Moderna SARS-COV2 Booster Vaccination 11/21/2019, 06/11/2020   Moderna Sars-Covid-2 Vaccination 01/16/2019, 02/13/2019, 03/13/2019   Pneumococcal-Unspecified 01/13/2016   Tetanus 12/01/2017   Zoster Recombinat (Shingrix) 08/24/2018, 02/22/2020   Pertinent  Health Maintenance Due  Topic Date Due   HEMOGLOBIN A1C  12/01/2019   OPHTHALMOLOGY EXAM  05/30/2021   INFLUENZA VACCINE  08/12/2021   FOOT EXAM  10/17/2021   DEXA SCAN  Completed      05/25/2019    1:28 PM 09/12/2020    2:57 PM 10/17/2020    2:53 PM 02/07/2021   12:47 PM 06/19/2021   12:11 AM  Fall Risk  Falls in the past year? 0 0 0 1   Was there an injury with Fall?  0 0 0   Fall Risk Category Calculator  0 0 1   Fall Risk Category  Low Low Low   Patient Fall Risk Level  Low fall risk Low fall risk Moderate fall risk Moderate fall risk  Patient at Risk for Falls Due to  No Fall  Risks No  Fall Risks History of fall(s);Impaired balance/gait;Impaired mobility;Orthopedic patient   Patient at Risk for Falls Due to - Comments    spinal stenosis of lumbar   Fall risk Follow up  Falls evaluation completed Falls evaluation completed Falls evaluation completed;Education provided;Falls prevention discussed    Functional Status Survey:    Vitals:   06/24/21 1116  BP: 122/80  Pulse: 68  Resp: (!) 22  Temp: (!) 97.1 F (36.2 C)  SpO2: 93%  Weight: 185 lb 6.4 oz (84.1 kg)  Height: '5\' 7"'$  (1.702 m)   Body mass index is 29.04 kg/m. Physical Exam Vitals reviewed.  Constitutional:      Appearance: Normal appearance.  HENT:     Head: Normocephalic.     Nose: Nose normal.     Mouth/Throat:     Mouth: Mucous membranes are moist.     Pharynx: Oropharynx is clear.  Eyes:     Pupils: Pupils are equal, round, and reactive to light.  Cardiovascular:     Rate and Rhythm: Normal rate and regular rhythm.     Pulses: Normal pulses.     Heart sounds: Murmur heard.  Pulmonary:     Effort: Pulmonary effort is normal.     Breath sounds: Normal breath sounds.  Abdominal:     General: Abdomen is flat. Bowel sounds are normal.     Palpations: Abdomen is soft.  Musculoskeletal:     Cervical back: Neck supple.     Comments: Mild swelling bilateral  C/o Pain when move her Right shoulder Can raise it Passively  Skin:    General: Skin is warm.  Neurological:     General: No focal deficit present.     Mental Status: She is alert and oriented to person, place, and time.     Comments: Speech was normal   Psychiatric:        Mood and Affect: Mood normal.        Thought Content: Thought content normal.     Comments: Seemed depressed     Labs reviewed: Recent Labs    03/26/21 0000 04/30/21 0000 06/19/21 0006 06/19/21 0010  NA 140 140 140 141  K 4.4 4.2 4.1 4.0  CL 106 103 104 103  CO2 27* 26* 28  --   GLUCOSE  --   --  108* 106*  BUN '21 16 14 17  '$ CREATININE 0.9 0.9  0.85 0.80  CALCIUM 9.2 9.5 9.8  --    Recent Labs    12/02/20 0000 03/26/21 0000  AST 17 15  ALT 15 12  ALKPHOS 43 47  ALBUMIN 3.6 3.8   Recent Labs    12/02/20 0000 03/26/21 0000 05/01/21 0000 06/19/21 0006 06/19/21 0010  WBC 6.0 6.5 6.9 7.2  --   NEUTROABS 10.70 3,627.00  --  3.5  --   HGB 12.4 11.9* 11.7* 13.2 13.9  HCT 37 35* 35* 41.8 41.0  MCV  --   --   --  105.0*  --   PLT 248 226 221 216  --    Lab Results  Component Value Date   TSH 1.510 04/05/2020   Lab Results  Component Value Date   HGBA1C 5.9 (H) 05/31/2019   Lab Results  Component Value Date   CHOL 169 04/05/2020   HDL 70 04/05/2020   LDLCALC 74 04/05/2020   TRIG 150 (H) 04/05/2020   CHOLHDL 2.4 04/05/2020    Significant Diagnostic Results in last 30 days:  CT Head Wo Contrast  Result Date: 06/19/2021 CLINICAL DATA:  Fall, head and neck trauma. EXAM: CT HEAD WITHOUT CONTRAST CT CERVICAL SPINE WITHOUT CONTRAST TECHNIQUE: Multidetector CT imaging of the head and cervical spine was performed following the standard protocol without intravenous contrast. Multiplanar CT image reconstructions of the cervical spine were also generated. RADIATION DOSE REDUCTION: This exam was performed according to the departmental dose-optimization program which includes automated exposure control, adjustment of the mA and/or kV according to patient size and/or use of iterative reconstruction technique. COMPARISON:  None Available. FINDINGS: CT HEAD FINDINGS Brain: No acute intracranial hemorrhage, midline shift or mass effect. No extra-axial fluid collection. Diffuse atrophy is noted. Subcortical and periventricular white matter hypodensities are noted bilaterally. No hydrocephalus. An old lacunar infarct is noted in the basal ganglia on the left. Vascular: Atherosclerotic calcification of the carotid siphons. Atherosclerotic calcification and dolichoectasia of the basilar artery and left vertebral artery. Skull: Normal. Negative  for fracture or focal lesion. Sinuses/Orbits: Mild mucosal thickening in the ethmoid air cells bilaterally. No acute orbital abnormality. Other: Small scalp contusion over the parietal bone on the right. CT CERVICAL SPINE FINDINGS Alignment: There is mild anterolisthesis at C4-C5, C5-C6 and C6-C7. Skull base and vertebrae: No acute fracture. No primary bone lesion or focal pathologic process. Soft tissues and spinal canal: No prevertebral fluid or swelling. No visible canal hematoma. Disc levels: Multilevel intervertebral disc space narrowing, uncovertebral osteophyte formation, disc herniations, and facet arthropathy. Upper chest: Aortic atherosclerosis. Other: None. IMPRESSION: 1. No acute intracranial process. 2. Atrophy with chronic microvascular ischemic changes. 3. Small contusion over the parietal bone on the right. 4. Multilevel degenerative changes in the cervical spine without evidence of acute fracture. Electronically Signed   By: Brett Fairy M.D.   On: 06/19/2021 00:45   CT Cervical Spine Wo Contrast  Result Date: 06/19/2021 CLINICAL DATA:  Fall, head and neck trauma. EXAM: CT HEAD WITHOUT CONTRAST CT CERVICAL SPINE WITHOUT CONTRAST TECHNIQUE: Multidetector CT imaging of the head and cervical spine was performed following the standard protocol without intravenous contrast. Multiplanar CT image reconstructions of the cervical spine were also generated. RADIATION DOSE REDUCTION: This exam was performed according to the departmental dose-optimization program which includes automated exposure control, adjustment of the mA and/or kV according to patient size and/or use of iterative reconstruction technique. COMPARISON:  None Available. FINDINGS: CT HEAD FINDINGS Brain: No acute intracranial hemorrhage, midline shift or mass effect. No extra-axial fluid collection. Diffuse atrophy is noted. Subcortical and periventricular white matter hypodensities are noted bilaterally. No hydrocephalus. An old lacunar  infarct is noted in the basal ganglia on the left. Vascular: Atherosclerotic calcification of the carotid siphons. Atherosclerotic calcification and dolichoectasia of the basilar artery and left vertebral artery. Skull: Normal. Negative for fracture or focal lesion. Sinuses/Orbits: Mild mucosal thickening in the ethmoid air cells bilaterally. No acute orbital abnormality. Other: Small scalp contusion over the parietal bone on the right. CT CERVICAL SPINE FINDINGS Alignment: There is mild anterolisthesis at C4-C5, C5-C6 and C6-C7. Skull base and vertebrae: No acute fracture. No primary bone lesion or focal pathologic process. Soft tissues and spinal canal: No prevertebral fluid or swelling. No visible canal hematoma. Disc levels: Multilevel intervertebral disc space narrowing, uncovertebral osteophyte formation, disc herniations, and facet arthropathy. Upper chest: Aortic atherosclerosis. Other: None. IMPRESSION: 1. No acute intracranial process. 2. Atrophy with chronic microvascular ischemic changes. 3. Small contusion over the parietal bone on the right. 4. Multilevel degenerative changes in the cervical spine without evidence of acute fracture. Electronically Signed  By: Brett Fairy M.D.   On: 06/19/2021 00:45    Assessment/Plan 1. Confusion ? Etiology Labs few days ago were normal No Signs of infection Could be Zoloft or tramadol Will Discontinue Zoloft for now CT few days ago did not show anything acute Has appointment with Neurology  2. Moderate episode of recurrent major depressive disorder ([place]) Will discontinue Zoloft Will consider Wellbutrin in few d ays when her mental status is better  3. Weakness Not able to do her ADLS Transfer to SNF in next few days I have talked to Education officer, museum  4. Paroxysmal atrial fibrillation/flutter ([facility]) Pn Eliquis and Amiodarone and Toprol  5. Essential hypertension On Cozaar and Toprol  6. Acute pain of right shoulder Xray of the  shoulder Scheduled Tramadol Also on Robaxin PRn   Family/ staff Communication:   Labs/tests ordered:

## 2021-06-27 DIAGNOSIS — R262 Difficulty in walking, not elsewhere classified: Secondary | ICD-10-CM | POA: Diagnosis not present

## 2021-06-27 DIAGNOSIS — R2689 Other abnormalities of gait and mobility: Secondary | ICD-10-CM | POA: Diagnosis not present

## 2021-06-27 DIAGNOSIS — R29898 Other symptoms and signs involving the musculoskeletal system: Secondary | ICD-10-CM | POA: Diagnosis not present

## 2021-06-27 DIAGNOSIS — N189 Chronic kidney disease, unspecified: Secondary | ICD-10-CM | POA: Diagnosis not present

## 2021-06-27 DIAGNOSIS — Z9181 History of falling: Secondary | ICD-10-CM | POA: Diagnosis not present

## 2021-06-27 DIAGNOSIS — R41841 Cognitive communication deficit: Secondary | ICD-10-CM | POA: Diagnosis not present

## 2021-06-27 DIAGNOSIS — I129 Hypertensive chronic kidney disease with stage 1 through stage 4 chronic kidney disease, or unspecified chronic kidney disease: Secondary | ICD-10-CM | POA: Diagnosis not present

## 2021-06-27 DIAGNOSIS — R1312 Dysphagia, oropharyngeal phase: Secondary | ICD-10-CM | POA: Diagnosis not present

## 2021-06-27 DIAGNOSIS — M6281 Muscle weakness (generalized): Secondary | ICD-10-CM | POA: Diagnosis not present

## 2021-06-29 DIAGNOSIS — R2689 Other abnormalities of gait and mobility: Secondary | ICD-10-CM | POA: Diagnosis not present

## 2021-06-29 DIAGNOSIS — I129 Hypertensive chronic kidney disease with stage 1 through stage 4 chronic kidney disease, or unspecified chronic kidney disease: Secondary | ICD-10-CM | POA: Diagnosis not present

## 2021-06-29 DIAGNOSIS — R262 Difficulty in walking, not elsewhere classified: Secondary | ICD-10-CM | POA: Diagnosis not present

## 2021-06-29 DIAGNOSIS — R41841 Cognitive communication deficit: Secondary | ICD-10-CM | POA: Diagnosis not present

## 2021-06-29 DIAGNOSIS — M6281 Muscle weakness (generalized): Secondary | ICD-10-CM | POA: Diagnosis not present

## 2021-06-29 DIAGNOSIS — R1312 Dysphagia, oropharyngeal phase: Secondary | ICD-10-CM | POA: Diagnosis not present

## 2021-06-30 ENCOUNTER — Non-Acute Institutional Stay (SKILLED_NURSING_FACILITY): Payer: Medicare Other | Admitting: Adult Health

## 2021-06-30 ENCOUNTER — Other Ambulatory Visit: Payer: Self-pay | Admitting: Adult Health

## 2021-06-30 ENCOUNTER — Encounter: Payer: Self-pay | Admitting: Adult Health

## 2021-06-30 ENCOUNTER — Encounter: Payer: Self-pay | Admitting: Internal Medicine

## 2021-06-30 DIAGNOSIS — F5101 Primary insomnia: Secondary | ICD-10-CM | POA: Diagnosis not present

## 2021-06-30 DIAGNOSIS — R262 Difficulty in walking, not elsewhere classified: Secondary | ICD-10-CM | POA: Diagnosis not present

## 2021-06-30 DIAGNOSIS — I480 Paroxysmal atrial fibrillation: Secondary | ICD-10-CM | POA: Diagnosis not present

## 2021-06-30 DIAGNOSIS — R531 Weakness: Secondary | ICD-10-CM | POA: Diagnosis not present

## 2021-06-30 DIAGNOSIS — R1312 Dysphagia, oropharyngeal phase: Secondary | ICD-10-CM | POA: Diagnosis not present

## 2021-06-30 DIAGNOSIS — M159 Polyosteoarthritis, unspecified: Secondary | ICD-10-CM

## 2021-06-30 DIAGNOSIS — I10 Essential (primary) hypertension: Secondary | ICD-10-CM | POA: Diagnosis not present

## 2021-06-30 DIAGNOSIS — I129 Hypertensive chronic kidney disease with stage 1 through stage 4 chronic kidney disease, or unspecified chronic kidney disease: Secondary | ICD-10-CM | POA: Diagnosis not present

## 2021-06-30 DIAGNOSIS — R41841 Cognitive communication deficit: Secondary | ICD-10-CM | POA: Diagnosis not present

## 2021-06-30 DIAGNOSIS — R2689 Other abnormalities of gait and mobility: Secondary | ICD-10-CM | POA: Diagnosis not present

## 2021-06-30 DIAGNOSIS — M6281 Muscle weakness (generalized): Secondary | ICD-10-CM | POA: Diagnosis not present

## 2021-06-30 MED ORDER — TRAMADOL HCL 50 MG PO TABS: 50.0000 mg | ORAL_TABLET | Freq: Four times a day (QID) | ORAL | 0 refills | Status: DC | PRN

## 2021-06-30 NOTE — Progress Notes (Unsigned)
This encounter was created in error - please disregard.

## 2021-06-30 NOTE — Progress Notes (Unsigned)
Location:  [facility] Room Number: NO/34A Place of Service:  SNF (31) Provider:  Durenda Age, DNP, FNP-BC  Patient Care Team: Virgie Dad, MD as PCP - General (Internal Medicine) Leonie Man, MD as PCP - Cardiology (Cardiology) Mast, [name], NP as Nurse Practitioner (Internal Medicine) Lavonna Monarch, MD as Consulting Physician (Dermatology)  Extended Emergency Contact Information Primary Emergency Contact: Marrero Mobile Phone: [phone] Relation: Daughter Secondary Emergency Contact: Weeks,Tiffany Mobile Phone: [phone] Relation: Daughter Preferred language: English Interpreter needed? No  Code Status:  DNR  Goals of care: Advanced Directive information    06/30/2021    2:26 PM  Advanced Directives  Does Patient Have a Medical Advance Directive? Yes  Type of Paramedic of [place];Out of facility DNR (pink MOST or yellow form);Living will  Does patient want to make changes to medical advance directive? No - Patient declined  Copy of [place] in Chart? Yes - validated most recent copy scanned in chart (See row information)  Pre-existing out of facility DNR order (yellow form or pink MOST form) Yellow form placed in chart (order not valid for inpatient use);Pink MOST form placed in chart (order not valid for inpatient use)     Chief Complaint  Patient presents with   Acute Visit    Patient is being seen for insomnia    HPI:  Stephanie Shea is a 88 y.o. female seen today for medical management of chronic diseases.  ***   Past Medical History:  Diagnosis Date   Atherosclerosis of native coronary artery 2001   LAD & Cx disease -- BMS PCI in 2001; CABG 2002.  PCI in 2006 (per-op TAH)   Atrial fibrillation and flutter ([place]) 04/2019   New diagnosis-major complaint was chest tightness and dyspnea.   Breast cancer ([place])    Cervix dysplasia    GERD (gastroesophageal reflux disease)     Heart attack ([place]) 2002, 2006   Both events reportedly were perioperatively. She has no recollection. Initial PCI was related to angina symptoms (bilateral arm pain)   High cholesterol    Hypertension    Lumbosacral spinal stenosis    Osteoarthritis    Skin cancer    Sleep apnea    Uses CPAP faithfully   Squamous cell carcinoma of skin 02/12/2020   in situ- left neck-anterior (CX35FU)   Past Surgical History:  Procedure Laterality Date   ABDOMINAL HYSTERECTOMY     bone density     CARDIAC CATHETERIZATION  2001    Dr. Glade Lloyd [facility]) 2 vessel CAD involving LAD-D1 and distal Cx-OM. Appearance of dilated tapered left main with no significant atherosclerosis on IVUS --  BMS PCI of pLAD ([facility] BMS 3.0 mm x 15 mm) & with PTCA of ostial D1, PTCA of dLAD.  BMS PCI pCx [facility] BMS 3.5 mm x 11 mm) - unable to reopen dCx-OM (probable distal Atheroembolic occlusion.  Normal LVEF.   COLONOSCOPY     CORONARY ARTERY BYPASS GRAFT  2002   Unsure of details [facility] in [facility])   [place]  2001   ([place]) [facility] BMS PCI pLAD (3.0 mm x 15 mm) -PTCA of jailed D1 as well as distal LAD; pCx ([facility] BMS 3.5 mm x 11 mm) -unable to open distalCx-OM -thromboembolic occlusion   DIAGNOSTIC MAMMOGRAM     LEFT HEART CATH AND CORS/GRAFTS ANGIOGRAPHY N/A 04/25/2019   Procedure: LEFT HEART CATH AND CORS/GRAFTS ANGIOGRAPHY;  Surgeon: Troy Sine,  MD;  Location: [facility] INVASIVE [facility] LAB;;; ost-prox LCx stent @ OM1 ostium =100% CTO, Ost-prox LAD stent ~30% ISR, Ost D1 CTO, prox RCA ~30%. SCG-OM2 patent with ostial stent 30%, LIMA-LAD patent; CTO of SVG-D1.    left knee replacement     MASTECTOMY     NM MYOVIEW LTD  12/2014; 2018   Cardiologist (Dr. Kellie Simmering.  [place], [place] ph# [phone]) -->LEXISCAN: Nonischemic, "normal "   pap smear     tonsilectomy     TRANSTHORACIC ECHOCARDIOGRAM  12/2014   Echocardiogram 12/25/14- normal LVEF and systolic function, EF 17-51%, mild grade 1  diastolic dysfunction   TRANSTHORACIC ECHOCARDIOGRAM  04/24/2019   EF 65 to 70%.  Moderate septal LVH.  GR 1 DD-elevated EDP.  Normal PAP.  Relatively normal valves.  No aortic stenosis.    Allergies  Allergen Reactions   Codeine Nausea Only    Outpatient Encounter Medications as of 06/30/2021  Medication Sig   acetaminophen (TYLENOL) 500 MG tablet Take 2 tablets (1,000 mg total) by mouth 2 (two) times daily.   amiodarone (PACERONE) 100 MG tablet Take 100 mg by mouth daily.   ammonium lactate (AMLACTIN) 12 % cream Apply topically as needed for dry skin.   apixaban (ELIQUIS) 5 MG TABS tablet Take 1 tablet (5 mg total) by mouth 2 (two) times daily.   atorvastatin (LIPITOR) 80 MG tablet Take 1 tablet (80 mg total) by mouth daily.   Cholecalciferol (VITAMIN D3) 50 MCG (2000 UT) TABS Take 2,000 Units by mouth daily after breakfast.   docusate sodium (COLACE) 100 MG capsule Take 1 capsule (100 mg total) by mouth daily.   fexofenadine (ALLEGRA) 180 MG tablet Take 1 tablet (180 mg total) by mouth daily as needed for allergies or rhinitis.   fluticasone (FLONASE) 50 MCG/ACT nasal spray Place 1 spray into both nostrils daily as needed for allergies or rhinitis.   losartan (COZAAR) 25 MG tablet Take 25 mg by mouth daily.   methocarbamol (ROBAXIN) 500 MG tablet Take 250 mg by mouth as needed.   metoprolol succinate (TOPROL-XL) 50 MG 24 hr tablet Take 1 tablet (50 mg total) by mouth daily. Take with or immediately following a meal.   nitroGLYCERIN (NITROSTAT) 0.4 MG SL tablet Place 0.4 mg under the tongue as needed for chest pain (Every 5 minutes x 3 doses).   omeprazole (PRILOSEC OTC) 20 MG tablet Take 1 tablet (20 mg total) by mouth daily as needed.   polyethylene glycol (MIRALAX / GLYCOLAX) 17 g packet Take 17 g by mouth daily. Give with 4 oz of water.   senna-docusate (SENOKOT-S) 8.6-50 MG tablet Take 2 tablets by mouth at bedtime.   traMADol (ULTRAM) 50 MG tablet Take 1 tablet (50 mg total) by  mouth every 6 (six) hours as needed.   zinc oxide 20 % ointment Apply 1 application topically 3 (three) times daily as needed for irritation. Apply to buttocks after every incontinent episode and as needed for redness.   No facility-administered encounter medications on file as of 06/30/2021.    Review of Systems ***    Immunization History  Administered Date(s) Administered   Fluad Quad(high Dose 65+) 10/28/2020   Influenza, High Dose Seasonal PF 10/21/2016, 09/24/2017, 10/25/2019   Influenza-Unspecified 10/03/2011, 10/04/2012, 01/13/2016   Moderna SARS-COV2 Booster Vaccination 11/21/2019, 06/11/2020   Moderna Sars-Covid-2 Vaccination 01/16/2019, 02/13/2019, 03/13/2019   Pneumococcal-Unspecified 01/13/2016   Tetanus 12/01/2017   Zoster Recombinat (Shingrix) 08/24/2018, 02/22/2020   Pertinent  Health Maintenance Due  Topic  Date Due   HEMOGLOBIN A1C  12/01/2019   OPHTHALMOLOGY EXAM  05/30/2021   INFLUENZA VACCINE  08/12/2021   FOOT EXAM  10/17/2021   DEXA SCAN  Completed      05/25/2019    1:28 PM 09/12/2020    2:57 PM 10/17/2020    2:53 PM 02/07/2021   12:47 PM 06/19/2021   12:11 AM  Fall Risk  Falls in the past year? 0 0 0 1   Was there an injury with Fall?  0 0 0   Fall Risk Category Calculator  0 0 1   Fall Risk Category  Low Low Low   Patient Fall Risk Level  Low fall risk Low fall risk Moderate fall risk Moderate fall risk  Patient at Risk for Falls Due to  No Fall Risks No Fall Risks History of fall(s);Impaired balance/gait;Impaired mobility;Orthopedic patient   Patient at Risk for Falls Due to - Comments    spinal stenosis of lumbar   Fall risk Follow up  Falls evaluation completed Falls evaluation completed Falls evaluation completed;Education provided;Falls prevention discussed      Vitals:   06/30/21 1440  BP: (!) 160/80  Pulse: 69  Resp: 20  Temp: 97.7 F (36.5 C)  SpO2: 92%  Weight: 185 lb (83.9 kg)  Height: '5\' 7"'$  (1.702 m)   Body mass index is 28.98  kg/m.  Physical Exam     Labs reviewed: Recent Labs    03/26/21 0000 04/30/21 0000 06/19/21 0006 06/19/21 0010  NA 140 140 140 141  K 4.4 4.2 4.1 4.0  CL 106 103 104 103  CO2 27* 26* 28  --   GLUCOSE  --   --  108* 106*  BUN '21 16 14 17  '$ CREATININE 0.9 0.9 0.85 0.80  CALCIUM 9.2 9.5 9.8  --    Recent Labs    12/02/20 0000 03/26/21 0000  AST 17 15  ALT 15 12  ALKPHOS 43 47  ALBUMIN 3.6 3.8   Recent Labs    12/02/20 0000 03/26/21 0000 05/01/21 0000 06/19/21 0006 06/19/21 0010  WBC 6.0 6.5 6.9 7.2  --   NEUTROABS 10.70 3,627.00  --  3.5  --   HGB 12.4 11.9* 11.7* 13.2 13.9  HCT 37 35* 35* 41.8 41.0  MCV  --   --   --  105.0*  --   PLT 248 226 221 216  --    Lab Results  Component Value Date   TSH 1.510 04/05/2020   Lab Results  Component Value Date   HGBA1C 5.9 (H) 05/31/2019   Lab Results  Component Value Date   CHOL 169 04/05/2020   HDL 70 04/05/2020   LDLCALC 74 04/05/2020   TRIG 150 (H) 04/05/2020   CHOLHDL 2.4 04/05/2020    Significant Diagnostic Results in last 30 days:  CT Head Wo Contrast  Result Date: 06/19/2021 CLINICAL DATA:  Fall, head and neck trauma. EXAM: CT HEAD WITHOUT CONTRAST CT CERVICAL SPINE WITHOUT CONTRAST TECHNIQUE: Multidetector CT imaging of the head and cervical spine was performed following the standard protocol without intravenous contrast. Multiplanar CT image reconstructions of the cervical spine were also generated. RADIATION DOSE REDUCTION: This exam was performed according to the departmental dose-optimization program which includes automated exposure control, adjustment of the mA and/or kV according to patient size and/or use of iterative reconstruction technique. COMPARISON:  None Available. FINDINGS: CT HEAD FINDINGS Brain: No acute intracranial hemorrhage, midline shift or mass effect. No extra-axial fluid collection.  Diffuse atrophy is noted. Subcortical and periventricular white matter hypodensities are noted  bilaterally. No hydrocephalus. An old lacunar infarct is noted in the basal ganglia on the left. Vascular: Atherosclerotic calcification of the carotid siphons. Atherosclerotic calcification and dolichoectasia of the basilar artery and left vertebral artery. Skull: Normal. Negative for fracture or focal lesion. Sinuses/Orbits: Mild mucosal thickening in the ethmoid air cells bilaterally. No acute orbital abnormality. Other: Small scalp contusion over the parietal bone on the right. CT CERVICAL SPINE FINDINGS Alignment: There is mild anterolisthesis at C4-C5, C5-C6 and C6-C7. Skull base and vertebrae: No acute fracture. No primary bone lesion or focal pathologic process. Soft tissues and spinal canal: No prevertebral fluid or swelling. No visible canal hematoma. Disc levels: Multilevel intervertebral disc space narrowing, uncovertebral osteophyte formation, disc herniations, and facet arthropathy. Upper chest: Aortic atherosclerosis. Other: None. IMPRESSION: 1. No acute intracranial process. 2. Atrophy with chronic microvascular ischemic changes. 3. Small contusion over the parietal bone on the right. 4. Multilevel degenerative changes in the cervical spine without evidence of acute fracture. Electronically Signed   By: Brett Fairy M.D.   On: 06/19/2021 00:45   CT Cervical Spine Wo Contrast  Result Date: 06/19/2021 CLINICAL DATA:  Fall, head and neck trauma. EXAM: CT HEAD WITHOUT CONTRAST CT CERVICAL SPINE WITHOUT CONTRAST TECHNIQUE: Multidetector CT imaging of the head and cervical spine was performed following the standard protocol without intravenous contrast. Multiplanar CT image reconstructions of the cervical spine were also generated. RADIATION DOSE REDUCTION: This exam was performed according to the departmental dose-optimization program which includes automated exposure control, adjustment of the mA and/or kV according to patient size and/or use of iterative reconstruction technique. COMPARISON:  None  Available. FINDINGS: CT HEAD FINDINGS Brain: No acute intracranial hemorrhage, midline shift or mass effect. No extra-axial fluid collection. Diffuse atrophy is noted. Subcortical and periventricular white matter hypodensities are noted bilaterally. No hydrocephalus. An old lacunar infarct is noted in the basal ganglia on the left. Vascular: Atherosclerotic calcification of the carotid siphons. Atherosclerotic calcification and dolichoectasia of the basilar artery and left vertebral artery. Skull: Normal. Negative for fracture or focal lesion. Sinuses/Orbits: Mild mucosal thickening in the ethmoid air cells bilaterally. No acute orbital abnormality. Other: Small scalp contusion over the parietal bone on the right. CT CERVICAL SPINE FINDINGS Alignment: There is mild anterolisthesis at C4-C5, C5-C6 and C6-C7. Skull base and vertebrae: No acute fracture. No primary bone lesion or focal pathologic process. Soft tissues and spinal canal: No prevertebral fluid or swelling. No visible canal hematoma. Disc levels: Multilevel intervertebral disc space narrowing, uncovertebral osteophyte formation, disc herniations, and facet arthropathy. Upper chest: Aortic atherosclerosis. Other: None. IMPRESSION: 1. No acute intracranial process. 2. Atrophy with chronic microvascular ischemic changes. 3. Small contusion over the parietal bone on the right. 4. Multilevel degenerative changes in the cervical spine without evidence of acute fracture. Electronically Signed   By: Brett Fairy M.D.   On: 06/19/2021 00:45    Assessment/Plan ***   Family/ staff Communication: Discussed plan of care with resident and charge nurse  Labs/tests ordered:     Durenda Age, DNP, MSN, FNP-BC [facility] and [facility] [phone] (Monday-Friday 8:00 a.m. - 5:00 p.m.) [phone] (after hours)

## 2021-07-01 ENCOUNTER — Encounter: Payer: Self-pay | Admitting: Internal Medicine

## 2021-07-01 ENCOUNTER — Non-Acute Institutional Stay (SKILLED_NURSING_FACILITY): Payer: Medicare Other | Admitting: Internal Medicine

## 2021-07-01 DIAGNOSIS — R2689 Other abnormalities of gait and mobility: Secondary | ICD-10-CM | POA: Diagnosis not present

## 2021-07-01 DIAGNOSIS — R531 Weakness: Secondary | ICD-10-CM

## 2021-07-01 DIAGNOSIS — M6281 Muscle weakness (generalized): Secondary | ICD-10-CM | POA: Diagnosis not present

## 2021-07-01 DIAGNOSIS — F331 Major depressive disorder, recurrent, moderate: Secondary | ICD-10-CM | POA: Diagnosis not present

## 2021-07-01 DIAGNOSIS — N183 Chronic kidney disease, stage 3 unspecified: Secondary | ICD-10-CM | POA: Diagnosis not present

## 2021-07-01 DIAGNOSIS — R262 Difficulty in walking, not elsewhere classified: Secondary | ICD-10-CM | POA: Diagnosis not present

## 2021-07-01 DIAGNOSIS — R410 Disorientation, unspecified: Secondary | ICD-10-CM

## 2021-07-01 DIAGNOSIS — I129 Hypertensive chronic kidney disease with stage 1 through stage 4 chronic kidney disease, or unspecified chronic kidney disease: Secondary | ICD-10-CM | POA: Diagnosis not present

## 2021-07-01 DIAGNOSIS — I10 Essential (primary) hypertension: Secondary | ICD-10-CM | POA: Diagnosis not present

## 2021-07-01 DIAGNOSIS — F5101 Primary insomnia: Secondary | ICD-10-CM | POA: Diagnosis not present

## 2021-07-01 DIAGNOSIS — M159 Polyosteoarthritis, unspecified: Secondary | ICD-10-CM | POA: Diagnosis not present

## 2021-07-01 DIAGNOSIS — R41841 Cognitive communication deficit: Secondary | ICD-10-CM | POA: Diagnosis not present

## 2021-07-01 DIAGNOSIS — Z23 Encounter for immunization: Secondary | ICD-10-CM | POA: Diagnosis not present

## 2021-07-01 DIAGNOSIS — I480 Paroxysmal atrial fibrillation: Secondary | ICD-10-CM | POA: Diagnosis not present

## 2021-07-01 DIAGNOSIS — R1312 Dysphagia, oropharyngeal phase: Secondary | ICD-10-CM | POA: Diagnosis not present

## 2021-07-01 NOTE — Progress Notes (Signed)
Provider:  Veleta Miners MD Location:   [facility] Room Number: 34 Place of Service:  SNF ([phone])  PCP: Virgie Dad, MD Patient Care Team: Virgie Dad, MD as PCP - General (Internal Medicine) Stephanie Man, MD as PCP - Cardiology (Cardiology) Mast, Shea X, NP as Nurse Practitioner (Internal Medicine) Lavonna Monarch, MD as Consulting Physician (Dermatology)  Extended Emergency Contact Information Primary Emergency Contact: Stephanie Shea Mobile Phone: [phone] Relation: Daughter Secondary Emergency Contact: Stephanie Shea Mobile Phone: [phone] Relation: Daughter Preferred language: English Interpreter needed? No  Code Status:  DNI Goals of Care: Advanced Directive information    07/01/2021    2:57 PM  Advanced Directives  Does Patient Have a Medical Advance Directive? Yes  Type of Paramedic of [place];Out of facility DNR (pink MOST or yellow form);Living will  Does patient want to make changes to medical advance directive? No - Patient declined  Copy of [place] in Chart? Yes - validated most recent copy scanned in chart (See row information)  Pre-existing out of facility DNR order (yellow form or pink MOST form) Yellow form placed in chart (order not valid for inpatient use);Pink MOST form placed in chart (order not valid for inpatient use)      Chief Complaint  Patient presents with   New Admit To SNF    Admission to SNF    HPI: Patient is a 88 y.o. female seen today for admission to  Past Medical History:  Diagnosis Date   Atherosclerosis of native coronary artery 2001   LAD & Cx disease -- BMS PCI in 2001; CABG 2002.  PCI in 2006 (per-op TAH)   Atrial fibrillation and flutter ([place]) 04/2019   New diagnosis-major complaint was chest tightness and dyspnea.   Breast cancer ([place])    Cervix dysplasia    GERD (gastroesophageal reflux disease)    Heart attack ([place]) 2002, 2006    Both events reportedly were perioperatively. She has no recollection. Initial PCI was related to angina symptoms (bilateral arm pain)   High cholesterol    Hypertension    Lumbosacral spinal stenosis    Osteoarthritis    Skin cancer    Sleep apnea    Uses CPAP faithfully   Squamous cell carcinoma of skin 02/12/2020   in situ- left neck-anterior (CX35FU)   Past Surgical History:  Procedure Laterality Date   ABDOMINAL HYSTERECTOMY     bone density     CARDIAC CATHETERIZATION  2001    Dr. Glade Lloyd [facility]) 2 vessel CAD involving LAD-D1 and distal Cx-OM. Appearance of dilated tapered left main with no significant atherosclerosis on IVUS --  BMS PCI of pLAD ([place] BMS 3.0 mm x 15 mm) & with PTCA of ostial D1, PTCA of dLAD.  BMS PCI pCx [facility] BMS 3.5 mm x 11 mm) - unable to reopen dCx-OM (probable distal Atheroembolic occlusion.  Normal LVEF.   COLONOSCOPY     CORONARY ARTERY BYPASS GRAFT  2002   Unsure of details [facility])   [place]  2001   ([place]) [place] BMS PCI pLAD (3.0 mm x 15 mm) -PTCA of jailed D1 as well as distal LAD; pCx ([place] BMS 3.5 mm x 11 mm) -unable to open distalCx-OM -thromboembolic occlusion   DIAGNOSTIC MAMMOGRAM     LEFT HEART CATH AND CORS/GRAFTS ANGIOGRAPHY N/A 04/25/2019   Procedure: LEFT HEART CATH AND CORS/GRAFTS ANGIOGRAPHY;  Surgeon: Troy Sine, MD;  Location: [facility]  INVASIVE CV LAB;;; ost-prox LCx stent @ OM1 ostium =100% CTO, Ost-prox LAD stent ~30% ISR, Ost D1 CTO, prox RCA ~30%. SCG-OM2 patent with ostial stent 30%, LIMA-LAD patent; CTO of SVG-D1.    left knee replacement     MASTECTOMY     NM MYOVIEW LTD  12/2014; 2018   Cardiologist (Dr. Kellie Simmering.  [place], [place] ph# [phone]) -->LEXISCAN: Nonischemic, "normal "   pap smear     tonsilectomy     TRANSTHORACIC ECHOCARDIOGRAM  12/2014   Echocardiogram 12/25/14- normal LVEF and systolic function, EF 28-41%, mild grade 1 diastolic dysfunction   TRANSTHORACIC  ECHOCARDIOGRAM  04/24/2019   EF 65 to 70%.  Moderate septal LVH.  GR 1 DD-elevated EDP.  Normal PAP.  Relatively normal valves.  No aortic stenosis.    reports that she has never smoked. She has never used smokeless tobacco. She reports that she does not drink alcohol and does not use drugs. Social History   Socioeconomic History   Marital status: Widowed    Spouse name: Not on file   Number of children: Not on file   Years of education: Not on file   Highest education level: Not on file  Occupational History   Not on file  Tobacco Use   Smoking status: Never   Smokeless tobacco: Never  Vaping Use   Vaping Use: Never used  Substance and Sexual Activity   Alcohol use: No   Drug use: No   Sexual activity: Not Currently  Other Topics Concern   Not on file  Social History Narrative   Recently moved back to [place] (lives alone in a [place] apartment at [facility])   Do you drink/eat things with caffeine? 2 cups of coffee every day   What year were you married? [place] - twice widowed   No pets.      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 Has POA      Stephanie Shea (Daughter) [phone])      Primary Emergency Contact: Perlmutter,Stephanie Shea   Address: [place],  [place] [address]   Home Phone: [phone]   Social Determinants of Health   Financial Resource Strain: Low Risk  (02/07/2021)   Overall Financial Resource Strain (CARDIA)    Difficulty of Paying Living Expenses: Not hard at all  Food Insecurity: No Food Insecurity (02/07/2021)   Hunger Vital Sign    Worried About Running Out of Food in the Last Year: Never true    Ran Out of Food in the Last Year: Never true  Transportation Needs: No Transportation Needs (02/07/2021)   PRAPARE - Hydrologist (Medical): No    Lack of Transportation (Non-Medical): No  Physical  Activity: Sufficiently Active (02/07/2021)   Exercise Vital Sign    Days of Exercise per Week: 5 days    Minutes of Exercise per Session: 30 min  Stress: No Stress Concern Present (02/07/2021)   [place]    Feeling of Stress : Only a little  Social Connections: Moderately Isolated (02/07/2021)   Social Connection and Isolation Panel [NHANES]    Frequency of Communication with Friends and Family: More than three times a week    Frequency of Social Gatherings with Friends and Family: More than three times a  week    Attends Religious Services: More than 4 times per year    Active Member of Clubs or Organizations: No    Attends Archivist Meetings: Never    Marital Status: Widowed  Intimate Partner Violence: Not At Risk (02/07/2021)   Humiliation, Afraid, Rape, and Kick questionnaire    Fear of Current or Ex-Partner: No    Emotionally Abused: No    Physically Abused: No    Sexually Abused: No    Functional Status Survey:    Family History  Problem Relation Age of Onset   Stroke Mother    Heart disease Father     Health Maintenance  Topic Date Due   Pneumonia Vaccine 65+ Years old (1 - PCV) 10/27/2000   HEMOGLOBIN A1C  12/01/2019   OPHTHALMOLOGY EXAM  05/30/2021   INFLUENZA VACCINE  08/12/2021   FOOT EXAM  10/17/2021   TETANUS/TDAP  12/02/2027   DEXA SCAN  Completed   Zoster Vaccines- Shingrix  Completed   HPV VACCINES  Aged Out   COVID-19 Vaccine  Discontinued    Allergies  Allergen Reactions   Codeine Nausea Only    Allergies as of 07/01/2021       Reactions   Codeine Nausea Only        Medication List        Accurate as of July 01, 2021  2:58 PM. If you have any questions, ask your nurse or doctor.          STOP taking these medications    ammonium lactate 12 % cream Commonly known as: AMLACTIN Stopped by: Virgie Dad, MD       TAKE these medications     acetaminophen 500 MG tablet Commonly known as: TYLENOL Take 2 tablets (1,000 mg total) by mouth 2 (two) times daily.   amiodarone 100 MG tablet Commonly known as: PACERONE Take 100 mg by mouth daily.   apixaban 5 MG Tabs tablet Commonly known as: Eliquis Take 1 tablet (5 mg total) by mouth 2 (two) times daily.   atorvastatin 80 MG tablet Commonly known as: LIPITOR Take 1 tablet (80 mg total) by mouth daily.   docusate sodium 100 MG capsule Commonly known as: COLACE Take 1 capsule (100 mg total) by mouth daily.   fexofenadine 180 MG tablet Commonly known as: ALLEGRA Take 1 tablet (180 mg total) by mouth daily as needed for allergies or rhinitis.   fluticasone 50 MCG/ACT nasal spray Commonly known as: FLONASE Place 1 spray into both nostrils daily as needed for allergies or rhinitis.   losartan 25 MG tablet Commonly known as: COZAAR Take 25 mg by mouth daily.   melatonin 5 MG Tabs Take 5 mg by mouth at bedtime.   methocarbamol 500 MG tablet Commonly known as: ROBAXIN Take 250 mg by mouth as needed.   metoprolol succinate 50 MG 24 hr tablet Commonly known as: TOPROL-XL Take 1 tablet (50 mg total) by mouth daily. Take with or immediately following a meal.   nitroGLYCERIN 0.4 MG SL tablet Commonly known as: NITROSTAT Place 0.4 mg under the tongue as needed for chest pain (Every 5 minutes x 3 doses).   nystatin powder Commonly known as: MYCOSTATIN/NYSTOP Apply 1 Application topically 2 (two) times daily.   omeprazole 20 MG tablet Commonly known as: PRILOSEC OTC Take 1 tablet (20 mg total) by mouth daily as needed.   polyethylene glycol 17 g packet Commonly known as: MIRALAX / GLYCOLAX Take 17 g by mouth  daily. Give with 4 oz of water.   senna-docusate 8.6-50 MG tablet Commonly known as: Senokot-S Take 2 tablets by mouth at bedtime.   traMADol 50 MG tablet Commonly known as: Ultram Take 1 tablet (50 mg total) by mouth every 6 (six) hours as needed.    Vitamin D3 50 MCG (2000 UT) Tabs Take 2,000 Units by mouth daily after breakfast.   zinc oxide 20 % ointment Apply 1 application topically 3 (three) times daily as needed for irritation. Apply to buttocks after every incontinent episode and as needed for redness.        Review of Systems  Vitals:   07/01/21 1447  BP: (!) 144/60  Pulse: 76  Resp: 18  Temp: (!) 97.3 F (36.3 C)  SpO2: 93%  Weight: 185 lb (83.9 kg)  Height: '5\' 7"'$  (1.702 m)   Body mass index is 28.98 kg/m. Physical Exam  Labs reviewed: Basic Metabolic Panel: Recent Labs    03/26/21 0000 04/30/21 0000 06/19/21 0006 06/19/21 0010  NA 140 140 140 141  K 4.4 4.2 4.1 4.0  CL 106 103 104 103  CO2 27* 26* 28  --   GLUCOSE  --   --  108* 106*  BUN '21 16 14 17  '$ CREATININE 0.9 0.9 0.85 0.80  CALCIUM 9.2 9.5 9.8  --    Liver Function Tests: Recent Labs    12/02/20 0000 03/26/21 0000  AST 17 15  ALT 15 12  ALKPHOS 43 47  ALBUMIN 3.6 3.8   No results for input(s): "LIPASE", "AMYLASE" in the last 8760 hours. No results for input(s): "AMMONIA" in the last 8760 hours. CBC: Recent Labs    12/02/20 0000 03/26/21 0000 05/01/21 0000 06/19/21 0006 06/19/21 0010  WBC 6.0 6.5 6.9 7.2  --   NEUTROABS 10.70 3,627.00  --  3.5  --   HGB 12.4 11.9* 11.7* 13.2 13.9  HCT 37 35* 35* 41.8 41.0  MCV  --   --   --  105.0*  --   PLT 248 226 221 216  --    Cardiac Enzymes: No results for input(s): "CKTOTAL", "CKMB", "CKMBINDEX", "TROPONINI" in the last 8760 hours. BNP: Invalid input(s): "POCBNP" Lab Results  Component Value Date   HGBA1C 5.9 (H) 05/31/2019   Lab Results  Component Value Date   TSH 1.510 04/05/2020   No results found for: "VITAMINB12" No results found for: "FOLATE" No results found for: "IRON", "TIBC", "FERRITIN"  Imaging and Procedures obtained prior to SNF admission: CT Head Wo Contrast  Result Date: 06/19/2021 CLINICAL DATA:  Fall, head and neck trauma. EXAM: CT HEAD WITHOUT  CONTRAST CT CERVICAL SPINE WITHOUT CONTRAST TECHNIQUE: Multidetector CT imaging of the head and cervical spine was performed following the standard protocol without intravenous contrast. Multiplanar CT image reconstructions of the cervical spine were also generated. RADIATION DOSE REDUCTION: This exam was performed according to the departmental dose-optimization program which includes automated exposure control, adjustment of the mA and/or kV according to patient size and/or use of iterative reconstruction technique. COMPARISON:  None Available. FINDINGS: CT HEAD FINDINGS Brain: No acute intracranial hemorrhage, midline shift or mass effect. No extra-axial fluid collection. Diffuse atrophy is noted. Subcortical and periventricular white matter hypodensities are noted bilaterally. No hydrocephalus. An old lacunar infarct is noted in the basal ganglia on the left. Vascular: Atherosclerotic calcification of the carotid siphons. Atherosclerotic calcification and dolichoectasia of the basilar artery and left vertebral artery. Skull: Normal. Negative for fracture or focal lesion. Sinuses/Orbits:  Mild mucosal thickening in the ethmoid air cells bilaterally. No acute orbital abnormality. Other: Small scalp contusion over the parietal bone on the right. CT CERVICAL SPINE FINDINGS Alignment: There is mild anterolisthesis at C4-C5, C5-C6 and C6-C7. Skull base and vertebrae: No acute fracture. No primary bone lesion or focal pathologic process. Soft tissues and spinal canal: No prevertebral fluid or swelling. No visible canal hematoma. Disc levels: Multilevel intervertebral disc space narrowing, uncovertebral osteophyte formation, disc herniations, and facet arthropathy. Upper chest: Aortic atherosclerosis. Other: None. IMPRESSION: 1. No acute intracranial process. 2. Atrophy with chronic microvascular ischemic changes. 3. Small contusion over the parietal bone on the right. 4. Multilevel degenerative changes in the cervical  spine without evidence of acute fracture. Electronically Signed   By: Brett Fairy M.D.   On: 06/19/2021 00:45   CT Cervical Spine Wo Contrast  Result Date: 06/19/2021 CLINICAL DATA:  Fall, head and neck trauma. EXAM: CT HEAD WITHOUT CONTRAST CT CERVICAL SPINE WITHOUT CONTRAST TECHNIQUE: Multidetector CT imaging of the head and cervical spine was performed following the standard protocol without intravenous contrast. Multiplanar CT image reconstructions of the cervical spine were also generated. RADIATION DOSE REDUCTION: This exam was performed according to the departmental dose-optimization program which includes automated exposure control, adjustment of the mA and/or kV according to patient size and/or use of iterative reconstruction technique. COMPARISON:  None Available. FINDINGS: CT HEAD FINDINGS Brain: No acute intracranial hemorrhage, midline shift or mass effect. No extra-axial fluid collection. Diffuse atrophy is noted. Subcortical and periventricular white matter hypodensities are noted bilaterally. No hydrocephalus. An old lacunar infarct is noted in the basal ganglia on the left. Vascular: Atherosclerotic calcification of the carotid siphons. Atherosclerotic calcification and dolichoectasia of the basilar artery and left vertebral artery. Skull: Normal. Negative for fracture or focal lesion. Sinuses/Orbits: Mild mucosal thickening in the ethmoid air cells bilaterally. No acute orbital abnormality. Other: Small scalp contusion over the parietal bone on the right. CT CERVICAL SPINE FINDINGS Alignment: There is mild anterolisthesis at C4-C5, C5-C6 and C6-C7. Skull base and vertebrae: No acute fracture. No primary bone lesion or focal pathologic process. Soft tissues and spinal canal: No prevertebral fluid or swelling. No visible canal hematoma. Disc levels: Multilevel intervertebral disc space narrowing, uncovertebral osteophyte formation, disc herniations, and facet arthropathy. Upper chest: Aortic  atherosclerosis. Other: None. IMPRESSION: 1. No acute intracranial process. 2. Atrophy with chronic microvascular ischemic changes. 3. Small contusion over the parietal bone on the right. 4. Multilevel degenerative changes in the cervical spine without evidence of acute fracture. Electronically Signed   By: Brett Fairy M.D.   On: 06/19/2021 00:45    Assessment/Plan There are no diagnoses linked to this encounter.   Family/ staff Communication:   Labs/tests ordered:

## 2021-07-01 NOTE — Progress Notes (Signed)
Provider:   Location:  [facility] Room Number: 43 Place of Service:  SNF (31)  PCP: Virgie Dad, MD Patient Care Team: Virgie Dad, MD as PCP - General (Internal Medicine) Leonie Man, MD as PCP - Cardiology (Cardiology) Mast, [name], NP as Nurse Practitioner (Internal Medicine) Lavonna Monarch, MD as Consulting Physician (Dermatology)  Extended Emergency Contact Information Primary Emergency Contact: Scotia Mobile Phone: [phone] Relation: Daughter Secondary Emergency Contact: Weeks,Tiffany Mobile Phone: [phone] Relation: Daughter Preferred language: Cleophus Molt Interpreter needed? No  Code Status:  Goals of Care: Advanced Directive information    07/01/2021    2:57 PM  Advanced Directives  Does Patient Have a Medical Advance Directive? Yes  Type of Paramedic of [place];Out of facility DNR (pink MOST or yellow form);Living will  Does patient want to make changes to medical advance directive? No - Patient declined  Copy of [place] in Chart? Yes - validated most recent copy scanned in chart (See row information)  Pre-existing out of facility DNR order (yellow form or pink MOST form) Yellow form placed in chart (order not valid for inpatient use);Pink MOST form placed in chart (order not valid for inpatient use)      Chief Complaint  Patient presents with   New Admit To SNF    Admission to SNF    HPI: Patient is a 88 y.o. female seen today for Readmission to SNF  Patient admitted to SNF as she is unable to Perform her ADLS in [place]  Patient has a history of CAD s/p CABG in 2002 S/p cath in 04/21 which showed occluded SVG to Diag but patent LIMA to LAD and SVG to CX.EF. On Medical Management EF 65% PAF diagnosed in 4/21 on Eliquis Sleep Apnea uses CPAP H/I Skin Cancer H/o Breast Cancer s/p Mastectomy  HTN,HLD and Arthritis Cognitive impairment LE Edema .Also had Spinal  Injection in 01/23   Patient has been noticed to have more confusion  Unable to do her ADLS Was unable to get up and go to bathroom It was decided to transfer her to SNF  Her daughter is saying patient is having hard time to sleep at night She keeps calling her daughter at night Monina started her on Melatonin yesterday she did have better night Patient did not have any complains today Mental status seemed at baseline She had more complains about Staff Mood seemed stable No Pain in her Right shoulder   Past Medical History:  Diagnosis Date   Atherosclerosis of native coronary artery 2001   LAD & Cx disease -- BMS PCI in 2001; CABG 2002.  PCI in 2006 (per-op [facility])   Atrial fibrillation and flutter ([place]) 04/2019   New diagnosis-major complaint was chest tightness and dyspnea.   Breast cancer ([place])    Cervix dysplasia    GERD (gastroesophageal reflux disease)    Heart attack ([facility] [place]) 2002, 2006   Both events reportedly were perioperatively. She has no recollection. Initial PCI was related to angina symptoms (bilateral arm pain)   High cholesterol    Hypertension    Lumbosacral spinal stenosis    Osteoarthritis    Skin cancer    Sleep apnea    Uses CPAP faithfully   Squamous cell carcinoma of skin 02/12/2020   in situ- left neck-anterior (CX35FU)   Past Surgical History:  Procedure Laterality Date   ABDOMINAL HYSTERECTOMY     bone density     CARDIAC CATHETERIZATION  2001    Dr. Glade Lloyd [facility]) 2 vessel CAD involving LAD-D1 and distal Cx-OM. Appearance of dilated tapered left main with no significant atherosclerosis on IVUS --  BMS PCI of pLAD ([facility] BMS 3.0 mm x 15 mm) & with PTCA of ostial D1, PTCA of dLAD.  BMS PCI pCx [facility] BMS 3.5 mm x 11 mm) - unable to reopen dCx-OM (probable distal Atheroembolic occlusion.  Normal LVEF.   COLONOSCOPY     CORONARY ARTERY BYPASS GRAFT  2002   Unsure of details [facility], in [place])   [place]  2001   ([facility]) [facility]ale BMS PCI pLAD (3.0 mm x 15 mm) -PTCA of jailed D1 as well as distal LAD; pCx ([facility]ale BMS 3.5 mm x 11 mm) -unable to open distalCx-OM -thromboembolic occlusion   DIAGNOSTIC MAMMOGRAM     LEFT HEART CATH AND CORS/GRAFTS ANGIOGRAPHY N/A 04/25/2019   Procedure: LEFT HEART CATH AND CORS/GRAFTS ANGIOGRAPHY;  Surgeon: Troy Sine, MD;  Location: [facility] INVASIVE [facility] LAB;;; ost-prox LCx stent @ OM1 ostium =100% CTO, Ost-prox LAD stent ~30% ISR, Ost D1 CTO, prox RCA ~30%. SCG-OM2 patent with ostial stent 30%, LIMA-LAD patent; CTO of SVG-D1.    left knee replacement     MASTECTOMY     NM MYOVIEW LTD  12/2014; 2018   Cardiologist (Dr. Kellie Simmering.  [place], [place] ph# [phone]) -->LEXISCAN: Nonischemic, "normal "   pap smear     tonsilectomy     TRANSTHORACIC ECHOCARDIOGRAM  12/2014   Echocardiogram 12/25/14- normal LVEF and systolic function, EF 62-95%, mild grade 1 diastolic dysfunction   TRANSTHORACIC ECHOCARDIOGRAM  04/24/2019   EF 65 to 70%.  Moderate septal LVH.  GR 1 DD-elevated EDP.  Normal PAP.  Relatively normal valves.  No aortic stenosis.    reports that she has never smoked. She has never used smokeless tobacco. She reports that she does not drink alcohol and does not use drugs. Social History   Socioeconomic History   Marital status: Widowed    Spouse name: Not on file   Number of children: Not on file   Years of education: Not on file   Highest education level: Not on file  Occupational History   Not on file  Tobacco Use   Smoking status: Never   Smokeless tobacco: Never  Vaping Use   Vaping Use: Never used  Substance and Sexual Activity   Alcohol use: No   Drug use: No   Sexual activity: Not Currently  Other Topics Concern   Not on file  Social History Narrative   Recently moved back to [place] (lives alone in a [address] apartment at [facility])   Do you drink/eat things with caffeine? 2 cups of coffee every day   What year were you married? [place]  - twice widowed   No pets.      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 Has POA      Johnny Bridge (Daughter) [phone])      Primary Emergency Contact: Hunzeker,JOHN L   Address: [place],  [place] [address]   Home Phone: [phone]   Social Determinants of Health   Financial Resource Strain: Low Risk  (02/07/2021)   Overall Financial Resource Strain (CARDIA)    Difficulty of Paying Living Expenses: Not hard at all  Food Insecurity: No Food Insecurity (02/07/2021)   Hunger Vital Sign    Worried About Running Out of Food in the Last Year: Never true    Ran Out of Food in the Last Year: Never true  Transportation Needs: No Transportation Needs (02/07/2021)   PRAPARE - Hydrologist (Medical): No    Lack of Transportation (Non-Medical): No  Physical Activity: Sufficiently Active (02/07/2021)   Exercise Vital Sign    Days of Exercise per Week: 5 days    Minutes of Exercise per Session: 30 min  Stress: No Stress Concern Present (02/07/2021)   [place]    Feeling of Stress : Only a little  Social Connections: Moderately Isolated (02/07/2021)   Social Connection and Isolation Panel [NHANES]    Frequency of Communication with Friends and Family: More than three times a week    Frequency of Social Gatherings with Friends and Family: More than three times a week    Attends Religious Services: More than 4 times per year    Active Member of [employer] or Organizations: No    Attends Archivist Meetings: Never    Marital Status: Widowed  Intimate Partner Violence: Not At Risk (02/07/2021)   Humiliation, Afraid, Rape, and Kick questionnaire    Fear of Current or Ex-Partner: No    Emotionally Abused: No    Physically Abused: No    Sexually Abused: No    Functional Status  Survey:    Family History  Problem Relation Age of Onset   Stroke Mother    Heart disease Father     Health Maintenance  Topic Date Due   Pneumonia Vaccine 24+ Years old (1 - PCV) 10/27/2000   HEMOGLOBIN A1C  12/01/2019   OPHTHALMOLOGY EXAM  05/30/2021   INFLUENZA VACCINE  08/12/2021   FOOT EXAM  10/17/2021   TETANUS/TDAP  12/02/2027   DEXA SCAN  Completed   Zoster Vaccines- Shingrix  Completed   HPV VACCINES  Aged Out   COVID-19 Vaccine  Discontinued    Allergies  Allergen Reactions   Codeine Nausea Only    Outpatient Encounter Medications as of 07/01/2021  Medication Sig   acetaminophen (TYLENOL) 500 MG tablet Take 2 tablets (1,000 mg total) by mouth 2 (two) times daily.   amiodarone (PACERONE) 100 MG tablet Take 100 mg by mouth daily.   apixaban (ELIQUIS) 5 MG TABS tablet Take 1 tablet (5 mg total) by mouth 2 (two) times daily.   atorvastatin (LIPITOR) 80 MG tablet Take 1 tablet (80 mg total) by mouth daily.   Cholecalciferol (VITAMIN D3) 50 MCG (2000 UT) TABS Take 2,000 Units by mouth daily after breakfast.   docusate sodium (COLACE) 100 MG capsule Take 1 capsule (100 mg total) by mouth daily.   fexofenadine (ALLEGRA) 180 MG tablet Take 1 tablet (180 mg total) by mouth daily as needed for allergies or rhinitis.   fluticasone (FLONASE) 50 MCG/ACT nasal spray Place 1 spray into both nostrils daily as needed for allergies or rhinitis.   losartan (COZAAR) 25 MG tablet Take 25 mg by mouth daily.   melatonin 5 MG TABS Take 5 mg by mouth at bedtime.   methocarbamol (ROBAXIN) 500 MG tablet Take 250 mg by mouth as needed.   metoprolol succinate (TOPROL-XL) 50 MG 24 hr tablet Take 1 tablet (50 mg total) by mouth daily. Take with or immediately following a meal.   nitroGLYCERIN (NITROSTAT) 0.4 MG SL  tablet Place 0.4 mg under the tongue as needed for chest pain (Every 5 minutes x 3 doses).   nystatin (MYCOSTATIN/NYSTOP) powder Apply 1 Application topically 2 (two) times daily.    omeprazole (PRILOSEC OTC) 20 MG tablet Take 1 tablet (20 mg total) by mouth daily as needed.   polyethylene glycol (MIRALAX / GLYCOLAX) 17 g packet Take 17 g by mouth daily. Give with 4 oz of water.   senna-docusate (SENOKOT-S) 8.6-50 MG tablet Take 2 tablets by mouth at bedtime.   traMADol (ULTRAM) 50 MG tablet Take 1 tablet (50 mg total) by mouth every 6 (six) hours as needed.   zinc oxide 20 % ointment Apply 1 application topically 3 (three) times daily as needed for irritation. Apply to buttocks after every incontinent episode and as needed for redness.   [DISCONTINUED] ammonium lactate (AMLACTIN) 12 % cream Apply topically as needed for dry skin.   No facility-administered encounter medications on file as of 07/01/2021.    Review of Systems  Constitutional:  Positive for activity change. Negative for appetite change.  HENT: Negative.    Respiratory:  Negative for cough and shortness of breath.   Cardiovascular:  Negative for leg swelling.  Gastrointestinal:  Negative for constipation.  Genitourinary: Negative.   Musculoskeletal:  Negative for arthralgias, gait problem and myalgias.  Skin: Negative.   Neurological:  Positive for weakness. Negative for dizziness.  Psychiatric/Behavioral:  Positive for confusion and sleep disturbance. Negative for dysphoric mood.     Vitals:   07/01/21 1447  BP: (!) 144/60  Pulse: 76  Resp: 18  Temp: (!) 97.3 F (36.3 C)  SpO2: 93%  Weight: 185 lb (83.9 kg)  Height: '5\' 7"'$  (1.702 m)   Body mass index is 28.98 kg/m. Physical Exam Vitals reviewed.  Constitutional:      Appearance: Normal appearance.  HENT:     Head: Normocephalic.     Nose: Nose normal.     Mouth/Throat:     Mouth: Mucous membranes are moist.     Pharynx: Oropharynx is clear.  Eyes:     Pupils: Pupils are equal, round, and reactive to light.  Cardiovascular:     Rate and Rhythm: Normal rate and regular rhythm.     Pulses: Normal pulses.     Heart sounds: Normal heart  sounds. No murmur heard. Pulmonary:     Effort: Pulmonary effort is normal.     Breath sounds: Normal breath sounds.  Abdominal:     General: Abdomen is flat. Bowel sounds are normal.     Palpations: Abdomen is soft.  Musculoskeletal:        General: No swelling.     Cervical back: Neck supple.  Skin:    General: Skin is warm.  Neurological:     General: No focal deficit present.     Mental Status: She is alert and oriented to person, place, and time.  Psychiatric:        Mood and Affect: Mood normal.        Thought Content: Thought content normal.    Labs reviewed: Basic Metabolic Panel: Recent Labs    03/26/21 0000 04/30/21 0000 06/19/21 0006 06/19/21 0010  NA 140 140 140 141  K 4.4 4.2 4.1 4.0  CL 106 103 104 103  CO2 27* 26* 28  --   GLUCOSE  --   --  108* 106*  BUN '21 16 14 17  '$ CREATININE 0.9 0.9 0.85 0.80  CALCIUM 9.2 9.5 9.8  --  Liver Function Tests: Recent Labs    12/02/20 0000 03/26/21 0000  AST 17 15  ALT 15 12  ALKPHOS 43 47  ALBUMIN 3.6 3.8   No results for input(s): "LIPASE", "AMYLASE" in the last 8760 hours. No results for input(s): "AMMONIA" in the last 8760 hours. CBC: Recent Labs    12/02/20 0000 03/26/21 0000 05/01/21 0000 06/19/21 0006 06/19/21 0010  WBC 6.0 6.5 6.9 7.2  --   NEUTROABS 10.70 3,627.00  --  3.5  --   HGB 12.4 11.9* 11.7* 13.2 13.9  HCT 37 35* 35* 41.8 41.0  MCV  --   --   --  105.0*  --   PLT 248 226 221 216  --    Cardiac Enzymes: No results for input(s): "CKTOTAL", "CKMB", "CKMBINDEX", "TROPONINI" in the last 8760 hours. BNP: Invalid input(s): "POCBNP" Lab Results  Component Value Date   HGBA1C 5.9 (H) 05/31/2019   Lab Results  Component Value Date   TSH 1.510 04/05/2020   No results found for: "VITAMINB12" No results found for: "FOLATE" No results found for: "IRON", "TIBC", "FERRITIN"  Imaging and Procedures obtained prior to SNF admission: CT Head Wo Contrast  Result Date: 06/19/2021 CLINICAL  DATA:  Fall, head and neck trauma. EXAM: CT HEAD WITHOUT CONTRAST CT CERVICAL SPINE WITHOUT CONTRAST TECHNIQUE: Multidetector CT imaging of the head and cervical spine was performed following the standard protocol without intravenous contrast. Multiplanar CT image reconstructions of the cervical spine were also generated. RADIATION DOSE REDUCTION: This exam was performed according to the departmental dose-optimization program which includes automated exposure control, adjustment of the mA and/or kV according to patient size and/or use of iterative reconstruction technique. COMPARISON:  None Available. FINDINGS: CT HEAD FINDINGS Brain: No acute intracranial hemorrhage, midline shift or mass effect. No extra-axial fluid collection. Diffuse atrophy is noted. Subcortical and periventricular white matter hypodensities are noted bilaterally. No hydrocephalus. An old lacunar infarct is noted in the basal ganglia on the left. Vascular: Atherosclerotic calcification of the carotid siphons. Atherosclerotic calcification and dolichoectasia of the basilar artery and left vertebral artery. Skull: Normal. Negative for fracture or focal lesion. Sinuses/Orbits: Mild mucosal thickening in the ethmoid air cells bilaterally. No acute orbital abnormality. Other: Small scalp contusion over the parietal bone on the right. CT CERVICAL SPINE FINDINGS Alignment: There is mild anterolisthesis at C4-C5, C5-C6 and C6-C7. Skull base and vertebrae: No acute fracture. No primary bone lesion or focal pathologic process. Soft tissues and spinal canal: No prevertebral fluid or swelling. No visible canal hematoma. Disc levels: Multilevel intervertebral disc space narrowing, uncovertebral osteophyte formation, disc herniations, and facet arthropathy. Upper chest: Aortic atherosclerosis. Other: None. IMPRESSION: 1. No acute intracranial process. 2. Atrophy with chronic microvascular ischemic changes. 3. Small contusion over the parietal bone on the  right. 4. Multilevel degenerative changes in the cervical spine without evidence of acute fracture. Electronically Signed   By: Brett Fairy M.D.   On: 06/19/2021 00:45   CT Cervical Spine Wo Contrast  Result Date: 06/19/2021 CLINICAL DATA:  Fall, head and neck trauma. EXAM: CT HEAD WITHOUT CONTRAST CT CERVICAL SPINE WITHOUT CONTRAST TECHNIQUE: Multidetector CT imaging of the head and cervical spine was performed following the standard protocol without intravenous contrast. Multiplanar CT image reconstructions of the cervical spine were also generated. RADIATION DOSE REDUCTION: This exam was performed according to the departmental dose-optimization program which includes automated exposure control, adjustment of the mA and/or kV according to patient size and/or use of iterative reconstruction technique.  COMPARISON:  None Available. FINDINGS: CT HEAD FINDINGS Brain: No acute intracranial hemorrhage, midline shift or mass effect. No extra-axial fluid collection. Diffuse atrophy is noted. Subcortical and periventricular white matter hypodensities are noted bilaterally. No hydrocephalus. An old lacunar infarct is noted in the basal ganglia on the left. Vascular: Atherosclerotic calcification of the carotid siphons. Atherosclerotic calcification and dolichoectasia of the basilar artery and left vertebral artery. Skull: Normal. Negative for fracture or focal lesion. Sinuses/Orbits: Mild mucosal thickening in the ethmoid air cells bilaterally. No acute orbital abnormality. Other: Small scalp contusion over the parietal bone on the right. CT CERVICAL SPINE FINDINGS Alignment: There is mild anterolisthesis at C4-C5, C5-C6 and C6-C7. Skull base and vertebrae: No acute fracture. No primary bone lesion or focal pathologic process. Soft tissues and spinal canal: No prevertebral fluid or swelling. No visible canal hematoma. Disc levels: Multilevel intervertebral disc space narrowing, uncovertebral osteophyte formation, disc  herniations, and facet arthropathy. Upper chest: Aortic atherosclerosis. Other: None. IMPRESSION: 1. No acute intracranial process. 2. Atrophy with chronic microvascular ischemic changes. 3. Small contusion over the parietal bone on the right. 4. Multilevel degenerative changes in the cervical spine without evidence of acute fracture. Electronically Signed   By: Brett Fairy M.D.   On: 06/19/2021 00:45    Assessment/Plan 1. Primary insomnia and Sleep apnea Per daughter she is still not sleeping well Calls her daughter in the middle of the night Melatonin has helped but will also add Ativan Prn if needed Daughter also trying to get her CPAP new mask so she can sleep better   2. Essential hypertension On Cozaar and Toprol  3. Generalized weakness She is now in SNF Plan to see Neurology Unable to do her transfers and ADLS  4. Paroxysmal atrial fibrillation/flutter ([facility]) On Eliquis and amiodarone  5. Osteoarthritis of multiple joints, unspecified osteoarthritis type Tylenol and tramadol  6. Confusion Zoloft Discontinued Has Appointment with Neurology  7. Moderate episode of recurrent major depressive disorder ([place]) Discontinued Zoloft for now as was worsening confusion per family  8. Stage 3 chronic kidney disease, unspecified whether stage 3a or 3b CKD ([place]) Creat stable    Family/ staff Communication:   Labs/tests ordered:

## 2021-07-02 DIAGNOSIS — R41841 Cognitive communication deficit: Secondary | ICD-10-CM | POA: Diagnosis not present

## 2021-07-02 DIAGNOSIS — R1312 Dysphagia, oropharyngeal phase: Secondary | ICD-10-CM | POA: Diagnosis not present

## 2021-07-02 DIAGNOSIS — R2689 Other abnormalities of gait and mobility: Secondary | ICD-10-CM | POA: Diagnosis not present

## 2021-07-02 DIAGNOSIS — M6281 Muscle weakness (generalized): Secondary | ICD-10-CM | POA: Diagnosis not present

## 2021-07-02 DIAGNOSIS — R262 Difficulty in walking, not elsewhere classified: Secondary | ICD-10-CM | POA: Diagnosis not present

## 2021-07-02 DIAGNOSIS — I129 Hypertensive chronic kidney disease with stage 1 through stage 4 chronic kidney disease, or unspecified chronic kidney disease: Secondary | ICD-10-CM | POA: Diagnosis not present

## 2021-07-03 ENCOUNTER — Encounter: Payer: Self-pay | Admitting: Neurology

## 2021-07-03 ENCOUNTER — Ambulatory Visit (INDEPENDENT_AMBULATORY_CARE_PROVIDER_SITE_OTHER): Payer: Medicare Other | Admitting: Neurology

## 2021-07-03 VITALS — BP 121/62 | HR 61 | Ht 67.0 in

## 2021-07-03 DIAGNOSIS — I6789 Other cerebrovascular disease: Secondary | ICD-10-CM

## 2021-07-03 DIAGNOSIS — I639 Cerebral infarction, unspecified: Secondary | ICD-10-CM

## 2021-07-03 DIAGNOSIS — R41841 Cognitive communication deficit: Secondary | ICD-10-CM | POA: Diagnosis not present

## 2021-07-03 DIAGNOSIS — I25119 Atherosclerotic heart disease of native coronary artery with unspecified angina pectoris: Secondary | ICD-10-CM | POA: Diagnosis not present

## 2021-07-03 DIAGNOSIS — I129 Hypertensive chronic kidney disease with stage 1 through stage 4 chronic kidney disease, or unspecified chronic kidney disease: Secondary | ICD-10-CM | POA: Diagnosis not present

## 2021-07-03 DIAGNOSIS — R1312 Dysphagia, oropharyngeal phase: Secondary | ICD-10-CM | POA: Diagnosis not present

## 2021-07-03 DIAGNOSIS — R531 Weakness: Secondary | ICD-10-CM | POA: Insufficient documentation

## 2021-07-03 DIAGNOSIS — R269 Unspecified abnormalities of gait and mobility: Secondary | ICD-10-CM

## 2021-07-03 DIAGNOSIS — R262 Difficulty in walking, not elsewhere classified: Secondary | ICD-10-CM | POA: Diagnosis not present

## 2021-07-03 DIAGNOSIS — R2689 Other abnormalities of gait and mobility: Secondary | ICD-10-CM | POA: Diagnosis not present

## 2021-07-03 DIAGNOSIS — M6281 Muscle weakness (generalized): Secondary | ICD-10-CM | POA: Diagnosis not present

## 2021-07-03 LAB — LIPID PANEL
Cholesterol: 175 (ref 0–200)
HDL: 59 (ref 35–70)
LDL Cholesterol: 91
Triglycerides: 146 (ref 40–160)

## 2021-07-03 LAB — TSH: TSH: 2.21 (ref 0.41–5.90)

## 2021-07-03 NOTE — Progress Notes (Signed)
Chief Complaint  Patient presents with   New Patient (Initial Visit)    Rm 17. Accompanied by son. NP Internal referral for dysarthria, concern for TIA or stroke. Pt reports recent loss of feeling in her tongue and numbness on the right side of her face. C/o difficulty eating. Denies any improvement.      ASSESSMENT AND PLAN  Stephanie Shea is a 88 y.o. female Acute onset of right tongue numbness, right arm weakness, in May 2023, Multiple falls since then  On examination, she has dysarthria, right inferior visual deficit, right arm weakness, significant gait abnormality  Multiple vascular risk factors, hypertension, hyperlipidemia, aging, atrial fibrillation, on Eliquis 5 mg twice a day  Most worrisome for left hemisphere stroke  MRI of the brain  Ultrasound of carotid artery  Echocardiogram  Memory loss  Laboratory evaluation to rule out treatable etiology  Return to clinic with nurse practitioner in 3 months   DIAGNOSTIC DATA (LABS, IMAGING, TESTING) - I reviewed patient records, labs, notes, testing and imaging myself where available.   MEDICAL HISTORY:  Stephanie Shea, is a 88 year old female, accompanied by her son, seen in request by her primary care doctor Veleta Miners for evaluation of slurred speech, frequent fall, initial evaluation was on July 03, 2021  I reviewed and summarized the referring note. PMHX. HTN A fib CAD, s/p CABG HLD OSA-CPAP History of right breast cancer, s/p lobectomy, chemotherapy, radiation therapy.  She moved to independent living at friend's home with her sister about 5 years ago, used to be fairly active, she suffered a major fall in November 2022, while stepping up to the bus she fell, landed on her knee,  After few months of rehabilitation, she was able to go to assisted living, walk using a walker,  About a month ago, she fell after getting up from a chair, then she developed worsening gait abnormality, she also noted to have  slurred speech, right tongue numbness, difficulty chewing, on today's examination, she was found to have memory loss, which has been slow worsening over the past couple years, MoCA examination 18/30, right inferior visual field deficit, right arm more than leg weakness      PHYSICAL EXAM:   Vitals:   07/03/21 1342  BP: 121/62  Pulse: 61  Height: '5\' 7"'$  (1.702 m)   Not recorded     Body mass index is 28.98 kg/m.  PHYSICAL EXAMNIATION:  Gen: NAD, conversant, well nourised, well groomed                     Cardiovascular: Regular rate rhythm, no peripheral edema, warm, nontender. Eyes: Conjunctivae clear without exudates or hemorrhage Neck: Supple, no carotid bruits. Pulmonary: Clear to auscultation bilaterally   NEUROLOGICAL EXAM:  MENTAL STATUS: Speech/cognition: Dysarthria, slow spastic slurred speech,    07/03/2021    2:57 PM  Montreal Cognitive Assessment   Visuospatial/ Executive (0/5) 3  Naming (0/3) 2  Attention: Read list of digits (0/2) 2  Attention: Read list of letters (0/1) 1  Attention: Serial 7 subtraction starting at 100 (0/3) 1  Language: Repeat phrase (0/2) 2  Language : Fluency (0/1) 1  Abstraction (0/2) 2  Delayed Recall (0/5) 0  Orientation (0/6) 4  Total 18    CRANIAL NERVES: CN II: Pupils are round equal and briskly reactive to light.  Right inferior visual field deficit CN III, IV, VI: extraocular movement are normal. No ptosis. CN V: Facial sensation is intact to light touch CN  VII: Face is symmetric with normal eye closure  CN VIII: Hearing is normal to causal conversation. CN IX, X: slow speech CN XI: Head turning and shoulder shrug are intact  MOTOR: Fixation of right arm upon rapid alternating movement, mild right leg drift  REFLEXES: Hyperreflexia at right upper extremity, limited at bilateral knee reflex due to bilateral knee pain  SENSORY: Intact to light touch, pinprick and vibratory sensation are intact in fingers and  toes.  COORDINATION: There is no trunk or limb dysmetria noted.  GAIT/STANCE: Need help to get up from seated position, antalgic, very unsteady  REVIEW OF SYSTEMS:  Full 14 system review of systems performed and notable only for as above All other review of systems were negative.   ALLERGIES: Allergies  Allergen Reactions   Codeine Nausea Only    HOME MEDICATIONS: Current Outpatient Medications  Medication Sig Dispense Refill   acetaminophen (TYLENOL) 500 MG tablet Take 2 tablets (1,000 mg total) by mouth 2 (two) times daily. 30 tablet 0   amiodarone (PACERONE) 100 MG tablet Take 100 mg by mouth daily.     apixaban (ELIQUIS) 5 MG TABS tablet Take 1 tablet (5 mg total) by mouth 2 (two) times daily. 30 tablet 0   atorvastatin (LIPITOR) 80 MG tablet Take 1 tablet (80 mg total) by mouth daily. 30 tablet 0   Cholecalciferol (VITAMIN D3) 50 MCG (2000 UT) TABS Take 2,000 Units by mouth daily after breakfast. 30 tablet 0   docusate sodium (COLACE) 100 MG capsule Take 1 capsule (100 mg total) by mouth daily. 30 capsule 0   fexofenadine (ALLEGRA) 180 MG tablet Take 1 tablet (180 mg total) by mouth daily as needed for allergies or rhinitis. 30 tablet 0   fluticasone (FLONASE) 50 MCG/ACT nasal spray Place 1 spray into both nostrils daily as needed for allergies or rhinitis. 30 mL 0   losartan (COZAAR) 25 MG tablet Take 25 mg by mouth daily.     melatonin 5 MG TABS Take 5 mg by mouth at bedtime.     methocarbamol (ROBAXIN) 500 MG tablet Take 250 mg by mouth as needed.     metoprolol succinate (TOPROL-XL) 50 MG 24 hr tablet Take 1 tablet (50 mg total) by mouth daily. Take with or immediately following a meal. 30 tablet 0   nitroGLYCERIN (NITROSTAT) 0.4 MG SL tablet Place 0.4 mg under the tongue as needed for chest pain (Every 5 minutes x 3 doses).     nystatin (MYCOSTATIN/NYSTOP) powder Apply 1 Application topically 2 (two) times daily.     omeprazole (PRILOSEC OTC) 20 MG tablet Take 1 tablet  (20 mg total) by mouth daily as needed. 30 tablet 0   polyethylene glycol (MIRALAX / GLYCOLAX) 17 g packet Take 17 g by mouth daily. Give with 4 oz of water. 30 each 0   senna-docusate (SENOKOT-S) 8.6-50 MG tablet Take 2 tablets by mouth at bedtime.     traMADol (ULTRAM) 50 MG tablet Take 1 tablet (50 mg total) by mouth every 6 (six) hours as needed. 60 tablet 0   zinc oxide 20 % ointment Apply 1 application topically 3 (three) times daily as needed for irritation. Apply to buttocks after every incontinent episode and as needed for redness. 30 g 0   No current facility-administered medications for this visit.    PAST MEDICAL HISTORY: Past Medical History:  Diagnosis Date   Atherosclerosis of native coronary artery 2001   LAD & Cx disease -- BMS PCI in 2001;  CABG 2002.  PCI in 2006 (per-op TAH)   Atrial fibrillation and flutter ([place]) 04/2019   New diagnosis-major complaint was chest tightness and dyspnea.   Breast cancer ([place])    Cervix dysplasia    GERD (gastroesophageal reflux disease)    Heart attack ([place]) 2002, 2006   Both events reportedly were perioperatively. She has no recollection. Initial PCI was related to angina symptoms (bilateral arm pain)   High cholesterol    Hypertension    Lumbosacral spinal stenosis    Osteoarthritis    Skin cancer    Sleep apnea    Uses CPAP faithfully   Squamous cell carcinoma of skin 02/12/2020   in situ- left neck-anterior (CX35FU)    PAST SURGICAL HISTORY: Past Surgical History:  Procedure Laterality Date   ABDOMINAL HYSTERECTOMY     bone density     CARDIAC CATHETERIZATION  2001    Dr. Glade Lloyd [facility]) 2 vessel CAD involving LAD-D1 and distal Cx-OM. Appearance of dilated tapered left main with no significant atherosclerosis on IVUS --  BMS PCI of pLAD ([facility] BMS 3.0 mm x 15 mm) & with PTCA of ostial D1, PTCA of dLAD.  BMS PCI pCx [facility] BMS 3.5 mm x 11 mm) - unable to reopen dCx-OM (probable distal Atheroembolic occlusion.  Normal  LVEF.   COLONOSCOPY     CORONARY ARTERY BYPASS GRAFT  2002   Unsure of details [facility] in [facility])   [place]  2001   ([place]) [facility] BMS PCI pLAD (3.0 mm x 15 mm) -PTCA of jailed D1 as well as distal LAD; pCx ([facility] BMS 3.5 mm x 11 mm) -unable to open distalCx-OM -thromboembolic occlusion   DIAGNOSTIC MAMMOGRAM     LEFT HEART CATH AND CORS/GRAFTS ANGIOGRAPHY N/A 04/25/2019   Procedure: LEFT HEART CATH AND CORS/GRAFTS ANGIOGRAPHY;  Surgeon: Troy Sine, MD;  Location: [facility] INV[facility]IVE CV LAB;;; ost-prox LCx stent @ OM1 ostium =100% CTO, Ost-prox LAD stent ~30% ISR, Ost D1 CTO, prox RCA ~30%. SCG-OM2 patent with ostial stent 30%, LIMA-LAD patent; CTO of SVG-D1.    left knee replacement     MASTECTOMY     NM MYOVIEW LTD  12/2014; 2018   Cardiologist (Dr. Kellie Simmering.  [place], [place] ph# [phone]) -->LEXISCAN: Nonischemic, "normal "   pap smear     tonsilectomy     TRANSTHORACIC ECHOCARDIOGRAM  12/2014   Echocardiogram 12/25/14- normal LVEF and systolic function, EF 41-32%, mild grade 1 diastolic dysfunction   TRANSTHORACIC ECHOCARDIOGRAM  04/24/2019   EF 65 to 70%.  Moderate septal LVH.  GR 1 DD-elevated EDP.  Normal PAP.  Relatively normal valves.  No aortic stenosis.    FAMILY HISTORY: Family History  Problem Relation Age of Onset   Stroke Mother    Heart disease Father     SOCIAL HISTORY: Social History   Socioeconomic History   Marital status: Widowed    Spouse name: Not on file   Number of children: Not on file   Years of education: Not on file   Highest education level: Not on file  Occupational History   Not on file  Tobacco Use   Smoking status: Never   Smokeless tobacco: Never  Vaping Use   Vaping Use: Never used  Substance and Sexual Activity   Alcohol use: No   Drug use: No   Sexual activity: Not Currently  Other Topics Concern   Not on file  Social History Narrative   Recently moved back to  [place] (lives alone in a  [place] apartment at [facility])   Do you drink/eat things with caffeine? 2 cups of coffee every day   What year were you married? [place] - twice widowed   No pets.      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 Has POA      Johnny Bridge (Daughter) [phone])      Primary Emergency Contact: Gasparini,JOHN L   Address: [place],  [place] [address]   Home Phone: [phone]   Social Determinants of Health   Financial Resource Strain: Low Risk  (02/07/2021)   Overall Financial Resource Strain (CARDIA)    Difficulty of Paying Living Expenses: Not hard at all  Food Insecurity: No Food Insecurity (02/07/2021)   Hunger Vital Sign    Worried About Running Out of Food in the Last Year: Never true    Ran Out of Food in the Last Year: Never true  Transportation Needs: No Transportation Needs (02/07/2021)   PRAPARE - Hydrologist (Medical): No    Lack of Transportation (Non-Medical): No  Physical Activity: Sufficiently Active (02/07/2021)   Exercise Vital Sign    Days of Exercise per Week: 5 days    Minutes of Exercise per Session: 30 min  Stress: No Stress Concern Present (02/07/2021)   [place]    Feeling of Stress : Only a little  Social Connections: Moderately Isolated (02/07/2021)   Social Connection and Isolation Panel [NHANES]    Frequency of Communication with Friends and Family: More than three times a week    Frequency of Social Gatherings with Friends and Family: More than three times a week    Attends Religious Services: More than 4 times per year    Active Member of [employer] or Organizations: No    Attends Archivist Meetings: Never    Marital Status: Widowed  Intimate Partner Violence: Not At Risk (02/07/2021)   Humiliation, Afraid, Rape, and Kick  questionnaire    Fear of Current or Ex-Partner: No    Emotionally Abused: No    Physically Abused: No    Sexually Abused: No      Marcial Pacas, M.D. Ph.D.  [facility] [address] [place], [place] [address] Ph: [phone] Fax: [phone]  CC:  Virgie Dad, MD [place],  [place] [phone]  Virgie Dad, MD

## 2021-07-04 ENCOUNTER — Non-Acute Institutional Stay (SKILLED_NURSING_FACILITY): Payer: Medicare Other | Admitting: Adult Health

## 2021-07-04 ENCOUNTER — Encounter: Payer: Self-pay | Admitting: Adult Health

## 2021-07-04 DIAGNOSIS — R262 Difficulty in walking, not elsewhere classified: Secondary | ICD-10-CM | POA: Diagnosis not present

## 2021-07-04 DIAGNOSIS — F320 Major depressive disorder, single episode, mild: Secondary | ICD-10-CM | POA: Diagnosis not present

## 2021-07-04 DIAGNOSIS — I129 Hypertensive chronic kidney disease with stage 1 through stage 4 chronic kidney disease, or unspecified chronic kidney disease: Secondary | ICD-10-CM | POA: Diagnosis not present

## 2021-07-04 DIAGNOSIS — I480 Paroxysmal atrial fibrillation: Secondary | ICD-10-CM | POA: Diagnosis not present

## 2021-07-04 DIAGNOSIS — F5101 Primary insomnia: Secondary | ICD-10-CM

## 2021-07-04 DIAGNOSIS — R41841 Cognitive communication deficit: Secondary | ICD-10-CM | POA: Diagnosis not present

## 2021-07-04 DIAGNOSIS — R2689 Other abnormalities of gait and mobility: Secondary | ICD-10-CM | POA: Diagnosis not present

## 2021-07-04 DIAGNOSIS — I10 Essential (primary) hypertension: Secondary | ICD-10-CM

## 2021-07-04 DIAGNOSIS — R1312 Dysphagia, oropharyngeal phase: Secondary | ICD-10-CM | POA: Diagnosis not present

## 2021-07-04 DIAGNOSIS — M6281 Muscle weakness (generalized): Secondary | ICD-10-CM | POA: Diagnosis not present

## 2021-07-04 LAB — RPR: RPR Ser Ql: NONREACTIVE

## 2021-07-04 LAB — VITAMIN B12: Vitamin B-12: 324 pg/mL (ref 232–1245)

## 2021-07-04 LAB — TSH: TSH: 1.39 u[IU]/mL (ref 0.450–4.500)

## 2021-07-04 NOTE — Progress Notes (Signed)
Location:  [facility] Room Number: [phone] Place of Service:  SNF (31) Provider:  Kenard Gower, DNP, FNP-BC  Patient Care Team: Mahlon Gammon, MD as PCP - General (Internal Medicine) Marykay Lex, MD as PCP - Cardiology (Cardiology) Mast, [name], NP as Nurse Practitioner (Internal Medicine) Janalyn Harder, MD as Consulting Physician (Dermatology)  Extended Emergency Contact Information Primary Emergency Contact: Johnson,Carrie Mobile Phone: [phone] Relation: Daughter Secondary Emergency Contact: Weeks,Tiffany Mobile Phone: [phone] Relation: Daughter Preferred language: English Interpreter needed? No  Code Status:  DNR  Goals of care: Advanced Directive information    07/04/2021    4:18 PM  Advanced Directives  Does Patient Have a Medical Advance Directive? Yes  Type of Estate agent of [place];Out of facility DNR (pink MOST or yellow form);Living will  Does patient want to make changes to medical advance directive? No - Patient declined  Copy of Healthcare Power of Attorney in Chart? Yes - validated most recent copy scanned in chart (See row information)  Pre-existing out of facility DNR order (yellow form or pink MOST form) Yellow form placed in chart (order not valid for inpatient use);Pink MOST form placed in chart (order not valid for inpatient use)     Chief Complaint  Patient presents with   Acute Visit    HPI:  Pt is a 88 y.o. female seen today for an acute visit regarding episodes of crying. She was recently [facility] from [facility] on 06/26/21.  Activity was reading a story and she started crying. When asked why she is crying, she answers "I'm fine." She was seen in her room today. She did not cry during the visit. She is currently having PT and OT. SBPs ranging from 114 to 140, with outlier 150 and 184. She takes losartan 25 mg daily.  She takes metoprolol succinate 50  mg daily and Eliquis 5 mg twice a day for PAF.   Past Medical History:  Diagnosis Date   Atherosclerosis of native coronary artery 2001   LAD & Cx disease -- BMS PCI in 2001; CABG 2002.  PCI in 2006 (per-op [facility])   Atrial fibrillation and flutter (HCC) 04/2019   New diagnosis-major complaint was chest tightness and dyspnea.   Breast cancer (HCC)    Cervix dysplasia    GERD (gastroesophageal reflux disease)    Heart attack (HCC) 2002, 2006   Both events reportedly were perioperatively. She has no recollection. Initial PCI was related to angina symptoms (bilateral arm pain)   High cholesterol    Hypertension    Lumbosacral spinal stenosis    Osteoarthritis    Skin cancer    Sleep apnea    Uses CPAP faithfully   Squamous cell carcinoma of skin 02/12/2020   in situ- left neck-anterior (CX35FU)   Past Surgical History:  Procedure Laterality Date   ABDOMINAL HYSTERECTOMY     bone density     CARDIAC CATHETERIZATION  2001    Dr. Aleen Campi [facility]) 2 vessel CAD involving LAD-D1 and distal Cx-OM. Appearance of dilated tapered left main with no significant atherosclerosis on IVUS --  BMS PCI of pLAD ([facility] BMS 3.0 mm x 15 mm) & with PTCA of ostial D1, PTCA of dLAD.  BMS PCI pCx [facility] BMS 3.5 mm x 11 mm) - unable to reopen dCx-OM (probable distal Atheroembolic occlusion.  Normal LVEF.   COLONOSCOPY     CORONARY ARTERY BYPASS GRAFT  2002  Unsure of details [facility], in [facility])   CORONARY STENT INTERVENTION  2001   ([place]) [facility] BMS PCI pLAD (3.0 mm x 15 mm) -PTCA of jailed D1 as well as distal LAD; pCx ([facility]ale BMS 3.5 mm x 11 mm) -unable to open distalCx-OM -thromboembolic occlusion   DIAGNOSTIC MAMMOGRAM     LEFT HEART CATH AND CORS/GRAFTS ANGIOGRAPHY N/A 04/25/2019   Procedure: LEFT HEART CATH AND CORS/GRAFTS ANGIOGRAPHY;  Surgeon: Lennette Bihari, MD;  Location: [facility] INVASIVE CV LAB;;; ost-prox LCx stent @ OM1 ostium =100% CTO, Ost-prox LAD stent ~30% ISR, Ost D1 CTO, prox  RCA ~30%. SCG-OM2 patent with ostial stent 30%, LIMA-LAD patent; CTO of SVG-D1.    left knee replacement     MASTECTOMY     NM MYOVIEW LTD  12/2014; 2018   Cardiologist (Dr. Asa Saunas.  [place], [place] ph# [phone]) -->LEXISCAN: Nonischemic, "normal "   pap smear     tonsilectomy     TRANSTHORACIC ECHOCARDIOGRAM  12/2014   Echocardiogram 12/25/14- normal LVEF and systolic function, EF 50-55%, mild grade 1 diastolic dysfunction   TRANSTHORACIC ECHOCARDIOGRAM  04/24/2019   EF 65 to 70%.  Moderate septal LVH.  GR 1 DD-elevated EDP.  Normal PAP.  Relatively normal valves.  No aortic stenosis.    Allergies  Allergen Reactions   Codeine Nausea Only    Outpatient Encounter Medications as of 07/04/2021  Medication Sig   acetaminophen (TYLENOL) 500 MG tablet Take 2 tablets (1,000 mg total) by mouth 2 (two) times daily.   amiodarone (PACERONE) 100 MG tablet Take 100 mg by mouth daily.   apixaban (ELIQUIS) 5 MG TABS tablet Take 1 tablet (5 mg total) by mouth 2 (two) times daily.   atorvastatin (LIPITOR) 80 MG tablet Take 1 tablet (80 mg total) by mouth daily.   Cholecalciferol (VITAMIN D3) 50 MCG (2000 UT) TABS Take 2,000 Units by mouth daily after breakfast.   docusate sodium (COLACE) 100 MG capsule Take 1 capsule (100 mg total) by mouth daily.   fexofenadine (ALLEGRA) 180 MG tablet Take 1 tablet (180 mg total) by mouth daily as needed for allergies or rhinitis.   fluticasone (FLONASE) 50 MCG/ACT nasal spray Place 1 spray into both nostrils daily as needed for allergies or rhinitis.   losartan (COZAAR) 25 MG tablet Take 25 mg by mouth daily.   melatonin 5 MG TABS Take 5 mg by mouth at bedtime.   methocarbamol (ROBAXIN) 500 MG tablet Take 250 mg by mouth as needed.   metoprolol succinate (TOPROL-XL) 50 MG 24 hr tablet Take 1 tablet (50 mg total) by mouth daily. Take with or immediately following a meal.   nitroGLYCERIN (NITROSTAT) 0.4 MG SL tablet Place 0.4 mg under the tongue as needed for chest  pain (Every 5 minutes x 3 doses).   nystatin (MYCOSTATIN/NYSTOP) powder Apply 1 Application topically 2 (two) times daily.   omeprazole (PRILOSEC OTC) 20 MG tablet Take 1 tablet (20 mg total) by mouth daily as needed.   polyethylene glycol (MIRALAX / GLYCOLAX) 17 g packet Take 17 g by mouth daily. Give with 4 oz of water.   senna-docusate (SENOKOT-S) 8.6-50 MG tablet Take 2 tablets by mouth at bedtime.   traMADol (ULTRAM) 50 MG tablet Take 1 tablet (50 mg total) by mouth every 6 (six) hours as needed.   zinc oxide 20 % ointment Apply 1 application topically 3 (three) times daily as needed for irritation. Apply to buttocks after every incontinent episode and as needed for  redness.   No facility-administered encounter medications on file as of 07/04/2021.    Review of Systems  Constitutional:  Negative for appetite change, chills, fatigue and fever.  HENT:  Negative for congestion, hearing loss, rhinorrhea and sore throat.   Eyes: Negative.   Respiratory:  Negative for cough, shortness of breath and wheezing.   Cardiovascular:  Negative for chest pain, palpitations and leg swelling.  Gastrointestinal:  Negative for abdominal pain, constipation, diarrhea, nausea and vomiting.  Genitourinary:  Negative for dysuria.  Musculoskeletal:  Negative for arthralgias, back pain and myalgias.  Skin:  Negative for color change, rash and wound.  Neurological:  Negative for dizziness, weakness and headaches.  Psychiatric/Behavioral:  Positive for dysphoric mood. Negative for behavioral problems and hallucinations. The patient is not nervous/anxious.       Immunization History  Administered Date(s) Administered   Fluad Quad(high Dose 65+) 10/28/2020   Influenza, High Dose Seasonal PF 10/21/2016, 09/24/2017, 10/25/2019   Influenza-Unspecified 10/03/2011, 10/04/2012, 01/13/2016   Moderna SARS-COV2 Booster Vaccination 11/21/2019, 06/11/2020   Moderna Sars-Covid-2 Vaccination 01/16/2019, 02/13/2019,  03/13/2019   Pneumococcal-Unspecified 01/13/2016   Tetanus 12/01/2017   Zoster Recombinat (Shingrix) 08/24/2018, 02/22/2020   Pertinent  Health Maintenance Due  Topic Date Due   HEMOGLOBIN A1C  12/01/2019   OPHTHALMOLOGY EXAM  05/30/2021   INFLUENZA VACCINE  08/12/2021   FOOT EXAM  10/17/2021   DEXA SCAN  Completed      05/25/2019    1:28 PM 09/12/2020    2:57 PM 10/17/2020    2:53 PM 02/07/2021   12:47 PM 06/19/2021   12:11 AM  Fall Risk  Falls in the past year? 0 0 0 1   Was there an injury with Fall?  0 0 0   Fall Risk Category Calculator  0 0 1   Fall Risk Category  Low Low Low   Patient Fall Risk Level  Low fall risk Low fall risk Moderate fall risk Moderate fall risk  Patient at Risk for Falls Due to  No Fall Risks No Fall Risks History of fall(s);Impaired balance/gait;Impaired mobility;Orthopedic patient   Patient at Risk for Falls Due to - Comments    spinal stenosis of lumbar   Fall risk Follow up  Falls evaluation completed Falls evaluation completed Falls evaluation completed;Education provided;Falls prevention discussed      Vitals:   07/04/21 1624  BP: 121/71  Pulse: 79  Resp: 17  Temp: 97.8 F (36.6 C)  SpO2: 95%  Weight: 183 lb 11.2 oz (83.3 kg)  Height: 5\' 7"  (1.702 m)   Body mass index is 28.77 kg/m.  Physical Exam Constitutional:      Appearance: Normal appearance.  HENT:     Head: Normocephalic and atraumatic.     Nose: Nose normal.     Mouth/Throat:     Mouth: Mucous membranes are moist.  Eyes:     Conjunctiva/sclera: Conjunctivae normal.  Cardiovascular:     Rate and Rhythm: Normal rate and regular rhythm.  Pulmonary:     Effort: Pulmonary effort is normal.     Breath sounds: Normal breath sounds.  Abdominal:     General: Bowel sounds are normal.     Palpations: Abdomen is soft.  Musculoskeletal:        General: Swelling present. Normal range of motion.     Cervical back: Normal range of motion.     Comments: BLE trace edema.  Skin:     General: Skin is warm and dry.  Neurological:  General: No focal deficit present.     Mental Status: She is alert and oriented to person, place, and time.  Psychiatric:        Mood and Affect: Mood normal.        Behavior: Behavior normal.        Thought Content: Thought content normal.        Judgment: Judgment normal.      Labs reviewed: Recent Labs    03/26/21 0000 04/30/21 0000 06/19/21 0006 06/19/21 0010  NA 140 140 140 141  K 4.4 4.2 4.1 4.0  CL 106 103 104 103  CO2 27* 26* 28  --   GLUCOSE  --   --  108* 106*  BUN 21 16 14 17   CREATININE 0.9 0.9 0.85 0.80  CALCIUM 9.2 9.5 9.8  --    Recent Labs    12/02/20 0000 03/26/21 0000  AST 17 15  ALT 15 12  ALKPHOS 43 47  ALBUMIN 3.6 3.8   Recent Labs    12/02/20 0000 03/26/21 0000 05/01/21 0000 06/19/21 0006 06/19/21 0010  WBC 6.0 6.5 6.9 7.2  --   NEUTROABS 10.70 3,627.00  --  3.5  --   HGB 12.4 11.9* 11.7* 13.2 13.9  HCT 37 35* 35* 41.8 41.0  MCV  --   --   --  105.0*  --   PLT 248 226 221 216  --    Lab Results  Component Value Date   TSH 1.390 07/03/2021   Lab Results  Component Value Date   HGBA1C 5.9 (H) 05/31/2019   Lab Results  Component Value Date   CHOL 169 04/05/2020   HDL 70 04/05/2020   LDLCALC 74 04/05/2020   TRIG 150 (H) 04/05/2020   CHOLHDL 2.4 04/05/2020    Significant Diagnostic Results in last 30 days:  CT Head Wo Contrast  Result Date: 06/19/2021 CLINICAL DATA:  Fall, head and neck trauma. EXAM: CT HEAD WITHOUT CONTRAST CT CERVICAL SPINE WITHOUT CONTRAST TECHNIQUE: Multidetector CT imaging of the head and cervical spine was performed following the standard protocol without intravenous contrast. Multiplanar CT image reconstructions of the cervical spine were also generated. RADIATION DOSE REDUCTION: This exam was performed according to the departmental dose-optimization program which includes automated exposure control, adjustment of the mA and/or kV according to patient size  and/or use of iterative reconstruction technique. COMPARISON:  None Available. FINDINGS: CT HEAD FINDINGS Brain: No acute intracranial hemorrhage, midline shift or mass effect. No extra-axial fluid collection. Diffuse atrophy is noted. Subcortical and periventricular white matter hypodensities are noted bilaterally. No hydrocephalus. An old lacunar infarct is noted in the basal ganglia on the left. Vascular: Atherosclerotic calcification of the carotid siphons. Atherosclerotic calcification and dolichoectasia of the basilar artery and left vertebral artery. Skull: Normal. Negative for fracture or focal lesion. Sinuses/Orbits: Mild mucosal thickening in the ethmoid air cells bilaterally. No acute orbital abnormality. Other: Small scalp contusion over the parietal bone on the right. CT CERVICAL SPINE FINDINGS Alignment: There is mild anterolisthesis at C4-C5, C5-C6 and C6-C7. Skull base and vertebrae: No acute fracture. No primary bone lesion or focal pathologic process. Soft tissues and spinal canal: No prevertebral fluid or swelling. No visible canal hematoma. Disc levels: Multilevel intervertebral disc space narrowing, uncovertebral osteophyte formation, disc herniations, and facet arthropathy. Upper chest: Aortic atherosclerosis. Other: None. IMPRESSION: 1. No acute intracranial process. 2. Atrophy with chronic microvascular ischemic changes. 3. Small contusion over the parietal bone on the right. 4. Multilevel degenerative  changes in the cervical spine without evidence of acute fracture. Electronically Signed   By: Thornell Sartorius M.D.   On: 06/19/2021 00:45   CT Cervical Spine Wo Contrast  Result Date: 06/19/2021 CLINICAL DATA:  Fall, head and neck trauma. EXAM: CT HEAD WITHOUT CONTRAST CT CERVICAL SPINE WITHOUT CONTRAST TECHNIQUE: Multidetector CT imaging of the head and cervical spine was performed following the standard protocol without intravenous contrast. Multiplanar CT image reconstructions of the  cervical spine were also generated. RADIATION DOSE REDUCTION: This exam was performed according to the departmental dose-optimization program which includes automated exposure control, adjustment of the mA and/or kV according to patient size and/or use of iterative reconstruction technique. COMPARISON:  None Available. FINDINGS: CT HEAD FINDINGS Brain: No acute intracranial hemorrhage, midline shift or mass effect. No extra-axial fluid collection. Diffuse atrophy is noted. Subcortical and periventricular white matter hypodensities are noted bilaterally. No hydrocephalus. An old lacunar infarct is noted in the basal ganglia on the left. Vascular: Atherosclerotic calcification of the carotid siphons. Atherosclerotic calcification and dolichoectasia of the basilar artery and left vertebral artery. Skull: Normal. Negative for fracture or focal lesion. Sinuses/Orbits: Mild mucosal thickening in the ethmoid air cells bilaterally. No acute orbital abnormality. Other: Small scalp contusion over the parietal bone on the right. CT CERVICAL SPINE FINDINGS Alignment: There is mild anterolisthesis at C4-C5, C5-C6 and C6-C7. Skull base and vertebrae: No acute fracture. No primary bone lesion or focal pathologic process. Soft tissues and spinal canal: No prevertebral fluid or swelling. No visible canal hematoma. Disc levels: Multilevel intervertebral disc space narrowing, uncovertebral osteophyte formation, disc herniations, and facet arthropathy. Upper chest: Aortic atherosclerosis. Other: None. IMPRESSION: 1. No acute intracranial process. 2. Atrophy with chronic microvascular ischemic changes. 3. Small contusion over the parietal bone on the right. 4. Multilevel degenerative changes in the cervical spine without evidence of acute fracture. Electronically Signed   By: Thornell Sartorius M.D.   On: 06/19/2021 00:45    Assessment/Plan  1. Depression, major, single episode, mild (HCC) -  she is in a new environment and probably  adjusting -  will get psych consult - sertraline (ZOLOFT) 25 MG tablet; Take 1 tablet (25 mg total) by mouth daily.  Dispense: 30 tablet; Refill: 3  2. Essential hypertension -  -Blood pressure well controlled Continue current medications  3. Paroxysmal atrial fibrillation/flutter (HCC) -  rate-controlled, continue amiodarone and metoprolol for rate control and Eliquis for anticoagulation  4. Primary insomnia -Recently started on melatonin    Family/ staff Communication: Discussed plan of care with resident and charge nurse.  Labs/tests ordered:   None    Kenard Gower, DNP, MSN, FNP-BC [facility] [phone] (Monday-Friday 8:00 a.m. - 5:00 p.m.) [phone] (after hours)

## 2021-07-05 DIAGNOSIS — M6281 Muscle weakness (generalized): Secondary | ICD-10-CM | POA: Diagnosis not present

## 2021-07-05 DIAGNOSIS — I129 Hypertensive chronic kidney disease with stage 1 through stage 4 chronic kidney disease, or unspecified chronic kidney disease: Secondary | ICD-10-CM | POA: Diagnosis not present

## 2021-07-05 DIAGNOSIS — R41841 Cognitive communication deficit: Secondary | ICD-10-CM | POA: Diagnosis not present

## 2021-07-05 DIAGNOSIS — R2689 Other abnormalities of gait and mobility: Secondary | ICD-10-CM | POA: Diagnosis not present

## 2021-07-05 DIAGNOSIS — R262 Difficulty in walking, not elsewhere classified: Secondary | ICD-10-CM | POA: Diagnosis not present

## 2021-07-05 DIAGNOSIS — R1312 Dysphagia, oropharyngeal phase: Secondary | ICD-10-CM | POA: Diagnosis not present

## 2021-07-07 ENCOUNTER — Other Ambulatory Visit: Payer: Self-pay | Admitting: Adult Health

## 2021-07-07 ENCOUNTER — Telehealth: Payer: Self-pay | Admitting: Neurology

## 2021-07-07 ENCOUNTER — Ambulatory Visit (INDEPENDENT_AMBULATORY_CARE_PROVIDER_SITE_OTHER): Payer: Medicare Other | Admitting: Dermatology

## 2021-07-07 DIAGNOSIS — L820 Inflamed seborrheic keratosis: Secondary | ICD-10-CM | POA: Diagnosis not present

## 2021-07-07 DIAGNOSIS — Z85828 Personal history of other malignant neoplasm of skin: Secondary | ICD-10-CM

## 2021-07-07 DIAGNOSIS — L821 Other seborrheic keratosis: Secondary | ICD-10-CM

## 2021-07-07 DIAGNOSIS — I25119 Atherosclerotic heart disease of native coronary artery with unspecified angina pectoris: Secondary | ICD-10-CM | POA: Diagnosis not present

## 2021-07-07 DIAGNOSIS — C4492 Squamous cell carcinoma of skin, unspecified: Secondary | ICD-10-CM

## 2021-07-07 MED ORDER — SERTRALINE HCL 25 MG PO TABS: 25.0000 mg | ORAL_TABLET | Freq: Every day | ORAL | 3 refills | Status: DC

## 2021-07-07 MED ORDER — LORAZEPAM 0.5 MG PO TABS: 0.5000 mg | ORAL_TABLET | Freq: Every evening | ORAL | 0 refills | Status: AC | PRN

## 2021-07-07 NOTE — Telephone Encounter (Signed)
medicare/[employer] NPR sent to GI  

## 2021-07-08 ENCOUNTER — Encounter: Payer: Self-pay | Admitting: Neurology

## 2021-07-08 DIAGNOSIS — R262 Difficulty in walking, not elsewhere classified: Secondary | ICD-10-CM | POA: Diagnosis not present

## 2021-07-08 DIAGNOSIS — R1312 Dysphagia, oropharyngeal phase: Secondary | ICD-10-CM | POA: Diagnosis not present

## 2021-07-08 DIAGNOSIS — R2689 Other abnormalities of gait and mobility: Secondary | ICD-10-CM | POA: Diagnosis not present

## 2021-07-08 DIAGNOSIS — R41841 Cognitive communication deficit: Secondary | ICD-10-CM | POA: Diagnosis not present

## 2021-07-08 DIAGNOSIS — M6281 Muscle weakness (generalized): Secondary | ICD-10-CM | POA: Diagnosis not present

## 2021-07-08 DIAGNOSIS — I129 Hypertensive chronic kidney disease with stage 1 through stage 4 chronic kidney disease, or unspecified chronic kidney disease: Secondary | ICD-10-CM | POA: Diagnosis not present

## 2021-07-09 DIAGNOSIS — R262 Difficulty in walking, not elsewhere classified: Secondary | ICD-10-CM | POA: Diagnosis not present

## 2021-07-09 DIAGNOSIS — R2689 Other abnormalities of gait and mobility: Secondary | ICD-10-CM | POA: Diagnosis not present

## 2021-07-09 DIAGNOSIS — R41841 Cognitive communication deficit: Secondary | ICD-10-CM | POA: Diagnosis not present

## 2021-07-09 DIAGNOSIS — M6281 Muscle weakness (generalized): Secondary | ICD-10-CM | POA: Diagnosis not present

## 2021-07-09 DIAGNOSIS — R1312 Dysphagia, oropharyngeal phase: Secondary | ICD-10-CM | POA: Diagnosis not present

## 2021-07-09 DIAGNOSIS — I129 Hypertensive chronic kidney disease with stage 1 through stage 4 chronic kidney disease, or unspecified chronic kidney disease: Secondary | ICD-10-CM | POA: Diagnosis not present

## 2021-07-10 DIAGNOSIS — R2689 Other abnormalities of gait and mobility: Secondary | ICD-10-CM | POA: Diagnosis not present

## 2021-07-10 DIAGNOSIS — I129 Hypertensive chronic kidney disease with stage 1 through stage 4 chronic kidney disease, or unspecified chronic kidney disease: Secondary | ICD-10-CM | POA: Diagnosis not present

## 2021-07-10 DIAGNOSIS — R1312 Dysphagia, oropharyngeal phase: Secondary | ICD-10-CM | POA: Diagnosis not present

## 2021-07-10 DIAGNOSIS — R262 Difficulty in walking, not elsewhere classified: Secondary | ICD-10-CM | POA: Diagnosis not present

## 2021-07-10 DIAGNOSIS — R41841 Cognitive communication deficit: Secondary | ICD-10-CM | POA: Diagnosis not present

## 2021-07-10 DIAGNOSIS — M6281 Muscle weakness (generalized): Secondary | ICD-10-CM | POA: Diagnosis not present

## 2021-07-11 DIAGNOSIS — R2689 Other abnormalities of gait and mobility: Secondary | ICD-10-CM | POA: Diagnosis not present

## 2021-07-11 DIAGNOSIS — I129 Hypertensive chronic kidney disease with stage 1 through stage 4 chronic kidney disease, or unspecified chronic kidney disease: Secondary | ICD-10-CM | POA: Diagnosis not present

## 2021-07-11 DIAGNOSIS — R262 Difficulty in walking, not elsewhere classified: Secondary | ICD-10-CM | POA: Diagnosis not present

## 2021-07-11 DIAGNOSIS — R1312 Dysphagia, oropharyngeal phase: Secondary | ICD-10-CM | POA: Diagnosis not present

## 2021-07-11 DIAGNOSIS — R41841 Cognitive communication deficit: Secondary | ICD-10-CM | POA: Diagnosis not present

## 2021-07-11 DIAGNOSIS — M6281 Muscle weakness (generalized): Secondary | ICD-10-CM | POA: Diagnosis not present

## 2021-07-14 ENCOUNTER — Ambulatory Visit (HOSPITAL_COMMUNITY)
Admission: RE | Admit: 2021-07-14 | Discharge: 2021-07-14 | Disposition: A | Discharge status: Home / Self Care | Payer: Medicare Other | Source: Ambulatory Visit | Attending: Neurology | Admitting: Neurology

## 2021-07-14 DIAGNOSIS — R2689 Other abnormalities of gait and mobility: Secondary | ICD-10-CM | POA: Diagnosis not present

## 2021-07-14 DIAGNOSIS — Z9181 History of falling: Secondary | ICD-10-CM | POA: Diagnosis not present

## 2021-07-14 DIAGNOSIS — R531 Weakness: Secondary | ICD-10-CM | POA: Insufficient documentation

## 2021-07-14 DIAGNOSIS — R269 Unspecified abnormalities of gait and mobility: Secondary | ICD-10-CM | POA: Insufficient documentation

## 2021-07-14 DIAGNOSIS — R1312 Dysphagia, oropharyngeal phase: Secondary | ICD-10-CM | POA: Diagnosis not present

## 2021-07-14 DIAGNOSIS — R41841 Cognitive communication deficit: Secondary | ICD-10-CM | POA: Diagnosis not present

## 2021-07-14 DIAGNOSIS — I129 Hypertensive chronic kidney disease with stage 1 through stage 4 chronic kidney disease, or unspecified chronic kidney disease: Secondary | ICD-10-CM | POA: Diagnosis not present

## 2021-07-14 DIAGNOSIS — R29898 Other symptoms and signs involving the musculoskeletal system: Secondary | ICD-10-CM | POA: Diagnosis not present

## 2021-07-14 DIAGNOSIS — M6281 Muscle weakness (generalized): Secondary | ICD-10-CM | POA: Diagnosis not present

## 2021-07-14 DIAGNOSIS — R262 Difficulty in walking, not elsewhere classified: Secondary | ICD-10-CM | POA: Diagnosis not present

## 2021-07-14 DIAGNOSIS — N189 Chronic kidney disease, unspecified: Secondary | ICD-10-CM | POA: Diagnosis not present

## 2021-07-14 NOTE — Progress Notes (Signed)
Bilateral carotid duplex study completed. Please see CV Proc for preliminary results.  Braydn Carneiro BS, RVT 07/14/2021 1:38 PM

## 2021-07-15 DIAGNOSIS — R2689 Other abnormalities of gait and mobility: Secondary | ICD-10-CM | POA: Diagnosis not present

## 2021-07-15 DIAGNOSIS — R41841 Cognitive communication deficit: Secondary | ICD-10-CM | POA: Diagnosis not present

## 2021-07-15 DIAGNOSIS — M6281 Muscle weakness (generalized): Secondary | ICD-10-CM | POA: Diagnosis not present

## 2021-07-15 DIAGNOSIS — I129 Hypertensive chronic kidney disease with stage 1 through stage 4 chronic kidney disease, or unspecified chronic kidney disease: Secondary | ICD-10-CM | POA: Diagnosis not present

## 2021-07-15 DIAGNOSIS — R1312 Dysphagia, oropharyngeal phase: Secondary | ICD-10-CM | POA: Diagnosis not present

## 2021-07-15 DIAGNOSIS — R262 Difficulty in walking, not elsewhere classified: Secondary | ICD-10-CM | POA: Diagnosis not present

## 2021-07-16 DIAGNOSIS — M6281 Muscle weakness (generalized): Secondary | ICD-10-CM | POA: Diagnosis not present

## 2021-07-16 DIAGNOSIS — R262 Difficulty in walking, not elsewhere classified: Secondary | ICD-10-CM | POA: Diagnosis not present

## 2021-07-16 DIAGNOSIS — R41841 Cognitive communication deficit: Secondary | ICD-10-CM | POA: Diagnosis not present

## 2021-07-16 DIAGNOSIS — R2689 Other abnormalities of gait and mobility: Secondary | ICD-10-CM | POA: Diagnosis not present

## 2021-07-16 DIAGNOSIS — I129 Hypertensive chronic kidney disease with stage 1 through stage 4 chronic kidney disease, or unspecified chronic kidney disease: Secondary | ICD-10-CM | POA: Diagnosis not present

## 2021-07-16 DIAGNOSIS — R1312 Dysphagia, oropharyngeal phase: Secondary | ICD-10-CM | POA: Diagnosis not present

## 2021-07-17 DIAGNOSIS — M6281 Muscle weakness (generalized): Secondary | ICD-10-CM | POA: Diagnosis not present

## 2021-07-17 DIAGNOSIS — R41841 Cognitive communication deficit: Secondary | ICD-10-CM | POA: Diagnosis not present

## 2021-07-17 DIAGNOSIS — R262 Difficulty in walking, not elsewhere classified: Secondary | ICD-10-CM | POA: Diagnosis not present

## 2021-07-17 DIAGNOSIS — R1312 Dysphagia, oropharyngeal phase: Secondary | ICD-10-CM | POA: Diagnosis not present

## 2021-07-17 DIAGNOSIS — R2689 Other abnormalities of gait and mobility: Secondary | ICD-10-CM | POA: Diagnosis not present

## 2021-07-17 DIAGNOSIS — I129 Hypertensive chronic kidney disease with stage 1 through stage 4 chronic kidney disease, or unspecified chronic kidney disease: Secondary | ICD-10-CM | POA: Diagnosis not present

## 2021-07-18 DIAGNOSIS — R1312 Dysphagia, oropharyngeal phase: Secondary | ICD-10-CM | POA: Diagnosis not present

## 2021-07-18 DIAGNOSIS — R2689 Other abnormalities of gait and mobility: Secondary | ICD-10-CM | POA: Diagnosis not present

## 2021-07-18 DIAGNOSIS — I129 Hypertensive chronic kidney disease with stage 1 through stage 4 chronic kidney disease, or unspecified chronic kidney disease: Secondary | ICD-10-CM | POA: Diagnosis not present

## 2021-07-18 DIAGNOSIS — R41841 Cognitive communication deficit: Secondary | ICD-10-CM | POA: Diagnosis not present

## 2021-07-18 DIAGNOSIS — M6281 Muscle weakness (generalized): Secondary | ICD-10-CM | POA: Diagnosis not present

## 2021-07-18 DIAGNOSIS — R262 Difficulty in walking, not elsewhere classified: Secondary | ICD-10-CM | POA: Diagnosis not present

## 2021-07-19 ENCOUNTER — Ambulatory Visit (HOSPITAL_COMMUNITY)
Admission: RE | Admit: 2021-07-19 | Discharge: 2021-07-19 | Disposition: A | Discharge status: Home / Self Care | Payer: Medicare Other | Source: Ambulatory Visit | Attending: Neurology | Admitting: Neurology

## 2021-07-19 ENCOUNTER — Encounter (HOSPITAL_COMMUNITY): Payer: Self-pay

## 2021-07-19 DIAGNOSIS — R269 Unspecified abnormalities of gait and mobility: Secondary | ICD-10-CM

## 2021-07-19 DIAGNOSIS — R531 Weakness: Secondary | ICD-10-CM

## 2021-07-21 DIAGNOSIS — R262 Difficulty in walking, not elsewhere classified: Secondary | ICD-10-CM | POA: Diagnosis not present

## 2021-07-21 DIAGNOSIS — R41841 Cognitive communication deficit: Secondary | ICD-10-CM | POA: Diagnosis not present

## 2021-07-21 DIAGNOSIS — I129 Hypertensive chronic kidney disease with stage 1 through stage 4 chronic kidney disease, or unspecified chronic kidney disease: Secondary | ICD-10-CM | POA: Diagnosis not present

## 2021-07-21 DIAGNOSIS — R1312 Dysphagia, oropharyngeal phase: Secondary | ICD-10-CM | POA: Diagnosis not present

## 2021-07-21 DIAGNOSIS — R2689 Other abnormalities of gait and mobility: Secondary | ICD-10-CM | POA: Diagnosis not present

## 2021-07-21 DIAGNOSIS — M6281 Muscle weakness (generalized): Secondary | ICD-10-CM | POA: Diagnosis not present

## 2021-07-22 ENCOUNTER — Encounter: Payer: Self-pay | Admitting: Internal Medicine

## 2021-07-22 ENCOUNTER — Non-Acute Institutional Stay (SKILLED_NURSING_FACILITY): Payer: Medicare Other | Admitting: Internal Medicine

## 2021-07-22 ENCOUNTER — Encounter: Payer: Self-pay | Admitting: Neurology

## 2021-07-22 ENCOUNTER — Other Ambulatory Visit: Payer: Self-pay | Admitting: *Deleted

## 2021-07-22 DIAGNOSIS — I480 Paroxysmal atrial fibrillation: Secondary | ICD-10-CM | POA: Diagnosis not present

## 2021-07-22 DIAGNOSIS — R262 Difficulty in walking, not elsewhere classified: Secondary | ICD-10-CM | POA: Diagnosis not present

## 2021-07-22 DIAGNOSIS — F320 Major depressive disorder, single episode, mild: Secondary | ICD-10-CM

## 2021-07-22 DIAGNOSIS — M5441 Lumbago with sciatica, right side: Secondary | ICD-10-CM

## 2021-07-22 DIAGNOSIS — M159 Polyosteoarthritis, unspecified: Secondary | ICD-10-CM | POA: Diagnosis not present

## 2021-07-22 DIAGNOSIS — G8929 Other chronic pain: Secondary | ICD-10-CM | POA: Diagnosis not present

## 2021-07-22 DIAGNOSIS — F5101 Primary insomnia: Secondary | ICD-10-CM

## 2021-07-22 DIAGNOSIS — R2689 Other abnormalities of gait and mobility: Secondary | ICD-10-CM | POA: Diagnosis not present

## 2021-07-22 DIAGNOSIS — R41841 Cognitive communication deficit: Secondary | ICD-10-CM | POA: Diagnosis not present

## 2021-07-22 DIAGNOSIS — I10 Essential (primary) hypertension: Secondary | ICD-10-CM | POA: Diagnosis not present

## 2021-07-22 DIAGNOSIS — M6281 Muscle weakness (generalized): Secondary | ICD-10-CM | POA: Diagnosis not present

## 2021-07-22 DIAGNOSIS — I129 Hypertensive chronic kidney disease with stage 1 through stage 4 chronic kidney disease, or unspecified chronic kidney disease: Secondary | ICD-10-CM | POA: Diagnosis not present

## 2021-07-22 DIAGNOSIS — R1312 Dysphagia, oropharyngeal phase: Secondary | ICD-10-CM | POA: Diagnosis not present

## 2021-07-22 MED ORDER — ALPRAZOLAM 1 MG PO TABS: ORAL_TABLET | ORAL | 0 refills | Status: DC

## 2021-07-22 NOTE — Telephone Encounter (Addendum)
I spoke to the patient's dgt, Stephanie Shea. Reports her mother was unable to lie still for the MRI brain this past Saturday. She will need a sedative to be able to complete the scan.   She resides at [facility] (ph: [phone]). I spoke to the Director of Nursing, Randall Hiss. They are unable to accept a prescription with a range of tablets. They are unable to medicate with a controlled substance at the MRI facility. They will be able to give her the medication right before she leaves for the appt. For these reasons, Dr. Krista Blue has provided the following rx:  Alprazolam '1mg'$ , one tablet immediately prior to transporting to MRI.   Randall Hiss took the information verbally. The prescription has also been faxed to [facility] at [phone].   I called her dgt and provided this update. She will reschedule the scan and call the facility to give them the new appt date and time.

## 2021-07-22 NOTE — Progress Notes (Addendum)
Location:   [place] Room Number: 34 Place of Service:  SNF [phone]) Provider:  Veleta Miners MD  Virgie Dad, MD  Patient Care Team: Virgie Dad, MD as PCP - General (Internal Medicine) Leonie Man, MD as PCP - Cardiology (Cardiology) Mast, [name], NP as Nurse Practitioner (Internal Medicine) Lavonna Monarch, MD as Consulting Physician (Dermatology)  Extended Emergency Contact Information Primary Emergency Contact: Zoar Mobile Phone: [phone] Relation: Daughter Secondary Emergency Contact: Weeks,Tiffany Mobile Phone: [phone] Relation: Daughter Preferred language: Cleophus Molt Interpreter needed? No  Code Status:  DNI Goals of care: Advanced Directive information    07/22/2021    3:16 PM  Advanced Directives  Does Patient Have a Medical Advance Directive? Yes  Type of Paramedic of [place];Out of facility DNR (pink MOST or yellow form);Living will  Does patient want to make changes to medical advance directive? No - Patient declined  Copy of [place] in Chart? Yes - validated most recent copy scanned in chart (See row information)  Pre-existing out of facility DNR order (yellow form or pink MOST form) Yellow form placed in chart (order not valid for inpatient use);Pink MOST form placed in chart (order not valid for inpatient use)     Chief Complaint  Patient presents with   Acute Visit    HPI:  Pt is a 88 y.o. female seen today for an acute visit for Anxiety Insomnia and Back pain  Patient lives SNF    Patient has a history of CAD s/p CABG in 2002 S/p cath in 04/21 which showed occluded SVG to Diag but patent LIMA to LAD and SVG to CX.EF. On Medical Management EF 65% PAF diagnosed in 4/21 on Eliquis Sleep Apnea uses CPAP H/I Skin Cancer H/o Breast Cancer s/p Mastectomy  HTN,HLD and Arthritis Cognitive impairment LE Edema .Also had Spinal Injection in 01/23   Acute  issues today  Insomnia Per daughter she doesn't sleep and calls her at night Ativan was helping and wants her orders renewed Back pain radiating to right hip Wants Tramadol schedule so she can sleep better at night Sleep Apnea Not using her mask at night as not comfortable;e  Awaiting to get her MRI of head as per Neurology  Other wise doing well Adjusted in SNF Mood much better Right shoulder pain better Per therapy walking well with them and mild assist  Past Medical History:  Diagnosis Date   Atherosclerosis of native coronary artery 2001   LAD & Cx disease -- BMS PCI in 2001; CABG 2002.  PCI in 2006 (per-op TAH)   Atrial fibrillation and flutter ([place]) 04/2019   New diagnosis-major complaint was chest tightness and dyspnea.   Breast cancer ([place])    Cervix dysplasia    GERD (gastroesophageal reflux disease)    Heart attack ([place]) 2002, 2006   Both events reportedly were perioperatively. She has no recollection. Initial PCI was related to angina symptoms (bilateral arm pain)   High cholesterol    Hypertension    Lumbosacral spinal stenosis    Osteoarthritis    Skin cancer    Sleep apnea    Uses CPAP faithfully   Squamous cell carcinoma of skin 02/12/2020   in situ- left neck-anterior (CX35FU)   Past Surgical History:  Procedure Laterality Date   ABDOMINAL HYSTERECTOMY     bone density     CARDIAC CATHETERIZATION  2001    Dr. Glade Lloyd [facility]) 2 vessel CAD involving LAD-D1  and distal Cx-OM. Appearance of dilated tapered left main with no significant atherosclerosis on IVUS --  BMS PCI of pLAD (Royale BMS 3.0 mm x 15 mm) & with PTCA of ostial D1, PTCA of dLAD.  BMS PCI pCx [facility] BMS 3.5 mm x 11 mm) - unable to reopen dCx-OM (probable distal Atheroembolic occlusion.  Normal LVEF.   COLONOSCOPY     CORONARY ARTERY BYPASS GRAFT  2002   Unsure of details [facility], in [facility])   [place]  2001   ([place]) Royale BMS PCI pLAD (3.0 mm x 15 mm)  -PTCA of jailed D1 as well as distal LAD; pCx (Royale BMS 3.5 mm x 11 mm) -unable to open distalCx-OM -thromboembolic occlusion   DIAGNOSTIC MAMMOGRAM     LEFT HEART CATH AND CORS/GRAFTS ANGIOGRAPHY N/A 04/25/2019   Procedure: LEFT HEART CATH AND CORS/GRAFTS ANGIOGRAPHY;  Surgeon: Troy Sine, MD;  Location: [facility] INV[facility]IVE [facility] LAB;;; ost-prox LCx stent @ OM1 ostium =100% CTO, Ost-prox LAD stent ~30% ISR, Ost D1 CTO, prox RCA ~30%. SCG-OM2 patent with ostial stent 30%, LIMA-LAD patent; CTO of SVG-D1.    left knee replacement     MASTECTOMY     NM MYOVIEW LTD  12/2014; 2018   Cardiologist (Dr. Kellie Simmering.  [place], [place] ph# [phone]) -->LEXISCAN: Nonischemic, "normal "   pap smear     tonsilectomy     TRANSTHORACIC ECHOCARDIOGRAM  12/2014   Echocardiogram 12/25/14- normal LVEF and systolic function, EF 14-97%, mild grade 1 diastolic dysfunction   TRANSTHORACIC ECHOCARDIOGRAM  04/24/2019   EF 65 to 70%.  Moderate septal LVH.  GR 1 DD-elevated EDP.  Normal PAP.  Relatively normal valves.  No aortic stenosis.    Allergies  Allergen Reactions   Codeine Nausea Only    Allergies as of 07/22/2021       Reactions   Codeine Nausea Only        Medication List        Accurate as of July 22, 2021  3:16 PM. If you have any questions, ask your nurse or doctor.          acetaminophen 500 MG tablet Commonly known as: TYLENOL Take 2 tablets (1,000 mg total) by mouth 2 (two) times daily.   amiodarone 100 MG tablet Commonly known as: PACERONE Take 100 mg by mouth daily.   apixaban 5 MG Tabs tablet Commonly known as: Eliquis Take 1 tablet (5 mg total) by mouth 2 (two) times daily.   atorvastatin 80 MG tablet Commonly known as: LIPITOR Take 1 tablet (80 mg total) by mouth daily.   docusate sodium 100 MG capsule Commonly known as: COLACE Take 1 capsule (100 mg total) by mouth daily.   fexofenadine 180 MG tablet Commonly known as: ALLEGRA Take 1 tablet (180 mg total) by mouth daily  as needed for allergies or rhinitis.   fluticasone 50 MCG/ACT nasal spray Commonly known as: FLONASE Place 1 spray into both nostrils daily as needed for allergies or rhinitis.   losartan 25 MG tablet Commonly known as: COZAAR Take 25 mg by mouth daily.   melatonin 5 MG Tabs Take 5 mg by mouth at bedtime.   methocarbamol 500 MG tablet Commonly known as: ROBAXIN Take 250 mg by mouth as needed.   metoprolol succinate 50 MG 24 hr tablet Commonly known as: TOPROL-XL Take 1 tablet (50 mg total) by mouth daily. Take with or immediately following a meal.   nitroGLYCERIN 0.4 MG SL tablet Commonly  known as: NITROSTAT Place 0.4 mg under the tongue as needed for chest pain (Every 5 minutes x 3 doses).   nystatin powder Commonly known as: MYCOSTATIN/NYSTOP Apply 1 Application topically 2 (two) times daily.   omeprazole 20 MG tablet Commonly known as: PRILOSEC OTC Take 1 tablet (20 mg total) by mouth daily as needed.   polyethylene glycol 17 g packet Commonly known as: MIRALAX / GLYCOLAX Take 17 g by mouth daily. Give with 4 oz of water.   senna-docusate 8.6-50 MG tablet Commonly known as: Senokot-S Take 2 tablets by mouth at bedtime.   traMADol 50 MG tablet Commonly known as: Ultram Take 1 tablet (50 mg total) by mouth every 6 (six) hours as needed.   Vitamin D3 50 MCG (2000 UT) Tabs Take 2,000 Units by mouth daily after breakfast.   zinc oxide 20 % ointment Apply 1 application topically 3 (three) times daily as needed for irritation. Apply to buttocks after every incontinent episode and as needed for redness.        Review of Systems  Constitutional:  Negative for activity change and appetite change.  HENT: Negative.    Respiratory:  Negative for cough and shortness of breath.   Cardiovascular:  Negative for leg swelling.  Gastrointestinal:  Negative for constipation.  Genitourinary: Negative.   Musculoskeletal:  Positive for arthralgias, back pain and gait problem.  Negative for myalgias.  Skin: Negative.   Neurological:  Negative for dizziness and weakness.  Psychiatric/Behavioral:  Positive for confusion and sleep disturbance. Negative for dysphoric mood.     Immunization History  Administered Date(s) Administered   Fluad Quad(high Dose 65+) 10/28/2020   Influenza, High Dose Seasonal PF 10/21/2016, 09/24/2017, 10/25/2019   Influenza-Unspecified 10/03/2011, 10/04/2012, 01/13/2016   Moderna SARS-COV2 Booster Vaccination 11/21/2019, 06/11/2020   Moderna Sars-Covid-2 Vaccination 01/16/2019, 02/13/2019, 03/13/2019   Pneumococcal-Unspecified 01/13/2016   Tetanus 12/01/2017   Zoster Recombinat (Shingrix) 08/24/2018, 02/22/2020   Pertinent  Health Maintenance Due  Topic Date Due   HEMOGLOBIN A1C  12/01/2019   OPHTHALMOLOGY EXAM  05/30/2021   INFLUENZA VACCINE  08/12/2021   FOOT EXAM  10/17/2021   DEXA SCAN  Completed      05/25/2019    1:28 PM 09/12/2020    2:57 PM 10/17/2020    2:53 PM 02/07/2021   12:47 PM 06/19/2021   12:11 AM  Fall Risk  Falls in the past year? 0 0 0 1   Was there an injury with Fall?  0 0 0   Fall Risk Category Calculator  0 0 1   Fall Risk Category  Low Low Low   Patient Fall Risk Level  Low fall risk Low fall risk Moderate fall risk Moderate fall risk  Patient at Risk for Falls Due to  No Fall Risks No Fall Risks History of fall(s);Impaired balance/gait;Impaired mobility;Orthopedic patient   Patient at Risk for Falls Due to - Comments    spinal stenosis of lumbar   Fall risk Follow up  Falls evaluation completed Falls evaluation completed Falls evaluation completed;Education provided;Falls prevention discussed    Functional Status Survey:    Vitals:   07/22/21 1504  BP: 118/64  Pulse: 67  Resp: 15  Temp: (!) 96.9 F (36.1 C)  SpO2: 92%  Weight: 186 lb (84.4 kg)  Height: '5\' 7"'$  (1.702 m)   Body mass index is 29.13 kg/m. Physical Exam Vitals reviewed.  Constitutional:      Appearance: Normal appearance.   HENT:     Head: Normocephalic.  Nose: Nose normal.     Mouth/Throat:     Mouth: Mucous membranes are moist.     Pharynx: Oropharynx is clear.  Eyes:     Pupils: Pupils are equal, round, and reactive to light.  Cardiovascular:     Rate and Rhythm: Normal rate and regular rhythm.     Pulses: Normal pulses.     Heart sounds: Murmur heard.  Pulmonary:     Effort: Pulmonary effort is normal.     Breath sounds: Normal breath sounds.  Abdominal:     General: Abdomen is flat. Bowel sounds are normal.     Palpations: Abdomen is soft.  Musculoskeletal:        General: No swelling.     Cervical back: Neck supple.  Skin:    General: Skin is warm.  Neurological:     General: No focal deficit present.     Mental Status: She is alert.  Psychiatric:        Mood and Affect: Mood normal.        Thought Content: Thought content normal.     Labs reviewed: Recent Labs    03/26/21 0000 04/30/21 0000 06/19/21 0006 06/19/21 0010  NA 140 140 140 141  K 4.4 4.2 4.1 4.0  CL 106 103 104 103  CO2 27* 26* 28  --   GLUCOSE  --   --  108* 106*  BUN '21 16 14 17  '$ CREATININE 0.9 0.9 0.85 0.80  CALCIUM 9.2 9.5 9.8  --    Recent Labs    12/02/20 0000 03/26/21 0000  AST 17 15  ALT 15 12  ALKPHOS 43 47  ALBUMIN 3.6 3.8   Recent Labs    12/02/20 0000 03/26/21 0000 05/01/21 0000 06/19/21 0006 06/19/21 0010  WBC 6.0 6.5 6.9 7.2  --   NEUTROABS 10.70 3,627.00  --  3.5  --   HGB 12.4 11.9* 11.7* 13.2 13.9  HCT 37 35* 35* 41.8 41.0  MCV  --   --   --  105.0*  --   PLT 248 226 221 216  --    Lab Results  Component Value Date   TSH 1.390 07/03/2021   Lab Results  Component Value Date   HGBA1C 5.9 (H) 05/31/2019   Lab Results  Component Value Date   CHOL 169 04/05/2020   HDL 70 04/05/2020   LDLCALC 74 04/05/2020   TRIG 150 (H) 04/05/2020   CHOLHDL 2.4 04/05/2020    Significant Diagnostic Results in last 30 days:  VAS US CAROTID  Result Date: 07/14/2021 Carotid Arterial  Duplex Study Patient Name:  Mayzie Gaymon  Date of Exam:   07/14/2021 Medical Rec #: [id number]      Accession #:    [id number] Date of Birth: 1933/06/01     Patient Gender: F Patient Age:   12 years Exam Location:  [facility] Procedure:      VAS US CAROTID Referring Phys: Marcial Pacas --------------------------------------------------------------------------------  Indications:       Weakness. Risk Factors:      Hypertension, hyperlipidemia, coronary artery disease. Other Factors:     TIA. Comparison Study:  No previous exam noted. Performing Technologist: Bobetta Lime BS, RVT  Examination Guidelines: A complete evaluation includes B-mode imaging, spectral Doppler, color Doppler, and power Doppler as needed of all accessible portions of each vessel. Bilateral testing is considered an integral part of a complete examination. Limited examinations for reoccurring indications may be performed as noted.  Right  Carotid Findings: +----------+--------+--------+--------+------------------+--------+           PSV cm/sEDV cm/sStenosisPlaque DescriptionComments +----------+--------+--------+--------+------------------+--------+ CCA Prox  85      14                                         +----------+--------+--------+--------+------------------+--------+ CCA Distal69      15                                         +----------+--------+--------+--------+------------------+--------+ ICA Prox  81      19              calcific                   +----------+--------+--------+--------+------------------+--------+ ICA Distal68      18                                tortuous +----------+--------+--------+--------+------------------+--------+ ECA       117     17                                         +----------+--------+--------+--------+------------------+--------+ +----------+--------+-------+----------------+-------------------+           PSV cm/sEDV cmsDescribe        Arm  Pressure (mmHG) +----------+--------+-------+----------------+-------------------+ [id number]             Multiphasic, WNL                    +----------+--------+-------+----------------+-------------------+ +---------+--------+--+--------+-+---------+ VertebralPSV cm/s44EDV cm/s8Antegrade +---------+--------+--+--------+-+---------+  Left Carotid Findings: +----------+--------+--------+--------+------------------+--------+           PSV cm/sEDV cm/sStenosisPlaque DescriptionComments +----------+--------+--------+--------+------------------+--------+ CCA Prox  82      13                                         +----------+--------+--------+--------+------------------+--------+ CCA Distal76      14                                         +----------+--------+--------+--------+------------------+--------+ ICA Prox  64      17              calcific                   +----------+--------+--------+--------+------------------+--------+ ICA Distal63      14                                         +----------+--------+--------+--------+------------------+--------+ ECA       94      11              heterogenous               +----------+--------+--------+--------+------------------+--------+ +----------+--------+--------+----------------+-------------------+           PSV cm/sEDV cm/sDescribe        Arm Pressure (  mmHG) +----------+--------+--------+----------------+-------------------+ Subclavian200             Multiphasic, WNL                    +----------+--------+--------+----------------+-------------------+ +---------+--------+--+--------+-+---------+ VertebralPSV cm/s39EDV cm/s9Antegrade +---------+--------+--+--------+-+---------+   Summary: Right Carotid: Velocities in the right ICA are consistent with a 1-39% stenosis. Left Carotid: Velocities in the left ICA are consistent with a 1-39% stenosis. Vertebrals:  Bilateral vertebral arteries  demonstrate antegrade flow. Subclavians: Normal flow hemodynamics were seen in bilateral subclavian              arteries. *See table(s) above for measurements and observations.  Electronically signed by Deitra Mayo MD on 07/14/2021 at 4:24:56 PM.    Final     Assessment/Plan 1. Primary insomnia with Sleep APnea Not using her mask Ativan helping with anxiety at night Will do 0.5 mg of Ativan QHS PRN for 30 days Also Write for Nurses to help with her mask If not able to use it will need Follow up with Dr Maxwell Caul to reval  2. Depression, major, single episode, mild (HCC) Mood much better Taken off Zoloft due to confusion   3. Chronic right-sided low back pain with right-sided sciatica On Tramadol Prn S/p Injection in Jan Will schedule 25 mg Tramadol at night   4. Paroxysmal atrial fibrillation/flutter (HCC) Pn Eliquis and Amiodarone and Toprol    5. Essential hypertension Cozaar and Toprol  6 Possible CVA MRI pending per Neurology On Statin and Eliquis 7 Vit B 12 def B12 Level Less then 400 Will start her in B 12 1000 mcg QD  Family/ staff Communication:   Labs/tests ordered:

## 2021-07-23 DIAGNOSIS — I129 Hypertensive chronic kidney disease with stage 1 through stage 4 chronic kidney disease, or unspecified chronic kidney disease: Secondary | ICD-10-CM | POA: Diagnosis not present

## 2021-07-23 DIAGNOSIS — M6281 Muscle weakness (generalized): Secondary | ICD-10-CM | POA: Diagnosis not present

## 2021-07-23 DIAGNOSIS — R1312 Dysphagia, oropharyngeal phase: Secondary | ICD-10-CM | POA: Diagnosis not present

## 2021-07-23 DIAGNOSIS — R262 Difficulty in walking, not elsewhere classified: Secondary | ICD-10-CM | POA: Diagnosis not present

## 2021-07-23 DIAGNOSIS — R2689 Other abnormalities of gait and mobility: Secondary | ICD-10-CM | POA: Diagnosis not present

## 2021-07-23 DIAGNOSIS — R41841 Cognitive communication deficit: Secondary | ICD-10-CM | POA: Diagnosis not present

## 2021-07-24 ENCOUNTER — Other Ambulatory Visit: Payer: Self-pay | Admitting: Adult Health

## 2021-07-24 DIAGNOSIS — M6281 Muscle weakness (generalized): Secondary | ICD-10-CM | POA: Diagnosis not present

## 2021-07-24 DIAGNOSIS — R262 Difficulty in walking, not elsewhere classified: Secondary | ICD-10-CM | POA: Diagnosis not present

## 2021-07-24 DIAGNOSIS — I129 Hypertensive chronic kidney disease with stage 1 through stage 4 chronic kidney disease, or unspecified chronic kidney disease: Secondary | ICD-10-CM | POA: Diagnosis not present

## 2021-07-24 DIAGNOSIS — R2689 Other abnormalities of gait and mobility: Secondary | ICD-10-CM | POA: Diagnosis not present

## 2021-07-24 DIAGNOSIS — R1312 Dysphagia, oropharyngeal phase: Secondary | ICD-10-CM | POA: Diagnosis not present

## 2021-07-24 DIAGNOSIS — R41841 Cognitive communication deficit: Secondary | ICD-10-CM | POA: Diagnosis not present

## 2021-07-24 MED ORDER — ACETAMINOPHEN 500 MG PO TABS: 1000.0000 mg | ORAL_TABLET | Freq: Two times a day (BID) | ORAL | 0 refills | Status: DC

## 2021-07-24 MED ORDER — TRAMADOL HCL 50 MG PO TABS: 25.0000 mg | ORAL_TABLET | Freq: Every day | ORAL | 0 refills | Status: DC

## 2021-07-25 ENCOUNTER — Ambulatory Visit (HOSPITAL_COMMUNITY): Payer: Medicare Other | Attending: Cardiology

## 2021-07-25 DIAGNOSIS — I129 Hypertensive chronic kidney disease with stage 1 through stage 4 chronic kidney disease, or unspecified chronic kidney disease: Secondary | ICD-10-CM | POA: Diagnosis not present

## 2021-07-25 DIAGNOSIS — I6789 Other cerebrovascular disease: Secondary | ICD-10-CM | POA: Insufficient documentation

## 2021-07-25 DIAGNOSIS — R269 Unspecified abnormalities of gait and mobility: Secondary | ICD-10-CM | POA: Diagnosis not present

## 2021-07-25 DIAGNOSIS — R262 Difficulty in walking, not elsewhere classified: Secondary | ICD-10-CM | POA: Diagnosis not present

## 2021-07-25 DIAGNOSIS — R531 Weakness: Secondary | ICD-10-CM | POA: Diagnosis not present

## 2021-07-25 DIAGNOSIS — I639 Cerebral infarction, unspecified: Secondary | ICD-10-CM | POA: Insufficient documentation

## 2021-07-25 DIAGNOSIS — M6281 Muscle weakness (generalized): Secondary | ICD-10-CM | POA: Diagnosis not present

## 2021-07-25 DIAGNOSIS — R1312 Dysphagia, oropharyngeal phase: Secondary | ICD-10-CM | POA: Diagnosis not present

## 2021-07-25 DIAGNOSIS — R41841 Cognitive communication deficit: Secondary | ICD-10-CM | POA: Diagnosis not present

## 2021-07-25 DIAGNOSIS — R2689 Other abnormalities of gait and mobility: Secondary | ICD-10-CM | POA: Diagnosis not present

## 2021-07-25 LAB — ECHOCARDIOGRAM COMPLETE
Area-P 1/2: 2.5 cm2
S' Lateral: 3.1 cm

## 2021-07-27 ENCOUNTER — Encounter: Payer: Self-pay | Admitting: Neurology

## 2021-07-27 DIAGNOSIS — I129 Hypertensive chronic kidney disease with stage 1 through stage 4 chronic kidney disease, or unspecified chronic kidney disease: Secondary | ICD-10-CM | POA: Diagnosis not present

## 2021-07-27 DIAGNOSIS — R41841 Cognitive communication deficit: Secondary | ICD-10-CM | POA: Diagnosis not present

## 2021-07-27 DIAGNOSIS — R1312 Dysphagia, oropharyngeal phase: Secondary | ICD-10-CM | POA: Diagnosis not present

## 2021-07-27 DIAGNOSIS — R2689 Other abnormalities of gait and mobility: Secondary | ICD-10-CM | POA: Diagnosis not present

## 2021-07-27 DIAGNOSIS — R262 Difficulty in walking, not elsewhere classified: Secondary | ICD-10-CM | POA: Diagnosis not present

## 2021-07-27 DIAGNOSIS — M6281 Muscle weakness (generalized): Secondary | ICD-10-CM | POA: Diagnosis not present

## 2021-07-28 ENCOUNTER — Encounter: Payer: Self-pay | Admitting: *Deleted

## 2021-07-28 DIAGNOSIS — M6281 Muscle weakness (generalized): Secondary | ICD-10-CM | POA: Diagnosis not present

## 2021-07-28 DIAGNOSIS — R262 Difficulty in walking, not elsewhere classified: Secondary | ICD-10-CM | POA: Diagnosis not present

## 2021-07-28 DIAGNOSIS — R1312 Dysphagia, oropharyngeal phase: Secondary | ICD-10-CM | POA: Diagnosis not present

## 2021-07-28 DIAGNOSIS — R2689 Other abnormalities of gait and mobility: Secondary | ICD-10-CM | POA: Diagnosis not present

## 2021-07-28 DIAGNOSIS — R41841 Cognitive communication deficit: Secondary | ICD-10-CM | POA: Diagnosis not present

## 2021-07-28 DIAGNOSIS — I129 Hypertensive chronic kidney disease with stage 1 through stage 4 chronic kidney disease, or unspecified chronic kidney disease: Secondary | ICD-10-CM | POA: Diagnosis not present

## 2021-07-29 DIAGNOSIS — R2689 Other abnormalities of gait and mobility: Secondary | ICD-10-CM | POA: Diagnosis not present

## 2021-07-29 DIAGNOSIS — R262 Difficulty in walking, not elsewhere classified: Secondary | ICD-10-CM | POA: Diagnosis not present

## 2021-07-29 DIAGNOSIS — R1312 Dysphagia, oropharyngeal phase: Secondary | ICD-10-CM | POA: Diagnosis not present

## 2021-07-29 DIAGNOSIS — M6281 Muscle weakness (generalized): Secondary | ICD-10-CM | POA: Diagnosis not present

## 2021-07-29 DIAGNOSIS — I129 Hypertensive chronic kidney disease with stage 1 through stage 4 chronic kidney disease, or unspecified chronic kidney disease: Secondary | ICD-10-CM | POA: Diagnosis not present

## 2021-07-29 DIAGNOSIS — R41841 Cognitive communication deficit: Secondary | ICD-10-CM | POA: Diagnosis not present

## 2021-07-30 DIAGNOSIS — R41841 Cognitive communication deficit: Secondary | ICD-10-CM | POA: Diagnosis not present

## 2021-07-30 DIAGNOSIS — R2689 Other abnormalities of gait and mobility: Secondary | ICD-10-CM | POA: Diagnosis not present

## 2021-07-30 DIAGNOSIS — R1312 Dysphagia, oropharyngeal phase: Secondary | ICD-10-CM | POA: Diagnosis not present

## 2021-07-30 DIAGNOSIS — R262 Difficulty in walking, not elsewhere classified: Secondary | ICD-10-CM | POA: Diagnosis not present

## 2021-07-30 DIAGNOSIS — I129 Hypertensive chronic kidney disease with stage 1 through stage 4 chronic kidney disease, or unspecified chronic kidney disease: Secondary | ICD-10-CM | POA: Diagnosis not present

## 2021-07-30 DIAGNOSIS — M6281 Muscle weakness (generalized): Secondary | ICD-10-CM | POA: Diagnosis not present

## 2021-07-31 DIAGNOSIS — I129 Hypertensive chronic kidney disease with stage 1 through stage 4 chronic kidney disease, or unspecified chronic kidney disease: Secondary | ICD-10-CM | POA: Diagnosis not present

## 2021-07-31 DIAGNOSIS — R2689 Other abnormalities of gait and mobility: Secondary | ICD-10-CM | POA: Diagnosis not present

## 2021-07-31 DIAGNOSIS — R262 Difficulty in walking, not elsewhere classified: Secondary | ICD-10-CM | POA: Diagnosis not present

## 2021-07-31 DIAGNOSIS — R1312 Dysphagia, oropharyngeal phase: Secondary | ICD-10-CM | POA: Diagnosis not present

## 2021-07-31 DIAGNOSIS — R41841 Cognitive communication deficit: Secondary | ICD-10-CM | POA: Diagnosis not present

## 2021-07-31 DIAGNOSIS — M6281 Muscle weakness (generalized): Secondary | ICD-10-CM | POA: Diagnosis not present

## 2021-08-01 ENCOUNTER — Ambulatory Visit (HOSPITAL_COMMUNITY)
Admission: RE | Admit: 2021-08-01 | Discharge: 2021-08-01 | Disposition: A | Discharge status: Home / Self Care | Payer: Medicare Other | Source: Ambulatory Visit | Attending: Neurology | Admitting: Neurology

## 2021-08-01 ENCOUNTER — Telehealth: Payer: Self-pay | Admitting: Neurology

## 2021-08-01 DIAGNOSIS — R41841 Cognitive communication deficit: Secondary | ICD-10-CM | POA: Diagnosis not present

## 2021-08-01 DIAGNOSIS — G319 Degenerative disease of nervous system, unspecified: Secondary | ICD-10-CM | POA: Diagnosis not present

## 2021-08-01 DIAGNOSIS — R269 Unspecified abnormalities of gait and mobility: Secondary | ICD-10-CM | POA: Insufficient documentation

## 2021-08-01 DIAGNOSIS — R262 Difficulty in walking, not elsewhere classified: Secondary | ICD-10-CM | POA: Diagnosis not present

## 2021-08-01 DIAGNOSIS — R1312 Dysphagia, oropharyngeal phase: Secondary | ICD-10-CM | POA: Diagnosis not present

## 2021-08-01 DIAGNOSIS — I129 Hypertensive chronic kidney disease with stage 1 through stage 4 chronic kidney disease, or unspecified chronic kidney disease: Secondary | ICD-10-CM | POA: Diagnosis not present

## 2021-08-01 DIAGNOSIS — R531 Weakness: Secondary | ICD-10-CM | POA: Insufficient documentation

## 2021-08-01 DIAGNOSIS — M6281 Muscle weakness (generalized): Secondary | ICD-10-CM | POA: Diagnosis not present

## 2021-08-01 DIAGNOSIS — R2689 Other abnormalities of gait and mobility: Secondary | ICD-10-CM | POA: Diagnosis not present

## 2021-08-01 NOTE — Telephone Encounter (Signed)
Please call patient, MRI of the brain showed mild to moderate generalized atrophy, moderate small vessel disease  Above findings might contributed to her complaints of gait abnormality,  Please keep follow-up appointment as scheduled,     IMPRESSION: 1. Mildly motion degraded exam. 2. Moderate to advanced chronic small vessel ischemic changes within the cerebral white matter. 3. Subcentimeter chronic infarct within the right cerebellar hemisphere. 4. Mild-to-moderate generalized cerebral atrophy. 5. Mild bilateral ethmoid sinus mucosal thickening.

## 2021-08-02 ENCOUNTER — Encounter: Payer: Self-pay | Admitting: Dermatology

## 2021-08-02 NOTE — Progress Notes (Signed)
   Follow-Up Visit   Subjective  Stephanie Shea is a 88 y.o. female who presents for the following: Follow-up (Left forearm-SCC treated in sept 2022./Also patient has a new lesion on right ankle. History of non mole skin cancer. ).  History of skin cancer, new spot on right ankle. Location:  Duration:  Quality:  Associated Signs/Symptoms: Modifying Factors:  Severity:  Timing: Context:   Objective  Well appearing patient in no apparent distress; mood and affect are within normal limits. Mid Back Several brown textured 4 to 8 mm flattopped papules  Neck - Anterior No sign recurrence CIS left neck  Right Ankle - Anterior 6 mm pink crust could represent a superficial carcinoma or inflamed keratosis.  This would be a challenging spot to heal.    All sun exposed areas plus back examined.   Assessment & Plan    Seborrheic keratosis Mid Back  Leave if stable  Personal history of skin cancer Neck - Anterior  Recheck as needed change  Inflamed seborrheic keratosis Right Ankle - Anterior  Return for possible biopsy if there is growth or bleeding.      I, Lavonna Monarch, MD, have reviewed all documentation for this visit.  The documentation on 08/02/21 for the exam, diagnosis, procedures, and orders are all accurate and complete.

## 2021-08-03 DIAGNOSIS — R262 Difficulty in walking, not elsewhere classified: Secondary | ICD-10-CM | POA: Diagnosis not present

## 2021-08-03 DIAGNOSIS — R1312 Dysphagia, oropharyngeal phase: Secondary | ICD-10-CM | POA: Diagnosis not present

## 2021-08-03 DIAGNOSIS — I129 Hypertensive chronic kidney disease with stage 1 through stage 4 chronic kidney disease, or unspecified chronic kidney disease: Secondary | ICD-10-CM | POA: Diagnosis not present

## 2021-08-03 DIAGNOSIS — R41841 Cognitive communication deficit: Secondary | ICD-10-CM | POA: Diagnosis not present

## 2021-08-03 DIAGNOSIS — M6281 Muscle weakness (generalized): Secondary | ICD-10-CM | POA: Diagnosis not present

## 2021-08-03 DIAGNOSIS — R2689 Other abnormalities of gait and mobility: Secondary | ICD-10-CM | POA: Diagnosis not present

## 2021-08-04 NOTE — Telephone Encounter (Signed)
I spoke to the patient who provided verbal permission for me to discuss the results with her daughter, Johnny Bridge (both on phone on a 3-way call). Her son is POA but Morey Hummingbird is the primary caregiver. She is going to get added to the [facility].   She verbalized understanding of the findings. States her mother is a resident at [facility] She participates in activities and PT daily.   The patient has a pending epidural steroid injection on 08/07/21 w/ Dr. Mina Marble ([place]).  She will be attending the next follow up with her mother.

## 2021-08-04 NOTE — Telephone Encounter (Signed)
[facility]) Was told by the daughter pt suppose stop certain medications before injections. Would like a call from the nurse to verify.  Contact info: [phone] Ext [phone]

## 2021-08-05 DIAGNOSIS — R1312 Dysphagia, oropharyngeal phase: Secondary | ICD-10-CM | POA: Diagnosis not present

## 2021-08-05 DIAGNOSIS — R2689 Other abnormalities of gait and mobility: Secondary | ICD-10-CM | POA: Diagnosis not present

## 2021-08-05 DIAGNOSIS — R262 Difficulty in walking, not elsewhere classified: Secondary | ICD-10-CM | POA: Diagnosis not present

## 2021-08-05 DIAGNOSIS — M6281 Muscle weakness (generalized): Secondary | ICD-10-CM | POA: Diagnosis not present

## 2021-08-05 DIAGNOSIS — R41841 Cognitive communication deficit: Secondary | ICD-10-CM | POA: Diagnosis not present

## 2021-08-05 DIAGNOSIS — I129 Hypertensive chronic kidney disease with stage 1 through stage 4 chronic kidney disease, or unspecified chronic kidney disease: Secondary | ICD-10-CM | POA: Diagnosis not present

## 2021-08-05 NOTE — Telephone Encounter (Signed)
During our conference call with her daughter yesterday, it was noted she has an ESI pending on 08/07/21. The procedure will be performed by Dr. Mina Marble at [facility].   I returned the call to [facility] and left message on the provider line. They will need to contact Dr. Mina Marble for pre-procedure instructions. Provided our number, if case there are further questions.

## 2021-08-06 DIAGNOSIS — R41841 Cognitive communication deficit: Secondary | ICD-10-CM | POA: Diagnosis not present

## 2021-08-06 DIAGNOSIS — I129 Hypertensive chronic kidney disease with stage 1 through stage 4 chronic kidney disease, or unspecified chronic kidney disease: Secondary | ICD-10-CM | POA: Diagnosis not present

## 2021-08-06 DIAGNOSIS — R2689 Other abnormalities of gait and mobility: Secondary | ICD-10-CM | POA: Diagnosis not present

## 2021-08-06 DIAGNOSIS — M6281 Muscle weakness (generalized): Secondary | ICD-10-CM | POA: Diagnosis not present

## 2021-08-06 DIAGNOSIS — R1312 Dysphagia, oropharyngeal phase: Secondary | ICD-10-CM | POA: Diagnosis not present

## 2021-08-06 DIAGNOSIS — R262 Difficulty in walking, not elsewhere classified: Secondary | ICD-10-CM | POA: Diagnosis not present

## 2021-08-07 DIAGNOSIS — R1312 Dysphagia, oropharyngeal phase: Secondary | ICD-10-CM | POA: Diagnosis not present

## 2021-08-07 DIAGNOSIS — M6281 Muscle weakness (generalized): Secondary | ICD-10-CM | POA: Diagnosis not present

## 2021-08-07 DIAGNOSIS — I129 Hypertensive chronic kidney disease with stage 1 through stage 4 chronic kidney disease, or unspecified chronic kidney disease: Secondary | ICD-10-CM | POA: Diagnosis not present

## 2021-08-07 DIAGNOSIS — R2689 Other abnormalities of gait and mobility: Secondary | ICD-10-CM | POA: Diagnosis not present

## 2021-08-07 DIAGNOSIS — R262 Difficulty in walking, not elsewhere classified: Secondary | ICD-10-CM | POA: Diagnosis not present

## 2021-08-07 DIAGNOSIS — R41841 Cognitive communication deficit: Secondary | ICD-10-CM | POA: Diagnosis not present

## 2021-08-08 DIAGNOSIS — R262 Difficulty in walking, not elsewhere classified: Secondary | ICD-10-CM | POA: Diagnosis not present

## 2021-08-08 DIAGNOSIS — R2689 Other abnormalities of gait and mobility: Secondary | ICD-10-CM | POA: Diagnosis not present

## 2021-08-08 DIAGNOSIS — M6281 Muscle weakness (generalized): Secondary | ICD-10-CM | POA: Diagnosis not present

## 2021-08-08 DIAGNOSIS — R41841 Cognitive communication deficit: Secondary | ICD-10-CM | POA: Diagnosis not present

## 2021-08-08 DIAGNOSIS — I129 Hypertensive chronic kidney disease with stage 1 through stage 4 chronic kidney disease, or unspecified chronic kidney disease: Secondary | ICD-10-CM | POA: Diagnosis not present

## 2021-08-08 DIAGNOSIS — R1312 Dysphagia, oropharyngeal phase: Secondary | ICD-10-CM | POA: Diagnosis not present

## 2021-08-11 DIAGNOSIS — M6281 Muscle weakness (generalized): Secondary | ICD-10-CM | POA: Diagnosis not present

## 2021-08-11 DIAGNOSIS — R2689 Other abnormalities of gait and mobility: Secondary | ICD-10-CM | POA: Diagnosis not present

## 2021-08-11 DIAGNOSIS — R1312 Dysphagia, oropharyngeal phase: Secondary | ICD-10-CM | POA: Diagnosis not present

## 2021-08-11 DIAGNOSIS — R262 Difficulty in walking, not elsewhere classified: Secondary | ICD-10-CM | POA: Diagnosis not present

## 2021-08-11 DIAGNOSIS — R41841 Cognitive communication deficit: Secondary | ICD-10-CM | POA: Diagnosis not present

## 2021-08-11 DIAGNOSIS — I129 Hypertensive chronic kidney disease with stage 1 through stage 4 chronic kidney disease, or unspecified chronic kidney disease: Secondary | ICD-10-CM | POA: Diagnosis not present

## 2021-08-12 ENCOUNTER — Non-Acute Institutional Stay (SKILLED_NURSING_FACILITY): Payer: Medicare Other | Admitting: Internal Medicine

## 2021-08-12 ENCOUNTER — Encounter: Payer: Self-pay | Admitting: Internal Medicine

## 2021-08-12 DIAGNOSIS — R29898 Other symptoms and signs involving the musculoskeletal system: Secondary | ICD-10-CM | POA: Diagnosis not present

## 2021-08-12 DIAGNOSIS — Z9181 History of falling: Secondary | ICD-10-CM | POA: Diagnosis not present

## 2021-08-12 DIAGNOSIS — I129 Hypertensive chronic kidney disease with stage 1 through stage 4 chronic kidney disease, or unspecified chronic kidney disease: Secondary | ICD-10-CM | POA: Diagnosis not present

## 2021-08-12 DIAGNOSIS — R2689 Other abnormalities of gait and mobility: Secondary | ICD-10-CM | POA: Diagnosis not present

## 2021-08-12 DIAGNOSIS — R269 Unspecified abnormalities of gait and mobility: Secondary | ICD-10-CM | POA: Diagnosis not present

## 2021-08-12 DIAGNOSIS — R1312 Dysphagia, oropharyngeal phase: Secondary | ICD-10-CM | POA: Diagnosis not present

## 2021-08-12 DIAGNOSIS — R41841 Cognitive communication deficit: Secondary | ICD-10-CM | POA: Diagnosis not present

## 2021-08-12 DIAGNOSIS — M6281 Muscle weakness (generalized): Secondary | ICD-10-CM | POA: Diagnosis not present

## 2021-08-12 DIAGNOSIS — R600 Localized edema: Secondary | ICD-10-CM

## 2021-08-12 DIAGNOSIS — R262 Difficulty in walking, not elsewhere classified: Secondary | ICD-10-CM | POA: Diagnosis not present

## 2021-08-12 DIAGNOSIS — N189 Chronic kidney disease, unspecified: Secondary | ICD-10-CM | POA: Diagnosis not present

## 2021-08-12 NOTE — Progress Notes (Signed)
Location:   [facility] Room Number: 34 Place of Service:  SNF [phone]) Provider:  Veleta Miners MD   Stephanie Dad, MD  Patient Care Team: Stephanie Dad, MD as PCP - General (Internal Medicine) Leonie Man, MD as PCP - Cardiology (Cardiology) Mast, [name], NP as Nurse Practitioner (Internal Medicine) Lavonna Monarch, MD as Consulting Physician (Dermatology)  Extended Emergency Contact Information Primary Emergency Contact: [place] Mobile Phone: [phone] Relation: Daughter Secondary Emergency Contact: Weeks,Tiffany Mobile Phone: [phone] Relation: Daughter Preferred language: Cleophus Molt Interpreter needed? No  Code Status:  DNR Goals of care: Advanced Directive information    08/12/2021    2:59 PM  Advanced Directives  Does Patient Have a Medical Advance Directive? Yes  Type of Paramedic of [place];Out of facility DNR (pink MOST or yellow form);Living will  Does patient want to make changes to medical advance directive? No - Patient declined  Copy of [place] in Chart? Yes - validated most recent copy scanned in chart (See row information)  Pre-existing out of facility DNR order (yellow form or pink MOST form) Yellow form placed in chart (order not valid for inpatient use);Pink MOST form placed in chart (order not valid for inpatient use)     Chief Complaint  Patient presents with   Acute Visit    Edema    HPI:  Pt is a 88 y.o. female seen today for an acute visit for LE edema  Patient lives SNF    Patient has a history of CAD s/p CABG in 2002  On Medical Management EF 65% PAF diagnosed in 4/21 on Eliquis Sleep Apnea uses CPAP H/I Skin Cancer H/o Breast Cancer s/p Mastectomy  HTN,HLD and Arthritis Cognitive impairment  Recent Diagnosis of MRI chronic infarct within the right cerebellar hemisphere.and Moderate Cerebral Atrophy Leading to Unstable gait   Patient also has  insomnia ,Right shoulder pain and Low back pain  Patient seen today for lower extremity edema.  Used to be on Lasix PRN before Denies any chest pain shortness of breath or cough.  No pain in his legs. Wt Readings from Last 3 Encounters:  08/12/21 187 lb (84.8 kg)  07/22/21 186 lb (84.4 kg)  07/04/21 183 lb 11.2 oz (83.3 kg)         Past Medical History:  Diagnosis Date   Atherosclerosis of native coronary artery 2001   LAD & Cx disease -- BMS PCI in 2001; CABG 2002.  PCI in 2006 (per-op TAH)   Atrial fibrillation and flutter ([place]) 04/2019   New diagnosis-major complaint was chest tightness and dyspnea.   Breast cancer ([place])    Cervix dysplasia    GERD (gastroesophageal reflux disease)    Heart attack ([employer]) 2002, 2006   Both events reportedly were perioperatively. She has no recollection. Initial PCI was related to angina symptoms (bilateral arm pain)   High cholesterol    Hypertension    Lumbosacral spinal stenosis    Osteoarthritis    Skin cancer    Sleep apnea    Uses CPAP faithfully   Squamous cell carcinoma of skin 02/12/2020   in situ- left neck-anterior (CX35FU)   Past Surgical History:  Procedure Laterality Date   ABDOMINAL HYSTERECTOMY     bone density     CARDIAC CATHETERIZATION  2001    Dr. Glade Lloyd [facility]) 2 vessel CAD involving LAD-D1 and distal Cx-OM. Appearance of dilated tapered left main with no significant atherosclerosis on  IVUS --  BMS PCI of pLAD ([facility] BMS 3.0 mm x 15 mm) & with PTCA of ostial D1, PTCA of dLAD.  BMS PCI pCx [facility] BMS 3.5 mm x 11 mm) - unable to reopen dCx-OM (probable distal Atheroembolic occlusion.  Normal LVEF.   COLONOSCOPY     CORONARY ARTERY BYPASS GRAFT  2002   Unsure of details [facility] in [facility])   [place]  2001   () [facility] BMS PCI pLAD (3.0 mm x 15 mm) -PTCA of jailed D1 as well as distal LAD; pCx ([facility] BMS 3.5 mm x 11 mm) -unable to open distalCx-OM -thromboembolic occlusion    DIAGNOSTIC MAMMOGRAM     LEFT HEART CATH AND CORS/GRAFTS ANGIOGRAPHY N/A 04/25/2019   Procedure: LEFT HEART CATH AND CORS/GRAFTS ANGIOGRAPHY;  Surgeon: Troy Sine, MD;  Location: [facility] INVASIVE CV LAB;;; ost-prox LCx stent @ OM1 ostium =100% CTO, Ost-prox LAD stent ~30% ISR, Ost D1 CTO, prox RCA ~30%. SCG-OM2 patent with ostial stent 30%, LIMA-LAD patent; CTO of SVG-D1.    left knee replacement     MASTECTOMY     NM MYOVIEW LTD  12/2014; 2018   Cardiologist (Dr. Kellie Simmering.  [place], [place] ph# [phone]) -->LEXISCAN: Nonischemic, "normal "   pap smear     tonsilectomy     TRANSTHORACIC ECHOCARDIOGRAM  12/2014   Echocardiogram 12/25/14- normal LVEF and systolic function, EF 44-01%, mild grade 1 diastolic dysfunction   TRANSTHORACIC ECHOCARDIOGRAM  04/24/2019   EF 65 to 70%.  Moderate septal LVH.  GR 1 DD-elevated EDP.  Normal PAP.  Relatively normal valves.  No aortic stenosis.    Allergies  Allergen Reactions   Codeine Nausea Only    Allergies as of 08/12/2021       Reactions   Codeine Nausea Only        Medication List        Accurate as of August 12, 2021  3:00 PM. If you have any questions, ask your nurse or doctor.          acetaminophen 500 MG tablet Commonly known as: TYLENOL Take 2 tablets (1,000 mg total) by mouth 2 (two) times daily.   amiodarone 100 MG tablet Commonly known as: PACERONE Take 100 mg by mouth daily.   apixaban 5 MG Tabs tablet Commonly known as: Eliquis Take 1 tablet (5 mg total) by mouth 2 (two) times daily.   atorvastatin 80 MG tablet Commonly known as: LIPITOR Take 1 tablet (80 mg total) by mouth daily.   cyanocobalamin 1000 MCG tablet Commonly known as: VITAMIN B12 Take 1,000 mcg by mouth daily.   docusate sodium 100 MG capsule Commonly known as: COLACE Take 1 capsule (100 mg total) by mouth daily.   fexofenadine 180 MG tablet Commonly known as: ALLEGRA Take 1 tablet (180 mg total) by mouth daily as needed for allergies or  rhinitis.   fluticasone 50 MCG/ACT nasal spray Commonly known as: FLONASE Place 1 spray into both nostrils daily as needed for allergies or rhinitis.   LORazepam 0.5 MG tablet Commonly known as: ATIVAN Take 0.5 mg by mouth at bedtime as needed for anxiety.   losartan 25 MG tablet Commonly known as: COZAAR Take 25 mg by mouth daily.   melatonin 5 MG Tabs Take 5 mg by mouth at bedtime.   methocarbamol 500 MG tablet Commonly known as: ROBAXIN Take 250 mg by mouth as needed.   metoprolol succinate 50 MG 24 hr tablet Commonly known as: TOPROL-XL  Take 1 tablet (50 mg total) by mouth daily. Take with or immediately following a meal.   nitroGLYCERIN 0.4 MG SL tablet Commonly known as: NITROSTAT Place 0.4 mg under the tongue as needed for chest pain (Every 5 minutes x 3 doses).   nystatin powder Commonly known as: MYCOSTATIN/NYSTOP Apply 1 Application topically 2 (two) times daily.   omeprazole 20 MG tablet Commonly known as: PRILOSEC OTC Take 1 tablet (20 mg total) by mouth daily as needed.   polyethylene glycol 17 g packet Commonly known as: MIRALAX / GLYCOLAX Take 17 g by mouth daily. Give with 4 oz of water.   Salonpas Pain Relieving 4 % Generic drug: lidocaine Place 1 patch onto the skin daily.   senna-docusate 8.6-50 MG tablet Commonly known as: Senokot-S Take 2 tablets by mouth at bedtime.   traMADol 50 MG tablet Commonly known as: Ultram Take 1 tablet (50 mg total) by mouth every 6 (six) hours as needed.   traMADol 50 MG tablet Commonly known as: ULTRAM Take 0.5 tablets (25 mg total) by mouth at bedtime.   Vitamin D3 50 MCG (2000 UT) Tabs Take 2,000 Units by mouth daily after breakfast.   zinc oxide 20 % ointment Apply 1 application topically 3 (three) times daily as needed for irritation. Apply to buttocks after every incontinent episode and as needed for redness.        Review of Systems  Constitutional:  Negative for activity change and appetite  change.  HENT: Negative.    Respiratory:  Negative for cough and shortness of breath.   Cardiovascular:  Negative for leg swelling.  Gastrointestinal:  Negative for constipation.  Genitourinary: Negative.   Musculoskeletal:  Positive for gait problem. Negative for arthralgias and myalgias.  Skin: Negative.   Neurological:  Positive for weakness. Negative for dizziness.  Psychiatric/Behavioral:  Positive for confusion and sleep disturbance. Negative for dysphoric mood.     Immunization History  Administered Date(s) Administered   Fluad Quad(high Dose 65+) 10/28/2020   Influenza, High Dose Seasonal PF 10/21/2016, 09/24/2017, 10/25/2019   Influenza-Unspecified 10/03/2011, 10/04/2012, 01/13/2016   Moderna SARS-COV2 Booster Vaccination 11/21/2019, 06/11/2020   Moderna Sars-Covid-2 Vaccination 01/16/2019, 02/13/2019, 03/13/2019   Pneumococcal-Unspecified 01/13/2016   Tetanus 12/01/2017   Zoster Recombinat (Shingrix) 08/24/2018, 02/22/2020   Pertinent  Health Maintenance Due  Topic Date Due   HEMOGLOBIN A1C  12/01/2019   OPHTHALMOLOGY EXAM  05/30/2021   INFLUENZA VACCINE  08/12/2021   FOOT EXAM  10/17/2021   DEXA SCAN  Completed      05/25/2019    1:28 PM 09/12/2020    2:57 PM 10/17/2020    2:53 PM 02/07/2021   12:47 PM 06/19/2021   12:11 AM  Fall Risk  Falls in the past year? 0 0 0 1   Was there an injury with Fall?  0 0 0   Fall Risk Category Calculator  0 0 1   Fall Risk Category  Low Low Low   Patient Fall Risk Level  Low fall risk Low fall risk Moderate fall risk Moderate fall risk  Patient at Risk for Falls Due to  No Fall Risks No Fall Risks History of fall(s);Impaired balance/gait;Impaired mobility;Orthopedic patient   Patient at Risk for Falls Due to - Comments    spinal stenosis of lumbar   Fall risk Follow up  Falls evaluation completed Falls evaluation completed Falls evaluation completed;Education provided;Falls prevention discussed    Functional Status Survey:     Vitals:   08/12/21 1444  BP: (!) 130/59  Pulse: 96  Resp: 17  Temp: (!) 96.9 F (36.1 C)  SpO2: 92%  Weight: 187 lb (84.8 kg)  Height: '5\' 7"'$  (1.702 m)   Body mass index is 29.29 kg/m. Physical Exam Vitals reviewed.  Constitutional:      Appearance: Normal appearance.  HENT:     Head: Normocephalic.     Nose: Nose normal.     Mouth/Throat:     Mouth: Mucous membranes are moist.     Pharynx: Oropharynx is clear.  Eyes:     Pupils: Pupils are equal, round, and reactive to light.  Cardiovascular:     Rate and Rhythm: Normal rate and regular rhythm.     Pulses: Normal pulses.     Heart sounds: Normal heart sounds. No murmur heard. Pulmonary:     Effort: Pulmonary effort is normal.     Breath sounds: Normal breath sounds.  Abdominal:     General: Abdomen is flat. Bowel sounds are normal.     Palpations: Abdomen is soft.  Musculoskeletal:        General: Swelling present.     Cervical back: Neck supple.  Skin:    General: Skin is warm.  Neurological:     General: No focal deficit present.     Mental Status: She is alert.  Psychiatric:        Mood and Affect: Mood normal.        Thought Content: Thought content normal.     Labs reviewed: Recent Labs    03/26/21 0000 04/30/21 0000 06/19/21 0006 06/19/21 0010  NA 140 140 140 141  K 4.4 4.2 4.1 4.0  CL 106 103 104 103  CO2 27* 26* 28  --   GLUCOSE  --   --  108* 106*  BUN '21 16 14 17  '$ CREATININE 0.9 0.9 0.85 0.80  CALCIUM 9.2 9.5 9.8  --    Recent Labs    12/02/20 0000 03/26/21 0000  AST 17 15  ALT 15 12  ALKPHOS 43 47  ALBUMIN 3.6 3.8   Recent Labs    12/02/20 0000 03/26/21 0000 05/01/21 0000 06/19/21 0006 06/19/21 0010  WBC 6.0 6.5 6.9 7.2  --   NEUTROABS 10.70 3,627.00  --  3.5  --   HGB 12.4 11.9* 11.7* 13.2 13.9  HCT 37 35* 35* 41.8 41.0  MCV  --   --   --  105.0*  --   PLT 248 226 221 216  --    Lab Results  Component Value Date   TSH 1.390 07/03/2021   Lab Results  Component  Value Date   HGBA1C 5.9 (H) 05/31/2019   Lab Results  Component Value Date   CHOL 169 04/05/2020   HDL 70 04/05/2020   LDLCALC 74 04/05/2020   TRIG 150 (H) 04/05/2020   CHOLHDL 2.4 04/05/2020    Significant Diagnostic Results in last 30 days:  MR BRAIN WO CONTRAST  Result Date: 08/01/2021 CLINICAL DATA:  Provided history: Gait abnormality. Right-sided weakness. Neuro deficit, acute, stroke suspected. EXAM: MRI HEAD WITHOUT CONTRAST TECHNIQUE: Multiplanar, multiecho pulse sequences of the brain and surrounding structures were obtained without intravenous contrast. COMPARISON:  Head CT 06/19/2021. FINDINGS: Mild intermittent motion degradation. Brain: Mild-to-moderate generalized cerebral atrophy. Moderate to advanced multifocal T2 FLAIR hyperintense signal abnormality within the cerebral white matter, nonspecific but compatible chronic small vessel disease. This includes multiple small chronic lacunar infarcts within the bilateral cerebral hemispheric white matter. Subcentimeter chronic infarct  within the right cerebellar hemisphere. There is no acute infarct. No evidence of an intracranial mass. No chronic intracranial blood products. No extra-axial fluid collection. No midline shift. Vascular: Maintained flow voids within the proximal large arterial vessels. Skull and upper cervical spine: No focal suspicious marrow lesion. Sinuses/Orbits: No mass or acute finding within the imaged orbits. Prior bilateral ocular lens replacement. Mild mucosal thickening within the bilateral ethmoid sinuses. IMPRESSION: 1. Mildly motion degraded exam. 2. Moderate to advanced chronic small vessel ischemic changes within the cerebral white matter. 3. Subcentimeter chronic infarct within the right cerebellar hemisphere. 4. Mild-to-moderate generalized cerebral atrophy. 5. Mild bilateral ethmoid sinus mucosal thickening. Electronically Signed   By: Kellie Simmering D.O.   On: 08/01/2021 12:41   ECHOCARDIOGRAM  COMPLETE  Result Date: 07/25/2021    ECHOCARDIOGRAM REPORT   Patient Name:   Kinzleigh Sportsman Date of Exam: 07/25/2021 Medical Rec #:  [id number]     Height:       67.0 in Accession #:    [id number]    Weight:       186.0 lb Date of Birth:  September 02, 1933    BSA:          1.961 m Patient Age:    48 years      BP:           118/64 mmHg Patient Gender: F             HR:           66 bpm. Exam Location:  [address] Procedure: 2D Echo, Color Doppler, Cardiac Doppler and 3D Echo Indications:    Stroke I63.9  History:        Patient has prior history of Echocardiogram examinations, most                 recent 04/24/2019. Prior CABG; Arrythmias:Atrial Fibrillation.  Sonographer:    Mikki Santee RDCS Referring Phys: [place]  1. Left ventricular ejection fraction, by estimation, is 60 to 65%. The left ventricle has normal function. The left ventricle has no regional wall motion abnormalities. There is moderate asymmetric left ventricular hypertrophy of the basal-septal segment. Left ventricular diastolic parameters are indeterminate.  2. Right ventricular systolic function was not well visualized. The right ventricular size is not well visualized. Tricuspid regurgitation signal is inadequate for assessing PA pressure.  3. The mitral valve is normal in structure. No evidence of mitral valve regurgitation. No evidence of mitral stenosis.  4. The aortic valve was not well visualized. Aortic valve regurgitation is not visualized. Aortic valve sclerosis/calcification is present, without any evidence of aortic stenosis.  5. The inferior vena cava is normal in size with greater than 50% respiratory variability, suggesting right atrial pressure of 3 mmHg. FINDINGS  Left Ventricle: Left ventricular ejection fraction, by estimation, is 60 to 65%. The left ventricle has normal function. The left ventricle has no regional wall motion abnormalities. The left ventricular internal cavity size was small. There is  moderate  asymmetric left ventricular hypertrophy of the basal-septal segment. Left ventricular diastolic parameters are indeterminate. Right Ventricle: The right ventricular size is not well visualized. Right vetricular wall thickness was not well visualized. Right ventricular systolic function was not well visualized. Tricuspid regurgitation signal is inadequate for assessing PA pressure. Left Atrium: Left atrial size was normal in size. Right Atrium: Right atrial size was normal in size. Pericardium: There is no evidence of pericardial effusion. Presence of epicardial fat layer. Mitral Valve: The  mitral valve is normal in structure. No evidence of mitral valve regurgitation. No evidence of mitral valve stenosis. Tricuspid Valve: The tricuspid valve is normal in structure. Tricuspid valve regurgitation is trivial. Aortic Valve: The aortic valve was not well visualized. Aortic valve regurgitation is not visualized. Aortic valve sclerosis/calcification is present, without any evidence of aortic stenosis. Pulmonic Valve: The pulmonic valve was not well visualized. Pulmonic valve regurgitation is not visualized. Aorta: The aortic root is normal in size and structure. Venous: The inferior vena cava is normal in size with greater than 50% respiratory variability, suggesting right atrial pressure of 3 mmHg. IAS/Shunts: The interatrial septum was not well visualized.  LEFT VENTRICLE PLAX 2D LVIDd:         3.70 cm   Diastology LVIDs:         3.10 cm   LV e' medial:    4.30 cm/s LV PW:         1.00 cm   LV E/e' medial:  15.6 LV IVS:        1.30 cm   LV e' lateral:   4.38 cm/s LVOT diam:     2.00 cm   LV E/e' lateral: 15.3 LV SV:         65 LV SV Index:   33 LVOT Area:     3.14 cm                           3D Volume EF:                          3D EF:        59 %                          LV EDV:       117 ml                          LV ESV:       48 ml                          LV SV:        69 ml RIGHT VENTRICLE RV Basal  diam:  2.90 cm RV S prime:     9.65 cm/s TAPSE (M-mode): 1.2 cm LEFT ATRIUM             Index        RIGHT ATRIUM           Index LA diam:        3.50 cm 1.78 cm/m   RA Area:     13.90 cm LA Vol (A2C):   35.8 ml 18.26 ml/m  RA Volume:   31.20 ml  15.91 ml/m LA Vol (A4C):   41.6 ml 21.22 ml/m LA Biplane Vol: 40.1 ml 20.45 ml/m  AORTIC VALVE LVOT Vmax:   95.40 cm/s LVOT Vmean:  63.400 cm/s LVOT VTI:    0.207 m  AORTA Ao Root diam: 2.80 cm MITRAL VALVE MV Area (PHT): 2.50 cm     SHUNTS MV Decel Time: 303 msec     Systemic VTI:  0.21 m MV E velocity: 67.10 cm/s   Systemic Diam: 2.00 cm MV A velocity: 106.00 cm/s MV E/A ratio:  0.63 Oswaldo Milian MD Electronically signed by Oswaldo Milian  MD Signature Date/Time: 07/25/2021/5:11:20 PM    Final    VAS US CAROTID  Result Date: 07/14/2021 Carotid Arterial Duplex Study Patient Name:  Morghan Penniman  Date of Exam:   07/14/2021 Medical Rec #: [id number]      Accession #:    [id number] Date of Birth: 21-Aug-1933     Patient Gender: F Patient Age:   77 years Exam Location:  [facility] Procedure:      VAS US CAROTID Referring Phys: Marcial Pacas --------------------------------------------------------------------------------  Indications:       Weakness. Risk Factors:      Hypertension, hyperlipidemia, coronary artery disease. Other Factors:     TIA. Comparison Study:  No previous exam noted. Performing Technologist: Bobetta Lime BS, RVT  Examination Guidelines: A complete evaluation includes B-mode imaging, spectral Doppler, color Doppler, and power Doppler as needed of all accessible portions of each vessel. Bilateral testing is considered an integral part of a complete examination. Limited examinations for reoccurring indications may be performed as noted.  Right Carotid Findings: +----------+--------+--------+--------+------------------+--------+           PSV cm/sEDV cm/sStenosisPlaque DescriptionComments  +----------+--------+--------+--------+------------------+--------+ CCA Prox  85      14                                         +----------+--------+--------+--------+------------------+--------+ CCA Distal69      15                                         +----------+--------+--------+--------+------------------+--------+ ICA Prox  81      19              calcific                   +----------+--------+--------+--------+------------------+--------+ ICA Distal68      18                                tortuous +----------+--------+--------+--------+------------------+--------+ ECA       117     17                                         +----------+--------+--------+--------+------------------+--------+ +----------+--------+-------+----------------+-------------------+           PSV cm/sEDV cmsDescribe        Arm Pressure (mmHG) +----------+--------+-------+----------------+-------------------+ [id number]             Multiphasic, WNL                    +----------+--------+-------+----------------+-------------------+ +---------+--------+--+--------+-+---------+ VertebralPSV cm/s44EDV cm/s8Antegrade +---------+--------+--+--------+-+---------+  Left Carotid Findings: +----------+--------+--------+--------+------------------+--------+           PSV cm/sEDV cm/sStenosisPlaque DescriptionComments +----------+--------+--------+--------+------------------+--------+ CCA Prox  82      13                                         +----------+--------+--------+--------+------------------+--------+ CCA Distal76      14                                         +----------+--------+--------+--------+------------------+--------+  ICA Prox  64      17              calcific                   +----------+--------+--------+--------+------------------+--------+ ICA Distal63      14                                          +----------+--------+--------+--------+------------------+--------+ ECA       94      11              heterogenous               +----------+--------+--------+--------+------------------+--------+ +----------+--------+--------+----------------+-------------------+           PSV cm/sEDV cm/sDescribe        Arm Pressure (mmHG) +----------+--------+--------+----------------+-------------------+ Subclavian200             Multiphasic, WNL                    +----------+--------+--------+----------------+-------------------+ +---------+--------+--+--------+-+---------+ VertebralPSV cm/s39EDV cm/s9Antegrade +---------+--------+--+--------+-+---------+   Summary: Right Carotid: Velocities in the right ICA are consistent with a 1-39% stenosis. Left Carotid: Velocities in the left ICA are consistent with a 1-39% stenosis. Vertebrals:  Bilateral vertebral arteries demonstrate antegrade flow. Subclavians: Normal flow hemodynamics were seen in bilateral subclavian              arteries. *See table(s) above for measurements and observations.  Electronically signed by Deitra Mayo MD on 07/14/2021 at 4:24:56 PM.    Final     Assessment/Plan Bilateral leg edema EF 60% in 07/23 Lasix 20 mg qd for 3 days and then 20 mg 3/week With Potassium Repeat BMP in 1 week  Other issues Primary insomnia with Sleep APnea Not using her mask Ativan helping with anxiety at night Will do 0.5 mg of Ativan QHS PRN for 30 days Also Write for Nurses to help with her mask If not able to use it will need Follow up with Dr Maxwell Caul to reval   2. Depression, major, single episode, mild (HCC) Mood much better Taken off Zoloft due to confusion     3. Chronic right-sided low back pain with right-sided sciatica On Tramadol Prn S/p Injection in Jan Will schedule 25 mg Tramadol at night     4. Paroxysmal atrial fibrillation/flutter (HCC) Pn Eliquis and Amiodarone and Toprol     5. Essential  hypertension Cozaar and Toprol   6 Possible CVA  On Statin and Eliquis 7 Vit B 12 def B12 Level Less then 400 start her in B 12 1000 mcg QD   Family/ staff Communication:   Labs/tests ordered:

## 2021-08-13 DIAGNOSIS — R2689 Other abnormalities of gait and mobility: Secondary | ICD-10-CM | POA: Diagnosis not present

## 2021-08-13 DIAGNOSIS — G4733 Obstructive sleep apnea (adult) (pediatric): Secondary | ICD-10-CM | POA: Diagnosis not present

## 2021-08-13 DIAGNOSIS — I129 Hypertensive chronic kidney disease with stage 1 through stage 4 chronic kidney disease, or unspecified chronic kidney disease: Secondary | ICD-10-CM | POA: Diagnosis not present

## 2021-08-13 DIAGNOSIS — M6281 Muscle weakness (generalized): Secondary | ICD-10-CM | POA: Diagnosis not present

## 2021-08-13 DIAGNOSIS — R41841 Cognitive communication deficit: Secondary | ICD-10-CM | POA: Diagnosis not present

## 2021-08-13 DIAGNOSIS — R262 Difficulty in walking, not elsewhere classified: Secondary | ICD-10-CM | POA: Diagnosis not present

## 2021-08-13 DIAGNOSIS — R1312 Dysphagia, oropharyngeal phase: Secondary | ICD-10-CM | POA: Diagnosis not present

## 2021-08-14 DIAGNOSIS — R1312 Dysphagia, oropharyngeal phase: Secondary | ICD-10-CM | POA: Diagnosis not present

## 2021-08-14 DIAGNOSIS — I129 Hypertensive chronic kidney disease with stage 1 through stage 4 chronic kidney disease, or unspecified chronic kidney disease: Secondary | ICD-10-CM | POA: Diagnosis not present

## 2021-08-14 DIAGNOSIS — R41841 Cognitive communication deficit: Secondary | ICD-10-CM | POA: Diagnosis not present

## 2021-08-14 DIAGNOSIS — R2689 Other abnormalities of gait and mobility: Secondary | ICD-10-CM | POA: Diagnosis not present

## 2021-08-14 DIAGNOSIS — R262 Difficulty in walking, not elsewhere classified: Secondary | ICD-10-CM | POA: Diagnosis not present

## 2021-08-14 DIAGNOSIS — M6281 Muscle weakness (generalized): Secondary | ICD-10-CM | POA: Diagnosis not present

## 2021-08-16 DIAGNOSIS — R41841 Cognitive communication deficit: Secondary | ICD-10-CM | POA: Diagnosis not present

## 2021-08-16 DIAGNOSIS — R262 Difficulty in walking, not elsewhere classified: Secondary | ICD-10-CM | POA: Diagnosis not present

## 2021-08-16 DIAGNOSIS — R2689 Other abnormalities of gait and mobility: Secondary | ICD-10-CM | POA: Diagnosis not present

## 2021-08-16 DIAGNOSIS — M6281 Muscle weakness (generalized): Secondary | ICD-10-CM | POA: Diagnosis not present

## 2021-08-16 DIAGNOSIS — R1312 Dysphagia, oropharyngeal phase: Secondary | ICD-10-CM | POA: Diagnosis not present

## 2021-08-16 DIAGNOSIS — I129 Hypertensive chronic kidney disease with stage 1 through stage 4 chronic kidney disease, or unspecified chronic kidney disease: Secondary | ICD-10-CM | POA: Diagnosis not present

## 2021-08-18 DIAGNOSIS — R262 Difficulty in walking, not elsewhere classified: Secondary | ICD-10-CM | POA: Diagnosis not present

## 2021-08-18 DIAGNOSIS — I129 Hypertensive chronic kidney disease with stage 1 through stage 4 chronic kidney disease, or unspecified chronic kidney disease: Secondary | ICD-10-CM | POA: Diagnosis not present

## 2021-08-18 DIAGNOSIS — R41841 Cognitive communication deficit: Secondary | ICD-10-CM | POA: Diagnosis not present

## 2021-08-18 DIAGNOSIS — R1312 Dysphagia, oropharyngeal phase: Secondary | ICD-10-CM | POA: Diagnosis not present

## 2021-08-18 DIAGNOSIS — R2689 Other abnormalities of gait and mobility: Secondary | ICD-10-CM | POA: Diagnosis not present

## 2021-08-18 DIAGNOSIS — M6281 Muscle weakness (generalized): Secondary | ICD-10-CM | POA: Diagnosis not present

## 2021-08-19 DIAGNOSIS — M6281 Muscle weakness (generalized): Secondary | ICD-10-CM | POA: Diagnosis not present

## 2021-08-19 DIAGNOSIS — R262 Difficulty in walking, not elsewhere classified: Secondary | ICD-10-CM | POA: Diagnosis not present

## 2021-08-19 DIAGNOSIS — R41841 Cognitive communication deficit: Secondary | ICD-10-CM | POA: Diagnosis not present

## 2021-08-19 DIAGNOSIS — R2689 Other abnormalities of gait and mobility: Secondary | ICD-10-CM | POA: Diagnosis not present

## 2021-08-19 DIAGNOSIS — I129 Hypertensive chronic kidney disease with stage 1 through stage 4 chronic kidney disease, or unspecified chronic kidney disease: Secondary | ICD-10-CM | POA: Diagnosis not present

## 2021-08-19 DIAGNOSIS — R1312 Dysphagia, oropharyngeal phase: Secondary | ICD-10-CM | POA: Diagnosis not present

## 2021-08-20 DIAGNOSIS — R2689 Other abnormalities of gait and mobility: Secondary | ICD-10-CM | POA: Diagnosis not present

## 2021-08-20 DIAGNOSIS — I129 Hypertensive chronic kidney disease with stage 1 through stage 4 chronic kidney disease, or unspecified chronic kidney disease: Secondary | ICD-10-CM | POA: Diagnosis not present

## 2021-08-20 DIAGNOSIS — R41841 Cognitive communication deficit: Secondary | ICD-10-CM | POA: Diagnosis not present

## 2021-08-20 DIAGNOSIS — R262 Difficulty in walking, not elsewhere classified: Secondary | ICD-10-CM | POA: Diagnosis not present

## 2021-08-20 DIAGNOSIS — M6281 Muscle weakness (generalized): Secondary | ICD-10-CM | POA: Diagnosis not present

## 2021-08-20 DIAGNOSIS — R1312 Dysphagia, oropharyngeal phase: Secondary | ICD-10-CM | POA: Diagnosis not present

## 2021-08-22 ENCOUNTER — Non-Acute Institutional Stay (SKILLED_NURSING_FACILITY): Payer: Medicare Other | Admitting: Adult Health

## 2021-08-22 ENCOUNTER — Encounter: Payer: Self-pay | Admitting: Adult Health

## 2021-08-22 DIAGNOSIS — M5441 Lumbago with sciatica, right side: Secondary | ICD-10-CM | POA: Diagnosis not present

## 2021-08-22 DIAGNOSIS — R262 Difficulty in walking, not elsewhere classified: Secondary | ICD-10-CM | POA: Diagnosis not present

## 2021-08-22 DIAGNOSIS — I480 Paroxysmal atrial fibrillation: Secondary | ICD-10-CM | POA: Diagnosis not present

## 2021-08-22 DIAGNOSIS — M6281 Muscle weakness (generalized): Secondary | ICD-10-CM | POA: Diagnosis not present

## 2021-08-22 DIAGNOSIS — G8929 Other chronic pain: Secondary | ICD-10-CM

## 2021-08-22 DIAGNOSIS — I10 Essential (primary) hypertension: Secondary | ICD-10-CM

## 2021-08-22 DIAGNOSIS — R1312 Dysphagia, oropharyngeal phase: Secondary | ICD-10-CM | POA: Diagnosis not present

## 2021-08-22 DIAGNOSIS — I129 Hypertensive chronic kidney disease with stage 1 through stage 4 chronic kidney disease, or unspecified chronic kidney disease: Secondary | ICD-10-CM | POA: Diagnosis not present

## 2021-08-22 DIAGNOSIS — R2689 Other abnormalities of gait and mobility: Secondary | ICD-10-CM | POA: Diagnosis not present

## 2021-08-22 DIAGNOSIS — R41841 Cognitive communication deficit: Secondary | ICD-10-CM | POA: Diagnosis not present

## 2021-08-22 NOTE — Progress Notes (Unsigned)
Location:  [facility] Room Number: NO/34/A Place of Service:  SNF (31) Provider:  Durenda Age, DNP, FNP-BC  Patient Care Team: Virgie Dad, MD as PCP - General (Internal Medicine) Leonie Man, MD as PCP - Cardiology (Cardiology) Mast, [name], NP as Nurse Practitioner (Internal Medicine) Lavonna Monarch, MD as Consulting Physician (Dermatology)  Extended Emergency Contact Information Primary Emergency Contact: Hayes Mobile Phone: [phone] Relation: Daughter Secondary Emergency Contact: Weeks,Tiffany Mobile Phone: [phone] Relation: Daughter Preferred language: English Interpreter needed? No  Code Status:  DNR  Goals of care: Advanced Directive information    08/12/2021    2:59 PM  Advanced Directives  Does Patient Have a Medical Advance Directive? Yes  Type of Paramedic of [place];Out of facility DNR (pink MOST or yellow form);Living will  Does patient want to make changes to medical advance directive? No - Patient declined  Copy of [place] in Chart? Yes - validated most recent copy scanned in chart (See row information)  Pre-existing out of facility DNR order (yellow form or pink MOST form) Yellow form placed in chart (order not valid for inpatient use);Pink MOST form placed in chart (order not valid for inpatient use)     Chief Complaint  Patient presents with   Acute Visit    Patient is here to restart Eliquis    HPI:  Pt is a 88 y.o. female seen today for medical management of chronic diseases.  ***   Past Medical History:  Diagnosis Date   Atherosclerosis of native coronary artery 2001   LAD & Cx disease -- BMS PCI in 2001; CABG 2002.  PCI in 2006 (per-op TAH)   Atrial fibrillation and flutter ([place]) 04/2019   New diagnosis-major complaint was chest tightness and dyspnea.   Breast cancer ([place])    Cervix dysplasia    GERD (gastroesophageal reflux disease)     Heart attack ([place]) 2002, 2006   Both events reportedly were perioperatively. She has no recollection. Initial PCI was related to angina symptoms (bilateral arm pain)   High cholesterol    Hypertension    Lumbosacral spinal stenosis    Osteoarthritis    Skin cancer    Sleep apnea    Uses CPAP faithfully   Squamous cell carcinoma of skin 02/12/2020   in situ- left neck-anterior (CX35FU)   Past Surgical History:  Procedure Laterality Date   ABDOMINAL HYSTERECTOMY     bone density     CARDIAC CATHETERIZATION  2001    Dr. Glade Lloyd [facility]) 2 vessel CAD involving LAD-D1 and distal Cx-OM. Appearance of dilated tapered left main with no significant atherosclerosis on IVUS --  BMS PCI of pLAD ([facility] BMS 3.0 mm x 15 mm) & with PTCA of ostial D1, PTCA of dLAD.  BMS PCI pCx [facility] BMS 3.5 mm x 11 mm) - unable to reopen dCx-OM (probable distal Atheroembolic occlusion.  Normal LVEF.   COLONOSCOPY     CORONARY ARTERY BYPASS GRAFT  2002   Unsure of details [facility] in [facility])   [place]  2001   () [facility] BMS PCI pLAD (3.0 mm x 15 mm) -PTCA of jailed D1 as well as distal LAD; pCx ([facility] BMS 3.5 mm x 11 mm) -unable to open distalCx-OM -thromboembolic occlusion   DIAGNOSTIC MAMMOGRAM     LEFT HEART CATH AND CORS/GRAFTS ANGIOGRAPHY N/A 04/25/2019   Procedure: LEFT HEART CATH AND CORS/GRAFTS ANGIOGRAPHY;  Surgeon: Troy Sine,  MD;  Location: [facility] INVASIVE CV LAB;;; ost-prox LCx stent @ OM1 ostium =100% CTO, Ost-prox LAD stent ~30% ISR, Ost D1 CTO, prox RCA ~30%. SCG-OM2 patent with ostial stent 30%, LIMA-LAD patent; CTO of SVG-D1.    left knee replacement     MASTECTOMY     NM MYOVIEW LTD  12/2014; 2018   Cardiologist (Dr. Kellie Simmering.  [place], [place] ph# [phone]) -->LEXISCAN: Nonischemic, "normal "   pap smear     tonsilectomy     TRANSTHORACIC ECHOCARDIOGRAM  12/2014   Echocardiogram 12/25/14- normal LVEF and systolic function, EF 46-96%, mild grade 1  diastolic dysfunction   TRANSTHORACIC ECHOCARDIOGRAM  04/24/2019   EF 65 to 70%.  Moderate septal LVH.  GR 1 DD-elevated EDP.  Normal PAP.  Relatively normal valves.  No aortic stenosis.    Allergies  Allergen Reactions   Codeine Nausea Only    Outpatient Encounter Medications as of 08/22/2021  Medication Sig   acetaminophen (TYLENOL) 500 MG tablet Take 2 tablets (1,000 mg total) by mouth 2 (two) times daily.   amiodarone (PACERONE) 100 MG tablet Take 100 mg by mouth daily.   apixaban (ELIQUIS) 5 MG TABS tablet Take 1 tablet (5 mg total) by mouth 2 (two) times daily.   atorvastatin (LIPITOR) 80 MG tablet Take 1 tablet (80 mg total) by mouth daily.   Cholecalciferol (VITAMIN D3) 50 MCG (2000 UT) TABS Take 2,000 Units by mouth daily after breakfast.   docusate sodium (COLACE) 100 MG capsule Take 1 capsule (100 mg total) by mouth daily.   fexofenadine (ALLEGRA) 180 MG tablet Take 1 tablet (180 mg total) by mouth daily as needed for allergies or rhinitis.   fluticasone (FLONASE) 50 MCG/ACT nasal spray Place 1 spray into both nostrils daily as needed for allergies or rhinitis.   furosemide (LASIX) 20 MG tablet Take 20 mg by mouth 3 (three) times a week.   lidocaine (SALONPAS PAIN RELIEVING) 4 % Place 1 patch onto the skin daily.   LORazepam (ATIVAN) 0.5 MG tablet Take 0.5 mg by mouth at bedtime as needed for anxiety.   losartan (COZAAR) 25 MG tablet Take 25 mg by mouth daily.   melatonin 5 MG TABS Take 5 mg by mouth at bedtime.   methocarbamol (ROBAXIN) 500 MG tablet Take 250 mg by mouth as needed.   metoprolol succinate (TOPROL-XL) 50 MG 24 hr tablet Take 1 tablet (50 mg total) by mouth daily. Take with or immediately following a meal.   nitroGLYCERIN (NITROSTAT) 0.4 MG SL tablet Place 0.4 mg under the tongue as needed for chest pain (Every 5 minutes x 3 doses).   nystatin (MYCOSTATIN/NYSTOP) powder Apply 1 Application topically 2 (two) times daily.   omeprazole (PRILOSEC OTC) 20 MG tablet  Take 1 tablet (20 mg total) by mouth daily as needed.   polyethylene glycol (MIRALAX / GLYCOLAX) 17 g packet Take 17 g by mouth daily. Give with 4 oz of water.   potassium chloride SA (KLOR-CON M) 20 MEQ tablet Take 20 mEq by mouth 3 (three) times a week.   senna-docusate (SENOKOT-S) 8.6-50 MG tablet Take 2 tablets by mouth at bedtime.   traMADol (ULTRAM) 50 MG tablet Take 1 tablet (50 mg total) by mouth every 6 (six) hours as needed.   traMADol (ULTRAM) 50 MG tablet Take 0.5 tablets (25 mg total) by mouth at bedtime.   vitamin B-12 (CYANOCOBALAMIN) 1000 MCG tablet Take 1,000 mcg by mouth daily.   zinc oxide 20 % ointment Apply 1  application topically 3 (three) times daily as needed for irritation. Apply to buttocks after every incontinent episode and as needed for redness.   No facility-administered encounter medications on file as of 08/22/2021.    Review of Systems ***    Immunization History  Administered Date(s) Administered   Fluad Quad(high Dose 65+) 10/28/2020   Influenza, High Dose Seasonal PF 10/21/2016, 09/24/2017, 10/25/2019   Influenza-Unspecified 10/03/2011, 10/04/2012, 01/13/2016   Moderna SARS-COV2 Booster Vaccination 11/21/2019, 06/11/2020   Moderna Sars-Covid-2 Vaccination 01/16/2019, 02/13/2019, 03/13/2019   Pneumococcal-Unspecified 01/13/2016   Tetanus 12/01/2017   Zoster Recombinat (Shingrix) 08/24/2018, 02/22/2020   Pertinent  Health Maintenance Due  Topic Date Due   HEMOGLOBIN A1C  12/01/2019   OPHTHALMOLOGY EXAM  05/30/2021   INFLUENZA VACCINE  08/12/2021   FOOT EXAM  10/17/2021   DEXA SCAN  Completed      05/25/2019    1:28 PM 09/12/2020    2:57 PM 10/17/2020    2:53 PM 02/07/2021   12:47 PM 06/19/2021   12:11 AM  Fall Risk  Falls in the past year? 0 0 0 1   Was there an injury with Fall?  0 0 0   Fall Risk Category Calculator  0 0 1   Fall Risk Category  Low Low Low   Patient Fall Risk Level  Low fall risk Low fall risk Moderate fall risk Moderate fall  risk  Patient at Risk for Falls Due to  No Fall Risks No Fall Risks History of fall(s);Impaired balance/gait;Impaired mobility;Orthopedic patient   Patient at Risk for Falls Due to - Comments    spinal stenosis of lumbar   Fall risk Follow up  Falls evaluation completed Falls evaluation completed Falls evaluation completed;Education provided;Falls prevention discussed      There were no vitals filed for this visit. There is no height or weight on file to calculate BMI.  Physical Exam     Labs reviewed: Recent Labs    03/26/21 0000 04/30/21 0000 06/19/21 0006 06/19/21 0010  NA 140 140 140 141  K 4.4 4.2 4.1 4.0  CL 106 103 104 103  CO2 27* 26* 28  --   GLUCOSE  --   --  108* 106*  BUN '21 16 14 17  '$ CREATININE 0.9 0.9 0.85 0.80  CALCIUM 9.2 9.5 9.8  --    Recent Labs    12/02/20 0000 03/26/21 0000  AST 17 15  ALT 15 12  ALKPHOS 43 47  ALBUMIN 3.6 3.8   Recent Labs    12/02/20 0000 03/26/21 0000 05/01/21 0000 06/19/21 0006 06/19/21 0010  WBC 6.0 6.5 6.9 7.2  --   NEUTROABS 10.70 3,627.00  --  3.5  --   HGB 12.4 11.9* 11.7* 13.2 13.9  HCT 37 35* 35* 41.8 41.0  MCV  --   --   --  105.0*  --   PLT 248 226 221 216  --    Lab Results  Component Value Date   TSH 1.390 07/03/2021   Lab Results  Component Value Date   HGBA1C 5.9 (H) 05/31/2019   Lab Results  Component Value Date   CHOL 169 04/05/2020   HDL 70 04/05/2020   LDLCALC 74 04/05/2020   TRIG 150 (H) 04/05/2020   CHOLHDL 2.4 04/05/2020    Significant Diagnostic Results in last 30 days:  MR BRAIN WO CONTRAST  Result Date: 08/01/2021 CLINICAL DATA:  Provided history: Gait abnormality. Right-sided weakness. Neuro deficit, acute, stroke suspected. EXAM: MRI HEAD  WITHOUT CONTRAST TECHNIQUE: Multiplanar, multiecho pulse sequences of the brain and surrounding structures were obtained without intravenous contrast. COMPARISON:  Head CT 06/19/2021. FINDINGS: Mild intermittent motion degradation. Brain:  Mild-to-moderate generalized cerebral atrophy. Moderate to advanced multifocal T2 FLAIR hyperintense signal abnormality within the cerebral white matter, nonspecific but compatible chronic small vessel disease. This includes multiple small chronic lacunar infarcts within the bilateral cerebral hemispheric white matter. Subcentimeter chronic infarct within the right cerebellar hemisphere. There is no acute infarct. No evidence of an intracranial mass. No chronic intracranial blood products. No extra-axial fluid collection. No midline shift. Vascular: Maintained flow voids within the proximal large arterial vessels. Skull and upper cervical spine: No focal suspicious marrow lesion. Sinuses/Orbits: No mass or acute finding within the imaged orbits. Prior bilateral ocular lens replacement. Mild mucosal thickening within the bilateral ethmoid sinuses. IMPRESSION: 1. Mildly motion degraded exam. 2. Moderate to advanced chronic small vessel ischemic changes within the cerebral white matter. 3. Subcentimeter chronic infarct within the right cerebellar hemisphere. 4. Mild-to-moderate generalized cerebral atrophy. 5. Mild bilateral ethmoid sinus mucosal thickening. Electronically Signed   By: Kellie Simmering D.O.   On: 08/01/2021 12:41   ECHOCARDIOGRAM COMPLETE  Result Date: 07/25/2021    ECHOCARDIOGRAM REPORT   Patient Name:   Stephanie Shea Date of Exam: 07/25/2021 Medical Rec #:  [id number]     Height:       67.0 in Accession #:    [id number]    Weight:       186.0 lb Date of Birth:  [birth year]    BSA:          1.961 m Patient Age:    19 years      BP:           118/64 mmHg Patient Gender: F             HR:           66 bpm. Exam Location:  [address] Procedure: 2D Echo, Color Doppler, Cardiac Doppler and 3D Echo Indications:    Stroke I63.9  History:        Patient has prior history of Echocardiogram examinations, most                 recent 04/24/2019. Prior CABG; Arrythmias:Atrial Fibrillation.  Sonographer:    Mikki Santee RDCS Referring Phys: [place]  1. Left ventricular ejection fraction, by estimation, is 60 to 65%. The left ventricle has normal function. The left ventricle has no regional wall motion abnormalities. There is moderate asymmetric left ventricular hypertrophy of the basal-septal segment. Left ventricular diastolic parameters are indeterminate.  2. Right ventricular systolic function was not well visualized. The right ventricular size is not well visualized. Tricuspid regurgitation signal is inadequate for assessing PA pressure.  3. The mitral valve is normal in structure. No evidence of mitral valve regurgitation. No evidence of mitral stenosis.  4. The aortic valve was not well visualized. Aortic valve regurgitation is not visualized. Aortic valve sclerosis/calcification is present, without any evidence of aortic stenosis.  5. The inferior vena cava is normal in size with greater than 50% respiratory variability, suggesting right atrial pressure of 3 mmHg. FINDINGS  Left Ventricle: Left ventricular ejection fraction, by estimation, is 60 to 65%. The left ventricle has normal function. The left ventricle has no regional wall motion abnormalities. The left ventricular internal cavity size was small. There is moderate  asymmetric left ventricular hypertrophy of the basal-septal segment. Left ventricular diastolic parameters  are indeterminate. Right Ventricle: The right ventricular size is not well visualized. Right vetricular wall thickness was not well visualized. Right ventricular systolic function was not well visualized. Tricuspid regurgitation signal is inadequate for assessing PA pressure. Left Atrium: Left atrial size was normal in size. Right Atrium: Right atrial size was normal in size. Pericardium: There is no evidence of pericardial effusion. Presence of epicardial fat layer. Mitral Valve: The mitral valve is normal in structure. No evidence of mitral valve regurgitation. No  evidence of mitral valve stenosis. Tricuspid Valve: The tricuspid valve is normal in structure. Tricuspid valve regurgitation is trivial. Aortic Valve: The aortic valve was not well visualized. Aortic valve regurgitation is not visualized. Aortic valve sclerosis/calcification is present, without any evidence of aortic stenosis. Pulmonic Valve: The pulmonic valve was not well visualized. Pulmonic valve regurgitation is not visualized. Aorta: The aortic root is normal in size and structure. Venous: The inferior vena cava is normal in size with greater than 50% respiratory variability, suggesting right atrial pressure of 3 mmHg. IAS/Shunts: The interatrial septum was not well visualized.  LEFT VENTRICLE PLAX 2D LVIDd:         3.70 cm   Diastology LVIDs:         3.10 cm   LV e' medial:    4.30 cm/s LV PW:         1.00 cm   LV E/e' medial:  15.6 LV IVS:        1.30 cm   LV e' lateral:   4.38 cm/s LVOT diam:     2.00 cm   LV E/e' lateral: 15.3 LV SV:         65 LV SV Index:   33 LVOT Area:     3.14 cm                           3D Volume EF:                          3D EF:        59 %                          LV EDV:       117 ml                          LV ESV:       48 ml                          LV SV:        69 ml RIGHT VENTRICLE RV Basal diam:  2.90 cm RV S prime:     9.65 cm/s TAPSE (M-mode): 1.2 cm LEFT ATRIUM             Index        RIGHT ATRIUM           Index LA diam:        3.50 cm 1.78 cm/m   RA Area:     13.90 cm LA Vol (A2C):   35.8 ml 18.26 ml/m  RA Volume:   31.20 ml  15.91 ml/m LA Vol (A4C):   41.6 ml 21.22 ml/m LA Biplane Vol: 40.1 ml 20.45 ml/m  AORTIC VALVE LVOT Vmax:   95.40 cm/s LVOT Vmean:  63.400 cm/s LVOT VTI:    0.207 m  AORTA Ao Root diam: 2.80 cm MITRAL VALVE MV Area (PHT): 2.50 cm     SHUNTS MV Decel Time: 303 msec     Systemic VTI:  0.21 m MV E velocity: 67.10 cm/s   Systemic Diam: 2.00 cm MV A velocity: 106.00 cm/s MV E/A ratio:  0.63 Oswaldo Milian MD Electronically signed by  Oswaldo Milian MD Signature Date/Time: 07/25/2021/5:11:20 PM    Final     Assessment/Plan ***   Family/ staff Communication: Discussed plan of care with resident and charge nurse  Labs/tests ordered:     Durenda Age, DNP, MSN, FNP-BC [facility] [phone] (Monday-Friday 8:00 a.m. - 5:00 p.m.) [phone] (after hours)

## 2021-08-25 DIAGNOSIS — M6281 Muscle weakness (generalized): Secondary | ICD-10-CM | POA: Diagnosis not present

## 2021-08-25 DIAGNOSIS — R41841 Cognitive communication deficit: Secondary | ICD-10-CM | POA: Diagnosis not present

## 2021-08-25 DIAGNOSIS — R1312 Dysphagia, oropharyngeal phase: Secondary | ICD-10-CM | POA: Diagnosis not present

## 2021-08-25 DIAGNOSIS — R262 Difficulty in walking, not elsewhere classified: Secondary | ICD-10-CM | POA: Diagnosis not present

## 2021-08-25 DIAGNOSIS — R2689 Other abnormalities of gait and mobility: Secondary | ICD-10-CM | POA: Diagnosis not present

## 2021-08-25 DIAGNOSIS — I129 Hypertensive chronic kidney disease with stage 1 through stage 4 chronic kidney disease, or unspecified chronic kidney disease: Secondary | ICD-10-CM | POA: Diagnosis not present

## 2021-08-26 DIAGNOSIS — R262 Difficulty in walking, not elsewhere classified: Secondary | ICD-10-CM | POA: Diagnosis not present

## 2021-08-26 DIAGNOSIS — R1312 Dysphagia, oropharyngeal phase: Secondary | ICD-10-CM | POA: Diagnosis not present

## 2021-08-26 DIAGNOSIS — I129 Hypertensive chronic kidney disease with stage 1 through stage 4 chronic kidney disease, or unspecified chronic kidney disease: Secondary | ICD-10-CM | POA: Diagnosis not present

## 2021-08-26 DIAGNOSIS — R41841 Cognitive communication deficit: Secondary | ICD-10-CM | POA: Diagnosis not present

## 2021-08-26 DIAGNOSIS — M6281 Muscle weakness (generalized): Secondary | ICD-10-CM | POA: Diagnosis not present

## 2021-08-26 DIAGNOSIS — R2689 Other abnormalities of gait and mobility: Secondary | ICD-10-CM | POA: Diagnosis not present

## 2021-08-26 LAB — BASIC METABOLIC PANEL
BUN: 17 (ref 4–21)
CO2: 29 — AB (ref 13–22)
Chloride: 103 (ref 99–108)
Creatinine: 0.9 (ref 0.5–1.1)
Glucose: 109
Potassium: 4.1 mEq/L (ref 3.5–5.1)
Sodium: 141 (ref 137–147)

## 2021-08-26 LAB — COMPREHENSIVE METABOLIC PANEL
Calcium: 10 (ref 8.7–10.7)
eGFR: 62

## 2021-08-27 DIAGNOSIS — M6281 Muscle weakness (generalized): Secondary | ICD-10-CM | POA: Diagnosis not present

## 2021-08-27 DIAGNOSIS — R262 Difficulty in walking, not elsewhere classified: Secondary | ICD-10-CM | POA: Diagnosis not present

## 2021-08-27 DIAGNOSIS — R2689 Other abnormalities of gait and mobility: Secondary | ICD-10-CM | POA: Diagnosis not present

## 2021-08-27 DIAGNOSIS — I129 Hypertensive chronic kidney disease with stage 1 through stage 4 chronic kidney disease, or unspecified chronic kidney disease: Secondary | ICD-10-CM | POA: Diagnosis not present

## 2021-08-27 DIAGNOSIS — R1312 Dysphagia, oropharyngeal phase: Secondary | ICD-10-CM | POA: Diagnosis not present

## 2021-08-27 DIAGNOSIS — R41841 Cognitive communication deficit: Secondary | ICD-10-CM | POA: Diagnosis not present

## 2021-08-28 DIAGNOSIS — I129 Hypertensive chronic kidney disease with stage 1 through stage 4 chronic kidney disease, or unspecified chronic kidney disease: Secondary | ICD-10-CM | POA: Diagnosis not present

## 2021-08-28 DIAGNOSIS — R1312 Dysphagia, oropharyngeal phase: Secondary | ICD-10-CM | POA: Diagnosis not present

## 2021-08-28 DIAGNOSIS — M6281 Muscle weakness (generalized): Secondary | ICD-10-CM | POA: Diagnosis not present

## 2021-08-28 DIAGNOSIS — R41841 Cognitive communication deficit: Secondary | ICD-10-CM | POA: Diagnosis not present

## 2021-08-28 DIAGNOSIS — R2689 Other abnormalities of gait and mobility: Secondary | ICD-10-CM | POA: Diagnosis not present

## 2021-08-28 DIAGNOSIS — R262 Difficulty in walking, not elsewhere classified: Secondary | ICD-10-CM | POA: Diagnosis not present

## 2021-09-01 ENCOUNTER — Encounter: Payer: Self-pay | Admitting: Internal Medicine

## 2021-09-01 DIAGNOSIS — R1312 Dysphagia, oropharyngeal phase: Secondary | ICD-10-CM | POA: Diagnosis not present

## 2021-09-01 DIAGNOSIS — R262 Difficulty in walking, not elsewhere classified: Secondary | ICD-10-CM | POA: Diagnosis not present

## 2021-09-01 DIAGNOSIS — M6281 Muscle weakness (generalized): Secondary | ICD-10-CM | POA: Diagnosis not present

## 2021-09-01 DIAGNOSIS — R41841 Cognitive communication deficit: Secondary | ICD-10-CM | POA: Diagnosis not present

## 2021-09-01 DIAGNOSIS — I129 Hypertensive chronic kidney disease with stage 1 through stage 4 chronic kidney disease, or unspecified chronic kidney disease: Secondary | ICD-10-CM | POA: Diagnosis not present

## 2021-09-01 DIAGNOSIS — R2689 Other abnormalities of gait and mobility: Secondary | ICD-10-CM | POA: Diagnosis not present

## 2021-09-02 DIAGNOSIS — R1312 Dysphagia, oropharyngeal phase: Secondary | ICD-10-CM | POA: Diagnosis not present

## 2021-09-02 DIAGNOSIS — R41841 Cognitive communication deficit: Secondary | ICD-10-CM | POA: Diagnosis not present

## 2021-09-02 DIAGNOSIS — M6281 Muscle weakness (generalized): Secondary | ICD-10-CM | POA: Diagnosis not present

## 2021-09-02 DIAGNOSIS — R262 Difficulty in walking, not elsewhere classified: Secondary | ICD-10-CM | POA: Diagnosis not present

## 2021-09-02 DIAGNOSIS — R2689 Other abnormalities of gait and mobility: Secondary | ICD-10-CM | POA: Diagnosis not present

## 2021-09-02 DIAGNOSIS — I129 Hypertensive chronic kidney disease with stage 1 through stage 4 chronic kidney disease, or unspecified chronic kidney disease: Secondary | ICD-10-CM | POA: Diagnosis not present

## 2021-09-03 DIAGNOSIS — M6281 Muscle weakness (generalized): Secondary | ICD-10-CM | POA: Diagnosis not present

## 2021-09-03 DIAGNOSIS — M25511 Pain in right shoulder: Secondary | ICD-10-CM | POA: Diagnosis not present

## 2021-09-03 DIAGNOSIS — R2689 Other abnormalities of gait and mobility: Secondary | ICD-10-CM | POA: Diagnosis not present

## 2021-09-03 DIAGNOSIS — I129 Hypertensive chronic kidney disease with stage 1 through stage 4 chronic kidney disease, or unspecified chronic kidney disease: Secondary | ICD-10-CM | POA: Diagnosis not present

## 2021-09-03 DIAGNOSIS — R1312 Dysphagia, oropharyngeal phase: Secondary | ICD-10-CM | POA: Diagnosis not present

## 2021-09-03 DIAGNOSIS — R262 Difficulty in walking, not elsewhere classified: Secondary | ICD-10-CM | POA: Diagnosis not present

## 2021-09-03 DIAGNOSIS — R41841 Cognitive communication deficit: Secondary | ICD-10-CM | POA: Diagnosis not present

## 2021-09-04 DIAGNOSIS — R262 Difficulty in walking, not elsewhere classified: Secondary | ICD-10-CM | POA: Diagnosis not present

## 2021-09-04 DIAGNOSIS — R41841 Cognitive communication deficit: Secondary | ICD-10-CM | POA: Diagnosis not present

## 2021-09-04 DIAGNOSIS — M6281 Muscle weakness (generalized): Secondary | ICD-10-CM | POA: Diagnosis not present

## 2021-09-04 DIAGNOSIS — R2689 Other abnormalities of gait and mobility: Secondary | ICD-10-CM | POA: Diagnosis not present

## 2021-09-04 DIAGNOSIS — R1312 Dysphagia, oropharyngeal phase: Secondary | ICD-10-CM | POA: Diagnosis not present

## 2021-09-04 DIAGNOSIS — I129 Hypertensive chronic kidney disease with stage 1 through stage 4 chronic kidney disease, or unspecified chronic kidney disease: Secondary | ICD-10-CM | POA: Diagnosis not present

## 2021-09-07 DIAGNOSIS — I129 Hypertensive chronic kidney disease with stage 1 through stage 4 chronic kidney disease, or unspecified chronic kidney disease: Secondary | ICD-10-CM | POA: Diagnosis not present

## 2021-09-07 DIAGNOSIS — R2689 Other abnormalities of gait and mobility: Secondary | ICD-10-CM | POA: Diagnosis not present

## 2021-09-07 DIAGNOSIS — M6281 Muscle weakness (generalized): Secondary | ICD-10-CM | POA: Diagnosis not present

## 2021-09-07 DIAGNOSIS — R262 Difficulty in walking, not elsewhere classified: Secondary | ICD-10-CM | POA: Diagnosis not present

## 2021-09-07 DIAGNOSIS — R41841 Cognitive communication deficit: Secondary | ICD-10-CM | POA: Diagnosis not present

## 2021-09-07 DIAGNOSIS — R1312 Dysphagia, oropharyngeal phase: Secondary | ICD-10-CM | POA: Diagnosis not present

## 2021-09-09 DIAGNOSIS — R1312 Dysphagia, oropharyngeal phase: Secondary | ICD-10-CM | POA: Diagnosis not present

## 2021-09-09 DIAGNOSIS — R262 Difficulty in walking, not elsewhere classified: Secondary | ICD-10-CM | POA: Diagnosis not present

## 2021-09-09 DIAGNOSIS — M6281 Muscle weakness (generalized): Secondary | ICD-10-CM | POA: Diagnosis not present

## 2021-09-09 DIAGNOSIS — R41841 Cognitive communication deficit: Secondary | ICD-10-CM | POA: Diagnosis not present

## 2021-09-09 DIAGNOSIS — I129 Hypertensive chronic kidney disease with stage 1 through stage 4 chronic kidney disease, or unspecified chronic kidney disease: Secondary | ICD-10-CM | POA: Diagnosis not present

## 2021-09-09 DIAGNOSIS — R2689 Other abnormalities of gait and mobility: Secondary | ICD-10-CM | POA: Diagnosis not present

## 2021-09-10 DIAGNOSIS — R2689 Other abnormalities of gait and mobility: Secondary | ICD-10-CM | POA: Diagnosis not present

## 2021-09-10 DIAGNOSIS — I129 Hypertensive chronic kidney disease with stage 1 through stage 4 chronic kidney disease, or unspecified chronic kidney disease: Secondary | ICD-10-CM | POA: Diagnosis not present

## 2021-09-10 DIAGNOSIS — R41841 Cognitive communication deficit: Secondary | ICD-10-CM | POA: Diagnosis not present

## 2021-09-10 DIAGNOSIS — M6281 Muscle weakness (generalized): Secondary | ICD-10-CM | POA: Diagnosis not present

## 2021-09-10 DIAGNOSIS — R262 Difficulty in walking, not elsewhere classified: Secondary | ICD-10-CM | POA: Diagnosis not present

## 2021-09-10 DIAGNOSIS — R1312 Dysphagia, oropharyngeal phase: Secondary | ICD-10-CM | POA: Diagnosis not present

## 2021-09-11 ENCOUNTER — Non-Acute Institutional Stay (SKILLED_NURSING_FACILITY): Payer: Medicare Other | Admitting: Nurse Practitioner

## 2021-09-11 ENCOUNTER — Encounter: Payer: Self-pay | Admitting: Nurse Practitioner

## 2021-09-11 DIAGNOSIS — R413 Other amnesia: Secondary | ICD-10-CM

## 2021-09-11 DIAGNOSIS — R262 Difficulty in walking, not elsewhere classified: Secondary | ICD-10-CM | POA: Diagnosis not present

## 2021-09-11 DIAGNOSIS — K5901 Slow transit constipation: Secondary | ICD-10-CM | POA: Diagnosis not present

## 2021-09-11 DIAGNOSIS — K219 Gastro-esophageal reflux disease without esophagitis: Secondary | ICD-10-CM | POA: Diagnosis not present

## 2021-09-11 DIAGNOSIS — F5105 Insomnia due to other mental disorder: Secondary | ICD-10-CM

## 2021-09-11 DIAGNOSIS — I25709 Atherosclerosis of coronary artery bypass graft(s), unspecified, with unspecified angina pectoris: Secondary | ICD-10-CM

## 2021-09-11 DIAGNOSIS — H5310 Unspecified subjective visual disturbances: Secondary | ICD-10-CM | POA: Diagnosis not present

## 2021-09-11 DIAGNOSIS — I480 Paroxysmal atrial fibrillation: Secondary | ICD-10-CM

## 2021-09-11 DIAGNOSIS — G453 Amaurosis fugax: Secondary | ICD-10-CM | POA: Diagnosis not present

## 2021-09-11 DIAGNOSIS — G459 Transient cerebral ischemic attack, unspecified: Secondary | ICD-10-CM

## 2021-09-11 DIAGNOSIS — G4733 Obstructive sleep apnea (adult) (pediatric): Secondary | ICD-10-CM

## 2021-09-11 DIAGNOSIS — N183 Chronic kidney disease, stage 3 unspecified: Secondary | ICD-10-CM | POA: Diagnosis not present

## 2021-09-11 DIAGNOSIS — F418 Other specified anxiety disorders: Secondary | ICD-10-CM

## 2021-09-11 DIAGNOSIS — R1312 Dysphagia, oropharyngeal phase: Secondary | ICD-10-CM | POA: Diagnosis not present

## 2021-09-11 DIAGNOSIS — R609 Edema, unspecified: Secondary | ICD-10-CM

## 2021-09-11 DIAGNOSIS — I129 Hypertensive chronic kidney disease with stage 1 through stage 4 chronic kidney disease, or unspecified chronic kidney disease: Secondary | ICD-10-CM | POA: Diagnosis not present

## 2021-09-11 DIAGNOSIS — R41841 Cognitive communication deficit: Secondary | ICD-10-CM | POA: Diagnosis not present

## 2021-09-11 DIAGNOSIS — M159 Polyosteoarthritis, unspecified: Secondary | ICD-10-CM

## 2021-09-11 DIAGNOSIS — Z9989 Dependence on other enabling machines and devices: Secondary | ICD-10-CM

## 2021-09-11 DIAGNOSIS — I10 Essential (primary) hypertension: Secondary | ICD-10-CM

## 2021-09-11 DIAGNOSIS — H40013 Open angle with borderline findings, low risk, bilateral: Secondary | ICD-10-CM | POA: Diagnosis not present

## 2021-09-11 DIAGNOSIS — M6281 Muscle weakness (generalized): Secondary | ICD-10-CM | POA: Diagnosis not present

## 2021-09-11 DIAGNOSIS — R2689 Other abnormalities of gait and mobility: Secondary | ICD-10-CM | POA: Diagnosis not present

## 2021-09-11 NOTE — Assessment & Plan Note (Signed)
stable, Hx of CABG Cath 04/25/19, prn NTG, takes Atorvastatin. LDL 74 3/25/2

## 2021-09-11 NOTE — Progress Notes (Signed)
Location:  [facility] Room Number: NO/34/A Place of Service:  SNF (31) Provider:  Shamra Bradeen X, NP Virgie Dad, MD  Patient Care Team: Virgie Dad, MD as PCP - General (Internal Medicine) Leonie Charnele Semple, MD as PCP - Cardiology (Cardiology) Lagretta Loseke X, NP as Nurse Practitioner (Internal Medicine) Lavonna Monarch, MD as Consulting Physician (Dermatology)  Extended Emergency Contact Information Primary Emergency Contact: Sanders Mobile Phone: [phone] Relation: Daughter Secondary Emergency Contact: Weeks,Tiffany Mobile Phone: [phone] Relation: Daughter Preferred language: English Interpreter needed? No  Code Status:  DNR Goals of care: Advanced Directive information    09/11/2021    3:39 PM  Advanced Directives  Does Patient Have a Medical Advance Directive? Yes  Type of Paramedic of [place];Out of facility DNR (pink MOST or yellow form);Living will  Does patient want to make changes to medical advance directive? No - Patient declined  Copy of [place] in Chart? Yes - validated most recent copy scanned in chart (See row information)  Pre-existing out of facility DNR order (yellow form or pink MOST form) Yellow form placed in chart (order not valid for inpatient use);Pink MOST form placed in chart (order not valid for inpatient use)     Chief Complaint  Patient presents with   Medical Management of Chronic Issues    Patient is here for a follow up for chronic conditions     HPI:  Pt is a 88 y.o. female seen today for managing chronic medical conditions.   TIA, stable, on Eliquis, Atorvastatin.              The left hip pain, travels down to the left knee, worsens at night, improved, 04/07/21. X-ray left hip/pelvis r/o acute process. OA, left knee s/p TKR, MR lumbar, severe spinal 11/13/20 spinal and foraminal stenosis with left sciatica, taking Tramadol, Tylenol, Methocarbamol.               Constipation, stable, on Senna, MiraLax.              Afib, takes Metoprolol, Eliquis, Amiodarone, f/u cardiology. TSH 1.39 07/03/21             HTN, takes Losartan, Metoprolol.              GERD, stable, takes Omeprazole, Hgb 13.9 06/19/21             CAD, stable, Hx of CABG Cath 04/25/19, prn NTG, takes Atorvastatin. LDL 74 3/25/2             CKD Bun/creat 17/0.80 06/19/21             Insomnia/OSA, on CPAP, prn Lorazepam  Depression, stable, off Sertraline 2/2 confusion.              Cognitive impairment, MMSE 27/30, SNF for supportive care. Vit B12 324 07/03/21, on Vit B12 1095mg qd.              BLE edema, mild, EF 60% 08/03/21, Furosemide 3x/wk       Past Medical History:  Diagnosis Date   Atherosclerosis of native coronary artery 2001   LAD & Cx disease -- BMS PCI in 2001; CABG 2002.  PCI in 2006 (per-op [facility])   [place]   New diagnosis-major complaint was chest tightness and dyspnea.   [place]    Cervix dysplasia    GERD (gastroesophageal reflux disease)    Heart attack (  [place]) 2002, 2006   Both events reportedly were perioperatively. She has no recollection. Initial PCI was related to angina symptoms (bilateral arm pain)   High cholesterol    Hypertension    Lumbosacral spinal stenosis    Osteoarthritis    Skin cancer    Sleep apnea    Uses CPAP faithfully   Squamous cell carcinoma of skin 02/12/2020   in situ- left neck-anterior (CX35FU)   Past Surgical History:  Procedure Laterality Date   ABDOMINAL HYSTERECTOMY     bone density     CARDIAC CATHETERIZATION  2001    Dr. Glade Lloyd [facility]) 2 vessel CAD involving LAD-D1 and distal Cx-OM. Appearance of dilated tapered left main with no significant atherosclerosis on IVUS --  BMS PCI of pLAD ([facility] BMS 3.0 mm x 15 mm) & with PTCA of ostial D1, PTCA of dLAD.  BMS PCI pCx [facility] BMS 3.5 mm x 11 mm) - unable to reopen dCx-OM (probable distal Atheroembolic occlusion.   Normal LVEF.   COLONOSCOPY     CORONARY ARTERY BYPASS GRAFT  2002   Unsure of details [facility] in [facility])   [place]  2001   ([place]) [place] BMS PCI pLAD (3.0 mm x 15 mm) -PTCA of jailed D1 as well as distal LAD; pCx ([facility]ale BMS 3.5 mm x 11 mm) -unable to open distalCx-OM -thromboembolic occlusion   DIAGNOSTIC MAMMOGRAM     LEFT HEART CATH AND CORS/GRAFTS ANGIOGRAPHY N/A 04/25/2019   Procedure: LEFT HEART CATH AND CORS/GRAFTS ANGIOGRAPHY;  Surgeon: Troy Sine, MD;  Location: [facility] INVASIVE CV LAB;;; ost-prox LCx stent @ OM1 ostium =100% CTO, Ost-prox LAD stent ~30% ISR, Ost D1 CTO, prox RCA ~30%. SCG-OM2 patent with ostial stent 30%, LIMA-LAD patent; CTO of SVG-D1.    left knee replacement     MASTECTOMY     NM MYOVIEW LTD  12/2014; 2018   Cardiologist (Dr. Kellie Simmering.  [place], [place] ph# [phone]) -->LEXISCAN: Nonischemic, "normal "   pap smear     tonsilectomy     TRANSTHORACIC ECHOCARDIOGRAM  12/2014   Echocardiogram 12/25/14- normal LVEF and systolic function, EF 90-24%, mild grade 1 diastolic dysfunction   TRANSTHORACIC ECHOCARDIOGRAM  04/24/2019   EF 65 to 70%.  Moderate septal LVH.  GR 1 DD-elevated EDP.  Normal PAP.  Relatively normal valves.  No aortic stenosis.    Allergies  Allergen Reactions   Codeine Nausea Only    Outpatient Encounter Medications as of 09/11/2021  Medication Sig   acetaminophen (TYLENOL) 500 MG tablet Take 2 tablets (1,000 mg total) by mouth 2 (two) times daily.   amiodarone (PACERONE) 100 MG tablet Take 100 mg by mouth daily.   apixaban (ELIQUIS) 5 MG TABS tablet Take 1 tablet (5 mg total) by mouth 2 (two) times daily.   atorvastatin (LIPITOR) 80 MG tablet Take 1 tablet (80 mg total) by mouth daily.   Cholecalciferol (VITAMIN D3) 50 MCG (2000 UT) TABS Take 2,000 Units by mouth daily after breakfast.   docusate sodium (COLACE) 100 MG capsule Take 1 capsule (100 mg total) by mouth daily.   fexofenadine (ALLEGRA) 180 MG tablet  Take 1 tablet (180 mg total) by mouth daily as needed for allergies or rhinitis.   fluticasone (FLONASE) 50 MCG/ACT nasal spray Place 1 spray into both nostrils daily as needed for allergies or rhinitis.   furosemide (LASIX) 20 MG tablet Take 20 mg by mouth 3 (three) times a week.   lidocaine (SALONPAS PAIN RELIEVING)  4 % Place 1 patch onto the skin daily.   losartan (COZAAR) 25 MG tablet Take 25 mg by mouth daily.   melatonin 5 MG TABS Take 5 mg by mouth at bedtime.   methocarbamol (ROBAXIN) 500 MG tablet Take 250 mg by mouth as needed.   metoprolol succinate (TOPROL-XL) 50 MG 24 hr tablet Take 1 tablet (50 mg total) by mouth daily. Take with or immediately following a meal.   nitroGLYCERIN (NITROSTAT) 0.4 MG SL tablet Place 0.4 mg under the tongue as needed for chest pain (Every 5 minutes x 3 doses).   nystatin (MYCOSTATIN/NYSTOP) powder Apply 1 Application topically 2 (two) times daily.   omeprazole (PRILOSEC OTC) 20 MG tablet Take 1 tablet (20 mg total) by mouth daily as needed.   polyethylene glycol (MIRALAX / GLYCOLAX) 17 g packet Take 17 g by mouth daily. Give with 4 oz of water.   potassium chloride SA (KLOR-CON M) 20 MEQ tablet Take 20 mEq by mouth 3 (three) times a week.   senna-docusate (SENOKOT-S) 8.6-50 MG tablet Take 2 tablets by mouth at bedtime.   traMADol (ULTRAM) 50 MG tablet Take 1 tablet (50 mg total) by mouth every 6 (six) hours as needed.   traMADol (ULTRAM) 50 MG tablet Take 0.5 tablets (25 mg total) by mouth at bedtime.   vitamin B-12 (CYANOCOBALAMIN) 1000 MCG tablet Take 1,000 mcg by mouth daily.   zinc oxide 20 % ointment Apply 1 application topically 3 (three) times daily as needed for irritation. Apply to buttocks after every incontinent episode and as needed for redness.   No facility-administered encounter medications on file as of 09/11/2021.    Review of Systems  Constitutional:  Negative for activity change, appetite change and fever.  HENT:  Negative for  congestion, hearing loss and voice change.   Eyes:  Negative for visual disturbance.  Respiratory:  Negative for chest tightness and shortness of breath.   Cardiovascular:  Positive for leg swelling.  Gastrointestinal:  Negative for abdominal pain and constipation.  Genitourinary:  Negative for dysuria and urgency.  Musculoskeletal:  Positive for arthralgias, back pain and gait problem.       S/p L TKR,  left hip pain is better  Skin:  Negative for color change.          Neurological:  Negative for speech difficulty and headaches.       Memory lapses.   Hematological:  Does not bruise/bleed easily.  Psychiatric/Behavioral:  Positive for sleep disturbance. Negative for behavioral problems. The patient is not nervous/anxious.     Immunization History  Administered Date(s) Administered   Fluad Quad(high Dose 65+) 10/28/2020   Influenza, High Dose Seasonal PF 10/21/2016, 09/24/2017, 10/25/2019   Influenza-Unspecified 10/03/2011, 10/04/2012, 01/13/2016   Moderna SARS-COV2 Booster Vaccination 11/21/2019, 06/11/2020   Moderna Sars-Covid-2 Vaccination 01/16/2019, 02/13/2019, 03/13/2019   Pneumococcal-Unspecified 01/13/2016   Tetanus 12/01/2017   Zoster Recombinat (Shingrix) 08/24/2018, 02/22/2020   Pertinent  Health Maintenance Due  Topic Date Due   HEMOGLOBIN A1C  12/01/2019   OPHTHALMOLOGY EXAM  05/30/2021   INFLUENZA VACCINE  08/12/2021   FOOT EXAM  10/17/2021   DEXA SCAN  Completed      09/12/2020    2:57 PM 10/17/2020    2:53 PM 02/07/2021   12:47 PM 06/19/2021   12:11 AM 09/11/2021    3:39 PM  Fall Risk  Falls in the past year? 0 0 1  0  Was there an injury with Fall? 0 0 0  0  Fall Risk Category Calculator 0 0 1  0  Fall Risk Category Low Low Low  Low  Patient Fall Risk Level Low fall risk Low fall risk Moderate fall risk Moderate fall risk Low fall risk  Patient at Risk for Falls Due to No Fall Risks No Fall Risks History of fall(s);Impaired balance/gait;Impaired  mobility;Orthopedic patient  No Fall Risks  Patient at Risk for Falls Due to - Comments   spinal stenosis of lumbar    Fall risk Follow up Falls evaluation completed Falls evaluation completed Falls evaluation completed;Education provided;Falls prevention discussed  Falls evaluation completed   Functional Status Survey:    Vitals:   09/11/21 1120  BP: 121/67  Pulse: 76  Resp: 18  Temp: 98.5 F (36.9 C)  SpO2: 93%  Weight: 183 lb 14.4 oz (83.4 kg)   Body mass index is 27.96 kg/m. Physical Exam Constitutional:      Appearance: Normal appearance.  HENT:     Head: Normocephalic and atraumatic.     Nose: Nose normal.     Mouth/Throat:     Mouth: Mucous membranes are moist.  Eyes:     Extraocular Movements: Extraocular movements intact.     Pupils: Pupils are equal, round, and reactive to light.  Cardiovascular:     Rate and Rhythm: Normal rate and regular rhythm.     Heart sounds: Murmur heard.  Pulmonary:     Effort: Pulmonary effort is normal.     Breath sounds: Normal breath sounds. No rales.  Abdominal:     General: Bowel sounds are normal.     Palpations: Abdomen is soft.     Tenderness: There is no abdominal tenderness.  Musculoskeletal:     Cervical back: Normal range of motion and neck supple.     Right lower leg: Edema present.     Left lower leg: Edema present.     Comments: Trace edema BLE  Skin:    General: Skin is warm and dry.     Comments: From previous exam: BLE cool to touch, dependent rubor noted. Mild venous insufficiency skin changes above ankles.    Neurological:     General: No focal deficit present.     Mental Status: She is alert and oriented to person, place, and time. Mental status is at baseline.     Cranial Nerves: No cranial nerve deficit.     Motor: No weakness.     Coordination: Coordination normal.     Gait: Gait abnormal.  Psychiatric:        Mood and Affect: Mood normal.        Behavior: Behavior normal.        Thought Content:  Thought content normal.        Judgment: Judgment normal.     Labs reviewed: Recent Labs    03/26/21 0000 04/30/21 0000 06/19/21 0006 06/19/21 0010  NA 140 140 140 141  K 4.4 4.2 4.1 4.0  CL 106 103 104 103  CO2 27* 26* 28  --   GLUCOSE  --   --  108* 106*  BUN '21 16 14 17  '$ CREATININE 0.9 0.9 0.85 0.80  CALCIUM 9.2 9.5 9.8  --    Recent Labs    12/02/20 0000 03/26/21 0000  AST 17 15  ALT 15 12  ALKPHOS 43 47  ALBUMIN 3.6 3.8   Recent Labs    12/02/20 0000 03/26/21 0000 05/01/21 0000 06/19/21 0006 06/19/21 0010  WBC 6.0 6.5 6.9  7.2  --   NEUTROABS 10.70 3,627.00  --  3.5  --   HGB 12.4 11.9* 11.7* 13.2 13.9  HCT 37 35* 35* 41.8 41.0  MCV  --   --   --  105.0*  --   PLT 248 226 221 216  --    Lab Results  Component Value Date   TSH 1.390 07/03/2021   Lab Results  Component Value Date   HGBA1C 5.9 (H) 05/31/2019   Lab Results  Component Value Date   CHOL 169 04/05/2020   HDL 70 04/05/2020   LDLCALC 74 04/05/2020   TRIG 150 (H) 04/05/2020   CHOLHDL 2.4 04/05/2020    Significant Diagnostic Results in last 30 days:  No results found.  Assessment/Plan Osteoarthritis of multiple joints  The left hip pain, travels down to the left knee, worsens at night, improved, 04/07/21. X-ray left hip/pelvis r/o acute process. OA, left knee s/p TKR, MR lumbar, severe spinal 11/13/20 spinal and foraminal stenosis with left sciatica, taking Tramadol, Tylenol, Methocarbamol.   TIA (transient ischemic attack) stable, on Eliquis, Atorvastatin.   Constipation  stable, on Senna, MiraLax.   Paroxysmal atrial fibrillation/flutter (HCC)  stable, on Senna, MiraLax.   Essential hypertension Blood pressure is controlled, takes Losartan, Metoprolol.   Gastroesophageal reflux disease stable, takes Omeprazole, Hgb 13.9 06/19/21  Coronary artery disease involving coronary bypass graft of native heart with angina pectoris (HCC) stable, Hx of CABG Cath 04/25/19, prn NTG, takes  Atorvastatin. LDL 74 3/25/2  CKD (chronic kidney disease) stage 3, GFR 30-59 ml/min (HCC) Bun/creat 17/0.80 06/19/21  OSA on CPAP  Insomnia/OSA, on CPAP, prn Lorazepam  Insomnia secondary to depression with anxiety Prn Lorazepam helps, off Sertraline 2/2 confusion.  Mild memory disturbance  MMSE 27/30, SNF for supportive care. Vit B12 324 07/03/21, on Vit B12 1069mg qd.   Edema  mild, EF 60% 08/03/21, Furosemide 3x/wk     Family/ staff Communication: plan of care reviewed with the patient and charge nurse.   Labs/tests ordered:  none  Time spend 35 minutes.

## 2021-09-11 NOTE — Assessment & Plan Note (Signed)
stable, on Eliquis, Atorvastatin.  

## 2021-09-11 NOTE — Assessment & Plan Note (Signed)
stable, takes Omeprazole, Hgb 13.9 06/19/21 

## 2021-09-11 NOTE — Assessment & Plan Note (Signed)
Insomnia/OSA, on CPAP, prn Lorazepam

## 2021-09-11 NOTE — Assessment & Plan Note (Signed)
stable, on Senna, MiraLax.  

## 2021-09-11 NOTE — Assessment & Plan Note (Signed)
Blood pressure is controlled, takes Losartan, Metoprolol.  

## 2021-09-11 NOTE — Assessment & Plan Note (Signed)
Bun/creat 17/0.80 06/19/21

## 2021-09-11 NOTE — Assessment & Plan Note (Signed)
MMSE 27/30, SNF for supportive care. Vit B12 324 07/03/21, on Vit B12 1000mcg qd.  

## 2021-09-11 NOTE — Assessment & Plan Note (Signed)
mild, EF 60% 08/03/21, Furosemide 3x/wk

## 2021-09-11 NOTE — Assessment & Plan Note (Signed)
Prn Lorazepam helps, off Sertraline 2/2 confusion.

## 2021-09-11 NOTE — Assessment & Plan Note (Signed)
The left hip pain, travels down to the left knee, worsens at night, improved, 04/07/21. X-ray left hip/pelvis r/o acute process. OA, left knee s/p TKR, MR lumbar, severe spinal 11/13/20 spinal and foraminal stenosis with left sciatica, taking Tramadol, Tylenol, Methocarbamol.

## 2021-09-12 ENCOUNTER — Encounter: Payer: Self-pay | Admitting: Nurse Practitioner

## 2021-09-12 DIAGNOSIS — R2689 Other abnormalities of gait and mobility: Secondary | ICD-10-CM | POA: Diagnosis not present

## 2021-09-12 DIAGNOSIS — R262 Difficulty in walking, not elsewhere classified: Secondary | ICD-10-CM | POA: Diagnosis not present

## 2021-09-12 DIAGNOSIS — N189 Chronic kidney disease, unspecified: Secondary | ICD-10-CM | POA: Diagnosis not present

## 2021-09-12 DIAGNOSIS — R29898 Other symptoms and signs involving the musculoskeletal system: Secondary | ICD-10-CM | POA: Diagnosis not present

## 2021-09-12 DIAGNOSIS — Z9181 History of falling: Secondary | ICD-10-CM | POA: Diagnosis not present

## 2021-09-12 DIAGNOSIS — I129 Hypertensive chronic kidney disease with stage 1 through stage 4 chronic kidney disease, or unspecified chronic kidney disease: Secondary | ICD-10-CM | POA: Diagnosis not present

## 2021-09-12 DIAGNOSIS — M6281 Muscle weakness (generalized): Secondary | ICD-10-CM | POA: Diagnosis not present

## 2021-09-16 DIAGNOSIS — R2689 Other abnormalities of gait and mobility: Secondary | ICD-10-CM | POA: Diagnosis not present

## 2021-09-16 DIAGNOSIS — R29898 Other symptoms and signs involving the musculoskeletal system: Secondary | ICD-10-CM | POA: Diagnosis not present

## 2021-09-16 DIAGNOSIS — I129 Hypertensive chronic kidney disease with stage 1 through stage 4 chronic kidney disease, or unspecified chronic kidney disease: Secondary | ICD-10-CM | POA: Diagnosis not present

## 2021-09-16 DIAGNOSIS — Z9181 History of falling: Secondary | ICD-10-CM | POA: Diagnosis not present

## 2021-09-16 DIAGNOSIS — R262 Difficulty in walking, not elsewhere classified: Secondary | ICD-10-CM | POA: Diagnosis not present

## 2021-09-16 DIAGNOSIS — M6281 Muscle weakness (generalized): Secondary | ICD-10-CM | POA: Diagnosis not present

## 2021-09-17 ENCOUNTER — Other Ambulatory Visit: Payer: Self-pay | Admitting: Adult Health

## 2021-09-17 DIAGNOSIS — M6281 Muscle weakness (generalized): Secondary | ICD-10-CM | POA: Diagnosis not present

## 2021-09-17 DIAGNOSIS — R29898 Other symptoms and signs involving the musculoskeletal system: Secondary | ICD-10-CM | POA: Diagnosis not present

## 2021-09-17 DIAGNOSIS — R262 Difficulty in walking, not elsewhere classified: Secondary | ICD-10-CM | POA: Diagnosis not present

## 2021-09-17 DIAGNOSIS — R2689 Other abnormalities of gait and mobility: Secondary | ICD-10-CM | POA: Diagnosis not present

## 2021-09-17 DIAGNOSIS — Z9181 History of falling: Secondary | ICD-10-CM | POA: Diagnosis not present

## 2021-09-17 DIAGNOSIS — I129 Hypertensive chronic kidney disease with stage 1 through stage 4 chronic kidney disease, or unspecified chronic kidney disease: Secondary | ICD-10-CM | POA: Diagnosis not present

## 2021-09-17 MED ORDER — TRAMADOL HCL 50 MG PO TABS: 25.0000 mg | ORAL_TABLET | Freq: Every day | ORAL | 0 refills | Status: DC

## 2021-09-18 DIAGNOSIS — I129 Hypertensive chronic kidney disease with stage 1 through stage 4 chronic kidney disease, or unspecified chronic kidney disease: Secondary | ICD-10-CM | POA: Diagnosis not present

## 2021-09-18 DIAGNOSIS — R262 Difficulty in walking, not elsewhere classified: Secondary | ICD-10-CM | POA: Diagnosis not present

## 2021-09-18 DIAGNOSIS — M6281 Muscle weakness (generalized): Secondary | ICD-10-CM | POA: Diagnosis not present

## 2021-09-18 DIAGNOSIS — R29898 Other symptoms and signs involving the musculoskeletal system: Secondary | ICD-10-CM | POA: Diagnosis not present

## 2021-09-18 DIAGNOSIS — Z9181 History of falling: Secondary | ICD-10-CM | POA: Diagnosis not present

## 2021-09-18 DIAGNOSIS — R2689 Other abnormalities of gait and mobility: Secondary | ICD-10-CM | POA: Diagnosis not present

## 2021-09-22 DIAGNOSIS — Z9181 History of falling: Secondary | ICD-10-CM | POA: Diagnosis not present

## 2021-09-22 DIAGNOSIS — R2689 Other abnormalities of gait and mobility: Secondary | ICD-10-CM | POA: Diagnosis not present

## 2021-09-22 DIAGNOSIS — I129 Hypertensive chronic kidney disease with stage 1 through stage 4 chronic kidney disease, or unspecified chronic kidney disease: Secondary | ICD-10-CM | POA: Diagnosis not present

## 2021-09-22 DIAGNOSIS — M6281 Muscle weakness (generalized): Secondary | ICD-10-CM | POA: Diagnosis not present

## 2021-09-22 DIAGNOSIS — R29898 Other symptoms and signs involving the musculoskeletal system: Secondary | ICD-10-CM | POA: Diagnosis not present

## 2021-09-22 DIAGNOSIS — R262 Difficulty in walking, not elsewhere classified: Secondary | ICD-10-CM | POA: Diagnosis not present

## 2021-09-23 ENCOUNTER — Encounter: Payer: Self-pay | Admitting: Family Medicine

## 2021-09-23 ENCOUNTER — Non-Acute Institutional Stay (SKILLED_NURSING_FACILITY): Payer: Medicare Other | Admitting: Family Medicine

## 2021-09-23 DIAGNOSIS — Z9181 History of falling: Secondary | ICD-10-CM | POA: Diagnosis not present

## 2021-09-23 DIAGNOSIS — I10 Essential (primary) hypertension: Secondary | ICD-10-CM

## 2021-09-23 DIAGNOSIS — R609 Edema, unspecified: Secondary | ICD-10-CM | POA: Diagnosis not present

## 2021-09-23 DIAGNOSIS — R269 Unspecified abnormalities of gait and mobility: Secondary | ICD-10-CM

## 2021-09-23 DIAGNOSIS — R29898 Other symptoms and signs involving the musculoskeletal system: Secondary | ICD-10-CM | POA: Diagnosis not present

## 2021-09-23 DIAGNOSIS — N183 Chronic kidney disease, stage 3 unspecified: Secondary | ICD-10-CM | POA: Diagnosis not present

## 2021-09-23 DIAGNOSIS — R413 Other amnesia: Secondary | ICD-10-CM | POA: Diagnosis not present

## 2021-09-23 DIAGNOSIS — R2689 Other abnormalities of gait and mobility: Secondary | ICD-10-CM | POA: Diagnosis not present

## 2021-09-23 DIAGNOSIS — I129 Hypertensive chronic kidney disease with stage 1 through stage 4 chronic kidney disease, or unspecified chronic kidney disease: Secondary | ICD-10-CM | POA: Diagnosis not present

## 2021-09-23 DIAGNOSIS — Z7901 Long term (current) use of anticoagulants: Secondary | ICD-10-CM | POA: Diagnosis not present

## 2021-09-23 DIAGNOSIS — I25709 Atherosclerosis of coronary artery bypass graft(s), unspecified, with unspecified angina pectoris: Secondary | ICD-10-CM

## 2021-09-23 DIAGNOSIS — R262 Difficulty in walking, not elsewhere classified: Secondary | ICD-10-CM | POA: Diagnosis not present

## 2021-09-23 DIAGNOSIS — M6281 Muscle weakness (generalized): Secondary | ICD-10-CM | POA: Diagnosis not present

## 2021-09-23 NOTE — Progress Notes (Unsigned)
Location:  [facility] Room Number: 34-A Place of Service:  SNF [id number]) Provider:  Lillette Boxer. Rinaldo Cloud, MD  Patient Care Team: Virgie Dad, MD as PCP - General (Internal Medicine) Leonie Man, MD as PCP - Cardiology (Cardiology) Mast, [name], NP as Nurse Practitioner (Internal Medicine) Lavonna Monarch, MD as Consulting Physician (Dermatology)  Extended Emergency Contact Information Primary Emergency Contact: [place] Mobile Phone: [phone] Relation: Daughter Secondary Emergency Contact: Weeks,Tiffany Mobile Phone: [phone] Relation: Daughter Preferred language: English Interpreter needed? No  Code Status:  DNR Goals of care: Advanced Directive information    09/23/2021   11:59 AM  Advanced Directives  Does Patient Have a Medical Advance Directive? Yes  Type of Paramedic of [place];Living will;Out of facility DNR (pink MOST or yellow form)  Does patient want to make changes to medical advance directive? No - Patient declined  Copy of [place] in Chart? Yes - validated most recent copy scanned in chart (See row information)  Pre-existing out of facility DNR order (yellow form or pink MOST form) Pink MOST/Yellow Form most recent copy in chart - Physician notified to receive inpatient order     Chief Complaint  Patient presents with   Routine    HPI:  Pt is a 88 y.o. female seen today for medical management of chronic diseases.     Past Medical History:  Diagnosis Date   Atherosclerosis of native coronary artery 2001   LAD & Cx disease -- BMS PCI in 2001; CABG 2002.  PCI in 2006 (per-op [facility])   Atrial fibrillation and flutter ([place]) 04/2019   New diagnosis-major complaint was chest tightness and dyspnea.   Breast cancer ([place])    Cervix dysplasia    GERD (gastroesophageal reflux disease)    Heart attack ([place]) 2002, 2006   Both events reportedly were  perioperatively. She has no recollection. Initial PCI was related to angina symptoms (bilateral arm pain)   High cholesterol    Hypertension    Lumbosacral spinal stenosis    Osteoarthritis    Skin cancer    Sleep apnea    Uses CPAP faithfully   Squamous cell carcinoma of skin 02/12/2020   in situ- left neck-anterior (CX35FU)   Past Surgical History:  Procedure Laterality Date   ABDOMINAL HYSTERECTOMY     bone density     CARDIAC CATHETERIZATION  2001    Dr. Glade Lloyd [facility]) 2 vessel CAD involving LAD-D1 and distal Cx-OM. Appearance of dilated tapered left main with no significant atherosclerosis on IVUS --  BMS PCI of pLAD ([facility] BMS 3.0 mm x 15 mm) & with PTCA of ostial D1, PTCA of dLAD.  BMS PCI pCx [facility] BMS 3.5 mm x 11 mm) - unable to reopen dCx-OM (probable distal Atheroembolic occlusion.  Normal LVEF.   COLONOSCOPY     CORONARY ARTERY BYPASS GRAFT  2002   Unsure of details [facility], in [facility])   [place]  2001   ([place]) [facility] BMS PCI pLAD (3.0 mm x 15 mm) -PTCA of jailed D1 as well as distal LAD; pCx ([facility] BMS 3.5 mm x 11 mm) -unable to open distalCx-OM -thromboembolic occlusion   DIAGNOSTIC MAMMOGRAM     LEFT HEART CATH AND CORS/GRAFTS ANGIOGRAPHY N/A 04/25/2019   Procedure: LEFT HEART CATH AND CORS/GRAFTS ANGIOGRAPHY;  Surgeon: Troy Sine, MD;  Location: [facility] INV[facility] CV LAB;;; ost-prox LCx stent @ OM1 ostium =100% CTO, Ost-prox  LAD stent ~30% ISR, Ost D1 CTO, prox RCA ~30%. SCG-OM2 patent with ostial stent 30%, LIMA-LAD patent; CTO of SVG-D1.    left knee replacement     MASTECTOMY     NM MYOVIEW LTD  12/2014; 2018   Cardiologist (Dr. Kellie Simmering.  [place], [place] ph# [phone]) -->LEXISCAN: Nonischemic, "normal "   pap smear     tonsilectomy     TRANSTHORACIC ECHOCARDIOGRAM  12/2014   Echocardiogram 12/25/14- normal LVEF and systolic function, EF 55-73%, mild grade 1 diastolic dysfunction   TRANSTHORACIC ECHOCARDIOGRAM  04/24/2019    EF 65 to 70%.  Moderate septal LVH.  GR 1 DD-elevated EDP.  Normal PAP.  Relatively normal valves.  No aortic stenosis.    Allergies  Allergen Reactions   Codeine Nausea Only    Outpatient Encounter Medications as of 09/23/2021  Medication Sig   acetaminophen (TYLENOL) 500 MG tablet Take 2 tablets (1,000 mg total) by mouth 2 (two) times daily.   amiodarone (PACERONE) 100 MG tablet Take 100 mg by mouth daily.   Ammonium Lactate (AMLACTIN EX) thin layer topically; topical Once A Day - PRN Special Instructions: May apply topically once daily PRN for dry skin.   apixaban (ELIQUIS) 5 MG TABS tablet Take 1 tablet (5 mg total) by mouth 2 (two) times daily.   atorvastatin (LIPITOR) 80 MG tablet Take 1 tablet (80 mg total) by mouth daily.   Cholecalciferol (VITAMIN D3) 50 MCG (2000 UT) TABS Take 2,000 Units by mouth daily after breakfast.   docusate sodium (COLACE) 100 MG capsule Take 1 capsule (100 mg total) by mouth daily.   fexofenadine (ALLEGRA) 180 MG tablet Take 1 tablet (180 mg total) by mouth daily as needed for allergies or rhinitis.   fluticasone (FLONASE) 50 MCG/ACT nasal spray Place 1 spray into both nostrils daily as needed for allergies or rhinitis.   lidocaine (SALONPAS PAIN RELIEVING) 4 % Place 1 patch onto the skin daily.   losartan (COZAAR) 25 MG tablet Take 25 mg by mouth daily.   melatonin 5 MG TABS Take 5 mg by mouth at bedtime.   methocarbamol (ROBAXIN) 500 MG tablet Take 250 mg by mouth as needed.   metoprolol succinate (TOPROL-XL) 50 MG 24 hr tablet Take 1 tablet (50 mg total) by mouth daily. Take with or immediately following a meal.   nitroGLYCERIN (NITROSTAT) 0.4 MG SL tablet Place 0.4 mg under the tongue as needed for chest pain (Every 5 minutes x 3 doses).   nystatin (MYCOSTATIN/NYSTOP) powder Apply 1 Application topically 2 (two) times daily.   omeprazole (PRILOSEC OTC) 20 MG tablet Take 1 tablet (20 mg total) by mouth daily as needed.   polyethylene glycol (MIRALAX /  GLYCOLAX) 17 g packet Take 17 g by mouth daily. Give with 4 oz of water.   senna-docusate (SENOKOT-S) 8.6-50 MG tablet Take 2 tablets by mouth at bedtime.   traMADol (ULTRAM) 50 MG tablet Take 1 tablet (50 mg total) by mouth every 6 (six) hours as needed.   traMADol (ULTRAM) 50 MG tablet Take 0.5 tablets (25 mg total) by mouth at bedtime.   vitamin B-12 (CYANOCOBALAMIN) 1000 MCG tablet Take 1,000 mcg by mouth daily.   zinc oxide 20 % ointment Apply 1 application topically 3 (three) times daily as needed for irritation. Apply to buttocks after every incontinent episode and as needed for redness.   [DISCONTINUED] furosemide (LASIX) 20 MG tablet Take 20 mg by mouth 3 (three) times a week.   [DISCONTINUED] potassium chloride  SA (KLOR-CON M) 20 MEQ tablet Take 20 mEq by mouth 3 (three) times a week.   No facility-administered encounter medications on file as of 09/23/2021.    Review of Systems  Immunization History  Administered Date(s) Administered   Fluad Quad(high Dose 65+) 10/28/2020   Influenza, High Dose Seasonal PF 10/21/2016, 09/24/2017, 10/25/2019   Influenza-Unspecified 10/03/2011, 10/04/2012, 01/13/2016   Moderna SARS-COV2 Booster Vaccination 11/21/2019, 06/11/2020, 05/30/2021   Moderna Sars-Covid-2 Vaccination 01/16/2019, 02/13/2019, 03/13/2019   PNEUMOCOCCAL CONJUGATE-20 07/03/2021   Pneumococcal-Unspecified 01/13/2016   Tetanus 12/01/2017   Zoster Recombinat (Shingrix) 08/24/2018, 02/22/2020   Pertinent  Health Maintenance Due  Topic Date Due   HEMOGLOBIN A1C  12/01/2019   OPHTHALMOLOGY EXAM  05/30/2021   INFLUENZA VACCINE  08/12/2021   FOOT EXAM  10/17/2021   DEXA SCAN  Completed      09/12/2020    2:57 PM 10/17/2020    2:53 PM 02/07/2021   12:47 PM 06/19/2021   12:11 AM 09/11/2021    3:39 PM  Fall Risk  Falls in the past year? 0 0 1  0  Was there an injury with Fall? 0 0 0  0  Fall Risk Category Calculator 0 0 1  0  Fall Risk Category Low Low Low  Low  Patient Fall  Risk Level Low fall risk Low fall risk Moderate fall risk Moderate fall risk Low fall risk  Patient at Risk for Falls Due to No Fall Risks No Fall Risks History of fall(s);Impaired balance/gait;Impaired mobility;Orthopedic patient  No Fall Risks  Patient at Risk for Falls Due to - Comments   spinal stenosis of lumbar    Fall risk Follow up Falls evaluation completed Falls evaluation completed Falls evaluation completed;Education provided;Falls prevention discussed  Falls evaluation completed   Functional Status Survey:    Vitals:   09/23/21 1152  BP: 137/69  Pulse: 79  Resp: 17  Temp: (!) 96.9 F (36.1 C)  SpO2: 92%  Weight: 186 lb 14.4 oz (84.8 kg)  Height: '5\' 8"'$  (1.727 m)   Body mass index is 28.42 kg/m. Physical Exam  Labs reviewed: Recent Labs    03/26/21 0000 04/30/21 0000 06/19/21 0006 06/19/21 0010  NA 140 140 140 141  K 4.4 4.2 4.1 4.0  CL 106 103 104 103  CO2 27* 26* 28  --   GLUCOSE  --   --  108* 106*  BUN '21 16 14 17  '$ CREATININE 0.9 0.9 0.85 0.80  CALCIUM 9.2 9.5 9.8  --    Recent Labs    12/02/20 0000 03/26/21 0000  AST 17 15  ALT 15 12  ALKPHOS 43 47  ALBUMIN 3.6 3.8   Recent Labs    12/02/20 0000 03/26/21 0000 05/01/21 0000 06/19/21 0006 06/19/21 0010  WBC 6.0 6.5 6.9 7.2  --   NEUTROABS 10.70 3,627.00  --  3.5  --   HGB 12.4 11.9* 11.7* 13.2 13.9  HCT 37 35* 35* 41.8 41.0  MCV  --   --   --  105.0*  --   PLT 248 226 221 216  --    Lab Results  Component Value Date   TSH 1.390 07/03/2021   Lab Results  Component Value Date   HGBA1C 5.9 (H) 05/31/2019   Lab Results  Component Value Date   CHOL 169 04/05/2020   HDL 70 04/05/2020   LDLCALC 74 04/05/2020   TRIG 150 (H) 04/05/2020   CHOLHDL 2.4 04/05/2020    Significant Diagnostic Results  in last 30 days:  No results found.  Assessment/Plan There are no diagnoses linked to this encounter.   Family/ staff Communication: ***  Labs/tests ordered:  ***

## 2021-09-24 DIAGNOSIS — I129 Hypertensive chronic kidney disease with stage 1 through stage 4 chronic kidney disease, or unspecified chronic kidney disease: Secondary | ICD-10-CM | POA: Diagnosis not present

## 2021-09-24 DIAGNOSIS — R2689 Other abnormalities of gait and mobility: Secondary | ICD-10-CM | POA: Diagnosis not present

## 2021-09-24 DIAGNOSIS — M6281 Muscle weakness (generalized): Secondary | ICD-10-CM | POA: Diagnosis not present

## 2021-09-24 DIAGNOSIS — Z9181 History of falling: Secondary | ICD-10-CM | POA: Diagnosis not present

## 2021-09-24 DIAGNOSIS — R262 Difficulty in walking, not elsewhere classified: Secondary | ICD-10-CM | POA: Diagnosis not present

## 2021-09-24 DIAGNOSIS — R29898 Other symptoms and signs involving the musculoskeletal system: Secondary | ICD-10-CM | POA: Diagnosis not present

## 2021-09-25 DIAGNOSIS — Z9181 History of falling: Secondary | ICD-10-CM | POA: Diagnosis not present

## 2021-09-25 DIAGNOSIS — R29898 Other symptoms and signs involving the musculoskeletal system: Secondary | ICD-10-CM | POA: Diagnosis not present

## 2021-09-25 DIAGNOSIS — R262 Difficulty in walking, not elsewhere classified: Secondary | ICD-10-CM | POA: Diagnosis not present

## 2021-09-25 DIAGNOSIS — I129 Hypertensive chronic kidney disease with stage 1 through stage 4 chronic kidney disease, or unspecified chronic kidney disease: Secondary | ICD-10-CM | POA: Diagnosis not present

## 2021-09-25 DIAGNOSIS — R2689 Other abnormalities of gait and mobility: Secondary | ICD-10-CM | POA: Diagnosis not present

## 2021-09-25 DIAGNOSIS — M6281 Muscle weakness (generalized): Secondary | ICD-10-CM | POA: Diagnosis not present

## 2021-09-29 DIAGNOSIS — R2689 Other abnormalities of gait and mobility: Secondary | ICD-10-CM | POA: Diagnosis not present

## 2021-09-29 DIAGNOSIS — R262 Difficulty in walking, not elsewhere classified: Secondary | ICD-10-CM | POA: Diagnosis not present

## 2021-09-29 DIAGNOSIS — R29898 Other symptoms and signs involving the musculoskeletal system: Secondary | ICD-10-CM | POA: Diagnosis not present

## 2021-09-29 DIAGNOSIS — M6281 Muscle weakness (generalized): Secondary | ICD-10-CM | POA: Diagnosis not present

## 2021-09-29 DIAGNOSIS — I129 Hypertensive chronic kidney disease with stage 1 through stage 4 chronic kidney disease, or unspecified chronic kidney disease: Secondary | ICD-10-CM | POA: Diagnosis not present

## 2021-09-29 DIAGNOSIS — Z9181 History of falling: Secondary | ICD-10-CM | POA: Diagnosis not present

## 2021-09-30 DIAGNOSIS — Z9181 History of falling: Secondary | ICD-10-CM | POA: Diagnosis not present

## 2021-09-30 DIAGNOSIS — M6281 Muscle weakness (generalized): Secondary | ICD-10-CM | POA: Diagnosis not present

## 2021-09-30 DIAGNOSIS — R2689 Other abnormalities of gait and mobility: Secondary | ICD-10-CM | POA: Diagnosis not present

## 2021-09-30 DIAGNOSIS — R262 Difficulty in walking, not elsewhere classified: Secondary | ICD-10-CM | POA: Diagnosis not present

## 2021-09-30 DIAGNOSIS — I129 Hypertensive chronic kidney disease with stage 1 through stage 4 chronic kidney disease, or unspecified chronic kidney disease: Secondary | ICD-10-CM | POA: Diagnosis not present

## 2021-09-30 DIAGNOSIS — R29898 Other symptoms and signs involving the musculoskeletal system: Secondary | ICD-10-CM | POA: Diagnosis not present

## 2021-10-01 DIAGNOSIS — M6281 Muscle weakness (generalized): Secondary | ICD-10-CM | POA: Diagnosis not present

## 2021-10-01 DIAGNOSIS — I129 Hypertensive chronic kidney disease with stage 1 through stage 4 chronic kidney disease, or unspecified chronic kidney disease: Secondary | ICD-10-CM | POA: Diagnosis not present

## 2021-10-01 DIAGNOSIS — R262 Difficulty in walking, not elsewhere classified: Secondary | ICD-10-CM | POA: Diagnosis not present

## 2021-10-01 DIAGNOSIS — Z9181 History of falling: Secondary | ICD-10-CM | POA: Diagnosis not present

## 2021-10-01 DIAGNOSIS — R2689 Other abnormalities of gait and mobility: Secondary | ICD-10-CM | POA: Diagnosis not present

## 2021-10-01 DIAGNOSIS — R29898 Other symptoms and signs involving the musculoskeletal system: Secondary | ICD-10-CM | POA: Diagnosis not present

## 2021-10-02 DIAGNOSIS — I129 Hypertensive chronic kidney disease with stage 1 through stage 4 chronic kidney disease, or unspecified chronic kidney disease: Secondary | ICD-10-CM | POA: Diagnosis not present

## 2021-10-02 DIAGNOSIS — R2689 Other abnormalities of gait and mobility: Secondary | ICD-10-CM | POA: Diagnosis not present

## 2021-10-02 DIAGNOSIS — R29898 Other symptoms and signs involving the musculoskeletal system: Secondary | ICD-10-CM | POA: Diagnosis not present

## 2021-10-02 DIAGNOSIS — Z9181 History of falling: Secondary | ICD-10-CM | POA: Diagnosis not present

## 2021-10-02 DIAGNOSIS — M6281 Muscle weakness (generalized): Secondary | ICD-10-CM | POA: Diagnosis not present

## 2021-10-02 DIAGNOSIS — R262 Difficulty in walking, not elsewhere classified: Secondary | ICD-10-CM | POA: Diagnosis not present

## 2021-10-03 DIAGNOSIS — I129 Hypertensive chronic kidney disease with stage 1 through stage 4 chronic kidney disease, or unspecified chronic kidney disease: Secondary | ICD-10-CM | POA: Diagnosis not present

## 2021-10-03 DIAGNOSIS — Z9181 History of falling: Secondary | ICD-10-CM | POA: Diagnosis not present

## 2021-10-03 DIAGNOSIS — R262 Difficulty in walking, not elsewhere classified: Secondary | ICD-10-CM | POA: Diagnosis not present

## 2021-10-03 DIAGNOSIS — R2689 Other abnormalities of gait and mobility: Secondary | ICD-10-CM | POA: Diagnosis not present

## 2021-10-03 DIAGNOSIS — M6281 Muscle weakness (generalized): Secondary | ICD-10-CM | POA: Diagnosis not present

## 2021-10-03 DIAGNOSIS — R29898 Other symptoms and signs involving the musculoskeletal system: Secondary | ICD-10-CM | POA: Diagnosis not present

## 2021-10-05 ENCOUNTER — Encounter: Payer: Self-pay | Admitting: Internal Medicine

## 2021-10-06 ENCOUNTER — Ambulatory Visit (INDEPENDENT_AMBULATORY_CARE_PROVIDER_SITE_OTHER): Payer: Medicare Other | Admitting: Adult Health

## 2021-10-06 ENCOUNTER — Encounter: Payer: Self-pay | Admitting: Nurse Practitioner

## 2021-10-06 ENCOUNTER — Encounter: Payer: Self-pay | Admitting: Adult Health

## 2021-10-06 ENCOUNTER — Non-Acute Institutional Stay (SKILLED_NURSING_FACILITY): Payer: Medicare Other | Admitting: Nurse Practitioner

## 2021-10-06 ENCOUNTER — Telehealth: Payer: Self-pay | Admitting: Adult Health

## 2021-10-06 VITALS — BP 107/64 | HR 69

## 2021-10-06 DIAGNOSIS — K219 Gastro-esophageal reflux disease without esophagitis: Secondary | ICD-10-CM

## 2021-10-06 DIAGNOSIS — Z8673 Personal history of transient ischemic attack (TIA), and cerebral infarction without residual deficits: Secondary | ICD-10-CM

## 2021-10-06 DIAGNOSIS — E538 Deficiency of other specified B group vitamins: Secondary | ICD-10-CM

## 2021-10-06 DIAGNOSIS — I25119 Atherosclerotic heart disease of native coronary artery with unspecified angina pectoris: Secondary | ICD-10-CM

## 2021-10-06 DIAGNOSIS — R269 Unspecified abnormalities of gait and mobility: Secondary | ICD-10-CM

## 2021-10-06 DIAGNOSIS — N183 Chronic kidney disease, stage 3 unspecified: Secondary | ICD-10-CM | POA: Diagnosis not present

## 2021-10-06 DIAGNOSIS — R609 Edema, unspecified: Secondary | ICD-10-CM

## 2021-10-06 DIAGNOSIS — I10 Essential (primary) hypertension: Secondary | ICD-10-CM

## 2021-10-06 DIAGNOSIS — M159 Polyosteoarthritis, unspecified: Secondary | ICD-10-CM | POA: Diagnosis not present

## 2021-10-06 DIAGNOSIS — F339 Major depressive disorder, recurrent, unspecified: Secondary | ICD-10-CM

## 2021-10-06 DIAGNOSIS — H539 Unspecified visual disturbance: Secondary | ICD-10-CM | POA: Diagnosis not present

## 2021-10-06 DIAGNOSIS — I480 Paroxysmal atrial fibrillation: Secondary | ICD-10-CM | POA: Diagnosis not present

## 2021-10-06 DIAGNOSIS — R413 Other amnesia: Secondary | ICD-10-CM | POA: Diagnosis not present

## 2021-10-06 DIAGNOSIS — G459 Transient cerebral ischemic attack, unspecified: Secondary | ICD-10-CM | POA: Diagnosis not present

## 2021-10-06 DIAGNOSIS — K5901 Slow transit constipation: Secondary | ICD-10-CM | POA: Diagnosis not present

## 2021-10-06 NOTE — Assessment & Plan Note (Signed)
MMSE 27/30, SNF for supportive care. Vit B12 324 07/03/21, on Vit B12 1000mcg qd.  

## 2021-10-06 NOTE — Assessment & Plan Note (Signed)
Bun/creat 17/0.9 08/26/21

## 2021-10-06 NOTE — Assessment & Plan Note (Signed)
stable, takes Omeprazole, Hgb 13.9 06/19/21 

## 2021-10-06 NOTE — Telephone Encounter (Signed)
medicare/AAPR NPR sent to GI

## 2021-10-06 NOTE — Assessment & Plan Note (Signed)
stable, Hx of CABG Cath 04/25/19, prn NTG, takes Atorvastatin. LDL 74 3/25/2

## 2021-10-06 NOTE — Assessment & Plan Note (Addendum)
mild, EF 60% 08/03/21, off Furosemide,  Bun/creat 17/0.9 08/26/21

## 2021-10-06 NOTE — Assessment & Plan Note (Signed)
left knee s/p TKR, MR lumbar, severe spinal 11/13/20 spinal and foraminal stenosis with left sciatica, taking Tramadol, Tylenol, Methocarbamol.

## 2021-10-06 NOTE — Assessment & Plan Note (Signed)
Blood pressure is controlled, takes Losartan, Metoprolol.  

## 2021-10-06 NOTE — Assessment & Plan Note (Signed)
stable, on Senna, MiraLax.  

## 2021-10-06 NOTE — Assessment & Plan Note (Addendum)
the patient has been emotional, teary for the past few weeks. Sertraline was associated with confusion in the past. Will try Lexapro '10mg'$  qd, observe the patient. F/u BMP 2 weeks.

## 2021-10-06 NOTE — Progress Notes (Signed)
Location:   SNF [facility] Room Number: 034/A Place of Service:  SNF (31) Provider: Arna Snipe Zaven Klemens NP  Treshaun Carrico X, NP  Patient Care Team: Jayelyn Barno X, NP as PCP - General (Internal Medicine) Marykay Lex, MD as PCP - Cardiology (Cardiology) Darsi Tien X, NP as Nurse Practitioner (Internal Medicine) Janalyn Harder, MD (Inactive) as Consulting Physician (Dermatology)  Extended Emergency Contact Information Primary Emergency Contact: Johnson,Carrie Mobile Phone: [phone] Relation: Daughter Secondary Emergency Contact: Weeks,Tiffany Mobile Phone: [phone] Relation: Daughter Preferred language: English Interpreter needed? No  Code Status: DNR Goals of care: Advanced Directive information    10/07/2021    8:38 AM  Advanced Directives  Does Patient Have a Medical Advance Directive? Yes  Type of Estate agent of [place];Out of facility DNR (pink MOST or yellow form)  Copy of Healthcare Power of Attorney in Chart? Yes - validated most recent copy scanned in chart (See row information)  Pre-existing out of facility DNR order (yellow form or pink MOST form) Yellow form placed in chart (order not valid for inpatient use);Pink Most/Yellow Form available - Physician notified to receive inpatient order     Chief Complaint  Patient presents with   Acute Visit    Depression    HPI:  Pt is a 88 y.o. female seen today for an acute visit for depression, the patient has been emotional, teary for the past few weeks. Sertraline was associated with confusion in the past   TIA, stable, on Eliquis, Atorvastatin.  CVA, Neurology 10/06/21 cerebral infarction,MRI chronic right cerebellar stroke, mild to moderate generalized atrophy, small vessel disease with multiple chronic lacunar infarcts, possible left sided stroke not seen on imaging,  numbness in tongue, R sided deficits,  transient vision disturbance of R eye.              The left hip pain, travels down  to the left knee, worsens at night, improved, 04/07/21. X-ray left hip/pelvis r/o acute process. OA, left knee s/p TKR, MR lumbar, severe spinal 11/13/20 spinal and foraminal stenosis with left sciatica, taking Tramadol, Tylenol, Methocarbamol.              Constipation, stable, on Senna, MiraLax.              Afib, takes Metoprolol, Eliquis, Amiodarone, f/u cardiology. TSH 1.39 07/03/21             HTN, takes Losartan, Metoprolol.              GERD, stable, takes Omeprazole, Hgb 13.9 06/19/21             CAD, stable, Hx of CABG Cath 04/25/19, prn NTG, takes Atorvastatin. LDL 74 3/25/2             CKD Bun/creat 17/0.9 08/26/21             Insomnia/OSA, on CPAP, prn Lorazepam             Depression, emotional/teary,  off Sertraline 2/2 confusion.              Vascular cognitive impairment, MMSE 27/30, SNF for supportive care. Vit B12 324 07/03/21, on Vit B12 qd.              BLE edema, mild, EF 60% 08/03/21, off Furosemide, Bun/creat 17/0.9 08/26/21     Past Medical History:  Diagnosis Date   Atherosclerosis of native coronary artery 2001   LAD & Cx disease -- BMS PCI in 2001;  CABG 2002.  PCI in 2006 (per-op [facility])   Atrial fibrillation and flutter (HCC) 04/2019   New diagnosis-major complaint was chest tightness and dyspnea.   Breast cancer (HCC)    Cervix dysplasia    GERD (gastroesophageal reflux disease)    Heart attack (HCC) 2002, 2006   Both events reportedly were perioperatively. She has no recollection. Initial PCI was related to angina symptoms (bilateral arm pain)   High cholesterol    Hypertension    Lumbosacral spinal stenosis    Osteoarthritis    Skin cancer    Sleep apnea    Uses CPAP faithfully   Squamous cell carcinoma of skin 02/12/2020   in situ- left neck-anterior (CX35FU)   Past Surgical History:  Procedure Laterality Date   ABDOMINAL HYSTERECTOMY     bone density     CARDIAC CATHETERIZATION  2001    Dr. Aleen Campi [facility]) 2 vessel CAD involving LAD-D1 and  distal Cx-OM. Appearance of dilated tapered left main with no significant atherosclerosis on IVUS --  BMS PCI of pLAD ([facility] BMS 3.0 mm x 15 mm) & with PTCA of ostial D1, PTCA of dLAD.  BMS PCI pCx [facility] BMS 3.5 mm x 11 mm) - unable to reopen dCx-OM (probable distal Atheroembolic occlusion.  Normal LVEF.   COLONOSCOPY     CORONARY ARTERY BYPASS GRAFT  2002   Unsure of details [facility], in [facility])   CORONARY STENT INTERVENTION  2001   ([place]) [facility] BMS PCI pLAD (3.0 mm x 15 mm) -PTCA of jailed D1 as well as distal LAD; pCx ([facility]ale BMS 3.5 mm x 11 mm) -unable to open distalCx-OM -thromboembolic occlusion   DIAGNOSTIC MAMMOGRAM     LEFT HEART CATH AND CORS/GRAFTS ANGIOGRAPHY N/A 04/25/2019   Procedure: LEFT HEART CATH AND CORS/GRAFTS ANGIOGRAPHY;  Surgeon: Lennette Bihari, MD;  Location: [facility] INVASIVE CV LAB;;; ost-prox LCx stent @ OM1 ostium =100% CTO, Ost-prox LAD stent ~30% ISR, Ost D1 CTO, prox RCA ~30%. SCG-OM2 patent with ostial stent 30%, LIMA-LAD patent; CTO of SVG-D1.    left knee replacement     MASTECTOMY     NM MYOVIEW LTD  12/2014; 2018   Cardiologist (Dr. Asa Saunas.  [place], [place] ph# [phone]) -->LEXISCAN: Nonischemic, "normal "   pap smear     tonsilectomy     TRANSTHORACIC ECHOCARDIOGRAM  12/2014   Echocardiogram 12/25/14- normal LVEF and systolic function, EF 50-55%, mild grade 1 diastolic dysfunction   TRANSTHORACIC ECHOCARDIOGRAM  04/24/2019   EF 65 to 70%.  Moderate septal LVH.  GR 1 DD-elevated EDP.  Normal PAP.  Relatively normal valves.  No aortic stenosis.    Allergies  Allergen Reactions   Codeine Nausea Only    Allergies as of 10/06/2021       Reactions   Codeine Nausea Only        Medication List        Accurate as of October 06, 2021 11:59 PM. If you have any questions, ask your nurse or doctor.          acetaminophen 500 MG tablet Commonly known as: TYLENOL Take 2 tablets (1,000 mg total) by mouth 2 (two) times daily.    amiodarone 100 MG tablet Commonly known as: PACERONE Take 100 mg by mouth daily.   AMLACTIN EX thin layer topically; topical Once A Day - PRN Special Instructions: May apply topically once daily PRN for dry skin.   apixaban 5 MG Tabs tablet Commonly known as: Eliquis  Take 1 tablet (5 mg total) by mouth 2 (two) times daily.   atorvastatin 80 MG tablet Commonly known as: LIPITOR Take 1 tablet (80 mg total) by mouth daily.   cyanocobalamin 1000 MCG tablet Commonly known as: VITAMIN B12 Take 1,000 mcg by mouth daily.   docusate sodium 100 MG capsule Commonly known as: COLACE Take 1 capsule (100 mg total) by mouth daily.   fexofenadine 180 MG tablet Commonly known as: ALLEGRA Take 1 tablet (180 mg total) by mouth daily as needed for allergies or rhinitis.   fluticasone 50 MCG/ACT nasal spray Commonly known as: FLONASE Place 1 spray into both nostrils daily as needed for allergies or rhinitis.   losartan 25 MG tablet Commonly known as: COZAAR Take 25 mg by mouth daily.   melatonin 5 MG Tabs Take 5 mg by mouth at bedtime.   methocarbamol 500 MG tablet Commonly known as: ROBAXIN Take 250 mg by mouth as needed.   metoprolol succinate 50 MG 24 hr tablet Commonly known as: TOPROL-XL Take 1 tablet (50 mg total) by mouth daily. Take with or immediately following a meal.   nitroGLYCERIN 0.4 MG SL tablet Commonly known as: NITROSTAT Place 0.4 mg under the tongue as needed for chest pain (Every 5 minutes x 3 doses).   nystatin powder Commonly known as: MYCOSTATIN/NYSTOP Apply 1 Application topically 2 (two) times daily.   omeprazole 20 MG tablet Commonly known as: PRILOSEC OTC Take 1 tablet (20 mg total) by mouth daily as needed.   polyethylene glycol 17 g packet Commonly known as: MIRALAX / GLYCOLAX Take 17 g by mouth daily. Give with 4 oz of water.   Salonpas Pain Relieving 4 % Generic drug: lidocaine Place 1 patch onto the skin daily.   senna-docusate 8.6-50 MG  tablet Commonly known as: Senokot-S Take 2 tablets by mouth at bedtime.   traMADol 50 MG tablet Commonly known as: Ultram Take 1 tablet (50 mg total) by mouth every 6 (six) hours as needed.   traMADol 50 MG tablet Commonly known as: ULTRAM Take 0.5 tablets (25 mg total) by mouth at bedtime.   Vitamin D3 50 MCG (2000 UT) Tabs Take 2,000 Units by mouth daily after breakfast.   zinc oxide 20 % ointment Apply 1 application topically 3 (three) times daily as needed for irritation. Apply to buttocks after every incontinent episode and as needed for redness.        Review of Systems  Constitutional:  Negative for activity change, appetite change and fever.  HENT:  Negative for congestion, hearing loss and voice change.   Eyes:  Negative for visual disturbance.  Respiratory:  Negative for chest tightness and shortness of breath.   Cardiovascular:  Positive for leg swelling.  Gastrointestinal:  Negative for abdominal pain and constipation.  Genitourinary:  Negative for dysuria and urgency.  Musculoskeletal:  Positive for arthralgias, back pain and gait problem.       S/p L TKR,  left hip pain is better  Skin:  Negative for color change.          Neurological:  Negative for speech difficulty and headaches.       Memory lapses.   Hematological:  Does not bruise/bleed easily.  Psychiatric/Behavioral:  Positive for dysphoric mood and sleep disturbance. Negative for behavioral problems. The patient is nervous/anxious.        Emotional, teary.     Immunization History  Administered Date(s) Administered   Fluad Quad(high Dose 65+) 10/28/2020   Influenza, High  Dose Seasonal PF 10/21/2016, 09/24/2017, 10/25/2019   Influenza-Unspecified 10/03/2011, 10/04/2012, 01/13/2016   Moderna SARS-COV2 Booster Vaccination 11/21/2019, 06/11/2020, 05/30/2021   Moderna Sars-Covid-2 Vaccination 01/16/2019, 02/13/2019, 03/13/2019   PNEUMOCOCCAL CONJUGATE-20 07/03/2021   Pneumococcal-Unspecified  01/13/2016   Tetanus 12/01/2017   Zoster Recombinat (Shingrix) 08/24/2018, 02/22/2020   Pertinent  Health Maintenance Due  Topic Date Due   HEMOGLOBIN A1C  12/01/2019   OPHTHALMOLOGY EXAM  05/30/2021   INFLUENZA VACCINE  08/12/2021   FOOT EXAM  10/17/2021   DEXA SCAN  Completed      09/12/2020    2:57 PM 10/17/2020    2:53 PM 02/07/2021   12:47 PM 06/19/2021   12:11 AM 09/11/2021    3:39 PM  Fall Risk  Falls in the past year? 0 0 1  0  Was there an injury with Fall? 0 0 0  0  Fall Risk Category Calculator 0 0 1  0  Fall Risk Category Low Low Low  Low  Patient Fall Risk Level Low fall risk Low fall risk Moderate fall risk Moderate fall risk Low fall risk  Patient at Risk for Falls Due to No Fall Risks No Fall Risks History of fall(s);Impaired balance/gait;Impaired mobility;Orthopedic patient  No Fall Risks  Patient at Risk for Falls Due to - Comments   spinal stenosis of lumbar    Fall risk Follow up Falls evaluation completed Falls evaluation completed Falls evaluation completed;Education provided;Falls prevention discussed  Falls evaluation completed   Functional Status Survey:    Vitals:   10/07/21 0857  BP: 116/63  Pulse: 78  Resp: 17  Temp: (!) 96.9 F (36.1 C)  SpO2: 92%  Weight: 186 lb 14.4 oz (84.8 kg)  Height: 5\' 8"  (1.727 m)   Body mass index is 28.42 kg/m. Physical Exam Constitutional:      Appearance: Normal appearance.  HENT:     Head: Normocephalic and atraumatic.     Nose: Nose normal.     Mouth/Throat:     Mouth: Mucous membranes are moist.  Eyes:     Extraocular Movements: Extraocular movements intact.     Pupils: Pupils are equal, round, and reactive to light.  Cardiovascular:     Rate and Rhythm: Normal rate and regular rhythm.     Heart sounds: Murmur heard.  Pulmonary:     Effort: Pulmonary effort is normal.     Breath sounds: Normal breath sounds. No rales.  Abdominal:     General: Bowel sounds are normal.     Palpations: Abdomen is soft.      Tenderness: There is no abdominal tenderness.  Musculoskeletal:     Cervical back: Normal range of motion and neck supple.     Right lower leg: Edema present.     Left lower leg: Edema present.     Comments: Trace edema BLE. Slightly R arm weakness.   Skin:    General: Skin is warm and dry.     Comments: From previous exam: BLE cool to touch, dependent rubor noted. Mild venous insufficiency skin changes above ankles.  S/p right mastectomy.   Neurological:     General: No focal deficit present.     Mental Status: She is alert and oriented to person, place, and time. Mental status is at baseline.     Cranial Nerves: No cranial nerve deficit.     Motor: No weakness.     Coordination: Coordination normal.     Gait: Gait abnormal.     Comments: C/o numbness  of tongue.   Psychiatric:        Behavior: Behavior normal.        Thought Content: Thought content normal.     Comments: Appears sad     Labs reviewed: Recent Labs    04/30/21 0000 06/19/21 0006 06/19/21 0010 08/26/21 0000  NA 140 140 141 141  K 4.2 4.1 4.0 4.1  CL 103 104 103 103  CO2 26* 28  --  29*  GLUCOSE  --  108* 106*  --   BUN 16 14 17 17   CREATININE 0.9 0.85 0.80 0.9  CALCIUM 9.5 9.8  --  10.0   Recent Labs    12/02/20 0000 03/26/21 0000  AST 17 15  ALT 15 12  ALKPHOS 43 47  ALBUMIN 3.6 3.8   Recent Labs    12/02/20 0000 03/26/21 0000 05/01/21 0000 06/19/21 0006 06/19/21 0010  WBC 6.0 6.5 6.9 7.2  --   NEUTROABS 10.70 3,627.00  --  3.5  --   HGB 12.4 11.9* 11.7* 13.2 13.9  HCT 37 35* 35* 41.8 41.0  MCV  --   --   --  105.0*  --   PLT 248 226 221 216  --    Lab Results  Component Value Date   TSH 1.390 07/03/2021   Lab Results  Component Value Date   HGBA1C 5.9 (H) 05/31/2019   Lab Results  Component Value Date   CHOL 175 07/03/2021   HDL 59 07/03/2021   LDLCALC 91 07/03/2021   TRIG 146 07/03/2021   CHOLHDL 2.4 04/05/2020    Significant Diagnostic Results in last 30 days:   No results found.  Assessment/Plan: Recurrent depression (HCC) the patient has been emotional, teary for the past few weeks. Sertraline was associated with confusion in the past. Will try Lexapro 10mg  qd, observe the patient. F/u BMP 2 weeks.   Mild memory disturbance MMSE 27/30, SNF for supportive care. Vit B12 324 07/03/21, on Vit B12 qd.   Edema mild, EF 60% 08/03/21, off Furosemide,  Bun/creat 17/0.9 08/26/21  CKD (chronic kidney disease) stage 3, GFR 30-59 ml/min (HCC) Bun/creat 17/0.9 08/26/21  Coronary artery disease involving native coronary artery of native heart with angina pectoris (HCC) stable, Hx of CABG Cath 04/25/19, prn NTG, takes Atorvastatin. LDL 74 3/25/2  Gastroesophageal reflux disease stable, takes Omeprazole, Hgb 13.9 06/19/21  Essential hypertension Blood pressure is controlled,  takes Losartan, Metoprolol.  Paroxysmal atrial fibrillation/flutter (HCC) takes Metoprolol, Eliquis, Amiodarone, f/u cardiology. TSH 1.39 07/03/21  Constipation  stable, on Senna, MiraLax.   Osteoarthritis of multiple joints left knee s/p TKR, MR lumbar, severe spinal 11/13/20 spinal and foraminal stenosis with left sciatica, taking Tramadol, Tylenol, Methocarbamol.   TIA (transient ischemic attack) stable, on Eliquis, Atorvastatin.     Family/ staff Communication: plan of care reviewed with the patient and charge nurse.   Labs/tests ordered:  none  Time spend 35 minutes.

## 2021-10-06 NOTE — Patient Instructions (Addendum)
Please ensure you are working with speech therapy for continued speech difficulty and memory concerns - if not and orders are needed, please let me know  Continue working with physical therapy - use of rolling walker at all times unless otherwise instructed   We will recheck B12 level today to ensure this has improved since starting B12 supplement   Please reach out to your sleep provider regarding difficulty using CPAP machine  Please let us know who your eye doctor is and we can try to request records from your recent visit  You will be called to complete imaging of your head vessels with a CTA to look for any blockages or abnormalities

## 2021-10-06 NOTE — Progress Notes (Signed)
[facility] [address] [place]. [place] [address] [phone]       OFFICE FOLLOW UP NOTE  [name] Date of Birth:  Jun 04, 1933 Medical Record Number:  [id number]   Primary neurologist: Dr. Krista Shea Reason for visit: gait abnormality, chronic strokes on imaging    SUBJECTIVE:   CHIEF COMPLAINT:  Chief Complaint  Patient presents with   Follow-up gait    Pt reports feeling okay. She states she is still having trouble talking and eating. She states her right side is numb. Room 2 with son.     HPI:   Stephanie Shea, is a 88 year old female, accompanied by her son, seen in request by her primary care doctor Stephanie Shea for evaluation of slurred speech, frequent fall, initial evaluation was on July 03, 2021 with Dr. Krista Shea   I reviewed and summarized the referring note. PMHX. HTN A fib CAD, s/p CABG HLD OSA-CPAP History of right breast cancer, s/p lobectomy, chemotherapy, radiation therapy.   Consult visit 07/03/2021 Dr. Krista Shea: She moved to independent living at friend's home with her sister about 5 years ago, used to be fairly active, she suffered a major fall in November 2022, while stepping up to the bus she fell, landed on her knee,  After few months of rehabilitation, she was able to go to assisted living, walk using a walker,  About a month ago, she fell after getting up from a chair, then she developed worsening gait abnormality, she also noted to have slurred speech, right tongue numbness, difficulty chewing, on today's examination, she was found to have memory loss, which has been slow worsening over the past couple years, MoCA examination 18/30, right inferior visual field deficit, right arm more than leg weakness   Update 10/06/2021 Stephanie Shea: Returns for follow-up visit after prior visit 3 months ago with Dr. Krista Shea.  She is accompanied by her son.  She continues to reside at a [facility] SNF.   She continues to experience right-sided numbness,  difficulty speaking, difficulty chewing and memory difficulty. Denies any worsening since prior visit but denies any improvement  Also reports right eye visual loss upon awakening, gets better after a couple hours, has been seen by eye doctor but she is unsure of who she saw or what they said  Denies new stroke/TIA symptoms or new neurological issues Has not been using CPAP over the past couple of months due to difficulty tolerating, followed by [facility] sleep center, has not further discussed this concern with their office  Reports currently working with PT, denies previously working with SLP but son believes she may be but unable to say for sure. Ambulating with RW with PT otherwise use of w/c for transportation  MRI brain showed moderate to advanced chronic small vessel disease including multiple small chronic lacunar infarcts within the bilateral cerebral hemisphere white matter, subcentimeter chronic infarct in the right cerebellar hemisphere and mild to moderate generalized cerebral atrophy.  Carotid Dopplers 1 to 39% stenosis bilaterally  2D echo EF 60 to 65%, no significant abnormalities  B12 324 (06/2021), currently on supplement      ROS:   14 system review of systems performed and negative with exception of those listed in HPI  PMH:  Past Medical History:  Diagnosis Date   Atherosclerosis of native coronary artery 2001   LAD & Cx disease -- BMS PCI in 2001; CABG 2002.  PCI in 2006 (per-op TAH)   Atrial fibrillation and flutter ([place]) 04/2019   New diagnosis-major  complaint was chest tightness and dyspnea.   Breast cancer ([place])    Cervix dysplasia    GERD (gastroesophageal reflux disease)    Heart attack ([place]) 2002, 2006   Both events reportedly were perioperatively. She has no recollection. Initial PCI was related to angina symptoms (bilateral arm pain)   High cholesterol    Hypertension    Lumbosacral spinal stenosis    Osteoarthritis    Skin cancer    Sleep apnea     Uses CPAP faithfully   Squamous cell carcinoma of skin 02/12/2020   in situ- left neck-anterior (CX35FU)    PSH:  Past Surgical History:  Procedure Laterality Date   ABDOMINAL HYSTERECTOMY     bone density     CARDIAC CATHETERIZATION  2001    Dr. Glade Shea [facility]) 2 vessel CAD involving LAD-D1 and distal Cx-OM. Appearance of dilated tapered left main with no significant atherosclerosis on IVUS --  BMS PCI of pLAD ([facility] BMS 3.0 mm x 15 mm) & with PTCA of ostial D1, PTCA of dLAD.  BMS PCI pCx [facility] BMS 3.5 mm x 11 mm) - unable to reopen dCx-OM (probable distal Atheroembolic occlusion.  Normal LVEF.   COLONOSCOPY     CORONARY ARTERY BYPASS GRAFT  2002   Unsure of details [facility], in [facility])   [place]  2001   ([place]) [facility] BMS PCI pLAD (3.0 mm x 15 mm) -PTCA of jailed D1 as well as distal LAD; pCx ([facility] BMS 3.5 mm x 11 mm) -unable to open distalCx-OM -thromboembolic occlusion   DIAGNOSTIC MAMMOGRAM     LEFT HEART CATH AND CORS/GRAFTS ANGIOGRAPHY N/A 04/25/2019   Procedure: LEFT HEART CATH AND CORS/GRAFTS ANGIOGRAPHY;  Surgeon: Stephanie Sine, MD;  Location: [facility] INVASIVE CV LAB;;; ost-prox LCx stent @ OM1 ostium =100% CTO, Ost-prox LAD stent ~30% ISR, Ost D1 CTO, prox RCA ~30%. SCG-OM2 patent with ostial stent 30%, LIMA-LAD patent; CTO of SVG-D1.    left knee replacement     MASTECTOMY     NM MYOVIEW LTD  12/2014; 2018   Cardiologist (Dr. Kellie Shea.  [place], [place] ph# [phone]) -->LEXISCAN: Nonischemic, "normal "   pap smear     tonsilectomy     TRANSTHORACIC ECHOCARDIOGRAM  12/2014   Echocardiogram 12/25/14- normal LVEF and systolic function, EF 96-78%, mild grade 1 diastolic dysfunction   TRANSTHORACIC ECHOCARDIOGRAM  04/24/2019   EF 65 to 70%.  Moderate septal LVH.  GR 1 DD-elevated EDP.  Normal PAP.  Relatively normal valves.  No aortic stenosis.    Social History:  Social History   Socioeconomic History   Marital status: Widowed    Spouse  name: Not on file   Number of children: Not on file   Years of education: Not on file   Highest education level: Not on file  Occupational History   Not on file  Tobacco Use   Smoking status: Never   Smokeless tobacco: Never  Vaping Use   Vaping Use: Never used  Substance and Sexual Activity   Alcohol use: No   Drug use: No   Sexual activity: Not Currently  Other Topics Concern   Not on file  Social History Narrative   Recently moved back to [place] (lives alone in a [place] apartment at [facility])   Do you drink/eat things with caffeine? 2 cups of coffee every day   What year were you married? [place] - twice widowed   No pets.  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 Has POA      Johnny Bridge (Daughter) [phone])      Primary Emergency Contact: Mui,JOHN L   Address: [place],  [place] [address]   Home Phone: [phone]   Social Determinants of Health   Financial Resource Strain: Low Risk  (02/07/2021)   Overall Financial Resource Strain (CARDIA)    Difficulty of Paying Living Expenses: Not hard at all  Food Insecurity: No Food Insecurity (02/07/2021)   Hunger Vital Sign    Worried About Running Out of Food in the Last Year: Never true    Ran Out of Food in the Last Year: Never true  Transportation Needs: No Transportation Needs (02/07/2021)   PRAPARE - Hydrologist (Medical): No    Lack of Transportation (Non-Medical): No  Physical Activity: Sufficiently Active (02/07/2021)   Exercise Vital Sign    Days of Exercise per Week: 5 days    Minutes of Exercise per Session: 30 min  Stress: No Stress Concern Present (02/07/2021)   [place]    Feeling of Stress : Only a little  Social Connections: Moderately Isolated (02/07/2021)    Social Connection and Isolation Panel [NHANES]    Frequency of Communication with Friends and Family: More than three times a week    Frequency of Social Gatherings with Friends and Family: More than three times a week    Attends Religious Services: More than 4 times per year    Active Member of [employer] or Organizations: No    Attends Archivist Meetings: Never    Marital Status: Widowed  Intimate Partner Violence: Not At Risk (02/07/2021)   Humiliation, Afraid, Rape, and Kick questionnaire    Fear of Current or Ex-Partner: No    Emotionally Abused: No    Physically Abused: No    Sexually Abused: No    Family History:  Family History  Problem Relation Age of Onset   Stroke Mother    Heart disease Father     Medications:   Current Outpatient Medications on File Prior to Visit  Medication Sig Dispense Refill   acetaminophen (TYLENOL) 500 MG tablet Take 2 tablets (1,000 mg total) by mouth 2 (two) times daily. 30 tablet 0   amiodarone (PACERONE) 100 MG tablet Take 100 mg by mouth daily.     Ammonium Lactate (AMLACTIN EX) thin layer topically; topical Once A Day - PRN Special Instructions: May apply topically once daily PRN for dry skin.     apixaban (ELIQUIS) 5 MG TABS tablet Take 1 tablet (5 mg total) by mouth 2 (two) times daily. 30 tablet 0   atorvastatin (LIPITOR) 80 MG tablet Take 1 tablet (80 mg total) by mouth daily. 30 tablet 0   Cholecalciferol (VITAMIN D3) 50 MCG (2000 UT) TABS Take 2,000 Units by mouth daily after breakfast. 30 tablet 0   docusate sodium (COLACE) 100 MG capsule Take 1 capsule (100 mg total) by mouth daily. 30 capsule 0   fexofenadine (ALLEGRA) 180 MG tablet Take 1 tablet (180 mg total) by mouth daily as needed for allergies or rhinitis. 30 tablet 0   fluticasone (FLONASE) 50 MCG/ACT nasal spray Place 1 spray into both nostrils daily as needed for allergies or rhinitis. 30 mL 0  lidocaine (SALONPAS PAIN RELIEVING) 4 % Place 1 patch onto the skin  daily.     losartan (COZAAR) 25 MG tablet Take 25 mg by mouth daily.     melatonin 5 MG TABS Take 5 mg by mouth at bedtime.     methocarbamol (ROBAXIN) 500 MG tablet Take 250 mg by mouth as needed.     metoprolol succinate (TOPROL-XL) 50 MG 24 hr tablet Take 1 tablet (50 mg total) by mouth daily. Take with or immediately following a meal. 30 tablet 0   nitroGLYCERIN (NITROSTAT) 0.4 MG SL tablet Place 0.4 mg under the tongue as needed for chest pain (Every 5 minutes x 3 doses).     nystatin (MYCOSTATIN/NYSTOP) powder Apply 1 Application topically 2 (two) times daily.     omeprazole (PRILOSEC OTC) 20 MG tablet Take 1 tablet (20 mg total) by mouth daily as needed. 30 tablet 0   polyethylene glycol (MIRALAX / GLYCOLAX) 17 g packet Take 17 g by mouth daily. Give with 4 oz of water. 30 each 0   senna-docusate (SENOKOT-S) 8.6-50 MG tablet Take 2 tablets by mouth at bedtime.     traMADol (ULTRAM) 50 MG tablet Take 1 tablet (50 mg total) by mouth every 6 (six) hours as needed. 60 tablet 0   traMADol (ULTRAM) 50 MG tablet Take 0.5 tablets (25 mg total) by mouth at bedtime. 15 tablet 0   vitamin B-12 (CYANOCOBALAMIN) 1000 MCG tablet Take 1,000 mcg by mouth daily.     zinc oxide 20 % ointment Apply 1 application topically 3 (three) times daily as needed for irritation. Apply to buttocks after every incontinent episode and as needed for redness. 30 g 0   No current facility-administered medications on file prior to visit.    Allergies:   Allergies  Allergen Reactions   Codeine Nausea Only      OBJECTIVE:  Physical Exam  Vitals:   10/06/21 1456  BP: 107/64  Pulse: 69   There is no height or weight on file to calculate BMI. No results found.   General: well developed, well nourished, very pleasant elderly Caucasian female, seated, in no evident distress Head: head normocephalic and atraumatic.   Neck: supple with no carotid or supraclavicular bruits Cardiovascular: regular rate and rhythm,  no murmurs Musculoskeletal: no deformity Skin:  no rash/petichiae Vascular:  Normal pulses all extremities   Neurologic Exam Mental Status: Awake and fully alert. mild to moderate dysarthria. Unable to appreciate aphasia, follows commands without difficulty.  Recent and remote memory impaired.  Attention span, concentration and fund of knowledge impaired. Mood and affect appropriate.  Cranial Nerves: Pupils equal, briskly reactive to light. Extraocular movements full without nystagmus. Visual fields show right right homonymous lower quadrantanopsia.  HOH bilaterally. Facial sensation intact. Face, tongue, palate moves normally and symmetrically.   Motor: Normal strength, bulk and tone left upper and lower extremity.  Limited RUE movement, mild weakness proximal, decreased hand dexterity, mild right HF weakness.   Sensory.: intact to touch , pinprick , position and vibratory sensation.  Coordination: Rapid alternating movements impaired on right side. Finger-to-nose impaired right arm and heel-to-shin performed accurately bilaterally. Gait and Station: Stands from seated position with moderate difficulty and assistance.  Stance hunched. Only takes a few steps from chair to w/c. Unsteady. Further ambulation not tested as RW not present.  Reflexes: Hyperreflexia RUE, limited bilateral knee reflex due to knee pain     ASSESSMENT/PLAN: Stephanie Shea is a 9 y.o. year old female  Acute onset of right tongue numbness, right arm weakness, in May 2023, Multiple falls since then             On examination, she has dysarthria, right inferior visual deficit, right arm weakness, significant gait abnormality, Multiple vascular risk factors, hypertension, hyperlipidemia, aging, atrial fibrillation, on Eliquis 5 mg twice a day  MRI brain chronic right cerebellar stroke, mild to moderate generalized atrophy, small vessel disease with multiple chronic lacunar infarcts  Possible left sided stroke not seen on  imaging, right cerebellar stroke could be contributing to gait impairment but would not correlate with right-sided deficits  CUS 1 to 39% stenosis bilaterally, 2D echo EF 60 to 65%,  Complete CTA head for complete stroke work up and for further evaluation of transient OD vision loss - son will f/u regarding name of current ophthalmologist to obtain recent records. If this exam was unremarkable/normal, may consider neuro-ophthalmology evaluation Continue working with PT, will request start working with SLP (if not already) for both cognition and speech impairment  Advised to f/u with [facility] to discuss difficulty tolerating CPAP Continue to follow with PCP and cardiology for aggressive stroke risk factor management currently on Eliquis and atorvastatin                Memory loss              B12 level 324, continue supplement, repeat today  TSH WNL and RPR nonreactive  Suspect vascular cognitive impairment - will request patient start working with SLP (if not already) for cognitive therapy         Follow up can be determined after above completed, no further recommendations at this time     CC:  PCP: Mast, [name], NP    I spent 38 minutes of face-to-face and non-face-to-face time with patient and son.  This included previsit chart review, lab review, study review, order entry, electronic health record documentation, patient and son education and discussion regarding above diagnoses and treatment plan and answered all the questions to patient and son satisfaction   Frann Rider, [facility]  [facility] [address] [place], [place] [address]  Phone [phone] Fax [phone] Note: This document was prepared with digital dictation and possible smart phrase technology. Any transcriptional errors that result from this process are unintentional.

## 2021-10-06 NOTE — Assessment & Plan Note (Signed)
stable, on Eliquis, Atorvastatin.  

## 2021-10-06 NOTE — Assessment & Plan Note (Signed)
takes Metoprolol, Eliquis, Amiodarone, f/u cardiology. TSH 1.39 07/03/21 

## 2021-10-07 ENCOUNTER — Encounter: Payer: Self-pay | Admitting: Nurse Practitioner

## 2021-10-07 DIAGNOSIS — B351 Tinea unguium: Secondary | ICD-10-CM | POA: Diagnosis not present

## 2021-10-07 DIAGNOSIS — I129 Hypertensive chronic kidney disease with stage 1 through stage 4 chronic kidney disease, or unspecified chronic kidney disease: Secondary | ICD-10-CM | POA: Diagnosis not present

## 2021-10-07 DIAGNOSIS — R29898 Other symptoms and signs involving the musculoskeletal system: Secondary | ICD-10-CM | POA: Diagnosis not present

## 2021-10-07 DIAGNOSIS — M6281 Muscle weakness (generalized): Secondary | ICD-10-CM | POA: Diagnosis not present

## 2021-10-07 DIAGNOSIS — Z9181 History of falling: Secondary | ICD-10-CM | POA: Diagnosis not present

## 2021-10-07 DIAGNOSIS — E1159 Type 2 diabetes mellitus with other circulatory complications: Secondary | ICD-10-CM | POA: Diagnosis not present

## 2021-10-07 DIAGNOSIS — R2689 Other abnormalities of gait and mobility: Secondary | ICD-10-CM | POA: Diagnosis not present

## 2021-10-07 DIAGNOSIS — L84 Corns and callosities: Secondary | ICD-10-CM | POA: Diagnosis not present

## 2021-10-07 DIAGNOSIS — R262 Difficulty in walking, not elsewhere classified: Secondary | ICD-10-CM | POA: Diagnosis not present

## 2021-10-07 LAB — VITAMIN B12: Vitamin B-12: 730 pg/mL (ref 232–1245)

## 2021-10-07 NOTE — Progress Notes (Unsigned)
Location:  [facility]    Place of Service: [facility]   Provider:  Mast, [name], NP  Patient Care Team: Mast, [name], NP as PCP - General (Internal Medicine) Leonie Man, MD as PCP - Cardiology (Cardiology) Mast, [name], NP as Nurse Practitioner (Internal Medicine) Lavonna Monarch, MD (Inactive) as Consulting Physician (Dermatology)  Extended Emergency Contact Information Primary Emergency Contact: [place] Mobile Phone: [phone] Relation: Daughter Secondary Emergency Contact: Weeks,Tiffany Mobile Phone: [phone] Relation: Daughter Preferred language: English Interpreter needed? No  Code Status:  DNR Goals of care: Advanced Directive information    10/07/2021    8:38 AM  Advanced Directives  Does Patient Have a Medical Advance Directive? Yes  Type of Paramedic of [place];Out of facility DNR (pink MOST or yellow form)  Copy of [place] in Chart? Yes - validated most recent copy scanned in chart (See row information)  Pre-existing out of facility DNR order (yellow form or pink MOST form) Yellow form placed in chart (order not valid for inpatient use);Pink Most/Yellow Form available - Physician notified to receive inpatient order     No chief complaint on file.   HPI:  Pt is a 88 y.o. female seen today for an acute visit for    Past Medical History:  Diagnosis Date   Atherosclerosis of native coronary artery 2001   LAD & Cx disease -- BMS PCI in 2001; CABG 2002.  PCI in 2006 (per-op TAH)   Atrial fibrillation and flutter ([place]) 04/2019   New diagnosis-major complaint was chest tightness and dyspnea.   Breast cancer ([place])    Cervix dysplasia    GERD (gastroesophageal reflux disease)    Heart attack ([place]) 2002, 2006   Both events reportedly were perioperatively. She has no recollection. Initial PCI was related to angina symptoms (bilateral arm pain)   High cholesterol    Hypertension    Lumbosacral spinal stenosis     Osteoarthritis    Skin cancer    Sleep apnea    Uses CPAP faithfully   Squamous cell carcinoma of skin 02/12/2020   in situ- left neck-anterior ([id number])   Past Surgical History:  Procedure Laterality Date   ABDOMINAL HYSTERECTOMY     bone density     CARDIAC CATHETERIZATION  2001    Dr. Glade Lloyd [facility]) 2 vessel CAD involving LAD-D1 and distal Cx-OM. Appearance of dilated tapered left main with no significant atherosclerosis on IVUS --  BMS PCI of pLAD ([place] BMS 3.0 mm x 15 mm) & with PTCA of ostial D1, PTCA of dLAD.  BMS PCI pCx [facility] BMS 3.5 mm x 11 mm) - unable to reopen dCx-OM (probable distal Atheroembolic occlusion.  Normal LVEF.   COLONOSCOPY     CORONARY ARTERY BYPASS GRAFT  2002   Unsure of details [facility], in [facility])   [place]  2001   ([place]) [place] BMS PCI pLAD (3.0 mm x 15 mm) -PTCA of jailed D1 as well as distal LAD; pCx ([facility] BMS 3.5 mm x 11 mm) -unable to open distalCx-OM -thromboembolic occlusion   DIAGNOSTIC MAMMOGRAM     LEFT HEART CATH AND CORS/GRAFTS ANGIOGRAPHY N/A 04/25/2019   Procedure: LEFT HEART CATH AND CORS/GRAFTS ANGIOGRAPHY;  Surgeon: Troy Sine, MD;  Location: [facility];;; ost-prox LCx stent @ OM1 ostium =100% CTO, Ost-prox LAD stent ~30% ISR, Ost D1 CTO, prox RCA ~30%. SCG-OM2 patent with ostial stent 30%, LIMA-LAD patent; CTO of SVG-D1.  left knee replacement     MASTECTOMY     NM MYOVIEW LTD  12/2014; 2018   Cardiologist (Dr. Kellie Simmering.  [place], [place] ph# [phone]) -->LEXISCAN: Nonischemic, "normal "   pap smear     tonsilectomy     TRANSTHORACIC ECHOCARDIOGRAM  12/2014   Echocardiogram 12/25/14- normal LVEF and systolic function, EF 87-86%, mild grade 1 diastolic dysfunction   TRANSTHORACIC ECHOCARDIOGRAM  04/24/2019   EF 65 to 70%.  Moderate septal LVH.  GR 1 DD-elevated EDP.  Normal PAP.  Relatively normal valves.  No aortic stenosis.    Allergies  Allergen Reactions   Codeine Nausea  Only    Allergies as of 10/06/2021       Reactions   Codeine Nausea Only        Medication List        Accurate as of October 06, 2021 11:59 PM. If you have any questions, ask your nurse or doctor.          acetaminophen 500 MG tablet Commonly known as: TYLENOL Take 2 tablets (1,000 mg total) by mouth 2 (two) times daily.   amiodarone 100 MG tablet Commonly known as: PACERONE Take 100 mg by mouth daily.   AMLACTIN EX thin layer topically; topical Once A Day - PRN Special Instructions: May apply topically once daily PRN for dry skin.   apixaban 5 MG Tabs tablet Commonly known as: Eliquis Take 1 tablet (5 mg total) by mouth 2 (two) times daily.   atorvastatin 80 MG tablet Commonly known as: LIPITOR Take 1 tablet (80 mg total) by mouth daily.   cyanocobalamin 1000 MCG tablet Commonly known as: VITAMIN B12 Take 1,000 mcg by mouth daily.   docusate sodium 100 MG capsule Commonly known as: COLACE Take 1 capsule (100 mg total) by mouth daily.   fexofenadine 180 MG tablet Commonly known as: ALLEGRA Take 1 tablet (180 mg total) by mouth daily as needed for allergies or rhinitis.   fluticasone 50 MCG/ACT nasal spray Commonly known as: FLONASE Place 1 spray into both nostrils daily as needed for allergies or rhinitis.   losartan 25 MG tablet Commonly known as: COZAAR Take 25 mg by mouth daily.   melatonin 5 MG Tabs Take 5 mg by mouth at bedtime.   methocarbamol 500 MG tablet Commonly known as: ROBAXIN Take 250 mg by mouth as needed.   metoprolol succinate 50 MG 24 hr tablet Commonly known as: TOPROL-XL Take 1 tablet (50 mg total) by mouth daily. Take with or immediately following a meal.   nitroGLYCERIN 0.4 MG SL tablet Commonly known as: NITROSTAT Place 0.4 mg under the tongue as needed for chest pain (Every 5 minutes x 3 doses).   nystatin powder Commonly known as: MYCOSTATIN/NYSTOP Apply 1 Application topically 2 (two) times daily.   omeprazole  20 MG tablet Commonly known as: PRILOSEC OTC Take 1 tablet (20 mg total) by mouth daily as needed.   polyethylene glycol 17 g packet Commonly known as: MIRALAX / GLYCOLAX Take 17 g by mouth daily. Give with 4 oz of water.   Salonpas Pain Relieving 4 % Generic drug: lidocaine Place 1 patch onto the skin daily.   senna-docusate 8.6-50 MG tablet Commonly known as: Senokot-S Take 2 tablets by mouth at bedtime.   traMADol 50 MG tablet Commonly known as: Ultram Take 1 tablet (50 mg total) by mouth every 6 (six) hours as needed.   traMADol 50 MG tablet Commonly known as: ULTRAM Take 0.5 tablets (25  mg total) by mouth at bedtime.   Vitamin D3 50 MCG (2000 UT) Tabs Take 2,000 Units by mouth daily after breakfast.   zinc oxide 20 % ointment Apply 1 application topically 3 (three) times daily as needed for irritation. Apply to buttocks after every incontinent episode and as needed for redness.        Review of Systems  Immunization History  Administered Date(s) Administered   Fluad Quad(high Dose 65+) 10/28/2020   Influenza, High Dose Seasonal PF 10/21/2016, 09/24/2017, 10/25/2019   Influenza-Unspecified 10/03/2011, 10/04/2012, 01/13/2016   Moderna SARS-COV2 Booster Vaccination 11/21/2019, 06/11/2020, 05/30/2021   Moderna Sars-Covid-2 Vaccination 01/16/2019, 02/13/2019, 03/13/2019   PNEUMOCOCCAL CONJUGATE-20 07/03/2021   Pneumococcal-Unspecified 01/13/2016   Tetanus 12/01/2017   Zoster Recombinat (Shingrix) 08/24/2018, 02/22/2020   Pertinent  Health Maintenance Due  Topic Date Due   HEMOGLOBIN A1C  12/01/2019   OPHTHALMOLOGY EXAM  05/30/2021   INFLUENZA VACCINE  08/12/2021   FOOT EXAM  10/17/2021   DEXA SCAN  Completed      09/12/2020    2:57 PM 10/17/2020    2:53 PM 02/07/2021   12:47 PM 06/19/2021   12:11 AM 09/11/2021    3:39 PM  Fall Risk  Falls in the past year? 0 0 1  0  Was there an injury with Fall? 0 0 0  0  Fall Risk Category Calculator 0 0 1  0  Fall Risk  Category Low Low Low  Low  Patient Fall Risk Level Low fall risk Low fall risk Moderate fall risk Moderate fall risk Low fall risk  Patient at Risk for Falls Due to No Fall Risks No Fall Risks History of fall(s);Impaired balance/gait;Impaired mobility;Orthopedic patient  No Fall Risks  Patient at Risk for Falls Due to - Comments   spinal stenosis of lumbar    Fall risk Follow up Falls evaluation completed Falls evaluation completed Falls evaluation completed;Education provided;Falls prevention discussed  Falls evaluation completed   Functional Status Survey:    There were no vitals filed for this visit. There is no height or weight on file to calculate BMI. Physical Exam  Labs reviewed: Recent Labs    04/30/21 0000 06/19/21 0006 06/19/21 0010 08/26/21 0000  NA 140 140 141 141  K 4.2 4.1 4.0 4.1  CL 103 104 103 103  CO2 26* 28  --  29*  GLUCOSE  --  108* 106*  --   BUN '16 14 17 17  '$ CREATININE 0.9 0.85 0.80 0.9  CALCIUM 9.5 9.8  --  10.0   Recent Labs    12/02/20 0000 03/26/21 0000  AST 17 15  ALT 15 12  ALKPHOS 43 47  ALBUMIN 3.6 3.8   Recent Labs    12/02/20 0000 03/26/21 0000 05/01/21 0000 06/19/21 0006 06/19/21 0010  WBC 6.0 6.5 6.9 7.2  --   NEUTROABS 10.70 3,627.00  --  3.5  --   HGB 12.4 11.9* 11.7* 13.2 13.9  HCT 37 35* 35* 41.8 41.0  MCV  --   --   --  105.0*  --   PLT 248 226 221 216  --    Lab Results  Component Value Date   TSH 1.390 07/03/2021   Lab Results  Component Value Date   HGBA1C 5.9 (H) 05/31/2019   Lab Results  Component Value Date   CHOL 175 07/03/2021   HDL 59 07/03/2021   LDLCALC 91 07/03/2021   TRIG 146 07/03/2021   CHOLHDL 2.4 04/05/2020  Significant Diagnostic Results in last 30 days:  No results found.  Assessment/Plan There are no diagnoses linked to this encounter.   Family/ staff Communication:   Labs/tests ordered:

## 2021-10-09 DIAGNOSIS — Z9181 History of falling: Secondary | ICD-10-CM | POA: Diagnosis not present

## 2021-10-09 DIAGNOSIS — R262 Difficulty in walking, not elsewhere classified: Secondary | ICD-10-CM | POA: Diagnosis not present

## 2021-10-09 DIAGNOSIS — R2689 Other abnormalities of gait and mobility: Secondary | ICD-10-CM | POA: Diagnosis not present

## 2021-10-09 DIAGNOSIS — R29898 Other symptoms and signs involving the musculoskeletal system: Secondary | ICD-10-CM | POA: Diagnosis not present

## 2021-10-09 DIAGNOSIS — M6281 Muscle weakness (generalized): Secondary | ICD-10-CM | POA: Diagnosis not present

## 2021-10-09 DIAGNOSIS — I129 Hypertensive chronic kidney disease with stage 1 through stage 4 chronic kidney disease, or unspecified chronic kidney disease: Secondary | ICD-10-CM | POA: Diagnosis not present

## 2021-10-09 NOTE — Progress Notes (Signed)
This encounter was created in error - please disregard.

## 2021-10-10 DIAGNOSIS — R2689 Other abnormalities of gait and mobility: Secondary | ICD-10-CM | POA: Diagnosis not present

## 2021-10-10 DIAGNOSIS — M6281 Muscle weakness (generalized): Secondary | ICD-10-CM | POA: Diagnosis not present

## 2021-10-10 DIAGNOSIS — I129 Hypertensive chronic kidney disease with stage 1 through stage 4 chronic kidney disease, or unspecified chronic kidney disease: Secondary | ICD-10-CM | POA: Diagnosis not present

## 2021-10-10 DIAGNOSIS — R29898 Other symptoms and signs involving the musculoskeletal system: Secondary | ICD-10-CM | POA: Diagnosis not present

## 2021-10-10 DIAGNOSIS — R262 Difficulty in walking, not elsewhere classified: Secondary | ICD-10-CM | POA: Diagnosis not present

## 2021-10-10 DIAGNOSIS — Z9181 History of falling: Secondary | ICD-10-CM | POA: Diagnosis not present

## 2021-10-15 DIAGNOSIS — R262 Difficulty in walking, not elsewhere classified: Secondary | ICD-10-CM | POA: Diagnosis not present

## 2021-10-15 DIAGNOSIS — R2689 Other abnormalities of gait and mobility: Secondary | ICD-10-CM | POA: Diagnosis not present

## 2021-10-15 DIAGNOSIS — R29898 Other symptoms and signs involving the musculoskeletal system: Secondary | ICD-10-CM | POA: Diagnosis not present

## 2021-10-15 DIAGNOSIS — M6281 Muscle weakness (generalized): Secondary | ICD-10-CM | POA: Diagnosis not present

## 2021-10-16 DIAGNOSIS — R262 Difficulty in walking, not elsewhere classified: Secondary | ICD-10-CM | POA: Diagnosis not present

## 2021-10-16 DIAGNOSIS — M6281 Muscle weakness (generalized): Secondary | ICD-10-CM | POA: Diagnosis not present

## 2021-10-16 DIAGNOSIS — R29898 Other symptoms and signs involving the musculoskeletal system: Secondary | ICD-10-CM | POA: Diagnosis not present

## 2021-10-16 DIAGNOSIS — R2689 Other abnormalities of gait and mobility: Secondary | ICD-10-CM | POA: Diagnosis not present

## 2021-10-19 DIAGNOSIS — M6281 Muscle weakness (generalized): Secondary | ICD-10-CM | POA: Diagnosis not present

## 2021-10-19 DIAGNOSIS — R262 Difficulty in walking, not elsewhere classified: Secondary | ICD-10-CM | POA: Diagnosis not present

## 2021-10-19 DIAGNOSIS — R2689 Other abnormalities of gait and mobility: Secondary | ICD-10-CM | POA: Diagnosis not present

## 2021-10-19 DIAGNOSIS — R29898 Other symptoms and signs involving the musculoskeletal system: Secondary | ICD-10-CM | POA: Diagnosis not present

## 2021-10-20 ENCOUNTER — Encounter: Payer: Self-pay | Admitting: Nurse Practitioner

## 2021-10-20 ENCOUNTER — Non-Acute Institutional Stay (SKILLED_NURSING_FACILITY): Payer: Medicare Other | Admitting: Nurse Practitioner

## 2021-10-20 DIAGNOSIS — N183 Chronic kidney disease, stage 3 unspecified: Secondary | ICD-10-CM

## 2021-10-20 DIAGNOSIS — R609 Edema, unspecified: Secondary | ICD-10-CM | POA: Diagnosis not present

## 2021-10-20 DIAGNOSIS — I639 Cerebral infarction, unspecified: Secondary | ICD-10-CM

## 2021-10-20 DIAGNOSIS — I10 Essential (primary) hypertension: Secondary | ICD-10-CM

## 2021-10-20 DIAGNOSIS — F5105 Insomnia due to other mental disorder: Secondary | ICD-10-CM | POA: Diagnosis not present

## 2021-10-20 DIAGNOSIS — M159 Polyosteoarthritis, unspecified: Secondary | ICD-10-CM | POA: Diagnosis not present

## 2021-10-20 DIAGNOSIS — I480 Paroxysmal atrial fibrillation: Secondary | ICD-10-CM

## 2021-10-20 DIAGNOSIS — R413 Other amnesia: Secondary | ICD-10-CM | POA: Diagnosis not present

## 2021-10-20 DIAGNOSIS — R29898 Other symptoms and signs involving the musculoskeletal system: Secondary | ICD-10-CM | POA: Diagnosis not present

## 2021-10-20 DIAGNOSIS — R2689 Other abnormalities of gait and mobility: Secondary | ICD-10-CM | POA: Diagnosis not present

## 2021-10-20 DIAGNOSIS — K219 Gastro-esophageal reflux disease without esophagitis: Secondary | ICD-10-CM

## 2021-10-20 DIAGNOSIS — M6281 Muscle weakness (generalized): Secondary | ICD-10-CM | POA: Diagnosis not present

## 2021-10-20 DIAGNOSIS — I25709 Atherosclerosis of coronary artery bypass graft(s), unspecified, with unspecified angina pectoris: Secondary | ICD-10-CM

## 2021-10-20 DIAGNOSIS — K5901 Slow transit constipation: Secondary | ICD-10-CM

## 2021-10-20 DIAGNOSIS — F418 Other specified anxiety disorders: Secondary | ICD-10-CM

## 2021-10-20 DIAGNOSIS — R262 Difficulty in walking, not elsewhere classified: Secondary | ICD-10-CM | POA: Diagnosis not present

## 2021-10-20 DIAGNOSIS — G459 Transient cerebral ischemic attack, unspecified: Secondary | ICD-10-CM

## 2021-10-20 MED ORDER — ESCITALOPRAM OXALATE 10 MG PO TABS: 10.0000 mg | ORAL_TABLET | Freq: Every day | ORAL | 3 refills | Status: DC

## 2021-10-20 NOTE — Assessment & Plan Note (Signed)
The left hip pain, travels down to the left knee, worsens at night, improved, 04/07/21. X-ray left hip/pelvis r/o acute process. OA, left knee s/p TKR, MR lumbar, severe spinal 11/13/20 spinal and foraminal stenosis with left sciatica, taking Tramadol, Tylenol, Methocarbamol.

## 2021-10-20 NOTE — Assessment & Plan Note (Signed)
stable, takes Omeprazole, Hgb 13.9 06/19/21 

## 2021-10-20 NOTE — Assessment & Plan Note (Signed)
MMSE 27/30, SNF for supportive care. Vit B12 324 07/03/21, on Vit B12 1000mcg qd.  

## 2021-10-20 NOTE — Assessment & Plan Note (Signed)
stable, on Senna, MiraLax.  

## 2021-10-20 NOTE — Assessment & Plan Note (Signed)
stable, Hx of CABG Cath 04/25/19, prn NTG, takes Atorvastatin. LDL 74 3/25/2

## 2021-10-20 NOTE — Assessment & Plan Note (Signed)
on CPAP, prn Lorazepam

## 2021-10-20 NOTE — Assessment & Plan Note (Signed)
stable, on Eliquis, Atorvastatin.  

## 2021-10-20 NOTE — Assessment & Plan Note (Signed)
Neurology 10/06/21 cerebral infarction,MRI chronic right cerebellar stroke, mild to moderate generalized atrophy, small vessel disease with multiple chronic lacunar infarcts, possible left sided stroke not seen on imaging,  numbness in tongue, R sided deficits,  transient vision disturbance of R eye.

## 2021-10-20 NOTE — Assessment & Plan Note (Signed)
mild, EF 60% 08/03/21, off Furosemide, Bun/creat 17/0.9 08/26/21, Bun/creat 13/0.84 10/21/21

## 2021-10-20 NOTE — Assessment & Plan Note (Signed)
Blood pressure is controlled, takes Losartan, Metoprolol.  

## 2021-10-20 NOTE — Progress Notes (Unsigned)
Location:   SNF [facility] Room Number: 80 Place of Service:  SNF (31) Provider: Lennie Odor Latonyia Lopata NP  Rosalynn Sergent X, NP  Patient Care Team: Duey Liller X, NP as PCP - General (Internal Medicine) Leonie Toyoko Silos, MD as PCP - Cardiology (Cardiology) Dayna Geurts X, NP as Nurse Practitioner (Internal Medicine) Lavonna Monarch, MD (Inactive) as Consulting Physician (Dermatology)  Extended Emergency Contact Information Primary Emergency Contact: Lilly Mobile Phone: [phone] Relation: Daughter Secondary Emergency Contact: Weeks,Tiffany Mobile Phone: [phone] Relation: Daughter Preferred language: English Interpreter needed? No  Code Status:  DNR Goals of care: Advanced Directive information    10/07/2021    8:38 AM  Advanced Directives  Does Patient Have a Medical Advance Directive? Yes  Type of Paramedic of [place];Out of facility DNR (pink MOST or yellow form)  Copy of [place] in Chart? Yes - validated most recent copy scanned in chart (See row information)  Pre-existing out of facility DNR order (yellow form or pink MOST form) Yellow form placed in chart (order not valid for inpatient use);Pink Most/Yellow Form available - Physician notified to receive inpatient order     Chief Complaint  Patient presents with  . Medical Management of Chronic Issues    HPI:  Pt is a 88 y.o. female seen today for medical management of chronic diseases.        TIA, stable, on Eliquis, Atorvastatin.  CVA, Neurology 10/06/21 cerebral infarction,MRI chronic right cerebellar stroke, mild to moderate generalized atrophy, small vessel disease with multiple chronic lacunar infarcts, possible left sided stroke not seen on imaging-saw 2 fingers in front of her upon my examination,  numbness in right side of tongue, R sided deficits,  transient vision disturbance of R eye.              The left hip pain, travels down to the left knee, worsens  at night, improved, 04/07/21. X-ray left hip/pelvis r/o acute process. OA, left knee s/p TKR, MR lumbar, severe spinal 11/13/20 spinal and foraminal stenosis with left sciatica, taking Tramadol, Tylenol, Methocarbamol.              Constipation, stable, on Senna, MiraLax.              Afib, takes Metoprolol, Eliquis, Amiodarone, f/u cardiology. TSH 1.39 07/03/21             HTN, takes Losartan, Metoprolol.              GERD, stable, takes Omeprazole, Hgb 13.9 06/19/21             CAD, stable, Hx of CABG Cath 04/25/19, prn NTG, takes Atorvastatin. LDL 74 3/25/2             CKD Bun/creat 13/0.84 10/21/21             Insomnia/OSA, on CPAP, prn Lorazepam             Depression, emotional/teary,  off Sertraline 2/2 confusion. Stable since Lexapro '10mg'$  qd about 2 weeks ago, pending BMP             Vascular cognitive impairment, MMSE 27/30, SNF for supportive care. Vit B12 324 07/03/21, on Vit B12 1040mg qd.              BLE edema, mild, EF 60% 08/03/21, off Furosemide, Bun/creat 13/0.84 10/21/21   Past Medical History:  Diagnosis Date  . Atherosclerosis of native coronary artery 2001   LAD & Cx  disease -- BMS PCI in 2001; CABG 2002.  PCI in 2006 (per-op TAH)  . Atrial fibrillation and flutter ([place]) 04/2019   New diagnosis-major complaint was chest tightness and dyspnea.  . Breast cancer ([place])   . Cervix dysplasia   . GERD (gastroesophageal reflux disease)   . Heart attack ([place]) 2002, 2006   Both events reportedly were perioperatively. She has no recollection. Initial PCI was related to angina symptoms (bilateral arm pain)  . High cholesterol   . Hypertension   . Lumbosacral spinal stenosis   . Osteoarthritis   . Skin cancer   . Sleep apnea    Uses CPAP faithfully  . Squamous cell carcinoma of skin 02/12/2020   in situ- left neck-anterior (CX35FU)   Past Surgical History:  Procedure Laterality Date  . ABDOMINAL HYSTERECTOMY    . bone density    . CARDIAC CATHETERIZATION  2001    Dr. Glade Lloyd  [facility]) 2 vessel CAD involving LAD-D1 and distal Cx-OM. Appearance of dilated tapered left main with no significant atherosclerosis on IVUS --  BMS PCI of pLAD ([facility] BMS 3.0 mm x 15 mm) & with PTCA of ostial D1, PTCA of dLAD.  BMS PCI pCx [facility] BMS 3.5 mm x 11 mm) - unable to reopen dCx-OM (probable distal Atheroembolic occlusion.  Normal LVEF.  Marland Kitchen COLONOSCOPY    . CORONARY ARTERY BYPASS GRAFT  2002   Unsure of details [facility])  . CORONARY STENT INTERVENTION  2001   ([place]) [facility] BMS PCI pLAD (3.0 mm x 15 mm) -PTCA of jailed D1 as well as distal LAD; pCx ([facility] BMS 3.5 mm x 11 mm) -unable to open distalCx-OM -thromboembolic occlusion  . DIAGNOSTIC MAMMOGRAM    . LEFT HEART CATH AND CORS/GRAFTS ANGIOGRAPHY N/A 04/25/2019   Procedure: LEFT HEART CATH AND CORS/GRAFTS ANGIOGRAPHY;  Surgeon: Troy Sine, MD;  Location: [facility] INVASIVE CV LAB;;; ost-prox LCx stent @ OM1 ostium =100% CTO, Ost-prox LAD stent ~30% ISR, Ost D1 CTO, prox RCA ~30%. SCG-OM2 patent with ostial stent 30%, LIMA-LAD patent; CTO of SVG-D1.   Marland Kitchen left knee replacement    . MASTECTOMY    . NM MYOVIEW LTD  12/2014; 2018   Cardiologist (Dr. Kellie Simmering.  [place], [place] ph# [phone]) -->LEXISCAN: Nonischemic, "normal "  . pap smear    . tonsilectomy    . TRANSTHORACIC ECHOCARDIOGRAM  12/2014   Echocardiogram 12/25/14- normal LVEF and systolic function, EF 35-57%, mild grade 1 diastolic dysfunction  . TRANSTHORACIC ECHOCARDIOGRAM  04/24/2019   EF 65 to 70%.  Moderate septal LVH.  GR 1 DD-elevated EDP.  Normal PAP.  Relatively normal valves.  No aortic stenosis.    Allergies  Allergen Reactions  . Codeine Nausea Only    Allergies as of 10/20/2021       Reactions   Codeine Nausea Only        Medication List        Accurate as of October 20, 2021 11:59 PM. If you have any questions, ask your nurse or doctor.          acetaminophen 500 MG tablet Commonly known as: TYLENOL Take 2 tablets  (1,000 mg total) by mouth 2 (two) times daily.   amiodarone 100 MG tablet Commonly known as: PACERONE Take 100 mg by mouth daily.   AMLACTIN EX thin layer topically; topical Once A Day - PRN Special Instructions: May apply topically once daily PRN for dry skin.   apixaban 5 MG  Tabs tablet Commonly known as: Eliquis Take 1 tablet (5 mg total) by mouth 2 (two) times daily.   atorvastatin 80 MG tablet Commonly known as: LIPITOR Take 1 tablet (80 mg total) by mouth daily.   cyanocobalamin 1000 MCG tablet Commonly known as: VITAMIN B12 Take 1,000 mcg by mouth daily.   docusate sodium 100 MG capsule Commonly known as: COLACE Take 1 capsule (100 mg total) by mouth daily.   escitalopram 10 MG tablet Commonly known as: Lexapro Take 1 tablet (10 mg total) by mouth daily. Started by: Requan Hardge X Gerturde Kuba, NP   fexofenadine 180 MG tablet Commonly known as: ALLEGRA Take 1 tablet (180 mg total) by mouth daily as needed for allergies or rhinitis.   fluticasone 50 MCG/ACT nasal spray Commonly known as: FLONASE Place 1 spray into both nostrils daily as needed for allergies or rhinitis.   losartan 25 MG tablet Commonly known as: COZAAR Take 25 mg by mouth daily.   melatonin 5 MG Tabs Take 5 mg by mouth at bedtime.   methocarbamol 500 MG tablet Commonly known as: ROBAXIN Take 250 mg by mouth as needed.   metoprolol succinate 50 MG 24 hr tablet Commonly known as: TOPROL-XL Take 1 tablet (50 mg total) by mouth daily. Take with or immediately following a meal.   nitroGLYCERIN 0.4 MG SL tablet Commonly known as: NITROSTAT Place 0.4 mg under the tongue as needed for chest pain (Every 5 minutes x 3 doses).   nystatin powder Commonly known as: MYCOSTATIN/NYSTOP Apply 1 Application topically 2 (two) times daily.   omeprazole 20 MG tablet Commonly known as: PRILOSEC OTC Take 1 tablet (20 mg total) by mouth daily as needed.   polyethylene glycol 17 g packet Commonly known as: MIRALAX /  GLYCOLAX Take 17 g by mouth daily. Give with 4 oz of water.   Salonpas Pain Relieving 4 % Generic drug: lidocaine Place 1 patch onto the skin daily.   senna-docusate 8.6-50 MG tablet Commonly known as: Senokot-S Take 2 tablets by mouth at bedtime.   traMADol 50 MG tablet Commonly known as: Ultram Take 1 tablet (50 mg total) by mouth every 6 (six) hours as needed.   traMADol 50 MG tablet Commonly known as: ULTRAM Take 0.5 tablets (25 mg total) by mouth at bedtime.   Vitamin D3 50 MCG (2000 UT) Tabs Take 2,000 Units by mouth daily after breakfast.   zinc oxide 20 % ointment Apply 1 application topically 3 (three) times daily as needed for irritation. Apply to buttocks after every incontinent episode and as needed for redness.        Review of Systems  Constitutional:  Negative for activity change, appetite change and fever.  HENT:  Negative for congestion, hearing loss and voice change.   Eyes:  Negative for visual disturbance.  Respiratory:  Negative for chest tightness and shortness of breath.   Cardiovascular:  Positive for leg swelling.  Gastrointestinal:  Negative for abdominal pain and constipation.  Genitourinary:  Negative for dysuria and urgency.  Musculoskeletal:  Positive for arthralgias, back pain and gait problem.       S/p L TKR,  left hip pain is better  Skin:  Negative for color change.          Neurological:  Negative for speech difficulty and headaches.       Memory lapses. C/o left eye not seeing well upon first awake in am. C/o numbness right side of tongue.   Hematological:  Does not bruise/bleed easily.  Psychiatric/Behavioral:  Positive for sleep disturbance. Negative for behavioral problems and dysphoric mood. The patient is nervous/anxious.        Better    Immunization History  Administered Date(s) Administered  . Fluad Quad(high Dose 65+) 10/28/2020  . Influenza, High Dose Seasonal PF 10/21/2016, 09/24/2017, 10/25/2019  .  Influenza-Unspecified 10/03/2011, 10/04/2012, 01/13/2016  . Moderna SARS-COV2 Booster Vaccination 11/21/2019, 06/11/2020, 05/30/2021  . Moderna Sars-Covid-2 Vaccination 01/16/2019, 02/13/2019, 03/13/2019  . PNEUMOCOCCAL CONJUGATE-20 07/03/2021  . Pneumococcal-Unspecified 01/13/2016  . Tetanus 12/01/2017  . Zoster Recombinat (Shingrix) 08/24/2018, 02/22/2020   Pertinent  Health Maintenance Due  Topic Date Due  . HEMOGLOBIN A1C  12/01/2019  . OPHTHALMOLOGY EXAM  05/30/2021  . INFLUENZA VACCINE  08/12/2021  . FOOT EXAM  10/17/2021  . DEXA SCAN  Completed      09/12/2020    2:57 PM 10/17/2020    2:53 PM 02/07/2021   12:47 PM 06/19/2021   12:11 AM 09/11/2021    3:39 PM  Fall Risk  Falls in the past year? 0 0 1  0  Was there an injury with Fall? 0 0 0  0  Fall Risk Category Calculator 0 0 1  0  Fall Risk Category Low Low Low  Low  Patient Fall Risk Level Low fall risk Low fall risk Moderate fall risk Moderate fall risk Low fall risk  Patient at Risk for Falls Due to No Fall Risks No Fall Risks History of fall(s);Impaired balance/gait;Impaired mobility;Orthopedic patient  No Fall Risks  Patient at Risk for Falls Due to - Comments   spinal stenosis of lumbar    Fall risk Follow up Falls evaluation completed Falls evaluation completed Falls evaluation completed;Education provided;Falls prevention discussed  Falls evaluation completed   Functional Status Survey:    Vitals:   10/20/21 1421  BP: 116/63  Pulse: 78  Resp: 17  Temp: 98.2 F (36.8 C)  SpO2: 96%   There is no height or weight on file to calculate BMI. Physical Exam Constitutional:      Appearance: Normal appearance.  HENT:     Head: Normocephalic and atraumatic.     Nose: Nose normal.     Mouth/Throat:     Mouth: Mucous membranes are moist.  Eyes:     Extraocular Movements: Extraocular movements intact.     Pupils: Pupils are equal, round, and reactive to light.  Cardiovascular:     Rate and Rhythm: Normal rate and  regular rhythm.     Heart sounds: Murmur heard.  Pulmonary:     Effort: Pulmonary effort is normal.     Breath sounds: Normal breath sounds. No rales.  Abdominal:     General: Bowel sounds are normal.     Palpations: Abdomen is soft.     Tenderness: There is no abdominal tenderness.  Musculoskeletal:     Cervical back: Normal range of motion and neck supple.     Right lower leg: Edema present.     Left lower leg: Edema present.     Comments: Trace edema BLE. Slightly R arm weakness.   Skin:    General: Skin is warm and dry.     Comments: From previous exam: BLE cool to touch, dependent rubor noted. Mild venous insufficiency skin changes above ankles.  S/p right mastectomy.   Neurological:     General: No focal deficit present.     Mental Status: She is alert and oriented to person, place, and time. Mental status is at baseline.  Motor: Weakness present.     Coordination: Coordination normal.     Gait: Gait abnormal.     Comments: C/o numbness of the right side of tongue. Saw 2 fingers in front of her upon my examination left eye.   Psychiatric:        Mood and Affect: Mood normal.        Behavior: Behavior normal.        Thought Content: Thought content normal.    Labs reviewed: Recent Labs    04/30/21 0000 06/19/21 0006 06/19/21 0010 08/26/21 0000  NA 140 140 141 141  K 4.2 4.1 4.0 4.1  CL 103 104 103 103  CO2 26* 28  --  29*  GLUCOSE  --  108* 106*  --   BUN '16 14 17 17  '$ CREATININE 0.9 0.85 0.80 0.9  CALCIUM 9.5 9.8  --  10.0   Recent Labs    12/02/20 0000 03/26/21 0000  AST 17 15  ALT 15 12  ALKPHOS 43 47  ALBUMIN 3.6 3.8   Recent Labs    12/02/20 0000 03/26/21 0000 05/01/21 0000 06/19/21 0006 06/19/21 0010  WBC 6.0 6.5 6.9 7.2  --   NEUTROABS 10.70 3,627.00  --  3.5  --   HGB 12.4 11.9* 11.7* 13.2 13.9  HCT 37 35* 35* 41.8 41.0  MCV  --   --   --  105.0*  --   PLT 248 226 221 216  --    Lab Results  Component Value Date   TSH 1.390  07/03/2021   Lab Results  Component Value Date   HGBA1C 5.9 (H) 05/31/2019   Lab Results  Component Value Date   CHOL 175 07/03/2021   HDL 59 07/03/2021   LDLCALC 91 07/03/2021   TRIG 146 07/03/2021   CHOLHDL 2.4 04/05/2020    Significant Diagnostic Results in last 30 days:  No results found.  Assessment/Plan  TIA (transient ischemic attack) stable, on Eliquis, Atorvastatin.   CVA (cerebral vascular accident) [facility]) Neurology 10/06/21 cerebral infarction,MRI chronic right cerebellar stroke, mild to moderate generalized atrophy, small vessel disease with multiple chronic lacunar infarcts, possible left sided stroke not seen on imaging,  numbness in tongue, R sided deficits,  transient vision disturbance of R eye.   Osteoarthritis of multiple joints    The left hip pain, travels down to the left knee, worsens at night, improved, 04/07/21. X-ray left hip/pelvis r/o acute process. OA, left knee s/p TKR, MR lumbar, severe spinal 11/13/20 spinal and foraminal stenosis with left sciatica, taking Tramadol, Tylenol, Methocarbamol.   Constipation  stable, on Senna, MiraLax.   Paroxysmal atrial fibrillation/flutter (HCC) Heart rate is in control, takes Metoprolol, Eliquis, Amiodarone, f/u cardiology. TSH 1.39 07/03/21  Essential hypertension Blood pressure is controlled,  takes Losartan, Metoprolol.   Gastroesophageal reflux disease stable, takes Omeprazole, Hgb 13.9 06/19/21  Coronary artery disease involving coronary bypass graft of native heart with angina pectoris (HCC) stable, Hx of CABG Cath 04/25/19, prn NTG, takes Atorvastatin. LDL 74 3/25/2  CKD (chronic kidney disease) stage 3, GFR 30-59 ml/min (HCC) Bun/creat 17/0.9 08/26/21  Insomnia secondary to depression with anxiety on CPAP, prn Lorazepam  Mild memory disturbance MMSE 27/30, SNF for supportive care. Vit B12 324 07/03/21, on Vit B12 1069mg qd.   Edema mild, EF 60% 08/03/21, off Furosemide, Bun/creat 17/0.9 08/26/21,  Bun/creat 13/0.84 10/21/21   Family/ staff Communication: plan of care reviewed with the patient and charge nurse.   Labs/tests ordered:  none  Time spend 35 minutes.

## 2021-10-20 NOTE — Assessment & Plan Note (Signed)
Heart rate is in control, takes Metoprolol, Eliquis, Amiodarone, f/u cardiology. TSH 1.39 07/03/21 

## 2021-10-20 NOTE — Assessment & Plan Note (Signed)
Bun/creat 17/0.9 08/26/21

## 2021-10-21 DIAGNOSIS — I129 Hypertensive chronic kidney disease with stage 1 through stage 4 chronic kidney disease, or unspecified chronic kidney disease: Secondary | ICD-10-CM | POA: Diagnosis not present

## 2021-10-21 LAB — BASIC METABOLIC PANEL
BUN: 13 (ref 4–21)
CO2: 30 — AB (ref 13–22)
Chloride: 102 (ref 99–108)
Creatinine: 0.8 (ref 0.5–1.1)
Glucose: 89
Potassium: 4.3 mEq/L (ref 3.5–5.1)
Sodium: 140 (ref 137–147)

## 2021-10-21 LAB — COMPREHENSIVE METABOLIC PANEL
Calcium: 10 (ref 8.7–10.7)
eGFR: 68

## 2021-10-22 ENCOUNTER — Other Ambulatory Visit: Payer: Self-pay | Admitting: Adult Health

## 2021-10-22 DIAGNOSIS — R29898 Other symptoms and signs involving the musculoskeletal system: Secondary | ICD-10-CM | POA: Diagnosis not present

## 2021-10-22 DIAGNOSIS — R262 Difficulty in walking, not elsewhere classified: Secondary | ICD-10-CM | POA: Diagnosis not present

## 2021-10-22 DIAGNOSIS — M6281 Muscle weakness (generalized): Secondary | ICD-10-CM | POA: Diagnosis not present

## 2021-10-22 DIAGNOSIS — R2689 Other abnormalities of gait and mobility: Secondary | ICD-10-CM | POA: Diagnosis not present

## 2021-10-22 MED ORDER — TRAMADOL HCL 50 MG PO TABS: 25.0000 mg | ORAL_TABLET | Freq: Every day | ORAL | 0 refills | Status: DC

## 2021-10-23 DIAGNOSIS — R29898 Other symptoms and signs involving the musculoskeletal system: Secondary | ICD-10-CM | POA: Diagnosis not present

## 2021-10-23 DIAGNOSIS — M6281 Muscle weakness (generalized): Secondary | ICD-10-CM | POA: Diagnosis not present

## 2021-10-23 DIAGNOSIS — R2689 Other abnormalities of gait and mobility: Secondary | ICD-10-CM | POA: Diagnosis not present

## 2021-10-23 DIAGNOSIS — R262 Difficulty in walking, not elsewhere classified: Secondary | ICD-10-CM | POA: Diagnosis not present

## 2021-10-24 DIAGNOSIS — R29898 Other symptoms and signs involving the musculoskeletal system: Secondary | ICD-10-CM | POA: Diagnosis not present

## 2021-10-24 DIAGNOSIS — R262 Difficulty in walking, not elsewhere classified: Secondary | ICD-10-CM | POA: Diagnosis not present

## 2021-10-24 DIAGNOSIS — M6281 Muscle weakness (generalized): Secondary | ICD-10-CM | POA: Diagnosis not present

## 2021-10-24 DIAGNOSIS — R2689 Other abnormalities of gait and mobility: Secondary | ICD-10-CM | POA: Diagnosis not present

## 2021-10-25 DIAGNOSIS — R29898 Other symptoms and signs involving the musculoskeletal system: Secondary | ICD-10-CM | POA: Diagnosis not present

## 2021-10-25 DIAGNOSIS — R2689 Other abnormalities of gait and mobility: Secondary | ICD-10-CM | POA: Diagnosis not present

## 2021-10-25 DIAGNOSIS — R262 Difficulty in walking, not elsewhere classified: Secondary | ICD-10-CM | POA: Diagnosis not present

## 2021-10-25 DIAGNOSIS — M6281 Muscle weakness (generalized): Secondary | ICD-10-CM | POA: Diagnosis not present

## 2021-10-28 DIAGNOSIS — R2689 Other abnormalities of gait and mobility: Secondary | ICD-10-CM | POA: Diagnosis not present

## 2021-10-28 DIAGNOSIS — R262 Difficulty in walking, not elsewhere classified: Secondary | ICD-10-CM | POA: Diagnosis not present

## 2021-10-28 DIAGNOSIS — M6281 Muscle weakness (generalized): Secondary | ICD-10-CM | POA: Diagnosis not present

## 2021-10-28 DIAGNOSIS — R29898 Other symptoms and signs involving the musculoskeletal system: Secondary | ICD-10-CM | POA: Diagnosis not present

## 2021-10-29 DIAGNOSIS — M6281 Muscle weakness (generalized): Secondary | ICD-10-CM | POA: Diagnosis not present

## 2021-10-29 DIAGNOSIS — R2689 Other abnormalities of gait and mobility: Secondary | ICD-10-CM | POA: Diagnosis not present

## 2021-10-29 DIAGNOSIS — R29898 Other symptoms and signs involving the musculoskeletal system: Secondary | ICD-10-CM | POA: Diagnosis not present

## 2021-10-29 DIAGNOSIS — R262 Difficulty in walking, not elsewhere classified: Secondary | ICD-10-CM | POA: Diagnosis not present

## 2021-10-30 DIAGNOSIS — R2689 Other abnormalities of gait and mobility: Secondary | ICD-10-CM | POA: Diagnosis not present

## 2021-10-30 DIAGNOSIS — R262 Difficulty in walking, not elsewhere classified: Secondary | ICD-10-CM | POA: Diagnosis not present

## 2021-10-30 DIAGNOSIS — R29898 Other symptoms and signs involving the musculoskeletal system: Secondary | ICD-10-CM | POA: Diagnosis not present

## 2021-10-30 DIAGNOSIS — M6281 Muscle weakness (generalized): Secondary | ICD-10-CM | POA: Diagnosis not present

## 2021-10-31 DIAGNOSIS — R29898 Other symptoms and signs involving the musculoskeletal system: Secondary | ICD-10-CM | POA: Diagnosis not present

## 2021-10-31 DIAGNOSIS — M6281 Muscle weakness (generalized): Secondary | ICD-10-CM | POA: Diagnosis not present

## 2021-10-31 DIAGNOSIS — R262 Difficulty in walking, not elsewhere classified: Secondary | ICD-10-CM | POA: Diagnosis not present

## 2021-10-31 DIAGNOSIS — R2689 Other abnormalities of gait and mobility: Secondary | ICD-10-CM | POA: Diagnosis not present

## 2021-11-03 DIAGNOSIS — R262 Difficulty in walking, not elsewhere classified: Secondary | ICD-10-CM | POA: Diagnosis not present

## 2021-11-03 DIAGNOSIS — M6281 Muscle weakness (generalized): Secondary | ICD-10-CM | POA: Diagnosis not present

## 2021-11-03 DIAGNOSIS — R29898 Other symptoms and signs involving the musculoskeletal system: Secondary | ICD-10-CM | POA: Diagnosis not present

## 2021-11-03 DIAGNOSIS — R2689 Other abnormalities of gait and mobility: Secondary | ICD-10-CM | POA: Diagnosis not present

## 2021-11-04 DIAGNOSIS — M6281 Muscle weakness (generalized): Secondary | ICD-10-CM | POA: Diagnosis not present

## 2021-11-04 DIAGNOSIS — R2689 Other abnormalities of gait and mobility: Secondary | ICD-10-CM | POA: Diagnosis not present

## 2021-11-04 DIAGNOSIS — R262 Difficulty in walking, not elsewhere classified: Secondary | ICD-10-CM | POA: Diagnosis not present

## 2021-11-04 DIAGNOSIS — R29898 Other symptoms and signs involving the musculoskeletal system: Secondary | ICD-10-CM | POA: Diagnosis not present

## 2021-11-05 DIAGNOSIS — R29898 Other symptoms and signs involving the musculoskeletal system: Secondary | ICD-10-CM | POA: Diagnosis not present

## 2021-11-05 DIAGNOSIS — R262 Difficulty in walking, not elsewhere classified: Secondary | ICD-10-CM | POA: Diagnosis not present

## 2021-11-05 DIAGNOSIS — M6281 Muscle weakness (generalized): Secondary | ICD-10-CM | POA: Diagnosis not present

## 2021-11-05 DIAGNOSIS — R2689 Other abnormalities of gait and mobility: Secondary | ICD-10-CM | POA: Diagnosis not present

## 2021-11-07 DIAGNOSIS — M6281 Muscle weakness (generalized): Secondary | ICD-10-CM | POA: Diagnosis not present

## 2021-11-07 DIAGNOSIS — R262 Difficulty in walking, not elsewhere classified: Secondary | ICD-10-CM | POA: Diagnosis not present

## 2021-11-07 DIAGNOSIS — R29898 Other symptoms and signs involving the musculoskeletal system: Secondary | ICD-10-CM | POA: Diagnosis not present

## 2021-11-07 DIAGNOSIS — R2689 Other abnormalities of gait and mobility: Secondary | ICD-10-CM | POA: Diagnosis not present

## 2021-11-13 DIAGNOSIS — Z23 Encounter for immunization: Secondary | ICD-10-CM | POA: Diagnosis not present

## 2021-11-14 ENCOUNTER — Non-Acute Institutional Stay (SKILLED_NURSING_FACILITY): Payer: Medicare Other | Admitting: Nurse Practitioner

## 2021-11-14 ENCOUNTER — Encounter: Payer: Self-pay | Admitting: Nurse Practitioner

## 2021-11-14 DIAGNOSIS — F418 Other specified anxiety disorders: Secondary | ICD-10-CM | POA: Diagnosis not present

## 2021-11-14 DIAGNOSIS — R413 Other amnesia: Secondary | ICD-10-CM

## 2021-11-14 DIAGNOSIS — K5901 Slow transit constipation: Secondary | ICD-10-CM

## 2021-11-14 DIAGNOSIS — I480 Paroxysmal atrial fibrillation: Secondary | ICD-10-CM

## 2021-11-14 DIAGNOSIS — N183 Chronic kidney disease, stage 3 unspecified: Secondary | ICD-10-CM

## 2021-11-14 DIAGNOSIS — I25119 Atherosclerotic heart disease of native coronary artery with unspecified angina pectoris: Secondary | ICD-10-CM

## 2021-11-14 DIAGNOSIS — K219 Gastro-esophageal reflux disease without esophagitis: Secondary | ICD-10-CM

## 2021-11-14 DIAGNOSIS — M67911 Unspecified disorder of synovium and tendon, right shoulder: Secondary | ICD-10-CM | POA: Diagnosis not present

## 2021-11-14 DIAGNOSIS — R609 Edema, unspecified: Secondary | ICD-10-CM

## 2021-11-14 DIAGNOSIS — I10 Essential (primary) hypertension: Secondary | ICD-10-CM

## 2021-11-14 DIAGNOSIS — F5105 Insomnia due to other mental disorder: Secondary | ICD-10-CM

## 2021-11-14 DIAGNOSIS — M159 Polyosteoarthritis, unspecified: Secondary | ICD-10-CM

## 2021-11-14 DIAGNOSIS — M19011 Primary osteoarthritis, right shoulder: Secondary | ICD-10-CM | POA: Diagnosis not present

## 2021-11-14 DIAGNOSIS — I639 Cerebral infarction, unspecified: Secondary | ICD-10-CM | POA: Diagnosis not present

## 2021-11-14 NOTE — Assessment & Plan Note (Addendum)
Heart rate is in control, takes Metoprolol, Eliquis, Amiodarone, f/u cardiology. TSH 1.39 07/03/21

## 2021-11-14 NOTE — Assessment & Plan Note (Signed)
stable, takes Omeprazole, Hgb 13.9 06/19/21

## 2021-11-14 NOTE — Assessment & Plan Note (Signed)
stable, Hx of CABG Cath 04/25/19, prn NTG, takes Atorvastatin. LDL 74 04/05/21

## 2021-11-14 NOTE — Assessment & Plan Note (Signed)
The patient c/o not sleeping well at night, prn use Lorazepam in the past, may try Lorazepam 0.'25mg'$  qhs, continue Lexapro.

## 2021-11-14 NOTE — Assessment & Plan Note (Signed)
left knee s/p TKR, right shoulder pain, MR lumbar, severe spinal 11/13/20 spinal and foraminal stenosis with left sciatica, taking Tramadol, Tylenol, Methocarbamol.

## 2021-11-14 NOTE — Assessment & Plan Note (Signed)
stable, on Senna, MiraLax.

## 2021-11-14 NOTE — Progress Notes (Signed)
Location:   SNF [place] Room Number: 74 Place of Service:  SNF (31) Provider: Lennie Odor Kal Chait NP  Jack Mineau X, NP  Patient Care Team: Shenica Holzheimer X, NP as PCP - General (Internal Medicine) Leonie Arnez Stoneking, MD as PCP - Cardiology (Cardiology) Taffy Delconte X, NP as Nurse Practitioner (Internal Medicine) Lavonna Monarch, MD (Inactive) as Consulting Physician (Dermatology)  Extended Emergency Contact Information Primary Emergency Contact: Lewis Mobile Phone: [phone] Relation: Daughter Secondary Emergency Contact: Weeks,Tiffany Mobile Phone: [phone] Relation: Daughter Preferred language: English Interpreter needed? No  Code Status:  DNR Goals of care: Advanced Directive information    10/07/2021    8:38 AM  Advanced Directives  Does Patient Have a Medical Advance Directive? Yes  Type of Paramedic of [place];Out of facility DNR (pink MOST or yellow form)  Copy of [place] in Chart? Yes - validated most recent copy scanned in chart (See row information)  Pre-existing out of facility DNR order (yellow form or pink MOST form) Yellow form placed in chart (order not valid for inpatient use);Pink Most/Yellow Form available - Physician notified to receive inpatient order     Chief Complaint  Patient presents with   Medical Management of Chronic Issues    HPI:  Pt is a 88 y.o. female seen today for medical management of chronic diseases.     TIA, stable, on Eliquis, Atorvastatin.  CVA, Neurology: cerebral infarction,MRI chronic right cerebellar stroke, mild to moderate generalized atrophy, small vessel disease with multiple chronic lacunar infarcts, possible left sided stroke not seen on imaging-saw 2 fingers in front of her upon my examination,  numbness in right side of tongue, R sided deficits,  transient vision disturbance of R eye.  OA, left knee s/p TKR, MR lumbar, severe spinal 11/13/20 spinal and foraminal  stenosis with left sciatica, taking Tramadol, Tylenol, Methocarbamol.              Constipation, stable, on Senna, MiraLax.              Afib, takes Metoprolol, Eliquis, Amiodarone, f/u cardiology. TSH 1.39 07/03/21             HTN, takes Losartan, Metoprolol.              GERD, stable, takes Omeprazole, Hgb 13.9 06/19/21             CAD, stable, Hx of CABG Cath 04/25/19, prn NTG, takes Atorvastatin. LDL 74 04/05/21             CKD Bun/creat 13/0.84 10/21/21             Insomnia/OSA, on CPAP, not sleeping well.              Depression, emotional/teary,  off Sertraline 2/2 confusion. Stable since Lexapro '10mg'$  qd             Vascular cognitive impairment, MMSE 27/30, SNF for supportive care. Vit B12 324 07/03/21, on Vit B12 1043mg qd.              BLE edema, mild, EF 60% 08/03/21, off Furosemide, Bun/creat 13/0.84 10/21/21   Past Medical History:  Diagnosis Date   Atherosclerosis of native coronary artery 2001   LAD & Cx disease -- BMS PCI in 2001; CABG 2002.  PCI in 2006 (per-op [facility])   Atrial fibrillation and flutter ([facility] 04/2019   New diagnosis-major complaint was chest tightness and dyspnea.   [place]    Cervix  dysplasia    GERD (gastroesophageal reflux disease)    Heart attack ([place]) 2002, 2006   Both events reportedly were perioperatively. She has no recollection. Initial PCI was related to angina symptoms (bilateral arm pain)   High cholesterol    Hypertension    Lumbosacral spinal stenosis    Osteoarthritis    Skin cancer    Sleep apnea    Uses CPAP faithfully   Squamous cell carcinoma of skin 02/12/2020   in situ- left neck-anterior (CX35FU)   Past Surgical History:  Procedure Laterality Date   ABDOMINAL HYSTERECTOMY     bone density     CARDIAC CATHETERIZATION  2001    Dr. Glade Lloyd [facility]) 2 vessel CAD involving LAD-D1 and distal Cx-OM. Appearance of dilated tapered left main with no significant atherosclerosis on IVUS --  BMS PCI of pLAD ([place] BMS 3.0 mm x  15 mm) & with PTCA of ostial D1, PTCA of dLAD.  BMS PCI pCx [facility] BMS 3.5 mm x 11 mm) - unable to reopen dCx-OM (probable distal Atheroembolic occlusion.  Normal LVEF.   COLONOSCOPY     CORONARY ARTERY BYPASS GRAFT  2002   Unsure of details [facility], in [facility])   [place]  2001   ([place]) [place] BMS PCI pLAD (3.0 mm x 15 mm) -PTCA of jailed D1 as well as distal LAD; pCx ([place] BMS 3.5 mm x 11 mm) -unable to open distalCx-OM -thromboembolic occlusion   DIAGNOSTIC MAMMOGRAM     LEFT HEART CATH AND CORS/GRAFTS ANGIOGRAPHY N/A 04/25/2019   Procedure: LEFT HEART CATH AND CORS/GRAFTS ANGIOGRAPHY;  Surgeon: Troy Sine, MD;  Location: [facility] INV[facility] CV LAB;;; ost-prox LCx stent @ OM1 ostium =100% CTO, Ost-prox LAD stent ~30% ISR, Ost D1 CTO, prox RCA ~30%. SCG-OM2 patent with ostial stent 30%, LIMA-LAD patent; CTO of SVG-D1.    left knee replacement     MASTECTOMY     NM MYOVIEW LTD  12/2014; 2018   Cardiologist (Dr. Kellie Simmering.  [place], [place] ph# [phone]) -->LEXISCAN: Nonischemic, "normal "   pap smear     tonsilectomy     TRANSTHORACIC ECHOCARDIOGRAM  12/2014   Echocardiogram 12/25/14- normal LVEF and systolic function, EF 51-76%, mild grade 1 diastolic dysfunction   TRANSTHORACIC ECHOCARDIOGRAM  04/24/2019   EF 65 to 70%.  Moderate septal LVH.  GR 1 DD-elevated EDP.  Normal PAP.  Relatively normal valves.  No aortic stenosis.    Allergies  Allergen Reactions   Codeine Nausea Only    Allergies as of 11/14/2021       Reactions   Codeine Nausea Only        Medication List        Accurate as of November 14, 2021  3:55 PM. If you have any questions, ask your nurse or doctor.          acetaminophen 500 MG tablet Commonly known as: TYLENOL Take 2 tablets (1,000 mg total) by mouth 2 (two) times daily.   amiodarone 100 MG tablet Commonly known as: PACERONE Take 100 mg by mouth daily.   AMLACTIN EX thin layer topically; topical Once A Day -  PRN Special Instructions: May apply topically once daily PRN for dry skin.   apixaban 5 MG Tabs tablet Commonly known as: Eliquis Take 1 tablet (5 mg total) by mouth 2 (two) times daily.   atorvastatin 80 MG tablet Commonly known as: LIPITOR Take 1 tablet (80 mg total) by mouth daily.   cyanocobalamin  1000 MCG tablet Commonly known as: VITAMIN B12 Take 1,000 mcg by mouth daily.   docusate sodium 100 MG capsule Commonly known as: COLACE Take 1 capsule (100 mg total) by mouth daily.   escitalopram 10 MG tablet Commonly known as: Lexapro Take 1 tablet (10 mg total) by mouth daily.   fexofenadine 180 MG tablet Commonly known as: ALLEGRA Take 1 tablet (180 mg total) by mouth daily as needed for allergies or rhinitis.   fluticasone 50 MCG/ACT nasal spray Commonly known as: FLONASE Place 1 spray into both nostrils daily as needed for allergies or rhinitis.   losartan 25 MG tablet Commonly known as: COZAAR Take 25 mg by mouth daily.   melatonin 5 MG Tabs Take 5 mg by mouth at bedtime.   methocarbamol 500 MG tablet Commonly known as: ROBAXIN Take 250 mg by mouth as needed.   metoprolol succinate 50 MG 24 hr tablet Commonly known as: TOPROL-XL Take 1 tablet (50 mg total) by mouth daily. Take with or immediately following a meal.   nitroGLYCERIN 0.4 MG SL tablet Commonly known as: NITROSTAT Place 0.4 mg under the tongue as needed for chest pain (Every 5 minutes x 3 doses).   nystatin powder Commonly known as: MYCOSTATIN/NYSTOP Apply 1 Application topically 2 (two) times daily.   omeprazole 20 MG tablet Commonly known as: PRILOSEC OTC Take 1 tablet (20 mg total) by mouth daily as needed.   polyethylene glycol 17 g packet Commonly known as: MIRALAX / GLYCOLAX Take 17 g by mouth daily. Give with 4 oz of water.   Salonpas Pain Relieving 4 % Generic drug: lidocaine Place 1 patch onto the skin daily.   senna-docusate 8.6-50 MG tablet Commonly known as:  Senokot-S Take 2 tablets by mouth at bedtime.   traMADol 50 MG tablet Commonly known as: Ultram Take 1 tablet (50 mg total) by mouth every 6 (six) hours as needed.   traMADol 50 MG tablet Commonly known as: ULTRAM Take 0.5 tablets (25 mg total) by mouth at bedtime.   Vitamin D3 50 MCG (2000 UT) Tabs Take 2,000 Units by mouth daily after breakfast.   zinc oxide 20 % ointment Apply 1 application topically 3 (three) times daily as needed for irritation. Apply to buttocks after every incontinent episode and as needed for redness.        Review of Systems  Constitutional:  Negative for activity change, appetite change and fever.  HENT:  Negative for congestion, hearing loss and voice change.   Eyes:  Negative for visual disturbance.       C/o eye sight issue upon first awake in am, mostly R eye, better as day goes on.   Respiratory:  Negative for chest tightness and shortness of breath.   Cardiovascular:  Positive for leg swelling.  Gastrointestinal:  Negative for abdominal pain and constipation.  Genitourinary:  Negative for dysuria and urgency.  Musculoskeletal:  Positive for arthralgias, back pain and gait problem.       S/p L TKR,  left hip pain is better, right shoulder pain as well.   Skin:  Negative for color change.          Neurological:  Positive for numbness. Negative for speech difficulty and headaches.       Memory lapses. C/o numbness right side of tongue.   Hematological:  Does not bruise/bleed easily.  Psychiatric/Behavioral:  Positive for sleep disturbance. Negative for behavioral problems and dysphoric mood. The patient is nervous/anxious.  Better    Immunization History  Administered Date(s) Administered   Fluad Quad(high Dose 65+) 10/28/2020   Influenza, High Dose Seasonal PF 10/21/2016, 09/24/2017, 10/25/2019   Influenza-Unspecified 10/03/2011, 10/04/2012, 01/13/2016   Moderna SARS-COV2 Booster Vaccination 11/21/2019, 06/11/2020, 05/30/2021    Moderna Sars-Covid-2 Vaccination 01/16/2019, 02/13/2019, 03/13/2019   PNEUMOCOCCAL CONJUGATE-20 07/03/2021   Pneumococcal-Unspecified 01/13/2016   Tetanus 12/01/2017   Zoster Recombinat (Shingrix) 08/24/2018, 02/22/2020   Pertinent  Health Maintenance Due  Topic Date Due   HEMOGLOBIN A1C  12/01/2019   OPHTHALMOLOGY EXAM  05/30/2021   INFLUENZA VACCINE  08/12/2021   FOOT EXAM  10/17/2021   DEXA SCAN  Completed      09/12/2020    2:57 PM 10/17/2020    2:53 PM 02/07/2021   12:47 PM 06/19/2021   12:11 AM 09/11/2021    3:39 PM  Fall Risk  Falls in the past year? 0 0 1  0  Was there an injury with Fall? 0 0 0  0  Fall Risk Category Calculator 0 0 1  0  Fall Risk Category Low Low Low  Low  Patient Fall Risk Level Low fall risk Low fall risk Moderate fall risk Moderate fall risk Low fall risk  Patient at Risk for Falls Due to No Fall Risks No Fall Risks History of fall(s);Impaired balance/gait;Impaired mobility;Orthopedic patient  No Fall Risks  Patient at Risk for Falls Due to - Comments   spinal stenosis of lumbar    Fall risk Follow up Falls evaluation completed Falls evaluation completed Falls evaluation completed;Education provided;Falls prevention discussed  Falls evaluation completed   Functional Status Survey:    Vitals:   11/14/21 1527  BP: (!) 156/76  Pulse: 66  Resp: 17  Temp: 98.2 F (36.8 C)  SpO2: 94%  Weight: 187 lb 12.8 oz (85.2 kg)   Body mass index is 28.55 kg/m. Physical Exam Constitutional:      Appearance: Normal appearance.  HENT:     Head: Normocephalic and atraumatic.     Nose: Nose normal.     Mouth/Throat:     Mouth: Mucous membranes are moist.  Eyes:     Extraocular Movements: Extraocular movements intact.     Pupils: Pupils are equal, round, and reactive to light.  Cardiovascular:     Rate and Rhythm: Normal rate and regular rhythm.     Heart sounds: Murmur heard.  Pulmonary:     Effort: Pulmonary effort is normal.     Breath sounds: Normal  breath sounds. No rales.  Abdominal:     General: Bowel sounds are normal.     Palpations: Abdomen is soft.     Tenderness: There is no abdominal tenderness.  Musculoskeletal:     Cervical back: Normal range of motion and neck supple.     Right lower leg: Edema present.     Left lower leg: Edema present.     Comments: Trace edema BLE. Slightly R arm weakness. C/o right shoulder/arm aches. No decreased ROM  Skin:    General: Skin is warm and dry.     Comments: From previous exam: BLE cool to touch, dependent rubor noted. Mild venous insufficiency skin changes above ankles.  S/p right mastectomy.   Neurological:     General: No focal deficit present.     Mental Status: She is alert and oriented to person, place, and time. Mental status is at baseline.     Motor: Weakness present.     Coordination: Coordination normal.  Gait: Gait abnormal.     Comments: C/o numbness of the right side of tongue. Saw 2 fingers in front of her upon my examination left eye.   Psychiatric:        Mood and Affect: Mood normal.        Behavior: Behavior normal.        Thought Content: Thought content normal.     Labs reviewed: Recent Labs    04/30/21 0000 06/19/21 0006 06/19/21 0010 08/26/21 0000  NA 140 140 141 141  K 4.2 4.1 4.0 4.1  CL 103 104 103 103  CO2 26* 28  --  29*  GLUCOSE  --  108* 106*  --   BUN '16 14 17 17  '$ CREATININE 0.9 0.85 0.80 0.9  CALCIUM 9.5 9.8  --  10.0   Recent Labs    12/02/20 0000 03/26/21 0000  AST 17 15  ALT 15 12  ALKPHOS 43 47  ALBUMIN 3.6 3.8   Recent Labs    12/02/20 0000 03/26/21 0000 05/01/21 0000 06/19/21 0006 06/19/21 0010  WBC 6.0 6.5 6.9 7.2  --   NEUTROABS 10.70 3,627.00  --  3.5  --   HGB 12.4 11.9* 11.7* 13.2 13.9  HCT 37 35* 35* 41.8 41.0  MCV  --   --   --  105.0*  --   PLT 248 226 221 216  --    Lab Results  Component Value Date   TSH 1.390 07/03/2021   Lab Results  Component Value Date   HGBA1C 5.9 (H) 05/31/2019   Lab  Results  Component Value Date   CHOL 175 07/03/2021   HDL 59 07/03/2021   LDLCALC 91 07/03/2021   TRIG 146 07/03/2021   CHOLHDL 2.4 04/05/2020    Significant Diagnostic Results in last 30 days:  No results found.  Assessment/Plan  Insomnia secondary to depression with anxiety The patient c/o not sleeping well at night, prn use Lorazepam in the past, may try Lorazepam 0.'25mg'$  qhs, continue Lexapro.   Mild memory disturbance  SNF for supportive care. Vit B12 324 07/03/21, on Vit B12 1075mg qd.   Edema mild, EF 60% 08/03/21, off Furosemide, Bun/creat 13/0.84 10/21/21  CKD (chronic kidney disease) stage 3, GFR 30-59 ml/min (HCC) Bun/creat 13/0.84 10/21/21  Coronary artery disease involving native coronary artery of native heart with angina pectoris (HCC)  stable, Hx of CABG Cath 04/25/19, prn NTG, takes Atorvastatin. LDL 74 04/05/21  Gastroesophageal reflux disease stable, takes Omeprazole, Hgb 13.9 06/19/21  Paroxysmal atrial fibrillation/flutter (HCC) Heart rate is in control, takes Metoprolol, Eliquis, Amiodarone, f/u cardiology. TSH 1.39 07/03/21  Essential hypertension Blood pressure is controlled, takes Losartan, Metoprolol.   Constipation stable, on Senna, MiraLax.   Osteoarthritis of multiple joints left knee s/p TKR, right shoulder pain, MR lumbar, severe spinal 11/13/20 spinal and foraminal stenosis with left sciatica, taking Tramadol, Tylenol, Methocarbamol.   [place], stable, on Eliquis, Atorvastatin.  CVA, Neurology 10/06/21 cerebral infarction,MRI chronic right cerebellar stroke, mild to moderate generalized atrophy, small vessel disease with multiple chronic lacunar infarcts, possible left sided stroke not seen on imaging-saw 2 fingers in front of her upon my examination,  numbness in right side of tongue, R sided deficits,  transient vision disturbance of R eye.    Family/ staff Communication: plan of care reviewed with the  patient and charge nurse.   Labs/tests ordered:  none  Time spend 35 minutes.

## 2021-11-14 NOTE — Assessment & Plan Note (Signed)
Bun/creat 13/0.84 10/21/21

## 2021-11-14 NOTE — Assessment & Plan Note (Signed)
mild, EF 60% 08/03/21, off Furosemide, Bun/creat 13/0.84 10/21/21

## 2021-11-14 NOTE — Assessment & Plan Note (Signed)
Blood pressure is controlled, takes Losartan, Metoprolol.

## 2021-11-14 NOTE — Assessment & Plan Note (Signed)
SNF for supportive care. Vit B12 324 07/03/21, on Vit B12 1029mg qd.

## 2021-11-14 NOTE — Assessment & Plan Note (Signed)
TIA, stable, on Eliquis, Atorvastatin.  CVA, Neurology 10/06/21 cerebral infarction,MRI chronic right cerebellar stroke, mild to moderate generalized atrophy, small vessel disease with multiple chronic lacunar infarcts, possible left sided stroke not seen on imaging-saw 2 fingers in front of her upon my examination,  numbness in right side of tongue, R sided deficits,  transient vision disturbance of R eye.

## 2021-11-25 DIAGNOSIS — M25552 Pain in left hip: Secondary | ICD-10-CM | POA: Diagnosis not present

## 2021-11-25 DIAGNOSIS — W19XXXA Unspecified fall, initial encounter: Secondary | ICD-10-CM | POA: Diagnosis not present

## 2021-12-04 ENCOUNTER — Emergency Department (HOSPITAL_COMMUNITY)
Admission: EM | Admit: 2021-12-04 | Discharge: 2021-12-04 | Disposition: A | Discharge status: Skilled Nursing Facility | Payer: Medicare Other | Attending: Emergency Medicine | Admitting: Emergency Medicine

## 2021-12-04 ENCOUNTER — Encounter (HOSPITAL_COMMUNITY): Payer: Self-pay

## 2021-12-04 ENCOUNTER — Other Ambulatory Visit: Payer: Self-pay

## 2021-12-04 ENCOUNTER — Emergency Department (HOSPITAL_COMMUNITY): Payer: Medicare Other

## 2021-12-04 DIAGNOSIS — M25411 Effusion, right shoulder: Secondary | ICD-10-CM | POA: Diagnosis not present

## 2021-12-04 DIAGNOSIS — W19XXXA Unspecified fall, initial encounter: Secondary | ICD-10-CM | POA: Diagnosis not present

## 2021-12-04 DIAGNOSIS — G8190 Hemiplegia, unspecified affecting unspecified side: Secondary | ICD-10-CM | POA: Diagnosis not present

## 2021-12-04 DIAGNOSIS — K4090 Unilateral inguinal hernia, without obstruction or gangrene, not specified as recurrent: Secondary | ICD-10-CM | POA: Diagnosis not present

## 2021-12-04 DIAGNOSIS — M25512 Pain in left shoulder: Secondary | ICD-10-CM | POA: Diagnosis not present

## 2021-12-04 DIAGNOSIS — K8020 Calculus of gallbladder without cholecystitis without obstruction: Secondary | ICD-10-CM | POA: Diagnosis not present

## 2021-12-04 DIAGNOSIS — I10 Essential (primary) hypertension: Secondary | ICD-10-CM | POA: Diagnosis not present

## 2021-12-04 DIAGNOSIS — R739 Hyperglycemia, unspecified: Secondary | ICD-10-CM | POA: Insufficient documentation

## 2021-12-04 DIAGNOSIS — J42 Unspecified chronic bronchitis: Secondary | ICD-10-CM | POA: Diagnosis not present

## 2021-12-04 DIAGNOSIS — D7589 Other specified diseases of blood and blood-forming organs: Secondary | ICD-10-CM | POA: Insufficient documentation

## 2021-12-04 DIAGNOSIS — M19011 Primary osteoarthritis, right shoulder: Secondary | ICD-10-CM | POA: Diagnosis not present

## 2021-12-04 DIAGNOSIS — M25552 Pain in left hip: Secondary | ICD-10-CM | POA: Diagnosis not present

## 2021-12-04 DIAGNOSIS — M25519 Pain in unspecified shoulder: Secondary | ICD-10-CM | POA: Diagnosis not present

## 2021-12-04 DIAGNOSIS — N281 Cyst of kidney, acquired: Secondary | ICD-10-CM | POA: Diagnosis not present

## 2021-12-04 DIAGNOSIS — R1032 Left lower quadrant pain: Secondary | ICD-10-CM | POA: Insufficient documentation

## 2021-12-04 DIAGNOSIS — Z7401 Bed confinement status: Secondary | ICD-10-CM | POA: Diagnosis not present

## 2021-12-04 DIAGNOSIS — Z7901 Long term (current) use of anticoagulants: Secondary | ICD-10-CM | POA: Diagnosis not present

## 2021-12-04 DIAGNOSIS — Z79899 Other long term (current) drug therapy: Secondary | ICD-10-CM | POA: Diagnosis not present

## 2021-12-04 DIAGNOSIS — I959 Hypotension, unspecified: Secondary | ICD-10-CM | POA: Diagnosis not present

## 2021-12-04 DIAGNOSIS — G459 Transient cerebral ischemic attack, unspecified: Secondary | ICD-10-CM | POA: Diagnosis not present

## 2021-12-04 DIAGNOSIS — R7309 Other abnormal glucose: Secondary | ICD-10-CM

## 2021-12-04 DIAGNOSIS — M25511 Pain in right shoulder: Secondary | ICD-10-CM | POA: Insufficient documentation

## 2021-12-04 DIAGNOSIS — T68XXXA Hypothermia, initial encounter: Secondary | ICD-10-CM | POA: Diagnosis not present

## 2021-12-04 LAB — CBC WITH DIFFERENTIAL/PLATELET
Abs Immature Granulocytes: 0.02 10*3/uL (ref 0.00–0.07)
Basophils Absolute: 0 10*3/uL (ref 0.0–0.1)
Basophils Relative: 1 %
Eosinophils Absolute: 0.1 10*3/uL (ref 0.0–0.5)
Eosinophils Relative: 1 %
HCT: 39.4 % (ref 36.0–46.0)
Hemoglobin: 12.9 g/dL (ref 12.0–15.0)
Immature Granulocytes: 0 %
Lymphocytes Relative: 28 %
Lymphs Abs: 2.4 10*3/uL (ref 0.7–4.0)
MCH: 33.4 pg (ref 26.0–34.0)
MCHC: 32.7 g/dL (ref 30.0–36.0)
MCV: 102.1 fL — ABNORMAL HIGH (ref 80.0–100.0)
Monocytes Absolute: 0.8 10*3/uL (ref 0.1–1.0)
Monocytes Relative: 9 %
Neutro Abs: 5.3 10*3/uL (ref 1.7–7.7)
Neutrophils Relative %: 61 %
Platelets: 249 10*3/uL (ref 150–400)
RBC: 3.86 MIL/uL — ABNORMAL LOW (ref 3.87–5.11)
RDW: 12.9 % (ref 11.5–15.5)
WBC: 8.7 10*3/uL (ref 4.0–10.5)
nRBC: 0 % (ref 0.0–0.2)

## 2021-12-04 LAB — COMPREHENSIVE METABOLIC PANEL
ALT: 16 U/L (ref 0–44)
AST: 20 U/L (ref 15–41)
Albumin: 3.6 g/dL (ref 3.5–5.0)
Alkaline Phosphatase: 54 U/L (ref 38–126)
Anion gap: 8 (ref 5–15)
BUN: 15 mg/dL (ref 8–23)
CO2: 25 mmol/L (ref 22–32)
Calcium: 9.7 mg/dL (ref 8.9–10.3)
Chloride: 105 mmol/L (ref 98–111)
Creatinine, Ser: 0.84 mg/dL (ref 0.44–1.00)
GFR, Estimated: >60 mL/min (ref >60)
Glucose, Bld: 112 mg/dL — ABNORMAL HIGH (ref 70–99)
Potassium: 4.2 mmol/L (ref 3.5–5.1)
Sodium: 138 mmol/L (ref 135–145)
Total Bilirubin: 0.9 mg/dL (ref 0.3–1.2)
Total Protein: 7.4 g/dL (ref 6.5–8.1)

## 2021-12-04 LAB — URINALYSIS, ROUTINE W REFLEX MICROSCOPIC
Bilirubin Urine: NEGATIVE
Glucose, UA: NEGATIVE mg/dL
Hgb urine dipstick: NEGATIVE
Ketones, ur: NEGATIVE mg/dL
Leukocytes,Ua: NEGATIVE
Nitrite: NEGATIVE
Protein, ur: NEGATIVE mg/dL
Specific Gravity, Urine: 1.012 (ref 1.005–1.030)
pH: 5 (ref 5.0–8.0)

## 2021-12-04 LAB — LIPASE, BLOOD: Lipase: 29 U/L (ref 11–51)

## 2021-12-04 LAB — MAGNESIUM: Magnesium: 2.1 mg/dL (ref 1.7–2.4)

## 2021-12-04 MED ORDER — MORPHINE SULFATE (PF) 4 MG/ML IV SOLN
4.0000 mg | Freq: Once | INTRAVENOUS | Status: AC
  Administered 2021-12-04: 4 mg via INTRAVENOUS
  Filled 2021-12-04: qty 1

## 2021-12-04 MED ORDER — SODIUM CHLORIDE (PF) 0.9 % IJ SOLN
INTRAMUSCULAR | Status: AC
  Filled 2021-12-04: qty 50

## 2021-12-04 MED ORDER — ONDANSETRON HCL 4 MG/2ML IJ SOLN
4.0000 mg | Freq: Once | INTRAMUSCULAR | Status: AC
  Administered 2021-12-04: 4 mg via INTRAVENOUS
  Filled 2021-12-04: qty 2

## 2021-12-04 MED ORDER — IOHEXOL 300 MG/ML  SOLN
100.0000 mL | Freq: Once | INTRAMUSCULAR | Status: AC | PRN
  Administered 2021-12-04: 100 mL via INTRAVENOUS

## 2021-12-04 MED ORDER — HYDROCODONE-ACETAMINOPHEN 5-325 MG PO TABS: 1.0000 | ORAL_TABLET | ORAL | 0 refills | Status: DC | PRN

## 2021-12-04 NOTE — ED Provider Notes (Signed)
[facility] DEPT Provider Note   CSN: [id number] Arrival date & time: 12/04/21  0428     History  Chief Complaint  Patient presents with   Spasms    Stephanie Shea is a 88 y.o. female.  The history is provided by the patient and a relative.  She has history of hypertension, hyperlipidemia, atrial fibrillation anticoagulated on apixaban and comes in complaining of pain in her right shoulder and left hip.  She has been having pain in the right shoulder for several months and was evaluated by an orthopedic physician who gave her an injection and put on prednisone which did seem to give some temporary relief.  Left hip pain has been present for about the last 2 weeks and also seems to go to the left lower quadrant of the abdomen.  Tonight, she started screaming in pain.  She got a dose of tramadol which usually gives her relief, but it did not help her tonight.  Daughter notices that she is pulling her toes up like she is having muscle spasms.  There has been no fever or chills.  She denies any recent trauma.   Home Medications Prior to Admission medications   Medication Sig Start Date End Date Taking? Authorizing Provider  acetaminophen (TYLENOL) 500 MG tablet Take 2 tablets (1,000 mg total) by mouth 2 (two) times daily. 07/24/21   Medina-Vargas, Monina C, NP  amiodarone (PACERONE) 100 MG tablet Take 100 mg by mouth daily.    [provider]  Ammonium Lactate (AMLACTIN EX) thin layer topically; topical Once A Day - PRN Special Instructions: May apply topically once daily PRN for dry skin.    [provider]  apixaban (ELIQUIS) 5 MG TABS tablet Take 1 tablet (5 mg total) by mouth 2 (two) times daily. 02/24/21   Mast, [name], NP  atorvastatin (LIPITOR) 80 MG tablet Take 1 tablet (80 mg total) by mouth daily. 02/24/21   Mast, [name], NP  Cholecalciferol (VITAMIN D3) 50 MCG (2000 UT) TABS Take 2,000 Units by mouth daily after breakfast. 02/24/21   Mast,  [name], NP  docusate sodium (COLACE) 100 MG capsule Take 1 capsule (100 mg total) by mouth daily. 02/24/21   Mast, [name], NP  escitalopram (LEXAPRO) 10 MG tablet Take 1 tablet (10 mg total) by mouth daily. 10/20/21   Mast, [name], NP  fexofenadine (ALLEGRA) 180 MG tablet Take 1 tablet (180 mg total) by mouth daily as needed for allergies or rhinitis. 02/24/21   Mast, [name], NP  fluticasone (FLONASE) 50 MCG/ACT nasal spray Place 1 spray into both nostrils daily as needed for allergies or rhinitis. 02/24/21   Mast, [name], NP  lidocaine (SALONPAS PAIN RELIEVING) 4 % Place 1 patch onto the skin daily.    [provider]  losartan (COZAAR) 25 MG tablet Take 25 mg by mouth daily.    [provider]  melatonin 5 MG TABS Take 5 mg by mouth at bedtime.    [provider]  methocarbamol (ROBAXIN) 500 MG tablet Take 250 mg by mouth as needed.    [provider]  metoprolol succinate (TOPROL-XL) 50 MG 24 hr tablet Take 1 tablet (50 mg total) by mouth daily. Take with or immediately following a meal. 02/24/21   Mast, [name], NP  nitroGLYCERIN (NITROSTAT) 0.4 MG SL tablet Place 0.4 mg under the tongue as needed for chest pain (Every 5 minutes x 3 doses).    [provider]  nystatin (MYCOSTATIN/NYSTOP) powder Apply 1 Application topically 2 (two) times daily.    [provider]  omeprazole (PRILOSEC OTC) 20 MG tablet Take 1 tablet (20 mg total) by mouth daily as needed. 02/24/21   Mast, [name], NP  polyethylene glycol (MIRALAX / GLYCOLAX) 17 g packet Take 17 g by mouth daily. Give with 4 oz of water. 02/24/21   Mast, [name], NP  senna-docusate (SENOKOT-S) 8.6-50 MG tablet Take 2 tablets by mouth at bedtime.    [provider]  traMADol (ULTRAM) 50 MG tablet Take 1 tablet (50 mg total) by mouth every 6 (six) hours as needed. 06/30/21   Medina-Vargas, Monina C, NP  traMADol (ULTRAM) 50 MG tablet Take 0.5 tablets (25 mg total) by mouth at bedtime. 10/22/21    Medina-Vargas, Monina C, NP  vitamin B-12 (CYANOCOBALAMIN) 1000 MCG tablet Take 1,000 mcg by mouth daily.    [provider]  zinc oxide 20 % ointment Apply 1 application topically 3 (three) times daily as needed for irritation. Apply to buttocks after every incontinent episode and as needed for redness. 02/24/21   Mast, [name], NP      Allergies    Codeine    Review of Systems   Review of Systems  All other systems reviewed and are negative.   Physical Exam Updated Vital Signs BP (!) 154/67   Pulse 72   Temp 98.2 F (36.8 C)   Resp 19   Ht '5\' 8"'$  (1.727 m)   Wt 81.6 kg   SpO2 (S) 90%   BMI 27.37 kg/m  Physical Exam Vitals and nursing note reviewed.   88 year old female, resting comfortably and in no acute distress. Vital signs are significant for elevated blood pressure. Oxygen saturation is 90%, which is normal. Head is normocephalic and atraumatic. PERRLA, EOMI. Oropharynx is clear. Neck is nontender and supple without adenopathy or JVD. Back is mildly tender diffusely throughout the lumbar area.  There is no CVA tenderness. Lungs are clear without rales, wheezes, or rhonchi. Chest is nontender. Heart has regular rate and rhythm without murmur. Abdomen is soft, flat, with mild left lower quadrant tenderness.  There is no rebound or guarding. Extremities have 2+ edema with moderate venous stasis changes bilaterally, full range of motion is present.  There is slight swelling of the right shoulder and there is pain with passive range of motion.  There is no pain on passive range of motion of the left hip, and no tenderness to palpation. Skin is warm and dry without rash. Neurologic: Awake and alert, cranial nerves are intact, moves all extremities equally.  ED Results / Procedures / Treatments   Labs (all labs ordered are listed, but only abnormal results are displayed) Labs Reviewed  COMPREHENSIVE METABOLIC PANEL - Abnormal; Notable for the following components:       Result Value   Glucose, Bld 112 (*)    All other components within normal limits  CBC WITH DIFFERENTIAL/PLATELET - Abnormal; Notable for the following components:   RBC 3.86 (*)    MCV 102.1 (*)    All other components within normal limits  LIPASE, BLOOD  MAGNESIUM  URINALYSIS, ROUTINE W REFLEX MICROSCOPIC   Radiology No results found.  Procedures Procedures    Medications Ordered in ED Medications  morphine (PF) 4 MG/ML injection 4 mg (has no administration in time range)  ondansetron (ZOFRAN) injection 4 mg (has no administration in time range)    ED Course/ Medical  Decision Making/ A&P                           Medical Decision Making Amount and/or Complexity of Data Reviewed Labs: ordered. Radiology: ordered.  Risk Prescription drug management.   Right shoulder pain and should not cause.  Consider spontaneous hemarthrosis.  Consider crystal arthropathy such as [place] shoulder.  Considered exacerbation of longstanding degenerative changes.  Although she complains of left hip pain, dilation of the left hip is normal and I think the problem is more likely either abdominal or lumbar.  Consider lumbar radiculopathy, diverticulitis, urinary tract infection, degenerative joint disease of the lumbar spine.  I have ordered morphine for pain and I have ordered laboratory work-up of CBC, comprehensive metabolic panel, magnesium level lipase.  I have ordered x-rays of the right shoulder and CT of abdomen and pelvis.  CT of abdomen pelvis will also evaluate lumbar spine and left hip.  I reviewed and interpreted the laboratory test, and my interpretation is mild elevation of random glucose, macrocytosis without anemia, normal magnesium level.  Urinalysis is still pending.  CT of abdomen and pelvis is pending.  Case is signed out to Dr. Tamera Punt.  Final Clinical Impression(s) / ED Diagnoses Final diagnoses:  Pain and swelling of right shoulder  Pain of left hip joint  Left lower  quadrant abdominal pain  Macrocytosis without anemia  Elevated random blood glucose level    Rx / DC Orders ED Discharge Orders     None         Delora Fuel, MD 93/79/02 [id number]

## 2021-12-04 NOTE — Discharge Instructions (Addendum)
Continue to follow-up with your orthopedic doctor regarding your shoulder and hip pain.  Call to make an appointment to follow-up with the surgeon regarding the hernias, particular the right groin hernia.  Return to emergency if there is any worsening symptoms including increased pain, vomiting, fevers or color change in her groin area.  Make sure that she is taking a stool softener along with the pain medication.

## 2021-12-04 NOTE — ED Notes (Signed)
Given tramadol '50mg'$  0200 an '250mg'$  methocarbamol 0430

## 2021-12-04 NOTE — ED Triage Notes (Signed)
Arrives [facility] from [facility] with c/o muscle spasms. Has had increased pain for several months after a fall that occurred.   Was in independent living but recently moved to the skilled nursing side.   C/o left hip pain x 2 weeks and right shoulder pain of several months.

## 2021-12-04 NOTE — ED Provider Notes (Signed)
Care was taken over from Dr. Roxanne Mins.  Patient had been complaining of pain in her right shoulder and left hip.  She has been seeing an orthopedic surgeon for this.  Imaging studies do not reveal any fractures.  There is a lot of degenerative changes in the shoulder.  No suggestions of infection on clinical exam.  She had a CT scan of her abdomen and pelvis which showed a small age-indeterminate pression fracture of L2.  There is also some ventral hernias that are not containing bowel.  There is a right inguinal hernia which does contain a loop of bowel.  On clinical exam, this was easily reducible and not significantly tender on exam.  No suggestions of incarceration.  Her labs are reviewed and are nonconcerning.  At this point patient is feeling much better and is stable for discharge.  Discussed findings with family members at bedside.  They will plan on having her follow-up with the orthopedic surgeon regarding her ongoing pain.  Will prescribe a short course of Vicodin.  Did advise them that she will need to use a stool softener with this.  Also recommended close follow-up with the surgeon regarding the inguinal hernia.  They were given instructions to watch for any signs of worsening or incarceration.  Return precautions were given.   Stephanie Johns, MD 12/04/21 1018

## 2021-12-08 ENCOUNTER — Non-Acute Institutional Stay (SKILLED_NURSING_FACILITY): Payer: Medicare Other | Admitting: Nurse Practitioner

## 2021-12-08 ENCOUNTER — Encounter: Payer: Self-pay | Admitting: Nurse Practitioner

## 2021-12-08 DIAGNOSIS — R609 Edema, unspecified: Secondary | ICD-10-CM

## 2021-12-08 DIAGNOSIS — I25709 Atherosclerosis of coronary artery bypass graft(s), unspecified, with unspecified angina pectoris: Secondary | ICD-10-CM | POA: Diagnosis not present

## 2021-12-08 DIAGNOSIS — N183 Chronic kidney disease, stage 3 unspecified: Secondary | ICD-10-CM | POA: Diagnosis not present

## 2021-12-08 DIAGNOSIS — I480 Paroxysmal atrial fibrillation: Secondary | ICD-10-CM

## 2021-12-08 DIAGNOSIS — M159 Polyosteoarthritis, unspecified: Secondary | ICD-10-CM | POA: Diagnosis not present

## 2021-12-08 DIAGNOSIS — I10 Essential (primary) hypertension: Secondary | ICD-10-CM | POA: Diagnosis not present

## 2021-12-08 DIAGNOSIS — K4090 Unilateral inguinal hernia, without obstruction or gangrene, not specified as recurrent: Secondary | ICD-10-CM | POA: Diagnosis not present

## 2021-12-08 DIAGNOSIS — I639 Cerebral infarction, unspecified: Secondary | ICD-10-CM | POA: Diagnosis not present

## 2021-12-08 DIAGNOSIS — K5901 Slow transit constipation: Secondary | ICD-10-CM | POA: Diagnosis not present

## 2021-12-08 DIAGNOSIS — G459 Transient cerebral ischemic attack, unspecified: Secondary | ICD-10-CM

## 2021-12-08 DIAGNOSIS — F01B3 Vascular dementia, moderate, with mood disturbance: Secondary | ICD-10-CM

## 2021-12-08 DIAGNOSIS — F5105 Insomnia due to other mental disorder: Secondary | ICD-10-CM

## 2021-12-08 DIAGNOSIS — K219 Gastro-esophageal reflux disease without esophagitis: Secondary | ICD-10-CM | POA: Diagnosis not present

## 2021-12-08 DIAGNOSIS — F418 Other specified anxiety disorders: Secondary | ICD-10-CM

## 2021-12-08 NOTE — Assessment & Plan Note (Addendum)
pain R shoulder-X-ray showed no acute fx, L hip, will schedule Norco q6hr for now.   left knee s/p TKR, MR lumbar, severe spinal 11/13/20 spinal and foraminal stenosis with left sciatica, taking Tramadol, Methocarbamol.

## 2021-12-08 NOTE — Assessment & Plan Note (Signed)
Blood pressure is controlled, takes Losartan, Metoprolol.

## 2021-12-08 NOTE — Progress Notes (Signed)
Location:   SNF [place] Room Number: 40 Place of Service:  SNF (31) Provider: Lennie Odor Pheobe Sandiford NP  Abra Lingenfelter X, NP  Patient Care Team: Blayre Papania X, NP as PCP - General (Internal Medicine) Leonie Keziyah Kneale, MD as PCP - Cardiology (Cardiology) Chanele Douglas X, NP as Nurse Practitioner (Internal Medicine) Lavonna Monarch, MD (Inactive) as Consulting Physician (Dermatology)  Extended Emergency Contact Information Primary Emergency Contact: Wittmann Mobile Phone: [phone] Relation: Daughter Secondary Emergency Contact: Weeks,Tiffany Mobile Phone: [phone] Relation: Daughter Preferred language: English Interpreter needed? No  Code Status: DNR Goals of care: Advanced Directive information    12/04/2021    4:50 AM  Advanced Directives  Does Patient Have a Medical Advance Directive? No  Would patient like information on creating a medical advance directive? No - Patient declined     Chief Complaint  Patient presents with   Acute Visit    Follow hospitalization    HPI:  Pt is a 88 y.o. female seen today for an acute visit for follow ED evaluation 12/04/21  ED eval 12/04/21 for pain R shoulder-X-ray showed no acute fx, L hip, LLQ pain-CT abd identified inguinal hernia w/o obstruction or gangrene-no further c/o abd pain. OA pain is managed with Norco. HPOA daughter desires comfort measures presently.    TIA, stable, on Eliquis, Atorvastatin.  CVA, Neurology: cerebral infarction,MRI chronic right cerebellar stroke, mild to moderate generalized atrophy, small vessel disease with multiple chronic lacunar infarcts, possible left sided stroke not seen on imaging-saw 2 fingers in front of her upon my examination,  numbness in right side of tongue, R sided deficits,  transient vision disturbance of R eye.  OA, left knee s/p TKR, MR lumbar, severe spinal 11/13/20 spinal and foraminal stenosis with left sciatica, taking Tramadol, Norco,  Methocarbamol.               Constipation, stable, on Senna, MiraLax.              Afib, takes Metoprolol, Eliquis, Amiodarone, f/u cardiology. TSH 1.39 07/03/21             HTN, takes Losartan, Metoprolol.              GERD, stable, takes Omeprazole, Hgb 12.9 12/04/21             CAD, stable, Hx of CABG Cath 04/25/19, prn NTG, takes Atorvastatin. LDL 74 04/05/21             CKD Bun/creat 15/0.84 12/04/21             Insomnia/OSA, not using CPAP, not sleeping well, Lorazepam helps.              Depression, emotional/teary,  off Sertraline 2/2 confusion. Stable since Lexapro '10mg'$  qd, prn Lorazepam.              Vascular cognitive impairment, MMSE 27/30, SNF for supportive care. Vit B12 324 07/03/21, on Vit B12 1033mg qd.              BLE edema, mild, EF 60% 08/03/21, off Furosemide   Past Medical History:  Diagnosis Date   Atherosclerosis of native coronary artery 2001   LAD & Cx disease -- BMS PCI in 2001; CABG 2002.  PCI in 2006 (per-op [facility])   Atrial fibrillation and flutter ([place] 04/2019   New diagnosis-major complaint was chest tightness and dyspnea.   [place]    Cervix dysplasia    GERD (gastroesophageal reflux disease)  Heart attack ([place]) 2002, 2006   Both events reportedly were perioperatively. She has no recollection. Initial PCI was related to angina symptoms (bilateral arm pain)   High cholesterol    Hypertension    Lumbosacral spinal stenosis    Osteoarthritis    Skin cancer    Sleep apnea    Uses CPAP faithfully   Squamous cell carcinoma of skin 02/12/2020   in situ- left neck-anterior ([id number])   Past Surgical History:  Procedure Laterality Date   ABDOMINAL HYSTERECTOMY     bone density     CARDIAC CATHETERIZATION  2001    Dr. Glade Lloyd [facility]) 2 vessel CAD involving LAD-D1 and distal Cx-OM. Appearance of dilated tapered left main with no significant atherosclerosis on IVUS --  BMS PCI of pLAD ([place] BMS 3.0 mm x 15 mm) & with PTCA of ostial D1, PTCA of dLAD.  BMS PCI pCx [facility]  BMS 3.5 mm x 11 mm) - unable to reopen dCx-OM (probable distal Atheroembolic occlusion.  Normal LVEF.   COLONOSCOPY     CORONARY ARTERY BYPASS GRAFT  2002   Unsure of details [facility], in [facility])   [place]  2001   ([place]) [place]ale BMS PCI pLAD (3.0 mm x 15 mm) -PTCA of jailed D1 as well as distal LAD; pCx (Royale BMS 3.5 mm x 11 mm) -unable to open distalCx-OM -thromboembolic occlusion   DIAGNOSTIC MAMMOGRAM     LEFT HEART CATH AND CORS/GRAFTS ANGIOGRAPHY N/A 04/25/2019   Procedure: LEFT HEART CATH AND CORS/GRAFTS ANGIOGRAPHY;  Surgeon: Troy Sine, MD;  Location: [facility] INV[facility]IVE CV LAB;;; ost-prox LCx stent @ OM1 ostium =100% CTO, Ost-prox LAD stent ~30% ISR, Ost D1 CTO, prox RCA ~30%. SCG-OM2 patent with ostial stent 30%, LIMA-LAD patent; CTO of SVG-D1.    left knee replacement     MASTECTOMY     NM MYOVIEW LTD  12/2014; 2018   Cardiologist (Dr. Kellie Simmering.  [place], [place] ph# [phone]) -->LEXISCAN: Nonischemic, "normal "   pap smear     tonsilectomy     TRANSTHORACIC ECHOCARDIOGRAM  12/2014   Echocardiogram 12/25/14- normal LVEF and systolic function, EF 09-81%, mild grade 1 diastolic dysfunction   TRANSTHORACIC ECHOCARDIOGRAM  04/24/2019   EF 65 to 70%.  Moderate septal LVH.  GR 1 DD-elevated EDP.  Normal PAP.  Relatively normal valves.  No aortic stenosis.    Allergies  Allergen Reactions   Codeine Nausea Only    Allergies as of 12/08/2021       Reactions   Codeine Nausea Only        Medication List        Accurate as of December 08, 2021  3:53 PM. If you have any questions, ask your nurse or doctor.          acetaminophen 500 MG tablet Commonly known as: TYLENOL Take 2 tablets (1,000 mg total) by mouth 2 (two) times daily.   amiodarone 100 MG tablet Commonly known as: PACERONE Take 100 mg by mouth daily.   AMLACTIN EX thin layer topically; topical Once A Day - PRN Special Instructions: May apply topically once daily PRN for dry  skin.   apixaban 5 MG Tabs tablet Commonly known as: Eliquis Take 1 tablet (5 mg total) by mouth 2 (two) times daily.   atorvastatin 80 MG tablet Commonly known as: LIPITOR Take 1 tablet (80 mg total) by mouth daily.   cyanocobalamin 1000 MCG tablet Commonly known as: VITAMIN B12 Take 1,000 mcg  by mouth daily.   docusate sodium 100 MG capsule Commonly known as: COLACE Take 1 capsule (100 mg total) by mouth daily.   escitalopram 10 MG tablet Commonly known as: Lexapro Take 1 tablet (10 mg total) by mouth daily.   fexofenadine 180 MG tablet Commonly known as: ALLEGRA Take 1 tablet (180 mg total) by mouth daily as needed for allergies or rhinitis.   fluticasone 50 MCG/ACT nasal spray Commonly known as: FLONASE Place 1 spray into both nostrils daily as needed for allergies or rhinitis.   HYDROcodone-acetaminophen 5-325 MG tablet Commonly known as: NORCO/VICODIN Take 1 tablet by mouth every 4 (four) hours as needed.   losartan 25 MG tablet Commonly known as: COZAAR Take 25 mg by mouth daily.   melatonin 5 MG Tabs Take 5 mg by mouth at bedtime.   methocarbamol 500 MG tablet Commonly known as: ROBAXIN Take 250 mg by mouth as needed.   metoprolol succinate 50 MG 24 hr tablet Commonly known as: TOPROL-XL Take 1 tablet (50 mg total) by mouth daily. Take with or immediately following a meal.   nitroGLYCERIN 0.4 MG SL tablet Commonly known as: NITROSTAT Place 0.4 mg under the tongue as needed for chest pain (Every 5 minutes x 3 doses).   nystatin powder Commonly known as: MYCOSTATIN/NYSTOP Apply 1 Application topically 2 (two) times daily.   omeprazole 20 MG tablet Commonly known as: PRILOSEC OTC Take 1 tablet (20 mg total) by mouth daily as needed.   polyethylene glycol 17 g packet Commonly known as: MIRALAX / GLYCOLAX Take 17 g by mouth daily. Give with 4 oz of water.   Salonpas Pain Relieving 4 % Generic drug: lidocaine Place 1 patch onto the skin daily.    senna-docusate 8.6-50 MG tablet Commonly known as: Senokot-S Take 2 tablets by mouth at bedtime.   traMADol 50 MG tablet Commonly known as: Ultram Take 1 tablet (50 mg total) by mouth every 6 (six) hours as needed.   traMADol 50 MG tablet Commonly known as: ULTRAM Take 0.5 tablets (25 mg total) by mouth at bedtime.   Vitamin D3 50 MCG (2000 UT) Tabs Take 2,000 Units by mouth daily after breakfast.   zinc oxide 20 % ointment Apply 1 application topically 3 (three) times daily as needed for irritation. Apply to buttocks after every incontinent episode and as needed for redness.        Review of Systems  Constitutional:  Negative for activity change, appetite change and fever.  HENT:  Negative for congestion, hearing loss and voice change.   Eyes:  Negative for visual disturbance.       C/o eye sight issue upon first awake in am, mostly R eye, better as day goes on.   Respiratory:  Negative for chest tightness and shortness of breath.   Cardiovascular:  Positive for leg swelling.  Gastrointestinal:  Negative for abdominal pain, blood in stool, constipation, nausea and vomiting.  Genitourinary:  Negative for dysuria and urgency.  Musculoskeletal:  Positive for arthralgias, back pain and gait problem.       S/p L TKR,  left hip pain is better, right shoulder pain as well.   Skin:  Negative for color change.          Neurological:  Positive for numbness. Negative for speech difficulty and headaches.       Memory lapses. C/o numbness right side of tongue.   Hematological:  Does not bruise/bleed easily.  Psychiatric/Behavioral:  Positive for sleep disturbance. Negative for  behavioral problems and dysphoric mood. The patient is nervous/anxious.        Better    Immunization History  Administered Date(s) Administered   Fluad Quad(high Dose 65+) 10/28/2020   Influenza, High Dose Seasonal PF 10/21/2016, 09/24/2017, 10/25/2019   Influenza-Unspecified 10/03/2011, 10/04/2012,  01/13/2016   Moderna SARS-COV2 Booster Vaccination 11/21/2019, 06/11/2020, 05/30/2021   Moderna Sars-Covid-2 Vaccination 01/16/2019, 02/13/2019, 03/13/2019   PNEUMOCOCCAL CONJUGATE-20 07/03/2021   Pneumococcal-Unspecified 01/13/2016   Tetanus 12/01/2017   Zoster Recombinat (Shingrix) 08/24/2018, 02/22/2020   Pertinent  Health Maintenance Due  Topic Date Due   HEMOGLOBIN A1C  12/01/2019   OPHTHALMOLOGY EXAM  05/30/2021   INFLUENZA VACCINE  08/12/2021   FOOT EXAM  10/17/2021   DEXA SCAN  Completed      10/17/2020    2:53 PM 02/07/2021   12:47 PM 06/19/2021   12:11 AM 09/11/2021    3:39 PM 12/04/2021    4:51 AM  Fall Risk  Falls in the past year? 0 1  0   Was there an injury with Fall? 0 0  0   Fall Risk Category Calculator 0 1  0   Fall Risk Category Low Low  Low   Patient Fall Risk Level Low fall risk Moderate fall risk Moderate fall risk Low fall risk High fall risk  Patient at Risk for Falls Due to No Fall Risks History of fall(s);Impaired balance/gait;Impaired mobility;Orthopedic patient  No Fall Risks   Patient at Risk for Falls Due to - Comments  spinal stenosis of lumbar     Fall risk Follow up Falls evaluation completed Falls evaluation completed;Education provided;Falls prevention discussed  Falls evaluation completed    Functional Status Survey:    Vitals:   12/08/21 1533  BP: 137/71  Pulse: 72  Resp: 16  Temp: (!) 97.5 F (36.4 C)  SpO2: 94%  Weight: 187 lb 12.8 oz (85.2 kg)   Body mass index is 28.55 kg/m. Physical Exam Constitutional:      Appearance: Normal appearance.  HENT:     Head: Normocephalic and atraumatic.     Nose: Nose normal.     Mouth/Throat:     Mouth: Mucous membranes are moist.  Eyes:     Extraocular Movements: Extraocular movements intact.     Pupils: Pupils are equal, round, and reactive to light.  Cardiovascular:     Rate and Rhythm: Normal rate and regular rhythm.     Heart sounds: Murmur heard.  Pulmonary:     Effort:  Pulmonary effort is normal.     Breath sounds: Normal breath sounds. No rales.  Abdominal:     General: Bowel sounds are normal.     Palpations: Abdomen is soft.     Tenderness: There is no abdominal tenderness. There is no right CVA tenderness, left CVA tenderness, guarding or rebound.     Hernia: A hernia is present.     Comments: R inguinal hernia per CT abd.   Musculoskeletal:     Cervical back: Normal range of motion and neck supple.     Right lower leg: Edema present.     Left lower leg: Edema present.     Comments: Trace edema BLE. Slightly R arm weakness. C/o right shoulder/arm aches. No decreased ROM  Skin:    General: Skin is warm and dry.     Comments: From previous exam: BLE cool to touch, dependent rubor noted. Mild venous insufficiency skin changes above ankles.  S/p right mastectomy.   Neurological:  General: No focal deficit present.     Mental Status: She is alert and oriented to person, place, and time. Mental status is at baseline.     Motor: Weakness present.     Coordination: Coordination normal.     Gait: Gait abnormal.     Comments: C/o numbness of the right side of tongue. Saw 2 fingers in front of her upon my examination left eye.   Psychiatric:        Mood and Affect: Mood normal.        Behavior: Behavior normal.        Thought Content: Thought content normal.     Labs reviewed: Recent Labs    06/19/21 0006 06/19/21 0010 08/26/21 0000 12/04/21 0641  NA 140 141 141 138  K 4.1 4.0 4.1 4.2  CL 104 103 103 105  CO2 28  --  29* 25  GLUCOSE 108* 106*  --  112*  BUN '14 17 17 15  '$ CREATININE 0.85 0.80 0.9 0.84  CALCIUM 9.8  --  10.0 9.7  MG  --   --   --  2.1   Recent Labs    03/26/21 0000 12/04/21 0641  AST 15 20  ALT 12 16  ALKPHOS 47 54  BILITOT  --  0.9  PROT  --  7.4  ALBUMIN 3.8 3.6   Recent Labs    03/26/21 0000 05/01/21 0000 06/19/21 0006 06/19/21 0010 12/04/21 0641  WBC 6.5 6.9 7.2  --  8.7  NEUTROABS 3,627.00  --  3.5   --  5.3  HGB 11.9* 11.7* 13.2 13.9 12.9  HCT 35* 35* 41.8 41.0 39.4  MCV  --   --  105.0*  --  102.1*  PLT 226 221 216  --  249   Lab Results  Component Value Date   TSH 1.390 07/03/2021   Lab Results  Component Value Date   HGBA1C 5.9 (H) 05/31/2019   Lab Results  Component Value Date   CHOL 175 07/03/2021   HDL 59 07/03/2021   LDLCALC 91 07/03/2021   TRIG 146 07/03/2021   CHOLHDL 2.4 04/05/2020    Significant Diagnostic Results in last 30 days:  CT ABDOMEN PELVIS W CONTRAST  Result Date: 12/04/2021 CLINICAL DATA:  Left lower quadrant abdominal pain. EXAM: CT ABDOMEN AND PELVIS WITH CONTRAST TECHNIQUE: Multidetector CT imaging of the abdomen and pelvis was performed using the standard protocol following bolus administration of intravenous contrast. RADIATION DOSE REDUCTION: This exam was performed according to the departmental dose-optimization program which includes automated exposure control, adjustment of the mA and/or kV according to patient size and/or use of iterative reconstruction technique. CONTRAST:  131m OMNIPAQUE IOHEXOL 300 MG/ML  SOLN COMPARISON:  None Available. FINDINGS: Lower chest: Patchy airspace opacities are present at the lung bases, left greater than right. No significant effusions are present. Hepatobiliary: Liver is unremarkable. Layering gallstones are present without inflammatory change. The common bile duct is within normal limits for age. Pancreas: Unremarkable. No pancreatic ductal dilatation or surrounding inflammatory changes. Spleen: Normal in size without focal abnormality. Adrenals/Urinary Tract: The adrenal glands are within normal limits bilaterally. An 8.8 cm simple exophytic cyst emanates from the posterior aspect of the left kidney. Kidneys are otherwise unremarkable. No parenchymal lesion or stone is present. No obstruction is present. Ureters are within normal limits. The urinary bladder is normal. Stomach/Bowel: The stomach is within normal  limits. Duodenal ulcer noted without inflammatory change. Small bowel is un remarkable proximally. Distal  small bowel herniates into the right inguinal canal without obstruction. Terminal ileum is within normal limits. Appendix is visualized and within normal limits. Ascending colon is normal. Moderate stool is present throughout the transverse colon without obstruction. No inflammation is present. Descending colon is normal. Sigmoid colon is somewhat redundant without focal inflammation or obstruction. Vascular/Lymphatic: Atherosclerotic calcifications are present in the aorta and branch vessels. No aneurysm is present. No significant adenopathy is present. Reproductive: Status post hysterectomy. No adnexal masses. Other: Bilateral paraumbilical hernias contain fat without bowel. Right inguinal hernia contains a loop of small bowel as described above. A left paramedian ventral hernia anterior to the stomach contains fat without bowel. The opening measures 6 mm. No free fluid or free air is present. Musculoskeletal: Grade 1 degenerative anterolisthesis is present at L4-5 and L5-S1. An inferior endplate fracture at L2 is age indeterminate. No other acute fractures are present. Bony pelvis is within normal limits. The hips are located and within normal limits. IMPRESSION: 1. No acute or focal lesion to explain the patient's left lower quadrant abdominal pain. 2. Right inguinal hernia contains a loop of small bowel without obstruction. 3. Bilateral paraumbilical hernias contain fat without bowel. 4. Left paramedian ventral hernia anterior to the stomach contains fat without bowel. 5. Cholelithiasis without evidence of cholecystitis. 6. Patchy airspace opacities at the lung bases, left greater than right. This may represent atelectasis or infection. 7. Age indeterminate inferior endplate fracture at L2. 8. Grade 1 degenerative anterolisthesis at L4-5 and L5-S1. 9. 8.8 cm simple exophytic cyst emanates from the posterior  aspect of the left kidney. No follow-up imaging is recommended. JACR 2018 Feb; 264-273, Management of the Incidental Renal Mass on CT, [employer] 2021; 814-848, Bosniak Classification of Cystic Renal Masses, Version 2019. 10.  Aortic Atherosclerosis (ICD10-I70.0). Electronically Signed   By: San Morelle M.D.   On: 12/04/2021 08:39   DG Shoulder Right  Result Date: 12/04/2021 CLINICAL DATA:  Persistent shoulder pain after falling several months previously EXAM: RIGHT SHOULDER - 2+ VIEW COMPARISON:  Prior chest x-ray 04/23/2019 FINDINGS: No acute fracture or malalignment. Degenerative osteoarthritis is present at the acromioclavicular joint. Rounded calcification overlies the right chest. This may be within the breast, or the lung but is unchanged compared to prior imaging from 2778 and almost certainly benign. Chronic bronchitic changes visualized in the lung. Mild degenerative changes at the rotator cuff insertion. IMPRESSION: 1. Degenerative osteoarthritis at the acromioclavicular joint. 2. Degenerative changes at the rotator cuff insertion. 3. No acute fracture or malalignment. Electronically Signed   By: Jacqulynn Cadet M.D.   On: 12/04/2021 07:14    Assessment/Plan: Right inguinal hernia ED eval 12/04/21 for LLQ pain-CT abd identified inguinal hernia w/o obstruction or gangrene-no further c/o abd pain. OA pain is managed with Norco. HPOA daughter desires comfort measures presently. Surgical consultation if desires.   Osteoarthritis of multiple joints pain R shoulder-X-ray showed no acute fx, L hip, will schedule Norco q6hr for now.   left knee s/p TKR, MR lumbar, severe spinal 11/13/20 spinal and foraminal stenosis with left sciatica, taking Tramadol, Methocarbamol.   TIA (transient ischemic attack) stable, on Eliquis, Atorvastatin.   CVA (cerebral vascular accident) [facility]) Neurology: cerebral infarction,MRI chronic right cerebellar stroke, mild to moderate generalized atrophy,  small vessel disease with multiple chronic lacunar infarcts, possible left sided stroke not seen on imaging-saw 2 fingers in front of her upon my examination,  numbness in right side of tongue, R sided deficits,  transient vision disturbance of R  eye.   Constipation  stable, on Senna, MiraLax.   Paroxysmal atrial fibrillation/flutter (HCC) takes Metoprolol, Eliquis, Amiodarone, f/u cardiology. TSH 1.39 07/03/21  Essential hypertension Blood pressure is controlled, takes Losartan, Metoprolol.   Gastroesophageal reflux disease stable, takes Omeprazole, Hgb 12.9 12/04/21  Coronary artery disease involving coronary bypass graft of native heart with angina pectoris (HCC) stable, Hx of CABG Cath 04/25/19, prn NTG, takes Atorvastatin. LDL 74 04/05/21  CKD (chronic kidney disease) stage 3, GFR 30-59 ml/min (HCC) Bun/creat 15/0.84 12/04/21  Insomnia secondary to depression with anxiety not using CPAP, not sleeping well, Lorazepam helps.  emotional/teary,  off Sertraline 2/2 confusion. Stable since Lexapro '10mg'$  qd, prn Lorazepam.   Edema Trace edema BLE, off Furosemide.   Vascular dementia ([place]) MMSE 27/30, SNF for supportive care. Vit B12 324 07/03/21, on Vit B12 1073mg qd. HPOA desires comfort measures, obtain MOST    Family/ staff Communication: plan of care reviewed with the patient, the patient's daughter HPOA, and charge nurse.   Labs/tests ordered: none  Time spend 35 minutes.

## 2021-12-08 NOTE — Assessment & Plan Note (Signed)
stable, on Senna, MiraLax.

## 2021-12-08 NOTE — Assessment & Plan Note (Signed)
Neurology: cerebral infarction,MRI chronic right cerebellar stroke, mild to moderate generalized atrophy, small vessel disease with multiple chronic lacunar infarcts, possible left sided stroke not seen on imaging-saw 2 fingers in front of her upon my examination,  numbness in right side of tongue, R sided deficits,  transient vision disturbance of R eye.

## 2021-12-08 NOTE — Assessment & Plan Note (Signed)
stable, on Eliquis, Atorvastatin.

## 2021-12-08 NOTE — Assessment & Plan Note (Signed)
ED eval 12/04/21 for LLQ pain-CT abd identified inguinal hernia w/o obstruction or gangrene-no further c/o abd pain. OA pain is managed with Norco. HPOA daughter desires comfort measures presently. Surgical consultation if desires.

## 2021-12-08 NOTE — Assessment & Plan Note (Signed)
not using CPAP, not sleeping well, Lorazepam helps.  emotional/teary,  off Sertraline 2/2 confusion. Stable since Lexapro '10mg'$  qd, prn Lorazepam.

## 2021-12-08 NOTE — Assessment & Plan Note (Signed)
Bun/creat 15/0.84 12/04/21

## 2021-12-08 NOTE — Assessment & Plan Note (Signed)
takes Metoprolol, Eliquis, Amiodarone, f/u cardiology. TSH 1.39 07/03/21

## 2021-12-08 NOTE — Assessment & Plan Note (Signed)
MMSE 27/30, SNF for supportive care. Vit B12 324 07/03/21, on Vit B12 1014mg qd. HPOA desires comfort measures, obtain MOST

## 2021-12-08 NOTE — Assessment & Plan Note (Signed)
stable, Hx of CABG Cath 04/25/19, prn NTG, takes Atorvastatin. LDL 74 04/05/21

## 2021-12-08 NOTE — Assessment & Plan Note (Signed)
Trace edema BLE, off Furosemide.

## 2021-12-08 NOTE — Assessment & Plan Note (Signed)
stable, takes Omeprazole, Hgb 12.9 12/04/21

## 2021-12-08 NOTE — Progress Notes (Unsigned)
Location:  [place] Room Number: NO/43/A Place of Service:  SNF (31) Provider:  Bhargav Barbaro X, NP  Patient Care Team: Shadasia Oldfield X, NP as PCP - General (Internal Medicine) Leonie Sebastian Dzik, MD as PCP - Cardiology (Cardiology) Anayeli Arel X, NP as Nurse Practitioner (Internal Medicine) Lavonna Monarch, MD (Inactive) as Consulting Physician (Dermatology)  Extended Emergency Contact Information Primary Emergency Contact: [place] Mobile Phone: [phone] Relation: Daughter Secondary Emergency Contact: Weeks,Tiffany Mobile Phone: [phone] Relation: Daughter Preferred language: English Interpreter needed? No  Code Status:  DNR Goals of care: Advanced Directive information    12/04/2021    4:50 AM  Advanced Directives  Does Patient Have a Medical Advance Directive? No  Would patient like information on creating a medical advance directive? No - Patient declined     Chief Complaint  Patient presents with  . Acute Visit    Patient is being seen for generalized pain  . Error    HPI:  Pt is a 88 y.o. female seen today for an acute visit for    Past Medical History:  Diagnosis Date  . Atherosclerosis of native coronary artery 2001   LAD & Cx disease -- BMS PCI in 2001; CABG 2002.  PCI in 2006 (per-op TAH)  . Atrial fibrillation and flutter ([place]) 04/2019   New diagnosis-major complaint was chest tightness and dyspnea.  . Breast cancer ([place])   . Cervix dysplasia   . GERD (gastroesophageal reflux disease)   . Heart attack ([place]) 2002, 2006   Both events reportedly were perioperatively. She has no recollection. Initial PCI was related to angina symptoms (bilateral arm pain)  . High cholesterol   . Hypertension   . Lumbosacral spinal stenosis   . Osteoarthritis   . Skin cancer   . Sleep apnea    Uses CPAP faithfully  . Squamous cell carcinoma of skin 02/12/2020   in situ- left neck-anterior (CX35FU)   Past Surgical History:  Procedure  Laterality Date  . ABDOMINAL HYSTERECTOMY    . bone density    . CARDIAC CATHETERIZATION  2001    Dr. Glade Lloyd [facility]) 2 vessel CAD involving LAD-D1 and distal Cx-OM. Appearance of dilated tapered left main with no significant atherosclerosis on IVUS --  BMS PCI of pLAD ([facility] BMS 3.0 mm x 15 mm) & with PTCA of ostial D1, PTCA of dLAD.  BMS PCI pCx [facility] BMS 3.5 mm x 11 mm) - unable to reopen dCx-OM (probable distal Atheroembolic occlusion.  Normal LVEF.  Marland Kitchen COLONOSCOPY    . CORONARY ARTERY BYPASS GRAFT  2002   Unsure of details [facility])  . CORONARY STENT INTERVENTION  2001   ([place]) [facility] BMS PCI pLAD (3.0 mm x 15 mm) -PTCA of jailed D1 as well as distal LAD; pCx ([facility] BMS 3.5 mm x 11 mm) -unable to open distalCx-OM -thromboembolic occlusion  . DIAGNOSTIC MAMMOGRAM    . LEFT HEART CATH AND CORS/GRAFTS ANGIOGRAPHY N/A 04/25/2019   Procedure: LEFT HEART CATH AND CORS/GRAFTS ANGIOGRAPHY;  Surgeon: Troy Sine, MD;  Location: [facility] INV[facility] CV LAB;;; ost-prox LCx stent @ OM1 ostium =100% CTO, Ost-prox LAD stent ~30% ISR, Ost D1 CTO, prox RCA ~30%. SCG-OM2 patent with ostial stent 30%, LIMA-LAD patent; CTO of SVG-D1.   Marland Kitchen left knee replacement    . MASTECTOMY    . NM MYOVIEW LTD  12/2014; 2018   Cardiologist (Dr. Kellie Simmering.  [place], [place] ph# [phone]) -->LEXISCAN: Nonischemic, "normal "  .  pap smear    . tonsilectomy    . TRANSTHORACIC ECHOCARDIOGRAM  12/2014   Echocardiogram 12/25/14- normal LVEF and systolic function, EF 91-63%, mild grade 1 diastolic dysfunction  . TRANSTHORACIC ECHOCARDIOGRAM  04/24/2019   EF 65 to 70%.  Moderate septal LVH.  GR 1 DD-elevated EDP.  Normal PAP.  Relatively normal valves.  No aortic stenosis.    Allergies  Allergen Reactions  . Codeine Nausea Only    Outpatient Encounter Medications as of 12/08/2021  Medication Sig  . acetaminophen (TYLENOL) 500 MG tablet Take 2 tablets (1,000 mg total) by mouth 2 (two) times daily.  Marland Kitchen  amiodarone (PACERONE) 100 MG tablet Take 100 mg by mouth daily.  . Ammonium Lactate (AMLACTIN EX) thin layer topically; topical Once A Day - PRN Special Instructions: May apply topically once daily PRN for dry skin.  Marland Kitchen apixaban (ELIQUIS) 5 MG TABS tablet Take 1 tablet (5 mg total) by mouth 2 (two) times daily.  Marland Kitchen atorvastatin (LIPITOR) 80 MG tablet Take 1 tablet (80 mg total) by mouth daily.  . Cholecalciferol (VITAMIN D3) 50 MCG (2000 UT) TABS Take 2,000 Units by mouth daily after breakfast.  . docusate sodium (COLACE) 100 MG capsule Take 1 capsule (100 mg total) by mouth daily.  Marland Kitchen escitalopram (LEXAPRO) 10 MG tablet Take 1 tablet (10 mg total) by mouth daily.  . fexofenadine (ALLEGRA) 180 MG tablet Take 1 tablet (180 mg total) by mouth daily as needed for allergies or rhinitis.  . fluticasone (FLONASE) 50 MCG/ACT nasal spray Place 1 spray into both nostrils daily as needed for allergies or rhinitis.  Marland Kitchen HYDROcodone-acetaminophen (NORCO/VICODIN) 5-325 MG tablet Take 1 tablet by mouth every 4 (four) hours as needed.  . lidocaine (SALONPAS PAIN RELIEVING) 4 % Place 1 patch onto the skin daily.  Marland Kitchen losartan (COZAAR) 25 MG tablet Take 25 mg by mouth daily.  . melatonin 5 MG TABS Take 5 mg by mouth at bedtime.  . methocarbamol (ROBAXIN) 500 MG tablet Take 250 mg by mouth as needed.  . metoprolol succinate (TOPROL-XL) 50 MG 24 hr tablet Take 1 tablet (50 mg total) by mouth daily. Take with or immediately following a meal.  . nitroGLYCERIN (NITROSTAT) 0.4 MG SL tablet Place 0.4 mg under the tongue as needed for chest pain (Every 5 minutes x 3 doses).  . nystatin (MYCOSTATIN/NYSTOP) powder Apply 1 Application topically 2 (two) times daily.  Marland Kitchen omeprazole (PRILOSEC OTC) 20 MG tablet Take 1 tablet (20 mg total) by mouth daily as needed.  . polyethylene glycol (MIRALAX / GLYCOLAX) 17 g packet Take 17 g by mouth daily. Give with 4 oz of water.  . senna-docusate (SENOKOT-S) 8.6-50 MG tablet Take 2 tablets by  mouth at bedtime.  . traMADol (ULTRAM) 50 MG tablet Take 1 tablet (50 mg total) by mouth every 6 (six) hours as needed.  . traMADol (ULTRAM) 50 MG tablet Take 0.5 tablets (25 mg total) by mouth at bedtime.  . vitamin B-12 (CYANOCOBALAMIN) 1000 MCG tablet Take 1,000 mcg by mouth daily.  Marland Kitchen zinc oxide 20 % ointment Apply 1 application topically 3 (three) times daily as needed for irritation. Apply to buttocks after every incontinent episode and as needed for redness.   No facility-administered encounter medications on file as of 12/08/2021.    Review of Systems  Immunization History  Administered Date(s) Administered  . Fluad Quad(high Dose 65+) 10/28/2020  . Influenza, High Dose Seasonal PF 10/21/2016, 09/24/2017, 10/25/2019  . Influenza-Unspecified 10/03/2011, 10/04/2012, 01/13/2016  .  Moderna SARS-COV2 Booster Vaccination 11/21/2019, 06/11/2020, 05/30/2021  . Moderna Sars-Covid-2 Vaccination 01/16/2019, 02/13/2019, 03/13/2019  . PNEUMOCOCCAL CONJUGATE-20 07/03/2021  . Pneumococcal-Unspecified 01/13/2016  . Tetanus 12/01/2017  . Zoster Recombinat (Shingrix) 08/24/2018, 02/22/2020   Pertinent  Health Maintenance Due  Topic Date Due  . HEMOGLOBIN A1C  12/01/2019  . OPHTHALMOLOGY EXAM  05/30/2021  . INFLUENZA VACCINE  08/12/2021  . FOOT EXAM  10/17/2021  . DEXA SCAN  Completed      10/17/2020    2:53 PM 02/07/2021   12:47 PM 06/19/2021   12:11 AM 09/11/2021    3:39 PM 12/04/2021    4:51 AM  Fall Risk  Falls in the past year? 0 1  0   Was there an injury with Fall? 0 0  0   Fall Risk Category Calculator 0 1  0   Fall Risk Category Low Low  Low   Patient Fall Risk Level Low fall risk Moderate fall risk Moderate fall risk Low fall risk High fall risk  Patient at Risk for Falls Due to No Fall Risks History of fall(s);Impaired balance/gait;Impaired mobility;Orthopedic patient  No Fall Risks   Patient at Risk for Falls Due to - Comments  spinal stenosis of lumbar     Fall risk Follow  up Falls evaluation completed Falls evaluation completed;Education provided;Falls prevention discussed  Falls evaluation completed    Functional Status Survey:    Vitals:   12/08/21 1624  BP: 137/71  Pulse: 72  Resp: 16  Temp: (!) 97.5 F (36.4 C)  SpO2: 94%  Weight: 187 lb (84.8 kg)  Height: '5\' 8"'$  (1.727 m)   Body mass index is 28.43 kg/m. Physical Exam  Labs reviewed: Recent Labs    06/19/21 0006 06/19/21 0010 08/26/21 0000 12/04/21 0641  NA 140 141 141 138  K 4.1 4.0 4.1 4.2  CL 104 103 103 105  CO2 28  --  29* 25  GLUCOSE 108* 106*  --  112*  BUN '14 17 17 15  '$ CREATININE 0.85 0.80 0.9 0.84  CALCIUM 9.8  --  10.0 9.7  MG  --   --   --  2.1    Recent Labs    03/26/21 0000 12/04/21 0641  AST 15 20  ALT 12 16  ALKPHOS 47 54  BILITOT  --  0.9  PROT  --  7.4  ALBUMIN 3.8 3.6    Recent Labs    03/26/21 0000 05/01/21 0000 06/19/21 0006 06/19/21 0010 12/04/21 0641  WBC 6.5 6.9 7.2  --  8.7  NEUTROABS 3,627.00  --  3.5  --  5.3  HGB 11.9* 11.7* 13.2 13.9 12.9  HCT 35* 35* 41.8 41.0 39.4  MCV  --   --  105.0*  --  102.1*  PLT 226 221 216  --  249    Lab Results  Component Value Date   TSH 1.390 07/03/2021   Lab Results  Component Value Date   HGBA1C 5.9 (H) 05/31/2019   Lab Results  Component Value Date   CHOL 175 07/03/2021   HDL 59 07/03/2021   LDLCALC 91 07/03/2021   TRIG 146 07/03/2021   CHOLHDL 2.4 04/05/2020    Significant Diagnostic Results in last 30 days:  CT ABDOMEN PELVIS W CONTRAST  Result Date: 12/04/2021 CLINICAL DATA:  Left lower quadrant abdominal pain. EXAM: CT ABDOMEN AND PELVIS WITH CONTRAST TECHNIQUE: Multidetector CT imaging of the abdomen and pelvis was performed using the standard protocol following bolus administration of intravenous  contrast. RADIATION DOSE REDUCTION: This exam was performed according to the departmental dose-optimization program which includes automated exposure control, adjustment of the mA  and/or kV according to patient size and/or use of iterative reconstruction technique. CONTRAST:  162m OMNIPAQUE IOHEXOL 300 MG/ML  SOLN COMPARISON:  None Available. FINDINGS: Lower chest: Patchy airspace opacities are present at the lung bases, left greater than right. No significant effusions are present. Hepatobiliary: Liver is unremarkable. Layering gallstones are present without inflammatory change. The common bile duct is within normal limits for age. Pancreas: Unremarkable. No pancreatic ductal dilatation or surrounding inflammatory changes. Spleen: Normal in size without focal abnormality. Adrenals/Urinary Tract: The adrenal glands are within normal limits bilaterally. An 8.8 cm simple exophytic cyst emanates from the posterior aspect of the left kidney. Kidneys are otherwise unremarkable. No parenchymal lesion or stone is present. No obstruction is present. Ureters are within normal limits. The urinary bladder is normal. Stomach/Bowel: The stomach is within normal limits. Duodenal ulcer noted without inflammatory change. Small bowel is un remarkable proximally. Distal small bowel herniates into the right inguinal canal without obstruction. Terminal ileum is within normal limits. Appendix is visualized and within normal limits. Ascending colon is normal. Moderate stool is present throughout the transverse colon without obstruction. No inflammation is present. Descending colon is normal. Sigmoid colon is somewhat redundant without focal inflammation or obstruction. Vascular/Lymphatic: Atherosclerotic calcifications are present in the aorta and branch vessels. No aneurysm is present. No significant adenopathy is present. Reproductive: Status post hysterectomy. No adnexal masses. Other: Bilateral paraumbilical hernias contain fat without bowel. Right inguinal hernia contains a loop of small bowel as described above. A left paramedian ventral hernia anterior to the stomach contains fat without bowel. The opening  measures 6 mm. No free fluid or free air is present. Musculoskeletal: Grade 1 degenerative anterolisthesis is present at L4-5 and L5-S1. An inferior endplate fracture at L2 is age indeterminate. No other acute fractures are present. Bony pelvis is within normal limits. The hips are located and within normal limits. IMPRESSION: 1. No acute or focal lesion to explain the patient's left lower quadrant abdominal pain. 2. Right inguinal hernia contains a loop of small bowel without obstruction. 3. Bilateral paraumbilical hernias contain fat without bowel. 4. Left paramedian ventral hernia anterior to the stomach contains fat without bowel. 5. Cholelithiasis without evidence of cholecystitis. 6. Patchy airspace opacities at the lung bases, left greater than right. This may represent atelectasis or infection. 7. Age indeterminate inferior endplate fracture at L2. 8. Grade 1 degenerative anterolisthesis at L4-5 and L5-S1. 9. 8.8 cm simple exophytic cyst emanates from the posterior aspect of the left kidney. No follow-up imaging is recommended. JACR 2018 Feb; 264-273, Management of the Incidental Renal Mass on CT, [employer] 2021; 814-848, Bosniak Classification of Cystic Renal Masses, Version 2019. 10.  Aortic Atherosclerosis (ICD10-I70.0). Electronically Signed   By: CSan MorelleM.D.   On: 12/04/2021 08:39   DG Shoulder Right  Result Date: 12/04/2021 CLINICAL DATA:  Persistent shoulder pain after falling several months previously EXAM: RIGHT SHOULDER - 2+ VIEW COMPARISON:  Prior chest x-ray 04/23/2019 FINDINGS: No acute fracture or malalignment. Degenerative osteoarthritis is present at the acromioclavicular joint. Rounded calcification overlies the right chest. This may be within the breast, or the lung but is unchanged compared to prior imaging from 21610and almost certainly benign. Chronic bronchitic changes visualized in the lung. Mild degenerative changes at the rotator cuff insertion. IMPRESSION: 1.  Degenerative osteoarthritis at the acromioclavicular joint. 2. Degenerative changes  at the rotator cuff insertion. 3. No acute fracture or malalignment. Electronically Signed   By: Jacqulynn Cadet M.D.   On: 12/04/2021 07:14    Assessment/Plan There are no diagnoses linked to this encounter.   Family/ staff Communication: ***  Labs/tests ordered:  ***  This encounter was created in error - please disregard. This encounter was created in error - please disregard.

## 2021-12-08 NOTE — Telephone Encounter (Signed)
Below is ManX's reply:  Meet with Morey Hummingbird this morning, questions answered, plan of care made. Thanks.

## 2021-12-09 ENCOUNTER — Telehealth: Payer: Self-pay

## 2021-12-09 NOTE — Progress Notes (Signed)
This encounter was created in error - please disregard.

## 2021-12-09 NOTE — Progress Notes (Deleted)
This encounter was created in error - please disregard.

## 2021-12-09 NOTE — Telephone Encounter (Signed)
Pharmacy send a rx for Lorazepam 0.5 mg and medication not on active medication list. Please advise.

## 2021-12-10 DIAGNOSIS — R29898 Other symptoms and signs involving the musculoskeletal system: Secondary | ICD-10-CM | POA: Diagnosis not present

## 2021-12-10 DIAGNOSIS — M25511 Pain in right shoulder: Secondary | ICD-10-CM | POA: Diagnosis not present

## 2021-12-10 DIAGNOSIS — M6281 Muscle weakness (generalized): Secondary | ICD-10-CM | POA: Diagnosis not present

## 2021-12-11 DIAGNOSIS — R29898 Other symptoms and signs involving the musculoskeletal system: Secondary | ICD-10-CM | POA: Diagnosis not present

## 2021-12-11 DIAGNOSIS — M25511 Pain in right shoulder: Secondary | ICD-10-CM | POA: Diagnosis not present

## 2021-12-11 DIAGNOSIS — M6281 Muscle weakness (generalized): Secondary | ICD-10-CM | POA: Diagnosis not present

## 2021-12-15 ENCOUNTER — Encounter: Payer: Self-pay | Admitting: Nurse Practitioner

## 2021-12-15 ENCOUNTER — Non-Acute Institutional Stay (SKILLED_NURSING_FACILITY): Payer: Medicare Other | Admitting: Nurse Practitioner

## 2021-12-15 DIAGNOSIS — K219 Gastro-esophageal reflux disease without esophagitis: Secondary | ICD-10-CM

## 2021-12-15 DIAGNOSIS — G459 Transient cerebral ischemic attack, unspecified: Secondary | ICD-10-CM

## 2021-12-15 DIAGNOSIS — K5901 Slow transit constipation: Secondary | ICD-10-CM

## 2021-12-15 DIAGNOSIS — F5105 Insomnia due to other mental disorder: Secondary | ICD-10-CM

## 2021-12-15 DIAGNOSIS — I480 Paroxysmal atrial fibrillation: Secondary | ICD-10-CM

## 2021-12-15 DIAGNOSIS — M159 Polyosteoarthritis, unspecified: Secondary | ICD-10-CM | POA: Diagnosis not present

## 2021-12-15 DIAGNOSIS — I639 Cerebral infarction, unspecified: Secondary | ICD-10-CM

## 2021-12-15 DIAGNOSIS — I5032 Chronic diastolic (congestive) heart failure: Secondary | ICD-10-CM

## 2021-12-15 DIAGNOSIS — I10 Essential (primary) hypertension: Secondary | ICD-10-CM

## 2021-12-15 DIAGNOSIS — N183 Chronic kidney disease, stage 3 unspecified: Secondary | ICD-10-CM | POA: Diagnosis not present

## 2021-12-15 DIAGNOSIS — R29898 Other symptoms and signs involving the musculoskeletal system: Secondary | ICD-10-CM | POA: Diagnosis not present

## 2021-12-15 DIAGNOSIS — M6281 Muscle weakness (generalized): Secondary | ICD-10-CM | POA: Diagnosis not present

## 2021-12-15 DIAGNOSIS — F01B3 Vascular dementia, moderate, with mood disturbance: Secondary | ICD-10-CM | POA: Diagnosis not present

## 2021-12-15 DIAGNOSIS — M25511 Pain in right shoulder: Secondary | ICD-10-CM | POA: Diagnosis not present

## 2021-12-15 DIAGNOSIS — I25709 Atherosclerosis of coronary artery bypass graft(s), unspecified, with unspecified angina pectoris: Secondary | ICD-10-CM | POA: Diagnosis not present

## 2021-12-15 DIAGNOSIS — F418 Other specified anxiety disorders: Secondary | ICD-10-CM

## 2021-12-15 NOTE — Assessment & Plan Note (Signed)
Blood pressure is controlled,  takes Losartan, Metoprolol.

## 2021-12-15 NOTE — Assessment & Plan Note (Signed)
takes Metoprolol, Eliquis, Amiodarone, f/u cardiology. TSH 1.39 07/03/21

## 2021-12-15 NOTE — Assessment & Plan Note (Signed)
Neurology: cerebral infarction,MRI chronic right cerebellar stroke, mild to moderate generalized atrophy, small vessel disease with multiple chronic lacunar infarcts, possible left sided stroke not seen on imaging-saw 2 fingers in front of her upon my examination,  numbness in right side of tongue, R sided deficits,  transient vision disturbance of R eye.

## 2021-12-15 NOTE — Assessment & Plan Note (Signed)
Bun/creat 15/0.84 12/04/21

## 2021-12-15 NOTE — Assessment & Plan Note (Addendum)
ED eval 12/04/21 for pain R shoulder-X-ray showed no acute fx, L hip, LLQ pain-CT abd identified inguinal hernia w/o obstruction or gangrene-no further c/o abd pain. OA pain is managed with Norco. HPOA daughter desires comfort measures presently.  left knee s/p TKR, MR lumbar, severe spinal 11/13/20 spinal and foraminal stenosis with left sciatica, taking Tramadol, Norco,  Methocarbamol.

## 2021-12-15 NOTE — Assessment & Plan Note (Signed)
stable, on Senna, MiraLax.

## 2021-12-15 NOTE — Assessment & Plan Note (Signed)
stable, takes Omeprazole, Hgb 12.9 12/04/21

## 2021-12-15 NOTE — Assessment & Plan Note (Signed)
stable, Hx of CABG Cath 04/25/19, prn NTG, takes Atorvastatin. LDL 74 04/05/21

## 2021-12-15 NOTE — Assessment & Plan Note (Signed)
noted increased swelling in legs, weight gained about #2-3 Ibs in the past week, no cough or O2 desaturation noted, she is afebrile.  BLE edema, worsened, EF 60% 08/03/21, off Furosemide-needs resume Furosemide. Furosemide '20mg'$  Kcl 64mq po daily, update BMP one week, weekly weight.

## 2021-12-15 NOTE — Assessment & Plan Note (Signed)
not using CPAP, sleeping better             Depression, better emotional/teary,  off Sertraline 2/2 confusion. Stable, sleeps better, on Lexapro '10mg'$  qd, prn Lorazepam

## 2021-12-15 NOTE — Assessment & Plan Note (Signed)
MMSE 27/30, SNF for supportive care. Vit B12 324 07/03/21, on Vit B12 1049mg qd. update MMSE, obtain MOST

## 2021-12-15 NOTE — Assessment & Plan Note (Signed)
stable, on Eliquis, Atorvastatin.

## 2021-12-15 NOTE — Progress Notes (Signed)
Location:   S[facility] [place] Room Number: 32 Place of Service:  SNF (31) Provider: Lennie Odor Dniyah Grant NP  Curtisha Bendix X, NP  Patient Care Team: [name], NP as PCP - General (Internal Medicine) [name], MD as PCP - Cardiology (Cardiology) Briann Sarchet X, NP as Nurse Practitioner (Internal Medicine) Lavonna Monarch, MD (Inactive) as Consulting Physician (Dermatology)  Extended Emergency Contact Information Primary Emergency Contact: [place] Mobile Phone: [phone] Relation: Daughter Secondary Emergency Contact: Weeks,Tiffany Mobile Phone: [phone] Relation: Daughter Preferred language: English Interpreter needed? No  Code Status:  DNR Goals of care: Advanced Directive information    12/04/2021    4:50 AM  Advanced Directives  Does Patient Have a Medical Advance Directive? No  Would patient like information on creating a medical advance directive? No - Patient declined     Chief Complaint  Patient presents with   Acute Visit    Swelling in legs.     HPI:  Pt is a 88 y.o. female seen today for noted increased swelling in legs, weight gained about #2-3 Ibs in the past week, no cough or O2 desaturation noted, she is afebrile.               ED eval 12/04/21 for pain R shoulder-X-ray showed no acute fx, L hip, LLQ pain-CT abd identified inguinal he nia w/o obstruction or gangrene-no further c/o abd pain. OA pain is managed with Norco. HPOA daughter desires comfort measures presently.              TIA, stable, on Eliquis, Atorvastatin.  CVA, Neurology: cerebral infarction,MRI chronic right cerebellar stroke, mild to moderate generalized atrophy, small vessel disease with multiple chronic lacunar infarcts, possible left sided stroke not seen on imaging-saw 2 fingers in front of her upon my examination,  numbness in right side of tongue, R sided deficits,  transient vision disturbance of R eye.  OA, left knee s/p TKR, MR lumbar, severe spinal 11/13/20 spinal and  foraminal stenosis with left sciatica, taking Tramadol, Norco,  Methocarbamol.              Constipation, stable, on Senna, MiraLax.              Afib, takes Metoprolol, Eliquis, Amiodarone, f/u cardiology. TSH 1.39 07/03/21             HTN, takes Losartan, Metoprolol.              GERD, stable, takes Omeprazole, Hgb 12.9 12/04/21             CAD, stable, Hx of CABG Cath 04/25/19, prn NTG, takes Atorvastatin. LDL 74 04/05/21             CKD Bun/creat 15/0.84 12/04/21             Insomnia/OSA, not using CPAP, sleeping better, Lorazepam helps.              Depression, better emotional/teary,  off Sertraline 2/2 confusion. Stable, sleeps better, on Lexapro '10mg'$  qd, prn Lorazepam.              Vascular cognitive impairment, MMSE 27/30, SNF for supportive care. Vit B12 324 07/03/21, on Vit B12 1067mg qd.              BLE edema, EF 60% 08/03/21, off Furosemide-needs resume Furosemide.   Past Medical History:  Diagnosis Date   Atherosclerosis of native coronary artery 2001   LAD & Cx disease -- BMS PCI in 2001;  CABG 2002.  PCI in 2006 (per-op [facility])   Atrial fibrillation and flutter ([place]) 04/2019   New diagnosis-major complaint was chest tightness and dyspnea.   Breast cancer ([place])    Cervix dysplasia    GERD (gastroesophageal reflux disease)    Heart attack ([place]) 2002, 2006   Both events reportedly were perioperatively. She has no recollection. Initial PCI was related to angina symptoms (bilateral arm pain)   High cholesterol    Hypertension    Lumbosacral spinal stenosis    Osteoarthritis    Skin cancer    Sleep apnea    Uses CPAP faithfully   Squamous cell carcinoma of skin 02/12/2020   in situ- left neck-anterior ([id number])   Past Surgical History:  Procedure Laterality Date   ABDOMINAL HYSTERECTOMY     bone density     CARDIAC CATHETERIZATION  2001    Dr. Glade Lloyd [facility]) 2 vessel CAD involving LAD-D1 and distal Cx-OM. Appearance of dilated tapered left main with no significant  atherosclerosis on IVUS --  BMS PCI of pLAD ([facility] BMS 3.0 mm x 15 mm) & with PTCA of ostial D1, PTCA of dLAD.  BMS PCI pCx [facility] BMS 3.5 mm x 11 mm) - unable to reopen dCx-OM (probable distal Atheroembolic occlusion.  Normal LVEF.   COLONOSCOPY     CORONARY ARTERY BYPASS GRAFT  2002   Unsure of details [facility], in [facility])   [place]  2001   ([place]) [facility] BMS PCI pLAD (3.0 mm x 15 mm) -PTCA of jailed D1 as well as distal LAD; pCx ([facility] BMS 3.5 mm x 11 mm) -unable to open distalCx-OM -thromboembolic occlusion   DIAGNOSTIC MAMMOGRAM     LEFT HEART CATH AND CORS/GRAFTS ANGIOGRAPHY N/A 04/25/2019   Procedure: LEFT HEART CATH AND CORS/GRAFTS ANGIOGRAPHY;  Surgeon: Troy Sine, MD;  Location: [facility] INVASIVE CV LAB;;; ost-prox LCx stent @ OM1 ostium =100% CTO, Ost-prox LAD stent ~30% ISR, Ost D1 CTO, prox RCA ~30%. SCG-OM2 patent with ostial stent 30%, LIMA-LAD patent; CTO of SVG-D1.    left knee replacement     MASTECTOMY     NM MYOVIEW LTD  12/2014; 2018   Cardiologist (Dr. Kellie Simmering.  [place], [place] ph# [phone]) -->LEXISCAN: Nonischemic, "normal "   pap smear     tonsilectomy     TRANSTHORACIC ECHOCARDIOGRAM  12/2014   Echocardiogram 12/25/14- normal LVEF and systolic function, EF 03-47%, mild grade 1 diastolic dysfunction   TRANSTHORACIC ECHOCARDIOGRAM  04/24/2019   EF 65 to 70%.  Moderate septal LVH.  GR 1 DD-elevated EDP.  Normal PAP.  Relatively normal valves.  No aortic stenosis.    Allergies  Allergen Reactions   Codeine Nausea Only    Allergies as of 12/15/2021       Reactions   Codeine Nausea Only        Medication List        Accurate as of December 15, 2021  1:21 PM. If you have any questions, ask your nurse or doctor.          acetaminophen 500 MG tablet Commonly known as: TYLENOL Take 2 tablets (1,000 mg total) by mouth 2 (two) times daily.   amiodarone 100 MG tablet Commonly known as: PACERONE Take 100 mg by mouth daily.    AMLACTIN EX thin layer topically; topical Once A Day - PRN Special Instructions: May apply topically once daily PRN for dry skin.   apixaban 5 MG Tabs tablet Commonly known as:  Eliquis Take 1 tablet (5 mg total) by mouth 2 (two) times daily.   atorvastatin 80 MG tablet Commonly known as: LIPITOR Take 1 tablet (80 mg total) by mouth daily.   cyanocobalamin 1000 MCG tablet Commonly known as: VITAMIN B12 Take 1,000 mcg by mouth daily.   docusate sodium 100 MG capsule Commonly known as: COLACE Take 1 capsule (100 mg total) by mouth daily.   escitalopram 10 MG tablet Commonly known as: Lexapro Take 1 tablet (10 mg total) by mouth daily.   fexofenadine 180 MG tablet Commonly known as: ALLEGRA Take 1 tablet (180 mg total) by mouth daily as needed for allergies or rhinitis.   fluticasone 50 MCG/ACT nasal spray Commonly known as: FLONASE Place 1 spray into both nostrils daily as needed for allergies or rhinitis.   HYDROcodone-acetaminophen 5-325 MG tablet Commonly known as: NORCO/VICODIN Take 1 tablet by mouth every 4 (four) hours as needed.   losartan 25 MG tablet Commonly known as: COZAAR Take 25 mg by mouth daily.   melatonin 5 MG Tabs Take 5 mg by mouth at bedtime.   methocarbamol 500 MG tablet Commonly known as: ROBAXIN Take 250 mg by mouth as needed.   metoprolol succinate 50 MG 24 hr tablet Commonly known as: TOPROL-XL Take 1 tablet (50 mg total) by mouth daily. Take with or immediately following a meal.   nitroGLYCERIN 0.4 MG SL tablet Commonly known as: NITROSTAT Place 0.4 mg under the tongue as needed for chest pain (Every 5 minutes x 3 doses).   nystatin powder Commonly known as: MYCOSTATIN/NYSTOP Apply 1 Application topically 2 (two) times daily.   omeprazole 20 MG tablet Commonly known as: PRILOSEC OTC Take 1 tablet (20 mg total) by mouth daily as needed.   polyethylene glycol 17 g packet Commonly known as: MIRALAX / GLYCOLAX Take 17 g by mouth  daily. Give with 4 oz of water.   Salonpas Pain Relieving 4 % Generic drug: lidocaine Place 1 patch onto the skin daily.   senna-docusate 8.6-50 MG tablet Commonly known as: Senokot-S Take 2 tablets by mouth at bedtime.   traMADol 50 MG tablet Commonly known as: Ultram Take 1 tablet (50 mg total) by mouth every 6 (six) hours as needed.   traMADol 50 MG tablet Commonly known as: ULTRAM Take 0.5 tablets (25 mg total) by mouth at bedtime.   Vitamin D3 50 MCG (2000 UT) Tabs Take 2,000 Units by mouth daily after breakfast.   zinc oxide 20 % ointment Apply 1 application topically 3 (three) times daily as needed for irritation. Apply to buttocks after every incontinent episode and as needed for redness.        Review of Systems  Constitutional:  Negative for activity change, appetite change and fever.  HENT:  Negative for congestion, hearing loss and voice change.   Eyes:  Negative for visual disturbance.       C/o eye sight issue upon first awake in am, mostly R eye, better as day goes on.   Respiratory:  Negative for cough, chest tightness, shortness of breath and wheezing.   Cardiovascular:  Positive for leg swelling.  Gastrointestinal:  Negative for abdominal pain, blood in stool, constipation, nausea and vomiting.  Genitourinary:  Negative for dysuria and urgency.  Musculoskeletal:  Positive for arthralgias, back pain and gait problem.       S/p L TKR,  left hip pain is better, right shoulder pain as well.   Skin:  Negative for color change.  Neurological:  Positive for numbness. Negative for speech difficulty and headaches.       Memory lapses. C/o numbness right side of tongue.   Hematological:  Does not bruise/bleed easily.  Psychiatric/Behavioral:  Positive for sleep disturbance. Negative for behavioral problems and dysphoric mood. The patient is nervous/anxious.        Better    Immunization History  Administered Date(s) Administered   Fluad Quad(high  Dose 65+) 10/28/2020   Influenza, High Dose Seasonal PF 10/21/2016, 09/24/2017, 10/25/2019   Influenza-Unspecified 10/03/2011, 10/04/2012, 01/13/2016   Moderna SARS-COV2 Booster Vaccination 11/21/2019, 06/11/2020, 05/30/2021   Moderna Sars-Covid-2 Vaccination 01/16/2019, 02/13/2019, 03/13/2019   PNEUMOCOCCAL CONJUGATE-20 07/03/2021   Pneumococcal-Unspecified 01/13/2016   Tetanus 12/01/2017   Zoster Recombinat (Shingrix) 08/24/2018, 02/22/2020   Pertinent  Health Maintenance Due  Topic Date Due   HEMOGLOBIN A1C  12/01/2019   OPHTHALMOLOGY EXAM  05/30/2021   INFLUENZA VACCINE  08/12/2021   FOOT EXAM  10/17/2021   DEXA SCAN  Completed      10/17/2020    2:53 PM 02/07/2021   12:47 PM 06/19/2021   12:11 AM 09/11/2021    3:39 PM 12/04/2021    4:51 AM  Fall Risk  Falls in the past year? 0 1  0   Was there an injury with Fall? 0 0  0   Fall Risk Category Calculator 0 1  0   Fall Risk Category Low Low  Low   Patient Fall Risk Level Low fall risk Moderate fall risk Moderate fall risk Low fall risk High fall risk  Patient at Risk for Falls Due to No Fall Risks History of fall(s);Impaired balance/gait;Impaired mobility;Orthopedic patient  No Fall Risks   Patient at Risk for Falls Due to - Comments  spinal stenosis of lumbar     Fall risk Follow up Falls evaluation completed Falls evaluation completed;Education provided;Falls prevention discussed  Falls evaluation completed    Functional Status Survey:    Vitals:   12/15/21 1245  BP: 131/71  Pulse: 72  Resp: 16  Temp: (!) 97.5 F (36.4 C)  SpO2: 94%  Weight: 189 lb 9.6 oz (86 kg)   Body mass index is 28.83 kg/m. Physical Exam Constitutional:      Appearance: Normal appearance.  HENT:     Head: Normocephalic and atraumatic.     Nose: Nose normal.     Mouth/Throat:     Mouth: Mucous membranes are moist.  Eyes:     Extraocular Movements: Extraocular movements intact.     Pupils: Pupils are equal, round, and reactive to light.   Cardiovascular:     Rate and Rhythm: Normal rate and regular rhythm.     Heart sounds: Murmur heard.  Pulmonary:     Effort: Pulmonary effort is normal.     Breath sounds: Rales present.     Comments: Bibasilar rales present Abdominal:     General: Bowel sounds are normal.     Palpations: Abdomen is soft.     Tenderness: There is no abdominal tenderness. There is no right CVA tenderness, left CVA tenderness, guarding or rebound.     Hernia: A hernia is present.     Comments: R inguinal hernia per CT abd.   Musculoskeletal:     Cervical back: Normal range of motion and neck supple.     Right lower leg: Edema present.     Left lower leg: Edema present.     Comments: 3+ edema BLE. Slightly R arm weakness. C/o right shoulder/arm  aches. No decreased ROM  Skin:    General: Skin is warm and dry.     Comments: From previous exam: BLE cool to touch, dependent rubor noted. Mild venous insufficiency skin changes above ankles.  S/p right mastectomy.   Neurological:     General: No focal deficit present.     Mental Status: She is alert and oriented to person, place, and time. Mental status is at baseline.     Motor: Weakness present.     Coordination: Coordination normal.     Gait: Gait abnormal.     Comments: C/o numbness of the right side of tongue. Saw 2 fingers in front of her upon my examination left eye.   Psychiatric:        Mood and Affect: Mood normal.        Behavior: Behavior normal.        Thought Content: Thought content normal.     Labs reviewed: Recent Labs    06/19/21 0006 06/19/21 0010 08/26/21 0000 12/04/21 0641  NA 140 141 141 138  K 4.1 4.0 4.1 4.2  CL 104 103 103 105  CO2 28  --  29* 25  GLUCOSE 108* 106*  --  112*  BUN '14 17 17 15  '$ CREATININE 0.85 0.80 0.9 0.84  CALCIUM 9.8  --  10.0 9.7  MG  --   --   --  2.1   Recent Labs    03/26/21 0000 12/04/21 0641  AST 15 20  ALT 12 16  ALKPHOS 47 54  BILITOT  --  0.9  PROT  --  7.4  ALBUMIN 3.8 3.6    Recent Labs    03/26/21 0000 05/01/21 0000 06/19/21 0006 06/19/21 0010 12/04/21 0641  WBC 6.5 6.9 7.2  --  8.7  NEUTROABS 3,627.00  --  3.5  --  5.3  HGB 11.9* 11.7* 13.2 13.9 12.9  HCT 35* 35* 41.8 41.0 39.4  MCV  --   --  105.0*  --  102.1*  PLT 226 221 216  --  249   Lab Results  Component Value Date   TSH 1.390 07/03/2021   Lab Results  Component Value Date   HGBA1C 5.9 (H) 05/31/2019   Lab Results  Component Value Date   CHOL 175 07/03/2021   HDL 59 07/03/2021   LDLCALC 91 07/03/2021   TRIG 146 07/03/2021   CHOLHDL 2.4 04/05/2020    Significant Diagnostic Results in last 30 days:  CT ABDOMEN PELVIS W CONTRAST  Result Date: 12/04/2021 CLINICAL DATA:  Left lower quadrant abdominal pain. EXAM: CT ABDOMEN AND PELVIS WITH CONTRAST TECHNIQUE: Multidetector CT imaging of the abdomen and pelvis was performed using the standard protocol following bolus administration of intravenous contrast. RADIATION DOSE REDUCTION: This exam was performed according to the departmental dose-optimization program which includes automated exposure control, adjustment of the mA and/or kV according to patient size and/or use of iterative reconstruction technique. CONTRAST:  199m OMNIPAQUE IOHEXOL 300 MG/ML  SOLN COMPARISON:  None Available. FINDINGS: Lower chest: Patchy airspace opacities are present at the lung bases, left greater than right. No significant effusions are present. Hepatobiliary: Liver is unremarkable. Layering gallstones are present without inflammatory change. The common bile duct is within normal limits for age. Pancreas: Unremarkable. No pancreatic ductal dilatation or surrounding inflammatory changes. Spleen: Normal in size without focal abnormality. Adrenals/Urinary Tract: The adrenal glands are within normal limits bilaterally. An 8.8 cm simple exophytic cyst emanates from the posterior aspect of the  left kidney. Kidneys are otherwise unremarkable. No parenchymal lesion or stone  is present. No obstruction is present. Ureters are within normal limits. The urinary bladder is normal. Stomach/Bowel: The stomach is within normal limits. Duodenal ulcer noted without inflammatory change. Small bowel is un remarkable proximally. Distal small bowel herniates into the right inguinal canal without obstruction. Terminal ileum is within normal limits. Appendix is visualized and within normal limits. Ascending colon is normal. Moderate stool is present throughout the transverse colon without obstruction. No inflammation is present. Descending colon is normal. Sigmoid colon is somewhat redundant without focal inflammation or obstruction. Vascular/Lymphatic: Atherosclerotic calcifications are present in the aorta and branch vessels. No aneurysm is present. No significant adenopathy is present. Reproductive: Status post hysterectomy. No adnexal masses. Other: Bilateral paraumbilical hernias contain fat without bowel. Right inguinal hernia contains a loop of small bowel as described above. A left paramedian ventral hernia anterior to the stomach contains fat without bowel. The opening measures 6 mm. No free fluid or free air is present. Musculoskeletal: Grade 1 degenerative anterolisthesis is present at L4-5 and L5-S1. An inferior endplate fracture at L2 is age indeterminate. No other acute fractures are present. Bony pelvis is within normal limits. The hips are located and within normal limits. IMPRESSION: 1. No acute or focal lesion to explain the patient's left lower quadrant abdominal pain. 2. Right inguinal hernia contains a loop of small bowel without obstruction. 3. Bilateral paraumbilical hernias contain fat without bowel. 4. Left paramedian ventral hernia anterior to the stomach contains fat without bowel. 5. Cholelithiasis without evidence of cholecystitis. 6. Patchy airspace opacities at the lung bases, left greater than right. This may represent atelectasis or infection. 7. Age indeterminate  inferior endplate fracture at L2. 8. Grade 1 degenerative anterolisthesis at L4-5 and L5-S1. 9. 8.8 cm simple exophytic cyst emanates from the posterior aspect of the left kidney. No follow-up imaging is recommended. JACR 2018 Feb; 264-273, Management of the Incidental Renal Mass on CT, [employer] 2021; 814-848, Bosniak Classification of Cystic Renal Masses, Version 2019. 10.  Aortic Atherosclerosis (ICD10-I70.0). Electronically Signed   By: San Morelle M.D.   On: 12/04/2021 08:39   DG Shoulder Right  Result Date: 12/04/2021 CLINICAL DATA:  Persistent shoulder pain after falling several months previously EXAM: RIGHT SHOULDER - 2+ VIEW COMPARISON:  Prior chest x-ray 04/23/2019 FINDINGS: No acute fracture or malalignment. Degenerative osteoarthritis is present at the acromioclavicular joint. Rounded calcification overlies the right chest. This may be within the breast, or the lung but is unchanged compared to prior imaging from 2595 and almost certainly benign. Chronic bronchitic changes visualized in the lung. Mild degenerative changes at the rotator cuff insertion. IMPRESSION: 1. Degenerative osteoarthritis at the acromioclavicular joint. 2. Degenerative changes at the rotator cuff insertion. 3. No acute fracture or malalignment. Electronically Signed   By: Jacqulynn Cadet M.D.   On: 12/04/2021 07:14    Assessment/Plan  (HFpEF) heart failure with preserved ejection fraction ([place]) noted increased swelling in legs, weight gained about #2-3 Ibs in the past week, no cough or O2 desaturation noted, she is afebrile.  BLE edema, worsened, EF 60% 08/03/21, off Furosemide-needs resume Furosemide. Furosemide '20mg'$  Kcl 15mq po daily, update BMP one week, weekly weight.    Osteoarthritis of multiple joints ED eval 12/04/21 for pain R shoulder-X-ray showed no acute fx, L hip, LLQ pain-CT abd identified inguinal hernia w/o obstruction or gangrene-no further c/o abd pain. OA pain is managed with Norco.  HPOA daughter desires comfort measures presently.  left knee s/p TKR, MR lumbar, severe spinal 11/13/20 spinal and foraminal stenosis with left sciatica, taking Tramadol, Norco,  Methocarbamol.   TIA (transient ischemic attack) stable, on Eliquis, Atorvastatin.   CVA (cerebral vascular accident) [facility])  Neurology: cerebral infarction,MRI chronic right cerebellar stroke, mild to moderate generalized atrophy, small vessel disease with multiple chronic lacunar infarcts, possible left sided stroke not seen on imaging-saw 2 fingers in front of her upon my examination,  numbness in right side of tongue, R sided deficits,  transient vision disturbance of R eye.   Constipation stable, on Senna, MiraLax.   Paroxysmal atrial fibrillation/flutter (HCC)  takes Metoprolol, Eliquis, Amiodarone, f/u cardiology. TSH 1.39 07/03/21  Essential hypertension Blood pressure is controlled,  takes Losartan, Metoprolol.   Gastroesophageal reflux disease  stable, takes Omeprazole, Hgb 12.9 12/04/21  Coronary artery disease involving coronary bypass graft of native heart with angina pectoris (HCC) stable, Hx of CABG Cath 04/25/19, prn NTG, takes Atorvastatin. LDL 74 04/05/21  CKD (chronic kidney disease) stage 3, GFR 30-59 ml/min (HCC) Bun/creat 15/0.84 12/04/21  Insomnia secondary to depression with anxiety not using CPAP, sleeping better             Depression, better emotional/teary,  off Sertraline 2/2 confusion. Stable, sleeps better, on Lexapro '10mg'$  qd, prn Lorazepam  Vascular dementia (HCC)  MMSE 27/30, SNF for supportive care. Vit B12 324 07/03/21, on Vit B12 1046mg qd. update MMSE, obtain MOST    Family/ staff Communication: plan of care reviewed with the patient and charge nurse.   Labs/tests ordered:  BMP one week.   Time spend 35 minutes.

## 2021-12-17 ENCOUNTER — Encounter: Payer: Self-pay | Admitting: Adult Health

## 2021-12-17 ENCOUNTER — Telehealth: Payer: Self-pay

## 2021-12-17 ENCOUNTER — Non-Acute Institutional Stay (SKILLED_NURSING_FACILITY): Payer: Medicare Other | Admitting: Adult Health

## 2021-12-17 ENCOUNTER — Other Ambulatory Visit: Payer: Self-pay | Admitting: Adult Health

## 2021-12-17 DIAGNOSIS — G8929 Other chronic pain: Secondary | ICD-10-CM

## 2021-12-17 DIAGNOSIS — F01A3 Vascular dementia, mild, with mood disturbance: Secondary | ICD-10-CM

## 2021-12-17 DIAGNOSIS — M25552 Pain in left hip: Secondary | ICD-10-CM

## 2021-12-17 DIAGNOSIS — F339 Major depressive disorder, recurrent, unspecified: Secondary | ICD-10-CM

## 2021-12-17 MED ORDER — DULOXETINE HCL 30 MG PO CPEP: 30.0000 mg | ORAL_CAPSULE | Freq: Every day | ORAL | 3 refills | Status: DC

## 2021-12-17 MED ORDER — ESCITALOPRAM OXALATE 5 MG PO TABS: 5.0000 mg | ORAL_TABLET | Freq: Every day | ORAL | 3 refills | Status: AC

## 2021-12-17 MED ORDER — HYDROCODONE-ACETAMINOPHEN 5-325 MG PO TABS: 1.0000 | ORAL_TABLET | Freq: Four times a day (QID) | ORAL | 0 refills | Status: DC

## 2021-12-17 NOTE — Telephone Encounter (Signed)
Completed and placed in outbox.  Has been stable over the past 6 months from a stroke standpoint, cleared to hold Eliquis for 2 to 3 days prior to procedure with small acceptable risk of preprocedural stroke while off therapy and recommend restarting immediately after or once hemodynamically stable.  This clearance is from a neurological standpoint only.  Would recommend further clearance by cardiology if not already obtained as a manage Eliquis prescription.

## 2021-12-17 NOTE — Progress Notes (Unsigned)
Location:  [facility] Room Number: 40 Place of Service:  SNF (31) Provider:  Durenda Age, DNP, FNP-BC  Patient Care Team: Mast, [name], NP as PCP - General (Internal Medicine) Leonie Man, MD as PCP - Cardiology (Cardiology) Mast, [name], NP as Nurse Practitioner (Internal Medicine) Lavonna Monarch, MD (Inactive) as Consulting Physician (Dermatology)  Extended Emergency Contact Information Primary Emergency Contact: [place] Mobile Phone: [phone] Relation: Daughter Secondary Emergency Contact: Weeks,Tiffany Mobile Phone: [phone] Relation: Daughter Preferred language: English Interpreter needed? No  Code Status:  Full Code  Goals of care: Advanced Directive information    12/17/2021    4:09 PM  Advanced Directives  Does Patient Have a Medical Advance Directive? Yes  Type of Paramedic of [place];Living will;Out of facility DNR (pink MOST or yellow form)  Does patient want to make changes to medical advance directive? No - Patient declined  Copy of [place] in Chart? Yes - validated most recent copy scanned in chart (See row information)  Would patient like information on creating a medical advance directive? No - Patient declined  Pre-existing out of facility DNR order (yellow form or pink MOST form) Pink MOST form placed in chart (order not valid for inpatient use);Yellow form placed in chart (order not valid for inpatient use)     Chief Complaint  Patient presents with   Acute Visit    Acute Visit with the provider on site for a left hip pain.     HPI:  Pt is a 88 y.o. female seen today for an acute visit regarding left hip pain. She has followed up with orthopedics regarding the hip pain. Imaging done were negative for fracture. She currently takes Norco 5-325 mg 1 tab Q 6 hours and Tramadol 25 mg at bedtime for pain. MMSE administered today by charge nurse obtained 25/30,  ranging in mild cognitive impairment. She takes Lexapro 10 mg daily for depression.  She has a PMH of A-fib on Eliquis, heart failure, hypertension, CAD, OSA on CPAP, lumbar spinal stenosis and chronic kidney disease.  Past Medical History:  Diagnosis Date   Atherosclerosis of native coronary artery 2001   LAD & Cx disease -- BMS PCI in 2001; CABG 2002.  PCI in 2006 (per-op TAH)   Atrial fibrillation and flutter ([place]) 04/2019   New diagnosis-major complaint was chest tightness and dyspnea.   Breast cancer ([place])    Cervix dysplasia    GERD (gastroesophageal reflux disease)    Heart attack ([place]) 2002, 2006   Both events reportedly were perioperatively. She has no recollection. Initial PCI was related to angina symptoms (bilateral arm pain)   High cholesterol    Hypertension    Lumbosacral spinal stenosis    Osteoarthritis    Skin cancer    Sleep apnea    Uses CPAP faithfully   Squamous cell carcinoma of skin 02/12/2020   in situ- left neck-anterior (CX35FU)   Past Surgical History:  Procedure Laterality Date   ABDOMINAL HYSTERECTOMY     bone density     CARDIAC CATHETERIZATION  2001    Dr. Glade [facility]) 2 vessel CAD involving LAD-D1 and distal Cx-OM. Appearance of dilated tapered left main with no significant atherosclerosis on IVUS --  BMS PCI of pLAD ([facility] BMS 3.0 mm x 15 mm) & with PTCA of ostial D1, PTCA of dLAD.  BMS PCI pCx [facility] BMS 3.5 mm x 11 mm) - unable to reopen dCx-OM (  probable distal Atheroembolic occlusion.  Normal LVEF.   COLONOSCOPY     CORONARY ARTERY BYPASS GRAFT  2002   Unsure of details [facility], in [facility])   [place]  2001   ([place]) [facility] BMS PCI pLAD (3.0 mm x 15 mm) -PTCA of jailed D1 as well as distal LAD; pCx ([facility] BMS 3.5 mm x 11 mm) -unable to open distalCx-OM -thromboembolic occlusion   DIAGNOSTIC MAMMOGRAM     LEFT HEART CATH AND CORS/GRAFTS ANGIOGRAPHY N/A 04/25/2019   Procedure: LEFT HEART CATH AND  CORS/GRAFTS ANGIOGRAPHY;  Surgeon: Troy Sine, MD;  Location: [facility] INVASIVE CV LAB;;; ost-prox LCx stent @ OM1 ostium =100% CTO, Ost-prox LAD stent ~30% ISR, Ost D1 CTO, prox RCA ~30%. SCG-OM2 patent with ostial stent 30%, LIMA-LAD patent; CTO of SVG-D1.    left knee replacement     MASTECTOMY     NM MYOVIEW LTD  12/2014; 2018   Cardiologist (Dr. Kellie Simmering.  [place], [place] ph# [phone]) -->LEXISCAN: Nonischemic, "normal "   pap smear     tonsilectomy     TRANSTHORACIC ECHOCARDIOGRAM  12/2014   Echocardiogram 12/25/14- normal LVEF and systolic function, EF 91-63%, mild grade 1 diastolic dysfunction   TRANSTHORACIC ECHOCARDIOGRAM  04/24/2019   EF 65 to 70%.  Moderate septal LVH.  GR 1 DD-elevated EDP.  Normal PAP.  Relatively normal valves.  No aortic stenosis.    Allergies  Allergen Reactions   Codeine Nausea Only    Outpatient Encounter Medications as of 12/17/2021  Medication Sig   DULoxetine (CYMBALTA) 30 MG capsule Take 1 capsule (30 mg total) by mouth daily.   escitalopram (LEXAPRO) 5 MG tablet Take 1 tablet (5 mg total) by mouth daily.   acetaminophen (TYLENOL) 500 MG tablet Take 2 tablets (1,000 mg total) by mouth 2 (two) times daily.   amiodarone (PACERONE) 100 MG tablet Take 100 mg by mouth daily.   Ammonium Lactate (AMLACTIN EX) thin layer topically; topical Once A Day - PRN Special Instructions: May apply topically once daily PRN for dry skin.   apixaban (ELIQUIS) 5 MG TABS tablet Take 1 tablet (5 mg total) by mouth 2 (two) times daily.   atorvastatin (LIPITOR) 80 MG tablet Take 1 tablet (80 mg total) by mouth daily.   Cholecalciferol (VITAMIN D3) 50 MCG (2000 UT) TABS Take 2,000 Units by mouth daily after breakfast.   docusate sodium (COLACE) 100 MG capsule Take 1 capsule (100 mg total) by mouth daily.   fexofenadine (ALLEGRA) 180 MG tablet Take 1 tablet (180 mg total) by mouth daily as needed for allergies or rhinitis.   fluticasone (FLONASE) 50 MCG/ACT nasal spray Place  1 spray into both nostrils daily as needed for allergies or rhinitis.   HYDROcodone-acetaminophen (NORCO/VICODIN) 5-325 MG tablet Take 1 tablet by mouth every 6 (six) hours.   lidocaine (SALONPAS PAIN RELIEVING) 4 % Place 1 patch onto the skin daily.   losartan (COZAAR) 25 MG tablet Take 25 mg by mouth daily.   melatonin 5 MG TABS Take 5 mg by mouth at bedtime.   methocarbamol (ROBAXIN) 500 MG tablet Take 250 mg by mouth as needed.   metoprolol succinate (TOPROL-XL) 50 MG 24 hr tablet Take 1 tablet (50 mg total) by mouth daily. Take with or immediately following a meal.   nitroGLYCERIN (NITROSTAT) 0.4 MG SL tablet Place 0.4 mg under the tongue as needed for chest pain (Every 5 minutes x 3 doses).   nystatin (MYCOSTATIN/NYSTOP) powder Apply 1 Application  topically 2 (two) times daily.   omeprazole (PRILOSEC OTC) 20 MG tablet Take 1 tablet (20 mg total) by mouth daily as needed.   polyethylene glycol (MIRALAX / GLYCOLAX) 17 g packet Take 17 g by mouth daily. Give with 4 oz of water.   senna-docusate (SENOKOT-S) 8.6-50 MG tablet Take 2 tablets by mouth at bedtime.   traMADol (ULTRAM) 50 MG tablet Take 1 tablet (50 mg total) by mouth every 6 (six) hours as needed.   traMADol (ULTRAM) 50 MG tablet Take 0.5 tablets (25 mg total) by mouth at bedtime.   vitamin B-12 (CYANOCOBALAMIN) 1000 MCG tablet Take 1,000 mcg by mouth daily.   zinc oxide 20 % ointment Apply 1 application topically 3 (three) times daily as needed for irritation. Apply to buttocks after every incontinent episode and as needed for redness.   [DISCONTINUED] escitalopram (LEXAPRO) 10 MG tablet Take 1 tablet (10 mg total) by mouth daily.   No facility-administered encounter medications on file as of 12/17/2021.    Review of Systems  Constitutional:  Negative for appetite change, chills, fatigue and fever.  HENT:  Negative for congestion, hearing loss, rhinorrhea and sore throat.   Eyes: Negative.   Respiratory:  Negative for cough,  shortness of breath and wheezing.   Cardiovascular:  Negative for chest pain, palpitations and leg swelling.  Gastrointestinal:  Negative for abdominal pain, constipation, diarrhea, nausea and vomiting.  Genitourinary:  Negative for dysuria.  Musculoskeletal:  Negative for arthralgias, back pain and myalgias.       Hip pain  Skin:  Negative for color change, rash and wound.  Neurological:  Negative for dizziness, weakness and headaches.  Psychiatric/Behavioral:  Negative for behavioral problems. The patient is not nervous/anxious.        Immunization History  Administered Date(s) Administered   Fluad Quad(high Dose 65+) 10/28/2020   Influenza, High Dose Seasonal PF 10/21/2016, 09/24/2017, 10/25/2019   Influenza-Unspecified 10/03/2011, 10/04/2012, 01/13/2016, 11/05/2021   Moderna SARS-COV2 Booster Vaccination 11/21/2019, 06/11/2020, 05/30/2021   Moderna Sars-Covid-2 Vaccination 01/16/2019, 02/13/2019, 03/13/2019   PNEUMOCOCCAL CONJUGATE-20 07/03/2021   Pneumococcal-Unspecified 01/13/2016   Tetanus 12/01/2017   Zoster Recombinat (Shingrix) 08/24/2018, 02/22/2020   Pertinent  Health Maintenance Due  Topic Date Due   HEMOGLOBIN A1C  12/01/2019   OPHTHALMOLOGY EXAM  05/30/2021   FOOT EXAM  10/17/2021   INFLUENZA VACCINE  Completed   DEXA SCAN  Completed      02/07/2021   12:47 PM 06/19/2021   12:11 AM 09/11/2021    3:39 PM 12/04/2021    4:51 AM 12/17/2021    4:07 PM  Fall Risk  Falls in the past year? 1  0  1  Was there an injury with Fall? 0  0  1  Fall Risk Category Calculator 1  0  3  Fall Risk Category Low  Low  High  Patient Fall Risk Level Moderate fall risk Moderate fall risk Low fall risk High fall risk High fall risk  Patient at Risk for Falls Due to History of fall(s);Impaired balance/gait;Impaired mobility;Orthopedic patient  No Fall Risks  History of fall(s)  Patient at Risk for Falls Due to - Comments spinal stenosis of lumbar      Fall risk Follow up Falls  evaluation completed;Education provided;Falls prevention discussed  Falls evaluation completed  Falls evaluation completed     Vitals:   12/17/21 1601  BP: 137/71  Pulse: 72  Resp: 16  Temp: (!) 97.5 F (36.4 C)  SpO2: 94%  Weight: 189  lb 6 oz (85.9 kg)  Height: '5\' 8"'$  (1.727 m)   Body mass index is 28.79 kg/m.  Physical Exam Constitutional:      Appearance: Normal appearance.  HENT:     Head: Normocephalic and atraumatic.     Nose: Nose normal.     Mouth/Throat:     Mouth: Mucous membranes are moist.  Eyes:     Conjunctiva/sclera: Conjunctivae normal.  Cardiovascular:     Rate and Rhythm: Normal rate and regular rhythm.     Heart sounds: Murmur heard.  Pulmonary:     Effort: Pulmonary effort is normal.     Breath sounds: Normal breath sounds.  Abdominal:     General: Bowel sounds are normal.     Palpations: Abdomen is soft.  Musculoskeletal:        General: Normal range of motion.     Cervical back: Normal range of motion.  Skin:    General: Skin is warm and dry.  Neurological:     General: No focal deficit present.     Mental Status: She is alert and oriented to person, place, and time.  Psychiatric:        Mood and Affect: Mood normal.        Thought Content: Thought content normal.        Judgment: Judgment normal.     Comments: Screaming for pain medications        Labs reviewed: Recent Labs    06/19/21 0006 06/19/21 0010 08/26/21 0000 12/04/21 0641  NA 140 141 141 138  K 4.1 4.0 4.1 4.2  CL 104 103 103 105  CO2 28  --  29* 25  GLUCOSE 108* 106*  --  112*  BUN '14 17 17 15  '$ CREATININE 0.85 0.80 0.9 0.84  CALCIUM 9.8  --  10.0 9.7  MG  --   --   --  2.1   Recent Labs    03/26/21 0000 12/04/21 0641  AST 15 20  ALT 12 16  ALKPHOS 47 54  BILITOT  --  0.9  PROT  --  7.4  ALBUMIN 3.8 3.6   Recent Labs    03/26/21 0000 05/01/21 0000 06/19/21 0006 06/19/21 0010 12/04/21 0641  WBC 6.5 6.9 7.2  --  8.7  NEUTROABS 3,627.00  --  3.5   --  5.3  HGB 11.9* 11.7* 13.2 13.9 12.9  HCT 35* 35* 41.8 41.0 39.4  MCV  --   --  105.0*  --  102.1*  PLT 226 221 216  --  249   Lab Results  Component Value Date   TSH 1.390 07/03/2021   Lab Results  Component Value Date   HGBA1C 5.9 (H) 05/31/2019   Lab Results  Component Value Date   CHOL 175 07/03/2021   HDL 59 07/03/2021   LDLCALC 91 07/03/2021   TRIG 146 07/03/2021   CHOLHDL 2.4 04/05/2020    Significant Diagnostic Results in last 30 days:  CT ABDOMEN PELVIS W CONTRAST  Result Date: 12/04/2021 CLINICAL DATA:  Left lower quadrant abdominal pain. EXAM: CT ABDOMEN AND PELVIS WITH CONTRAST TECHNIQUE: Multidetector CT imaging of the abdomen and pelvis was performed using the standard protocol following bolus administration of intravenous contrast. RADIATION DOSE REDUCTION: This exam was performed according to the departmental dose-optimization program which includes automated exposure control, adjustment of the mA and/or kV according to patient size and/or use of iterative reconstruction technique. CONTRAST:  189m OMNIPAQUE IOHEXOL 300 MG/ML  SOLN COMPARISON:  None Available. FINDINGS: Lower chest: Patchy airspace opacities are present at the lung bases, left greater than right. No significant effusions are present. Hepatobiliary: Liver is unremarkable. Layering gallstones are present without inflammatory change. The common bile duct is within normal limits for age. Pancreas: Unremarkable. No pancreatic ductal dilatation or surrounding inflammatory changes. Spleen: Normal in size without focal abnormality. Adrenals/Urinary Tract: The adrenal glands are within normal limits bilaterally. An 8.8 cm simple exophytic cyst emanates from the posterior aspect of the left kidney. Kidneys are otherwise unremarkable. No parenchymal lesion or stone is present. No obstruction is present. Ureters are within normal limits. The urinary bladder is normal. Stomach/Bowel: The stomach is within normal  limits. Duodenal ulcer noted without inflammatory change. Small bowel is un remarkable proximally. Distal small bowel herniates into the right inguinal canal without obstruction. Terminal ileum is within normal limits. Appendix is visualized and within normal limits. Ascending colon is normal. Moderate stool is present throughout the transverse colon without obstruction. No inflammation is present. Descending colon is normal. Sigmoid colon is somewhat redundant without focal inflammation or obstruction. Vascular/Lymphatic: Atherosclerotic calcifications are present in the aorta and branch vessels. No aneurysm is present. No significant adenopathy is present. Reproductive: Status post hysterectomy. No adnexal masses. Other: Bilateral paraumbilical hernias contain fat without bowel. Right inguinal hernia contains a loop of small bowel as described above. A left paramedian ventral hernia anterior to the stomach contains fat without bowel. The opening measures 6 mm. No free fluid or free air is present. Musculoskeletal: Grade 1 degenerative anterolisthesis is present at L4-5 and L5-S1. An inferior endplate fracture at L2 is age indeterminate. No other acute fractures are present. Bony pelvis is within normal limits. The hips are located and within normal limits. IMPRESSION: 1. No acute or focal lesion to explain the patient's left lower quadrant abdominal pain. 2. Right inguinal hernia contains a loop of small bowel without obstruction. 3. Bilateral paraumbilical hernias contain fat without bowel. 4. Left paramedian ventral hernia anterior to the stomach contains fat without bowel. 5. Cholelithiasis without evidence of cholecystitis. 6. Patchy airspace opacities at the lung bases, left greater than right. This may represent atelectasis or infection. 7. Age indeterminate inferior endplate fracture at L2. 8. Grade 1 degenerative anterolisthesis at L4-5 and L5-S1. 9. 8.8 cm simple exophytic cyst emanates from the posterior  aspect of the left kidney. No follow-up imaging is recommended. JACR 2018 Feb; 264-273, Management of the Incidental Renal Mass on CT, [employer] 2021; 814-848, Bosniak Classification of Cystic Renal Masses, Version 2019. 10.  Aortic Atherosclerosis (ICD10-I70.0). Electronically Signed   By: San Morelle M.D.   On: 12/04/2021 08:39   DG Shoulder Right  Result Date: 12/04/2021 CLINICAL DATA:  Persistent shoulder pain after falling several months previously EXAM: RIGHT SHOULDER - 2+ VIEW COMPARISON:  Prior chest x-ray 04/23/2019 FINDINGS: No acute fracture or malalignment. Degenerative osteoarthritis is present at the acromioclavicular joint. Rounded calcification overlies the right chest. This may be within the breast, or the lung but is unchanged compared to prior imaging from 3235 and almost certainly benign. Chronic bronchitic changes visualized in the lung. Mild degenerative changes at the rotator cuff insertion. IMPRESSION: 1. Degenerative osteoarthritis at the acromioclavicular joint. 2. Degenerative changes at the rotator cuff insertion. 3. No acute fracture or malalignment. Electronically Signed   By: Jacqulynn Cadet M.D.   On: 12/04/2021 07:14    Assessment/Plan  1. Chronic left hip pain -  continue Norco 5-325 mg PO Q 6 hours and Tramadol  25 mg PO at bedtime -  will start on Cymbalta - DULoxetine (CYMBALTA) 30 MG capsule; Take 1 capsule (30 mg total) by mouth daily.  Dispense: 30 capsule; Refill: 3  2. Recurrent depression ([place]) -  Lexapro dosage decreased due to addition of Cymbalta - escitalopram (LEXAPRO) 5 MG tablet; Take 1 tablet (5 mg total) by mouth daily.  Dispense: 30 tablet; Refill: 3  3. Mild vascular dementia with mood disturbance ([facility]) -  MMSE 25/30, ranging in mild dementia -  continue supportive care   Family/ staff Communication: Discussed plan of care with resident and charge nurse.  Labs/tests ordered:  None    Durenda Age, DNP, MSN,  FNP-BC [facility] and Adult Medicine [phone] (Monday-Friday 8:00 a.m. - 5:00 p.m.) [phone] (after hours)

## 2021-12-17 NOTE — Telephone Encounter (Signed)
Clearance faced back to [phone]. Confirmation received.

## 2021-12-17 NOTE — Telephone Encounter (Signed)
Pre procedure/injection risk for lumbar interlaminar epidural steroid injection received. Placed on NP desk for review.

## 2021-12-18 DIAGNOSIS — R29898 Other symptoms and signs involving the musculoskeletal system: Secondary | ICD-10-CM | POA: Diagnosis not present

## 2021-12-18 DIAGNOSIS — M6281 Muscle weakness (generalized): Secondary | ICD-10-CM | POA: Diagnosis not present

## 2021-12-18 DIAGNOSIS — M25511 Pain in right shoulder: Secondary | ICD-10-CM | POA: Diagnosis not present

## 2021-12-19 ENCOUNTER — Encounter: Payer: Self-pay | Admitting: Nurse Practitioner

## 2021-12-19 ENCOUNTER — Non-Acute Institutional Stay (SKILLED_NURSING_FACILITY): Payer: Medicare Other | Admitting: Nurse Practitioner

## 2021-12-19 DIAGNOSIS — I25709 Atherosclerosis of coronary artery bypass graft(s), unspecified, with unspecified angina pectoris: Secondary | ICD-10-CM

## 2021-12-19 DIAGNOSIS — I5032 Chronic diastolic (congestive) heart failure: Secondary | ICD-10-CM | POA: Diagnosis not present

## 2021-12-19 DIAGNOSIS — K4090 Unilateral inguinal hernia, without obstruction or gangrene, not specified as recurrent: Secondary | ICD-10-CM

## 2021-12-19 DIAGNOSIS — F418 Other specified anxiety disorders: Secondary | ICD-10-CM

## 2021-12-19 DIAGNOSIS — I639 Cerebral infarction, unspecified: Secondary | ICD-10-CM

## 2021-12-19 DIAGNOSIS — M159 Polyosteoarthritis, unspecified: Secondary | ICD-10-CM | POA: Diagnosis not present

## 2021-12-19 DIAGNOSIS — K219 Gastro-esophageal reflux disease without esophagitis: Secondary | ICD-10-CM

## 2021-12-19 DIAGNOSIS — F01B3 Vascular dementia, moderate, with mood disturbance: Secondary | ICD-10-CM

## 2021-12-19 DIAGNOSIS — N183 Chronic kidney disease, stage 3 unspecified: Secondary | ICD-10-CM

## 2021-12-19 DIAGNOSIS — I10 Essential (primary) hypertension: Secondary | ICD-10-CM | POA: Diagnosis not present

## 2021-12-19 DIAGNOSIS — K5901 Slow transit constipation: Secondary | ICD-10-CM

## 2021-12-19 DIAGNOSIS — I480 Paroxysmal atrial fibrillation: Secondary | ICD-10-CM | POA: Diagnosis not present

## 2021-12-19 DIAGNOSIS — F5105 Insomnia due to other mental disorder: Secondary | ICD-10-CM | POA: Diagnosis not present

## 2021-12-19 NOTE — Assessment & Plan Note (Signed)
Bun/creat 15/0.84 12/04/21

## 2021-12-19 NOTE — Assessment & Plan Note (Addendum)
takes Metoprolol, Eliquis, Amiodarone, f/u cardiology. TSH 1.39 07/03/21, update TSH

## 2021-12-19 NOTE — Assessment & Plan Note (Signed)
takes Losartan, Metoprolol.

## 2021-12-19 NOTE — Progress Notes (Addendum)
Location:   SNF [place] Room Number: 34 Place of Service:  SNF (31) Provider: Lennie Odor Avalie Oconnor NP  Mylan Schwarz X, NP  Patient Care Team: Trystyn Sitts X, NP as PCP - General (Internal Medicine) Leonie Dazani Norby, MD as PCP - Cardiology (Cardiology) Mauri Temkin X, NP as Nurse Practitioner (Internal Medicine) Lavonna Monarch, MD (Inactive) as Consulting Physician (Dermatology)  Extended Emergency Contact Information Primary Emergency Contact: Robeson Mobile Phone: [phone] Relation: Daughter Secondary Emergency Contact: Weeks,Tiffany Mobile Phone: [phone] Relation: Daughter Preferred language: English Interpreter needed? No  Code Status: DNR Goals of care: Advanced Directive information    12/17/2021    4:09 PM  Advanced Directives  Does Patient Have a Medical Advance Directive? Yes  Type of Paramedic of [place];Living will;Out of facility DNR (pink MOST or yellow form)  Does patient want to make changes to medical advance directive? No - Patient declined  Copy of [place] in Chart? Yes - validated most recent copy scanned in chart (See row information)  Would patient like information on creating a medical advance directive? No - Patient declined  Pre-existing out of facility DNR order (yellow form or pink MOST form) Pink MOST form placed in chart (order not valid for inpatient use);Yellow form placed in chart (order not valid for inpatient use)     Chief Complaint  Patient presents with   Acute Visit    Pain, irritability    HPI:  Pt is a 88 y.o. female seen today for an acute visit for uncontrolled pain in the left shoulder, hip, knee, takes Norco 5/36m q6hr, q6hr prn, Methocarbamol, Tramadol.  The patient appears irritable, not sleep well at night, started Cymbalta, reduced Lexapro to 539mqd, on Lorazepam 0.22m622m8hr prn. Denied chest or abd pain, no cough, nausea, vomiting, or constipation.   ED eval 12/04/21  for pain R shoulder-X-ray showed no acute fx, L hip, LLQ pain-CT abd identified inguinal hernia  w/o obstruction or gangrene-no further c/o abd pain. OA pain is managed with Norco. HPOA daughter desires comfort measures presently.   R inguinal hernia  w/o obstruction or gangrene-no further c/o abd pain, surgeon consultation 12/19/21             TIA, stable, on Eliquis, Atorvastatin.  CVA, Neurology: cerebral infarction,MRI chronic right cerebellar stroke, mild to moderate generalized atrophy, small vessel disease with multiple chronic lacunar infarcts, possible left sided stroke not seen on imaging-saw 2 fingers in front of her upon my examination,  numbness in right side of tongue, R sided deficits,  transient vision disturbance of R eye.  OA, left knee s/p TKR, MR lumbar, severe spinal 11/13/20 spinal and foraminal stenosis with left sciatica, Norco relieves pain              Constipation, stable, on Senna, MiraLax.              Afib, takes Metoprolol, Eliquis, Amiodarone, f/u cardiology. TSH 1.39 07/03/21             HTN, takes Losartan, Metoprolol.              GERD, stable, takes Omeprazole, Hgb 12.9 12/04/21             CAD, stable, Hx of CABG Cath 04/25/19, prn NTG, takes Atorvastatin. LDL 74 04/05/21             CKD Bun/creat 15/0.84 12/04/21  Insomnia/OSA, not using CPAP,  Lorazepam helps.              Depression, better emotional/teary,  off Sertraline 2/2 confusion. Reduced Lexapro 33m qd, added Cymbalta 310mqd 12/17/21, prn Lorazepam, added Depakote 12529mid 12/18/21             Vascular cognitive impairment, MMSE 27/30, SNF for supportive care. Vit B12 324 07/03/21, on Vit B12 1000m34md.              BLE edema, EF 60% 08/03/21, 12/15/21  resume Furosemide 20mg49m12/4/23       Past Medical History:  Diagnosis Date   Atherosclerosis of native coronary artery 2001   LAD & Cx disease -- BMS PCI in 2001; CABG 2002.  PCI in 2006 (per-op TAH)   Atrial fibrillation and flutter (HCC) [place]/2021   New diagnosis-major complaint was chest tightness and dyspnea.   Breast cancer (HCC) [place]ervix dysplasia    GERD (gastroesophageal reflux disease)    Heart attack (HCC) [place], 2006   Both events reportedly were perioperatively. She has no recollection. Initial PCI was related to angina symptoms (bilateral arm pain)   High cholesterol    Hypertension    Lumbosacral spinal stenosis    Osteoarthritis    Skin cancer    Sleep apnea    Uses CPAP faithfully   Squamous cell carcinoma of skin 02/12/2020   in situ- left neck-anterior (CX35FU)   Past Surgical History:  Procedure Laterality Date   ABDOMINAL HYSTERECTOMY     bone density     CARDIAC CATHETERIZATION  2001    [facility] involving LAD-D1 and distal Cx-OM. Appearance of dilated tapered left main with no significant atherosclerosis on IVUS --  BMS PCI of pLAD ([facility] BMS 3.0 mm x 15 mm) & with PTCA of ostial D1, PTCA of dLAD.  [facility] pCx [facility]) - unable to reopen dCx-OM (probable distal Atheroembolic occlusion.  Normal LVEF.   COLONOSCOPY     CORONARY ARTERY BYPASS GRAFT  2002   [facility] of details [facility]  [facility]   ([place] BMS PCI pLAD (3.0 mm x 15 mm) -PTCA of jailed D1 as well as distal LAD; pCx ([facility] BMS 3.5 mm x 11 mm) -unable to open distalCx-OM -thromboembolic occlusion   DIAGNOSTIC MAMMOGRAM     LEFT HEART CATH AND CORS/GRAFTS ANGIOGRAPHY N/A 04/25/2019   Procedure: LEFT HEART CATH AND CORS/GRAFTS ANGIOGRAPHY;  Surgeon: KellyTroy Sine  Location: [facility] IN[facility]ASIVE CV LAB;;; ost-prox LCx stent @ OM1 ostium =100% CTO, Ost-prox LAD stent ~30% ISR, Ost D1 CTO, prox RCA ~30%. SCG-OM2 patent with ostial stent 30%, LIMA-LAD patent; CTO of SVG-D1.    left knee replacement     MASTECTOMY     NM MYOVIEW LTD  12/2014; 2018   Cardiologist (Dr. GavinKellie Simmeringris, [phone]: Nonischemic, "normal "    pap smear     tonsilectomy     TRANSTHORACIC ECHOCARDIOGRAM  12/2014   Echocardiogram 12/25/14- normal LVEF and systolic function, EF 50-5541-42%d grade 1 diastolic dysfunction   TRANSTHORACIC ECHOCARDIOGRAM  04/24/2019   EF 65 to 70%.  Moderate septal LVH.  GR 1 DD-elevated EDP.  Normal PAP.  Relatively normal valves.  No aortic stenosis.    Allergies  Allergen Reactions   Codeine Nausea Only  Allergies as of 12/19/2021       Reactions   Codeine Nausea Only        Medication List        Accurate as of December 19, 2021 12:16 PM. If you have any questions, ask your nurse or doctor.          acetaminophen 500 MG tablet Commonly known as: TYLENOL Take 2 tablets (1,000 mg total) by mouth 2 (two) times daily.   amiodarone 100 MG tablet Commonly known as: PACERONE Take 100 mg by mouth daily.   AMLACTIN EX thin layer topically; topical Once A Day - PRN Special Instructions: May apply topically once daily PRN for dry skin.   apixaban 5 MG Tabs tablet Commonly known as: Eliquis Take 1 tablet (5 mg total) by mouth 2 (two) times daily.   atorvastatin 80 MG tablet Commonly known as: LIPITOR Take 1 tablet (80 mg total) by mouth daily.   cyanocobalamin 1000 MCG tablet Commonly known as: VITAMIN B12 Take 1,000 mcg by mouth daily.   docusate sodium 100 MG capsule Commonly known as: COLACE Take 1 capsule (100 mg total) by mouth daily.   DULoxetine 30 MG capsule Commonly known as: Cymbalta Take 1 capsule (30 mg total) by mouth daily.   escitalopram 5 MG tablet Commonly known as: Lexapro Take 1 tablet (5 mg total) by mouth daily.   fexofenadine 180 MG tablet Commonly known as: ALLEGRA Take 1 tablet (180 mg total) by mouth daily as needed for allergies or rhinitis.   fluticasone 50 MCG/ACT nasal spray Commonly known as: FLONASE Place 1 spray into both nostrils daily as needed for allergies or rhinitis.   HYDROcodone-acetaminophen 5-325 MG tablet Commonly known  as: NORCO/VICODIN Take 1 tablet by mouth every 6 (six) hours.   losartan 25 MG tablet Commonly known as: COZAAR Take 25 mg by mouth daily.   melatonin 5 MG Tabs Take 5 mg by mouth at bedtime.   methocarbamol 500 MG tablet Commonly known as: ROBAXIN Take 250 mg by mouth as needed.   metoprolol succinate 50 MG 24 hr tablet Commonly known as: TOPROL-XL Take 1 tablet (50 mg total) by mouth daily. Take with or immediately following a meal.   nitroGLYCERIN 0.4 MG SL tablet Commonly known as: NITROSTAT Place 0.4 mg under the tongue as needed for chest pain (Every 5 minutes x 3 doses).   nystatin powder Commonly known as: MYCOSTATIN/NYSTOP Apply 1 Application topically 2 (two) times daily.   omeprazole 20 MG tablet Commonly known as: PRILOSEC OTC Take 1 tablet (20 mg total) by mouth daily as needed.   polyethylene glycol 17 g packet Commonly known as: MIRALAX / GLYCOLAX Take 17 g by mouth daily. Give with 4 oz of water.   Salonpas Pain Relieving 4 % Generic drug: lidocaine Place 1 patch onto the skin daily.   senna-docusate 8.6-50 MG tablet Commonly known as: Senokot-S Take 2 tablets by mouth at bedtime.   traMADol 50 MG tablet Commonly known as: Ultram Take 1 tablet (50 mg total) by mouth every 6 (six) hours as needed.   traMADol 50 MG tablet Commonly known as: ULTRAM Take 0.5 tablets (25 mg total) by mouth at bedtime.   Vitamin D3 50 MCG (2000 UT) Tabs Take 2,000 Units by mouth daily after breakfast.   zinc oxide 20 % ointment Apply 1 application topically 3 (three) times daily as needed for irritation. Apply to buttocks after every incontinent episode and as needed for redness.  Review of Systems  Constitutional:  Negative for activity change, appetite change and fever.  HENT:  Negative for congestion, hearing loss and voice change.   Eyes:  Negative for visual disturbance.       C/o eye sight issue upon first awake in am, mostly R eye, better as day  goes on.   Respiratory:  Negative for cough, chest tightness, shortness of breath and wheezing.   Cardiovascular:  Positive for leg swelling.  Gastrointestinal:  Negative for abdominal pain, blood in stool, constipation, nausea and vomiting.  Genitourinary:  Negative for dysuria and urgency.  Musculoskeletal:  Positive for arthralgias, back pain and gait problem.       S/p L TKR,  left hip, left knee, R+L shoulder pain.   Skin:  Negative for color change.          Neurological:  Positive for numbness. Negative for speech difficulty and headaches.       Memory lapses. C/o numbness right side of tongue.   Hematological:  Does not bruise/bleed easily.  Psychiatric/Behavioral:  Positive for sleep disturbance. Negative for behavioral problems and dysphoric mood. The patient is nervous/anxious.        Better    Immunization History  Administered Date(s) Administered   Fluad Quad(high Dose 65+) 10/28/2020   Influenza, High Dose Seasonal PF 10/21/2016, 09/24/2017, 10/25/2019   Influenza-Unspecified 10/03/2011, 10/04/2012, 01/13/2016, 11/05/2021   Moderna SARS-COV2 Booster Vaccination 11/21/2019, 06/11/2020, 05/30/2021   Moderna Sars-Covid-2 Vaccination 01/16/2019, 02/13/2019, 03/13/2019   PNEUMOCOCCAL CONJUGATE-20 07/03/2021   Pneumococcal-Unspecified 01/13/2016   Tetanus 12/01/2017   Zoster Recombinat (Shingrix) 08/24/2018, 02/22/2020   Pertinent  Health Maintenance Due  Topic Date Due   HEMOGLOBIN A1C  12/01/2019   OPHTHALMOLOGY EXAM  05/30/2021   FOOT EXAM  10/17/2021   INFLUENZA VACCINE  Completed   DEXA SCAN  Completed      02/07/2021   12:47 PM 06/19/2021   12:11 AM 09/11/2021    3:39 PM 12/04/2021    4:51 AM 12/17/2021    4:07 PM  Fall Risk  Falls in the past year? 1  0  1  Was there an injury with Fall? 0  0  1  Fall Risk Category Calculator 1  0  3  Fall Risk Category Low  Low  High  Patient Fall Risk Level Moderate fall risk Moderate fall risk Low fall risk High fall  risk High fall risk  Patient at Risk for Falls Due to History of fall(s);Impaired balance/gait;Impaired mobility;Orthopedic patient  No Fall Risks  History of fall(s)  Patient at Risk for Falls Due to - Comments spinal stenosis of lumbar      Fall risk Follow up Falls evaluation completed;Education provided;Falls prevention discussed  Falls evaluation completed  Falls evaluation completed   Functional Status Survey:    Vitals:   12/19/21 1115  BP: 137/71  Pulse: 72  Resp: 16  Temp: (!) 97.5 F (36.4 C)  SpO2: 94%  Weight: 189 lb 9.6 oz (86 kg)   Body mass index is 28.83 kg/m. Physical Exam Constitutional:      Appearance: Normal appearance.  HENT:     Head: Normocephalic and atraumatic.     Nose: Nose normal.     Mouth/Throat:     Mouth: Mucous membranes are moist.  Eyes:     Extraocular Movements: Extraocular movements intact.     Pupils: Pupils are equal, round, and reactive to light.  Cardiovascular:     Rate and Rhythm: Normal rate and  regular rhythm.     Heart sounds: Murmur heard.  Pulmonary:     Effort: Pulmonary effort is normal.     Breath sounds: Rales present.     Comments: Bibasilar rales present Abdominal:     General: Bowel sounds are normal.     Palpations: Abdomen is soft.     Tenderness: There is no abdominal tenderness. There is no right CVA tenderness, left CVA tenderness, guarding or rebound.     Hernia: A hernia is present.     Comments: R inguinal hernia per CT abd.   Musculoskeletal:     Cervical back: Normal range of motion and neck supple.     Right lower leg: Edema present.     Left lower leg: Edema present.     Comments: 2+edema BLE. Slightly R arm weakness. persisted right shoulder/arm aches. No decreased ROM. Chronic pain in the left hip, knee, and aches in the left shoulder-no redness, swelling, deformity, or decreased ROM-except preexisting limited over head ROM  Skin:    General: Skin is warm and dry.     Comments: From previous exam:  BLE cool to touch, dependent rubor noted. Mild venous insufficiency skin changes above ankles.  S/p right mastectomy.   Neurological:     General: No focal deficit present.     Mental Status: She is alert and oriented to person, place, and time. Mental status is at baseline.     Motor: Weakness present.     Coordination: Coordination normal.     Gait: Gait abnormal.     Comments: C/o numbness of the right side of tongue. Saw 2 fingers in front of her upon my examination left eye.   Psychiatric:        Mood and Affect: Mood normal.        Behavior: Behavior normal.        Thought Content: Thought content normal.     Labs reviewed: Recent Labs    06/19/21 0006 06/19/21 0010 08/26/21 0000 10/21/21 0630 12/04/21 0641  NA 140 141 141 140 138  K 4.1 4.0 4.1 4.3 4.2  CL 104 103 103 102 105  CO2 28  --  29* 30* 25  GLUCOSE 108* 106*  --   --  112*  BUN _0 CREATININE 0.85 0.80 0.9 0.8 0.84  CALCIUM 9.8  --  10.0 10.0 9.7  MG  --   --   --   --  2.1   Recent Labs    03/26/21 0000 12/04/21 0641  AST 15 20  ALT 12 16  ALKPHOS 47 54  BILITOT  --  0.9  PROT  --  7.4  ALBUMIN 3.8 3.6   Recent Labs    03/26/21 0000 05/01/21 0000 06/19/21 0006 06/19/21 0010 12/04/21 0641  WBC 6.5 6.9 7.2  --  8.7  NEUTROABS 3,627.00  --  3.5  --  5.3  HGB 11.9* 11.7* 13.2 13.9 12.9  HCT 35* 35* 41.8 41.0 39.4  MCV  --   --  105.0*  --  102.1*  PLT 226 221 216  --  249   Lab Results  Component Value Date   TSH 1.390 07/03/2021   Lab Results  Component Value Date   HGBA1C 5.9 (H) 05/31/2019   Lab Results  Component Value Date   CHOL 175 07/03/2021   HDL 59 07/03/2021   LDLCALC 91 07/03/2021   TRIG 146 07/03/2021   CHOLHDL 2.4 04/05/2020  Significant Diagnostic Results in last 30 days:  CT ABDOMEN PELVIS W CONTRAST  Result Date: 12/04/2021 CLINICAL DATA:  Left lower quadrant abdominal pain. EXAM: CT ABDOMEN AND PELVIS WITH CONTRAST TECHNIQUE: Multidetector CT  imaging of the abdomen and pelvis was performed using the standard protocol following bolus administration of intravenous contrast. RADIATION DOSE REDUCTION: This exam was performed according to the departmental dose-optimization program which includes automated exposure control, adjustment of the mA and/or kV according to patient size and/or use of iterative reconstruction technique. CONTRAST:  173m OMNIPAQUE IOHEXOL 300 MG/ML  SOLN COMPARISON:  None Available. FINDINGS: Lower chest: Patchy airspace opacities are present at the lung bases, left greater than right. No significant effusions are present. Hepatobiliary: Liver is unremarkable. Layering gallstones are present without inflammatory change. The common bile duct is within normal limits for age. Pancreas: Unremarkable. No pancreatic ductal dilatation or surrounding inflammatory changes. Spleen: Normal in size without focal abnormality. Adrenals/Urinary Tract: The adrenal glands are within normal limits bilaterally. An 8.8 cm simple exophytic cyst emanates from the posterior aspect of the left kidney. Kidneys are otherwise unremarkable. No parenchymal lesion or stone is present. No obstruction is present. Ureters are within normal limits. The urinary bladder is normal. Stomach/Bowel: The stomach is within normal limits. Duodenal ulcer noted without inflammatory change. Small bowel is un remarkable proximally. Distal small bowel herniates into the right inguinal canal without obstruction. Terminal ileum is within normal limits. Appendix is visualized and within normal limits. Ascending colon is normal. Moderate stool is present throughout the transverse colon without obstruction. No inflammation is present. Descending colon is normal. Sigmoid colon is somewhat redundant without focal inflammation or obstruction. Vascular/Lymphatic: Atherosclerotic calcifications are present in the aorta and branch vessels. No aneurysm is present. No significant adenopathy is  present. Reproductive: Status post hysterectomy. No adnexal masses. Other: Bilateral paraumbilical hernias contain fat without bowel. Right inguinal hernia contains a loop of small bowel as described above. A left paramedian ventral hernia anterior to the stomach contains fat without bowel. The opening measures 6 mm. No free fluid or free air is present. Musculoskeletal: Grade 1 degenerative anterolisthesis is present at L4-5 and L5-S1. An inferior endplate fracture at L2 is age indeterminate. No other acute fractures are present. Bony pelvis is within normal limits. The hips are located and within normal limits. IMPRESSION: 1. No acute or focal lesion to explain the patient's left lower quadrant abdominal pain. 2. Right inguinal hernia contains a loop of small bowel without obstruction. 3. Bilateral paraumbilical hernias contain fat without bowel. 4. Left paramedian ventral hernia anterior to the stomach contains fat without bowel. 5. Cholelithiasis without evidence of cholecystitis. 6. Patchy airspace opacities at the lung bases, left greater than right. This may represent atelectasis or infection. 7. Age indeterminate inferior endplate fracture at L2. 8. Grade 1 degenerative anterolisthesis at L4-5 and L5-S1. 9. 8.8 cm simple exophytic cyst emanates from the posterior aspect of the left kidney. No follow-up imaging is recommended. JACR 2018 Feb; 264-273, Management of the Incidental Renal Mass on CT, [employer] 2021; 814-848, Bosniak Classification of Cystic Renal Masses, Version 2019. 10.  Aortic Atherosclerosis (ICD10-I70.0). Electronically Signed   By: CSan MorelleM.D.   On: 12/04/2021 08:39   DG Shoulder Right  Result Date: 12/04/2021 CLINICAL DATA:  Persistent shoulder pain after falling several months previously EXAM: RIGHT SHOULDER - 2+ VIEW COMPARISON:  Prior chest x-ray 04/23/2019 FINDINGS: No acute fracture or malalignment. Degenerative osteoarthritis is present at the acromioclavicular  joint. Rounded calcification  overlies the right chest. This may be within the breast, or the lung but is unchanged compared to prior imaging from 6294 and almost certainly benign. Chronic bronchitic changes visualized in the lung. Mild degenerative changes at the rotator cuff insertion. IMPRESSION: 1. Degenerative osteoarthritis at the acromioclavicular joint. 2. Degenerative changes at the rotator cuff insertion. 3. No acute fracture or malalignment. Electronically Signed   By: Jacqulynn Cadet M.D.   On: 12/04/2021 07:14    Assessment/Plan: (HFpEF) heart failure with preserved ejection fraction ([place]) Persisted BLE edema, bibasilar rales, EF 60% 08/03/21, 12/15/21  resume Furosemide 64m qd, Kcl, wil increase Furosemide to 467mqd x3 days, then resume 2038md, update CMP/eGFR   Osteoarthritis of multiple joints uncontrolled pain in the left shoulder, hip, knee, will increase Norco 10/325m36m325mg q6hr, continue Norco 5/325 q6hr prn, off Methocarbamol-? efficacy, Tramadol-eliminate possible AR of CNS stimulation, Hx of  left knee s/p TKR, MR lumbar, severe spinal 11/13/20 spinal and foraminal stenosis with left sciatica. Update CPR, ESR, CBC/diff.   Insomnia secondary to depression with anxiety The patient appears irritable, not sleep well at night, started Cymbalta, reduced Lexapro to 5mg 18m12/6/23, on Lorazepam 0.5mg q15m prn.  Insomnia/OSA, not using CPAP, persisted             Depression, better emotional/teary,  off Sertraline 2/2 confusion. Reduced Lexapro 5mg qd31mdded Cymbalta 30mg qd1m6/23, added Depakote 125mg bid29m7/23  Right inguinal hernia R inguinal hernia  w/o obstruction or gangrene-no further c/o abd pain, surgeon consultation 12/19/21  CVA (cerebral vascular accident) (HCC)   [place], on Eliquis, Atorvastatin.  CVA, Neurology: cerebral infarction,MRI chronic right cerebellar stroke, mild to moderate generalized atrophy, small vessel disease with multiple chronic lacunar  infarcts, possible left sided stroke not seen on imaging-saw 2 fingers in front of her upon my examination,  numbness in right side of tongue, R sided deficits,  transient vision disturbance of R eye.   Constipation stable, on Senna, MiraLax.   Paroxysmal atrial fibrillation/flutter (HCC) takes Metoprolol, Eliquis, Amiodarone, f/u cardiology. TSH 1.39 07/03/21, update TSH  Essential hypertension  takes Losartan, Metoprolol.   Gastroesophageal reflux disease stable, takes Omeprazole, Hgb 12.9 12/04/21  Coronary artery disease involving coronary bypass graft of native heart with angina pectoris (HCC)  stable, Hx of CABG Cath 04/25/19, prn NTG, takes Atorvastatin. LDL 74 04/05/21  CKD (chronic kidney disease) stage 3, GFR 30-59 ml/min (HCC) Bun/creat 15/0.84 12/04/21  Vascular dementia (HCC) Vascular cognitive impairment, MMSE 27/30, SNF for supportive care. Vit B12 324 07/03/21, on Vit B12 1000mcg qd.79m Family/ staff Communication: plan of care reviewed with the patient, the patient's HOPA daughter, and charge nurse.   Labs/tests ordered:  ESR, CRP, TSH, CMP/eGFR, CBC/diff  Time spend 35 minutes.

## 2021-12-19 NOTE — Assessment & Plan Note (Signed)
TIA, stable, on Eliquis, Atorvastatin.  CVA, Neurology: cerebral infarction,MRI chronic right cerebellar stroke, mild to moderate generalized atrophy, small vessel disease with multiple chronic lacunar infarcts, possible left sided stroke not seen on imaging-saw 2 fingers in front of her upon my examination,  numbness in right side of tongue, R sided deficits,  transient vision disturbance of R eye.

## 2021-12-19 NOTE — Assessment & Plan Note (Signed)
stable, on Senna, MiraLax.

## 2021-12-19 NOTE — Assessment & Plan Note (Signed)
stable, takes Omeprazole, Hgb 12.9 12/04/21

## 2021-12-19 NOTE — Assessment & Plan Note (Addendum)
uncontrolled pain in the left shoulder, hip, knee, will increase Norco 10/335m/5/325mg q6hr, continue Norco 5/325 q6hr prn, off Methocarbamol-? efficacy, Tramadol-eliminate possible AR of CNS stimulation, Hx of  left knee s/p TKR, MR lumbar, severe spinal 11/13/20 spinal and foraminal stenosis with left sciatica. Update CPR, ESR, CBC/diff.

## 2021-12-19 NOTE — Assessment & Plan Note (Signed)
R inguinal hernia  w/o obstruction or gangrene-no further c/o abd pain, surgeon consultation 12/19/21

## 2021-12-19 NOTE — Assessment & Plan Note (Signed)
stable, Hx of CABG Cath 04/25/19, prn NTG, takes Atorvastatin. LDL 74 04/05/21

## 2021-12-19 NOTE — Assessment & Plan Note (Signed)
Vascular cognitive impairment, MMSE 27/30, SNF for supportive care. Vit B12 324 07/03/21, on Vit B12 1062mg qd.

## 2021-12-19 NOTE — Assessment & Plan Note (Signed)
The patient appears irritable, not sleep well at night, started Cymbalta, reduced Lexapro to '5mg'$  qd 12/17/21, on Lorazepam 0.'5mg'$  q8hr prn.  Insomnia/OSA, not using CPAP, persisted             Depression, better emotional/teary,  off Sertraline 2/2 confusion. Reduced Lexapro '5mg'$  qd, added Cymbalta '30mg'$  qd 12/17/21, added Depakote '125mg'$  bid 12/18/21

## 2021-12-19 NOTE — Assessment & Plan Note (Signed)
Persisted BLE edema, bibasilar rales, EF 60% 08/03/21, 12/15/21  resume Furosemide 52m qd, Kcl, wil increase Furosemide to 428mqd x3 days, then resume 2010md, update CMP/eGFR

## 2021-12-21 DIAGNOSIS — R609 Edema, unspecified: Secondary | ICD-10-CM | POA: Diagnosis not present

## 2021-12-21 DIAGNOSIS — Z7401 Bed confinement status: Secondary | ICD-10-CM | POA: Diagnosis not present

## 2021-12-21 DIAGNOSIS — W19XXXA Unspecified fall, initial encounter: Secondary | ICD-10-CM | POA: Diagnosis not present

## 2021-12-21 DIAGNOSIS — G8929 Other chronic pain: Secondary | ICD-10-CM | POA: Diagnosis not present

## 2021-12-21 DIAGNOSIS — I10 Essential (primary) hypertension: Secondary | ICD-10-CM | POA: Diagnosis not present

## 2021-12-21 DIAGNOSIS — S82892A Other fracture of left lower leg, initial encounter for closed fracture: Secondary | ICD-10-CM | POA: Diagnosis not present

## 2021-12-21 DIAGNOSIS — R4182 Altered mental status, unspecified: Secondary | ICD-10-CM | POA: Diagnosis not present

## 2021-12-22 ENCOUNTER — Encounter: Payer: Self-pay | Admitting: Nurse Practitioner

## 2021-12-22 ENCOUNTER — Non-Acute Institutional Stay (SKILLED_NURSING_FACILITY): Payer: Medicare Other | Admitting: Nurse Practitioner

## 2021-12-22 DIAGNOSIS — I480 Paroxysmal atrial fibrillation: Secondary | ICD-10-CM | POA: Diagnosis not present

## 2021-12-22 DIAGNOSIS — F5105 Insomnia due to other mental disorder: Secondary | ICD-10-CM | POA: Diagnosis not present

## 2021-12-22 DIAGNOSIS — I25709 Atherosclerosis of coronary artery bypass graft(s), unspecified, with unspecified angina pectoris: Secondary | ICD-10-CM

## 2021-12-22 DIAGNOSIS — K219 Gastro-esophageal reflux disease without esophagitis: Secondary | ICD-10-CM

## 2021-12-22 DIAGNOSIS — N183 Chronic kidney disease, stage 3 unspecified: Secondary | ICD-10-CM

## 2021-12-22 DIAGNOSIS — M6281 Muscle weakness (generalized): Secondary | ICD-10-CM | POA: Diagnosis not present

## 2021-12-22 DIAGNOSIS — I639 Cerebral infarction, unspecified: Secondary | ICD-10-CM

## 2021-12-22 DIAGNOSIS — R29898 Other symptoms and signs involving the musculoskeletal system: Secondary | ICD-10-CM | POA: Diagnosis not present

## 2021-12-22 DIAGNOSIS — K5901 Slow transit constipation: Secondary | ICD-10-CM | POA: Diagnosis not present

## 2021-12-22 DIAGNOSIS — R5383 Other fatigue: Secondary | ICD-10-CM

## 2021-12-22 DIAGNOSIS — R609 Edema, unspecified: Secondary | ICD-10-CM

## 2021-12-22 DIAGNOSIS — M159 Polyosteoarthritis, unspecified: Secondary | ICD-10-CM | POA: Diagnosis not present

## 2021-12-22 DIAGNOSIS — F418 Other specified anxiety disorders: Secondary | ICD-10-CM

## 2021-12-22 DIAGNOSIS — S82892D Other fracture of left lower leg, subsequent encounter for closed fracture with routine healing: Secondary | ICD-10-CM | POA: Diagnosis not present

## 2021-12-22 DIAGNOSIS — K4090 Unilateral inguinal hernia, without obstruction or gangrene, not specified as recurrent: Secondary | ICD-10-CM

## 2021-12-22 DIAGNOSIS — I10 Essential (primary) hypertension: Secondary | ICD-10-CM | POA: Diagnosis not present

## 2021-12-22 DIAGNOSIS — F01B3 Vascular dementia, moderate, with mood disturbance: Secondary | ICD-10-CM

## 2021-12-22 DIAGNOSIS — M25511 Pain in right shoulder: Secondary | ICD-10-CM | POA: Diagnosis not present

## 2021-12-22 DIAGNOSIS — S82892A Other fracture of left lower leg, initial encounter for closed fracture: Secondary | ICD-10-CM | POA: Insufficient documentation

## 2021-12-22 NOTE — Assessment & Plan Note (Signed)
EF 60% 08/03/21, 12/15/21, improving gradually, resumed Furosemide

## 2021-12-22 NOTE — Assessment & Plan Note (Signed)
ED eval 12/21/21 for left ankle fracture, soft brace applied. The patient was assisted to the floor while getting off toilet, noted the patient's left foot rotated outward. NWB LLE, f/u Ortho,

## 2021-12-22 NOTE — Assessment & Plan Note (Signed)
Will hold or dc Depakote, hold Norco if sedated, may resume Furosemide when the patient is awake and oral intake returns to her baseline.

## 2021-12-22 NOTE — Assessment & Plan Note (Signed)
MMSE 27/30, SNF for supportive care. Vit B12 324 07/03/21, on Vit B12 1086mg qd.

## 2021-12-22 NOTE — Assessment & Plan Note (Signed)
Insomnia/OSA, not using CPAP,  Lorazepam helps.              Depression, better emotional/teary,  off Sertraline 2/2 confusion. Reduced Lexapro '5mg'$ , added Cymbalta '30mg'$  qd 12/17/21, prn Lorazepam, added Depakote '125mg'$  bid 12/18/21

## 2021-12-22 NOTE — Assessment & Plan Note (Signed)
stable, Hx of CABG Cath 04/25/19, prn NTG, takes Atorvastatin. LDL 74 04/05/21

## 2021-12-22 NOTE — Progress Notes (Unsigned)
Location:   [facility] Room Number: 5 Place of Service:  SNF (31) Provider:  Mast, [name], NP  Mast, [name], NP  Patient Care Team: Mast, [name], NP as PCP - General (Internal Medicine) Leonie Man, MD as PCP - Cardiology (Cardiology) Mast, [name], NP as Nurse Practitioner (Internal Medicine) Lavonna Monarch, MD (Inactive) as Consulting Physician (Dermatology)  Extended Emergency Contact Information Primary Emergency Contact: Cheney Mobile Phone: [phone] Relation: Daughter Secondary Emergency Contact: Weeks,Tiffany Mobile Phone: [phone] Relation: Daughter Preferred language: English Interpreter needed? No  Code Status:  DNR Goals of care: Advanced Directive information    12/22/2021   11:49 AM  Advanced Directives  Does Patient Have a Medical Advance Directive? Yes  Type of Paramedic of [place];Living will;Out of facility DNR (pink MOST or yellow form)  Does patient want to make changes to medical advance directive? No - Patient declined  Copy of [place] in Chart? Yes - validated most recent copy scanned in chart (See row information)  Pre-existing out of facility DNR order (yellow form or pink MOST form) Pink MOST form placed in chart (order not valid for inpatient use);Yellow form placed in chart (order not valid for inpatient use)     Chief Complaint  Patient presents with   Acute Visit    ED follow up left lower leg injury    HPI:  Pt is a 88 y.o. female seen today for an acute visit for    Past Medical History:  Diagnosis Date   Atherosclerosis of native coronary artery 2001   LAD & Cx disease -- BMS PCI in 2001; CABG 2002.  PCI in 2006 (per-op TAH)   Atrial fibrillation and flutter ([place]) 04/2019   New diagnosis-major complaint was chest tightness and dyspnea.   Breast cancer ([place])    Cervix dysplasia    GERD (gastroesophageal reflux disease)    Heart attack ([place])  2002, 2006   Both events reportedly were perioperatively. She has no recollection. Initial PCI was related to angina symptoms (bilateral arm pain)   High cholesterol    Hypertension    Lumbosacral spinal stenosis    Osteoarthritis    Skin cancer    Sleep apnea    Uses CPAP faithfully   Squamous cell carcinoma of skin 02/12/2020   in situ- left neck-anterior ([id number])   Past Surgical History:  Procedure Laterality Date   ABDOMINAL HYSTERECTOMY     bone density     CARDIAC CATHETERIZATION  2001    Dr. Glade Lloyd [facility]) 2 vessel CAD involving LAD-D1 and distal Cx-OM. Appearance of dilated tapered left main with no significant atherosclerosis on IVUS --  BMS PCI of pLAD ([facility] BMS 3.0 mm x 15 mm) & with PTCA of ostial D1, PTCA of dLAD.  BMS PCI pCx [facility] BMS 3.5 mm x 11 mm) - unable to reopen dCx-OM (probable distal Atheroembolic occlusion.  Normal LVEF.   COLONOSCOPY     CORONARY ARTERY BYPASS GRAFT  2002   Unsure of details [facility], in [facility])   [place]  2001   ([place]) [place]ale BMS PCI pLAD (3.0 mm x 15 mm) -PTCA of jailed D1 as well as distal LAD; pCx ([facility]ale BMS 3.5 mm x 11 mm) -unable to open distalCx-OM -thromboembolic occlusion   DIAGNOSTIC MAMMOGRAM     LEFT HEART CATH AND CORS/GRAFTS ANGIOGRAPHY N/A 04/25/2019   Procedure: LEFT HEART CATH AND CORS/GRAFTS ANGIOGRAPHY;  Surgeon:  Troy Sine, MD;  Location: [facility] INVASIVE CV LAB;;; ost-prox LCx stent @ OM1 ostium =100% CTO, Ost-prox LAD stent ~30% ISR, Ost D1 CTO, prox RCA ~30%. SCG-OM2 patent with ostial stent 30%, LIMA-LAD patent; CTO of SVG-D1.    left knee replacement     MASTECTOMY     NM MYOVIEW LTD  12/2014; 2018   Cardiologist (Dr. Kellie Simmering.  [place], [place] ph# [phone]) -->LEXISCAN: Nonischemic, "normal "   pap smear     tonsilectomy     TRANSTHORACIC ECHOCARDIOGRAM  12/2014   Echocardiogram 12/25/14- normal LVEF and systolic function, EF 17-51%, mild grade 1 diastolic dysfunction    TRANSTHORACIC ECHOCARDIOGRAM  04/24/2019   EF 65 to 70%.  Moderate septal LVH.  GR 1 DD-elevated EDP.  Normal PAP.  Relatively normal valves.  No aortic stenosis.    Allergies  Allergen Reactions   Codeine Nausea Only    Allergies as of 12/22/2021       Reactions   Codeine Nausea Only        Medication List        Accurate as of December 22, 2021 12:13 PM. If you have any questions, ask your nurse or doctor.          STOP taking these medications    acetaminophen 500 MG tablet Commonly known as: TYLENOL Stopped by: [name] Mast, NP   methocarbamol 500 MG tablet Commonly known as: ROBAXIN Stopped by: [name] Mast, NP   traMADol 50 MG tablet Commonly known as: ULTRAM Stopped by: [name] Mast, NP       TAKE these medications    amiodarone 100 MG tablet Commonly known as: PACERONE Take 100 mg by mouth daily.   AMLACTIN EX thin layer topically; topical Once A Day - PRN Special Instructions: May apply topically once daily PRN for dry skin.   apixaban 5 MG Tabs tablet Commonly known as: Eliquis Take 1 tablet (5 mg total) by mouth 2 (two) times daily.   atorvastatin 80 MG tablet Commonly known as: LIPITOR Take 1 tablet (80 mg total) by mouth daily.   cyanocobalamin 1000 MCG tablet Commonly known as: VITAMIN B12 Take 1,000 mcg by mouth daily.   divalproex 125 MG DR tablet Commonly known as: DEPAKOTE Take 125 mg by mouth 2 (two) times daily.   docusate sodium 100 MG capsule Commonly known as: COLACE Take 1 capsule (100 mg total) by mouth daily.   DULoxetine 30 MG capsule Commonly known as: Cymbalta Take 1 capsule (30 mg total) by mouth daily.   escitalopram 5 MG tablet Commonly known as: Lexapro Take 1 tablet (5 mg total) by mouth daily.   fexofenadine 180 MG tablet Commonly known as: ALLEGRA Take 1 tablet (180 mg total) by mouth daily as needed for allergies or rhinitis.   fluticasone 50 MCG/ACT nasal spray Commonly known as: FLONASE Place 1  spray into both nostrils daily as needed for allergies or rhinitis.   furosemide 20 MG tablet Commonly known as: LASIX Take 20 mg by mouth daily.   HYDROcodone-acetaminophen 5-325 MG tablet Commonly known as: NORCO/VICODIN Take 1 tablet by mouth every 6 (six) hours.   LORazepam 0.5 MG tablet Commonly known as: ATIVAN Take 0.5 mg by mouth every 8 (eight) hours.   losartan 25 MG tablet Commonly known as: COZAAR Take 25 mg by mouth daily.   melatonin 5 MG Tabs Take 5 mg by mouth at bedtime.   metoprolol succinate 50 MG 24 hr tablet Commonly known  as: TOPROL-XL Take 1 tablet (50 mg total) by mouth daily. Take with or immediately following a meal.   nitroGLYCERIN 0.4 MG SL tablet Commonly known as: NITROSTAT Place 0.4 mg under the tongue as needed for chest pain (Every 5 minutes x 3 doses).   nystatin powder Commonly known as: MYCOSTATIN/NYSTOP Apply 1 Application topically 2 (two) times daily.   omeprazole 20 MG tablet Commonly known as: PRILOSEC OTC Take 1 tablet (20 mg total) by mouth daily as needed.   polyethylene glycol 17 g packet Commonly known as: MIRALAX / GLYCOLAX Take 17 g by mouth daily. Give with 4 oz of water.   potassium chloride 10 MEQ tablet Commonly known as: KLOR-CON M Take 10 mEq by mouth daily.   Salonpas Pain Relieving 4 % Generic drug: lidocaine Place 1 patch onto the skin daily.   senna-docusate 8.6-50 MG tablet Commonly known as: Senokot-S Take 2 tablets by mouth at bedtime.   Vitamin D3 50 MCG (2000 UT) Tabs Take 2,000 Units by mouth daily after breakfast.   zinc oxide 20 % ointment Apply 1 application topically 3 (three) times daily as needed for irritation. Apply to buttocks after every incontinent episode and as needed for redness.        Review of Systems  Immunization History  Administered Date(s) Administered   Fluad Quad(high Dose 65+) 10/28/2020   Influenza, High Dose Seasonal PF 10/21/2016, 09/24/2017, 10/25/2019    Influenza-Unspecified 10/03/2011, 10/04/2012, 01/13/2016, 11/05/2021   Moderna SARS-COV2 Booster Vaccination 11/21/2019, 06/11/2020, 05/30/2021   Moderna Sars-Covid-2 Vaccination 01/16/2019, 02/13/2019, 03/13/2019   PNEUMOCOCCAL CONJUGATE-20 07/03/2021   Pneumococcal-Unspecified 01/13/2016   Tetanus 12/01/2017   Zoster Recombinat (Shingrix) 08/24/2018, 02/22/2020   Pertinent  Health Maintenance Due  Topic Date Due   HEMOGLOBIN A1C  12/01/2019   OPHTHALMOLOGY EXAM  05/30/2021   FOOT EXAM  10/17/2021   INFLUENZA VACCINE  Completed   DEXA SCAN  Completed      02/07/2021   12:47 PM 06/19/2021   12:11 AM 09/11/2021    3:39 PM 12/04/2021    4:51 AM 12/17/2021    4:07 PM  Fall Risk  Falls in the past year? 1  0  1  Was there an injury with Fall? 0  0  1  Fall Risk Category Calculator 1  0  3  Fall Risk Category Low  Low  High  Patient Fall Risk Level Moderate fall risk Moderate fall risk Low fall risk High fall risk High fall risk  Patient at Risk for Falls Due to History of fall(s);Impaired balance/gait;Impaired mobility;Orthopedic patient  No Fall Risks  History of fall(s)  Patient at Risk for Falls Due to - Comments spinal stenosis of lumbar      Fall risk Follow up Falls evaluation completed;Education provided;Falls prevention discussed  Falls evaluation completed  Falls evaluation completed   Functional Status Survey:    Vitals:   12/22/21 1146  BP: (!) 131/59  Pulse: 67  Resp: 17  Temp: 97.7 F (36.5 C)  SpO2: 90%  Weight: 189 lb 9.6 oz (86 kg)  Height: '5\' 8"'$  (1.727 m)   Body mass index is 28.83 kg/m. Physical Exam  Labs reviewed: Recent Labs    06/19/21 0006 06/19/21 0010 08/26/21 0000 10/21/21 0630 12/04/21 0641  NA 140 141 141 140 138  K 4.1 4.0 4.1 4.3 4.2  CL 104 103 103 102 105  CO2 28  --  29* 30* 25  GLUCOSE 108* 106*  --   --  112*  BUN '14 17 17 13 15  '$ CREATININE 0.85 0.80 0.9 0.8 0.84  CALCIUM 9.8  --  10.0 10.0 9.7  MG  --   --   --   --  2.1    Recent Labs    03/26/21 0000 12/04/21 0641  AST 15 20  ALT 12 16  ALKPHOS 47 54  BILITOT  --  0.9  PROT  --  7.4  ALBUMIN 3.8 3.6   Recent Labs    03/26/21 0000 05/01/21 0000 06/19/21 0006 06/19/21 0010 12/04/21 0641  WBC 6.5 6.9 7.2  --  8.7  NEUTROABS 3,627.00  --  3.5  --  5.3  HGB 11.9* 11.7* 13.2 13.9 12.9  HCT 35* 35* 41.8 41.0 39.4  MCV  --   --  105.0*  --  102.1*  PLT 226 221 216  --  249   Lab Results  Component Value Date   TSH 1.390 07/03/2021   Lab Results  Component Value Date   HGBA1C 5.9 (H) 05/31/2019   Lab Results  Component Value Date   CHOL 175 07/03/2021   HDL 59 07/03/2021   LDLCALC 91 07/03/2021   TRIG 146 07/03/2021   CHOLHDL 2.4 04/05/2020    Significant Diagnostic Results in last 30 days:  CT ABDOMEN PELVIS W CONTRAST  Result Date: 12/04/2021 CLINICAL DATA:  Left lower quadrant abdominal pain. EXAM: CT ABDOMEN AND PELVIS WITH CONTRAST TECHNIQUE: Multidetector CT imaging of the abdomen and pelvis was performed using the standard protocol following bolus administration of intravenous contrast. RADIATION DOSE REDUCTION: This exam was performed according to the departmental dose-optimization program which includes automated exposure control, adjustment of the mA and/or kV according to patient size and/or use of iterative reconstruction technique. CONTRAST:  162m OMNIPAQUE IOHEXOL 300 MG/ML  SOLN COMPARISON:  None Available. FINDINGS: Lower chest: Patchy airspace opacities are present at the lung bases, left greater than right. No significant effusions are present. Hepatobiliary: Liver is unremarkable. Layering gallstones are present without inflammatory change. The common bile duct is within normal limits for age. Pancreas: Unremarkable. No pancreatic ductal dilatation or surrounding inflammatory changes. Spleen: Normal in size without focal abnormality. Adrenals/Urinary Tract: The adrenal glands are within normal limits bilaterally. An 8.8 cm  simple exophytic cyst emanates from the posterior aspect of the left kidney. Kidneys are otherwise unremarkable. No parenchymal lesion or stone is present. No obstruction is present. Ureters are within normal limits. The urinary bladder is normal. Stomach/Bowel: The stomach is within normal limits. Duodenal ulcer noted without inflammatory change. Small bowel is un remarkable proximally. Distal small bowel herniates into the right inguinal canal without obstruction. Terminal ileum is within normal limits. Appendix is visualized and within normal limits. Ascending colon is normal. Moderate stool is present throughout the transverse colon without obstruction. No inflammation is present. Descending colon is normal. Sigmoid colon is somewhat redundant without focal inflammation or obstruction. Vascular/Lymphatic: Atherosclerotic calcifications are present in the aorta and branch vessels. No aneurysm is present. No significant adenopathy is present. Reproductive: Status post hysterectomy. No adnexal masses. Other: Bilateral paraumbilical hernias contain fat without bowel. Right inguinal hernia contains a loop of small bowel as described above. A left paramedian ventral hernia anterior to the stomach contains fat without bowel. The opening measures 6 mm. No free fluid or free air is present. Musculoskeletal: Grade 1 degenerative anterolisthesis is present at L4-5 and L5-S1. An inferior endplate fracture at L2 is age indeterminate. No other acute fractures are present. Bony  pelvis is within normal limits. The hips are located and within normal limits. IMPRESSION: 1. No acute or focal lesion to explain the patient's left lower quadrant abdominal pain. 2. Right inguinal hernia contains a loop of small bowel without obstruction. 3. Bilateral paraumbilical hernias contain fat without bowel. 4. Left paramedian ventral hernia anterior to the stomach contains fat without bowel. 5. Cholelithiasis without evidence of cholecystitis.  6. Patchy airspace opacities at the lung bases, left greater than right. This may represent atelectasis or infection. 7. Age indeterminate inferior endplate fracture at L2. 8. Grade 1 degenerative anterolisthesis at L4-5 and L5-S1. 9. 8.8 cm simple exophytic cyst emanates from the posterior aspect of the left kidney. No follow-up imaging is recommended. JACR 2018 Feb; 264-273, Management of the Incidental Renal Mass on CT, [employer] 2021; 814-848, Bosniak Classification of Cystic Renal Masses, Version 2019. 10.  Aortic Atherosclerosis (ICD10-I70.0). Electronically Signed   By: San Morelle M.D.   On: 12/04/2021 08:39   DG Shoulder Right  Result Date: 12/04/2021 CLINICAL DATA:  Persistent shoulder pain after falling several months previously EXAM: RIGHT SHOULDER - 2+ VIEW COMPARISON:  Prior chest x-ray 04/23/2019 FINDINGS: No acute fracture or malalignment. Degenerative osteoarthritis is present at the acromioclavicular joint. Rounded calcification overlies the right chest. This may be within the breast, or the lung but is unchanged compared to prior imaging from 4585 and almost certainly benign. Chronic bronchitic changes visualized in the lung. Mild degenerative changes at the rotator cuff insertion. IMPRESSION: 1. Degenerative osteoarthritis at the acromioclavicular joint. 2. Degenerative changes at the rotator cuff insertion. 3. No acute fracture or malalignment. Electronically Signed   By: Jacqulynn Cadet M.D.   On: 12/04/2021 07:14    Assessment/Plan There are no diagnoses linked to this encounter.   Family/ staff Communication:   Labs/tests ordered:

## 2021-12-22 NOTE — Assessment & Plan Note (Signed)
Blood pressure is controlled,  takes Losartan, Metoprolol.

## 2021-12-22 NOTE — Assessment & Plan Note (Signed)
R inguinal hernia  w/o obstruction or gangrene-no further c/o abd pain, surgeon consultation 12/19/21, f/u as needed.

## 2021-12-22 NOTE — Assessment & Plan Note (Signed)
stable, on Senna, MiraLax.

## 2021-12-22 NOTE — Progress Notes (Unsigned)
Location:   [facility] Room Number: 45 Place of Service:  SNF (31) Provider: Lennie Odor Davidjames Blansett NP  [place] Czerwinski X, NP  Patient Care Team: Elroy Schembri X, NP as PCP - General (Internal Medicine) Leonie Waldron Gerry, MD as PCP - Cardiology (Cardiology) [place] Kievit X, NP as Nurse Practitioner (Internal Medicine) Lavonna Monarch, MD (Inactive) as Consulting Physician (Dermatology)  Extended Emergency Contact Information Primary Emergency Contact: [place] Mobile Phone: [phone] Relation: Daughter Secondary Emergency Contact: Weeks,Tiffany Mobile Phone: [phone] Relation: Daughter Preferred language: English Interpreter needed? No  Code Status: DNR Goals of care: Advanced Directive information    12/22/2021   11:49 AM  Advanced Directives  Does Patient Have a Medical Advance Directive? Yes  Type of Paramedic of [place];Living will;Out of facility DNR (pink MOST or yellow form)  Does patient want to make changes to medical advance directive? No - Patient declined  Copy of [place] in Chart? Yes - validated most recent copy scanned in chart (See row information)  Pre-existing out of facility DNR order (yellow form or pink MOST form) Pink MOST form placed in chart (order not valid for inpatient use);Yellow form placed in chart (order not valid for inpatient use)     Chief Complaint  Patient presents with   Acute Visit    ED follow up left lower leg injury    HPI:  Pt is a 88 y.o. female seen today for an acute visit for f/u ED eval 12/21/21 for left ankle fracture, soft brace applied. The patient was assisted to the floor while getting off toilet, noted the patient's left foot rotated outward. NWB LLE, f/u Ortho,  ED eval 12/04/21 for pain R shoulder-X-ray showed no acute fx, L hip, LLQ pain-CT abd identified inguinal hernia  w/o obstruction or gangrene-no further c/o abd pain. OA pain is managed with Norco. HPOA daughter  desires comfort measures presently.              R inguinal hernia  w/o obstruction or gangrene-no further c/o abd pain, surgeon consultation 12/19/21             TIA, stable, on Eliquis, Atorvastatin.  CVA, Neurology: cerebral infarction,MRI chronic right cerebellar stroke, mild to moderate generalized atrophy, small vessel disease with multiple chronic lacunar infarcts, possible left sided stroke not seen on imaging-saw 2 fingers in front of her upon my examination,  numbness in right side of tongue, R sided deficits,  transient vision disturbance of R eye.  OA, left knee s/p TKR, MR lumbar, severe spinal 11/13/20 spinal and foraminal stenosis with left sciatica, Pain in the left shoulder, hip, knee, takes Norco 10/'325mg'$  q6hr, q6hr prn, better controlled             Constipation, stable, on Senna, MiraLax.              Afib, takes Metoprolol, Eliquis, Amiodarone, f/u cardiology. TSH 1.39 07/03/21             HTN, takes Losartan, Metoprolol.              GERD, stable, takes Omeprazole, Hgb 12.9 12/04/21             CAD, stable, Hx of CABG Cath 04/25/19, prn NTG, takes Atorvastatin. LDL 74 04/05/21             CKD Bun/creat 15/0.84 12/04/21             Insomnia/OSA, not using CPAP,  Lorazepam  helps.              Depression, better emotional/teary,  off Sertraline 2/2 confusion. Reduced Lexapro '5mg'$ , added Cymbalta '30mg'$  qd 12/17/21, prn Lorazepam, added Depakote '125mg'$  bid 12/18/21             Vascular cognitive impairment, MMSE 27/30, SNF for supportive care. Vit B12 324 07/03/21, on Vit B12 1047mg qd.              BLE edema, EF 60% 08/03/21, 12/15/21, improving gradually, resumed Furosemide   Past Medical History:  Diagnosis Date   Atherosclerosis of native coronary artery 2001   LAD & Cx disease -- BMS PCI in 2001; CABG 2002.  PCI in 2006 (per-op [facility])   [place] 04[place]   New diagnosis-major complaint was chest tightness and dyspnea.   Breast cancer ([facility]    Cervix  dysplasia    GERD (gastroesophageal reflux disease)    [place], [place]   Both events reportedly were perioperatively. She has no recollection. Initial PCI was related to angina symptoms (bilateral arm pain)   High cholesterol    Hypertension    Lumbosacral spinal stenosis    Osteoarthritis    Skin cancer    Sleep apnea    Uses CPAP faithfully   Squamous cell carcinoma of skin 02/12/2020   in situ- left neck-anterior ([id number])   Past Surgical History:  Procedure Laterality Date   ABDOMINAL HYSTERECTOMY     bone density     CARDIAC CATHETERIZATION  2001    Dr. TGlade Lloyd([facility] 2 vessel CAD involving LAD-D1 and distal Cx-OM. Appearance of dilated tapered left main with no significant atherosclerosis on IVUS --  BMS PCI of pLAD ([facility] BMS 3.0 mm x 15 mm) & with PTCA of ostial D1, PTCA of dLAD.  [facility] - unable to reopen dCx-OM (probable distal Atheroembolic occlusion.  Normal LVEF.   COLONOSCOPY     CORONARY ARTERY BYPASS GRAFT  2002   Unsure of details [facility]   [place]   () [facility] BMS PCI pLAD (3.0 mm x 15 mm) -PTCA of jailed D1 as well as distal LAD; pCx ([facility] BMS 3.5 mm x 11 mm) -unable to open distalCx-OM -thromboembolic occlusion   DIAGNOSTIC MAMMOGRAM     LEFT HEART CATH AND CORS/GRAFTS ANGIOGRAPHY N/A 04/25/2019   Procedure: LEFT HEART CATH AND CORS/GRAFTS ANGIOGRAPHY;  Surgeon: KTroy Sine MD;  [facility]: [facility] IN[facility];;; ost-prox LCx stent @ OM1 ostium =100% CTO, Ost-prox LAD stent ~30% ISR, Ost D1 CTO, prox RCA ~30%. SCG-OM2 patent with ostial stent 30%, LIMA-LAD patent; CTO of SVG-D1.    left knee replacement     MASTECTOMY     NM MYOVIEW LTD  12/2014; 2018   Cardiologist (Dr. GKellie Simmering  [place], [place] [phone] -->LEXISCAN: Nonischemic, "normal "   pap smear     tonsilectomy     TRANSTHORACIC ECHOCARDIOGRAM  12/2014   Echocardiogram 12/25/14- normal LVEF  and systolic function, EF 509-81% mild grade 1 diastolic dysfunction   TRANSTHORACIC ECHOCARDIOGRAM  04/24/2019   EF 65 to 70%.  Moderate septal LVH.  GR 1 DD-elevated EDP.  Normal PAP.  Relatively normal valves.  No aortic stenosis.    Allergies  Allergen Reactions   Codeine Nausea Only    Allergies as of 12/22/2021       Reactions  Codeine Nausea Only        Medication List        Accurate as of December 22, 2021 12:43 PM. If you have any questions, ask your nurse or doctor.          STOP taking these medications    acetaminophen 500 MG tablet Commonly known as: TYLENOL Stopped by: Hill Mackie X Renita Brocks, NP   methocarbamol 500 MG tablet Commonly known as: ROBAXIN Stopped by: Bobi Daudelin X Bita Cartwright, NP   traMADol 50 MG tablet Commonly known as: ULTRAM Stopped by: Tahj Njoku X Demetrice Amstutz, NP       TAKE these medications    amiodarone 100 MG tablet Commonly known as: PACERONE Take 100 mg by mouth daily.   AMLACTIN EX thin layer topically; topical Once A Day - PRN Special Instructions: May apply topically once daily PRN for dry skin.   apixaban 5 MG Tabs tablet Commonly known as: Eliquis Take 1 tablet (5 mg total) by mouth 2 (two) times daily.   atorvastatin 80 MG tablet Commonly known as: LIPITOR Take 1 tablet (80 mg total) by mouth daily.   cyanocobalamin 1000 MCG tablet Commonly known as: VITAMIN B12 Take 1,000 mcg by mouth daily.   divalproex 125 MG DR tablet Commonly known as: DEPAKOTE Take 125 mg by mouth 2 (two) times daily.   docusate sodium 100 MG capsule Commonly known as: COLACE Take 1 capsule (100 mg total) by mouth daily.   DULoxetine 30 MG capsule Commonly known as: Cymbalta Take 1 capsule (30 mg total) by mouth daily.   escitalopram 5 MG tablet Commonly known as: Lexapro Take 1 tablet (5 mg total) by mouth daily.   fexofenadine 180 MG tablet Commonly known as: ALLEGRA Take 1 tablet (180 mg total) by mouth daily as needed for allergies or rhinitis.    fluticasone 50 MCG/ACT nasal spray Commonly known as: FLONASE Place 1 spray into both nostrils daily as needed for allergies or rhinitis.   furosemide 20 MG tablet Commonly known as: LASIX Take 20 mg by mouth daily.   HYDROcodone-acetaminophen 5-325 MG tablet Commonly known as: NORCO/VICODIN Take 1 tablet by mouth every 6 (six) hours.   LORazepam 0.5 MG tablet Commonly known as: ATIVAN Take 0.5 mg by mouth every 8 (eight) hours.   losartan 25 MG tablet Commonly known as: COZAAR Take 25 mg by mouth daily.   melatonin 5 MG Tabs Take 5 mg by mouth at bedtime.   metoprolol succinate 50 MG 24 hr tablet Commonly known as: TOPROL-XL Take 1 tablet (50 mg total) by mouth daily. Take with or immediately following a meal.   nitroGLYCERIN 0.4 MG SL tablet Commonly known as: NITROSTAT Place 0.4 mg under the tongue as needed for chest pain (Every 5 minutes x 3 doses).   nystatin powder Commonly known as: MYCOSTATIN/NYSTOP Apply 1 Application topically 2 (two) times daily.   omeprazole 20 MG tablet Commonly known as: PRILOSEC OTC Take 1 tablet (20 mg total) by mouth daily as needed.   polyethylene glycol 17 g packet Commonly known as: MIRALAX / GLYCOLAX Take 17 g by mouth daily. Give with 4 oz of water.   potassium chloride 10 MEQ tablet Commonly known as: KLOR-CON M Take 10 mEq by mouth daily.   Salonpas Pain Relieving 4 % Generic drug: lidocaine Place 1 patch onto the skin daily.   senna-docusate 8.6-50 MG tablet Commonly known as: Senokot-S Take 2 tablets by mouth at bedtime.   Vitamin D3 50 MCG (2000 UT) Tabs  Take 2,000 Units by mouth daily after breakfast.   zinc oxide 20 % ointment Apply 1 application topically 3 (three) times daily as needed for irritation. Apply to buttocks after every incontinent episode and as needed for redness.        Review of Systems  Constitutional:  Positive for activity change, appetite change and fatigue. Negative for fever.   HENT:  Negative for congestion, hearing loss and voice change.   Eyes:  Negative for visual disturbance.       C/o eye sight issue upon first awake in am, mostly R eye, better as day goes on.   Respiratory:  Negative for cough, chest tightness, shortness of breath and wheezing.   Cardiovascular:  Positive for leg swelling.  Gastrointestinal:  Negative for abdominal pain, blood in stool, constipation, nausea and vomiting.  Genitourinary:  Negative for dysuria and urgency.  Musculoskeletal:  Positive for arthralgias, back pain and gait problem.       S/p L TKR,  left hip, left knee, R+L shoulder pain.   Skin:  Positive for color change.       Left lower leg bruise, mild warmth and redness, tenderness, increases swelling, dorsalis pedis pulse present.    Neurological:  Positive for numbness. Negative for speech difficulty and headaches.       Memory lapses. C/o numbness right side of tongue.   Hematological:  Does not bruise/bleed easily.  Psychiatric/Behavioral:  Positive for sleep disturbance. Negative for behavioral problems and dysphoric mood. The patient is nervous/anxious.        Better    Immunization History  Administered Date(s) Administered   Fluad Quad(high Dose 65+) 10/28/2020   Influenza, High Dose Seasonal PF 10/21/2016, 09/24/2017, 10/25/2019   Influenza-Unspecified 10/03/2011, 10/04/2012, 01/13/2016, 11/05/2021   Moderna SARS-COV2 Booster Vaccination 11/21/2019, 06/11/2020, 05/30/2021   Moderna Sars-Covid-2 Vaccination 01/16/2019, 02/13/2019, 03/13/2019   PNEUMOCOCCAL CONJUGATE-20 07/03/2021   Pneumococcal-Unspecified 01/13/2016   Tetanus 12/01/2017   Zoster Recombinat (Shingrix) 08/24/2018, 02/22/2020   Pertinent  Health Maintenance Due  Topic Date Due   HEMOGLOBIN A1C  12/01/2019   OPHTHALMOLOGY EXAM  05/30/2021   FOOT EXAM  10/17/2021   INFLUENZA VACCINE  Completed   DEXA SCAN  Completed      02/07/2021   12:47 PM 06/19/2021   12:11 AM 09/11/2021    3:39 PM  12/04/2021    4:51 AM 12/17/2021    4:07 PM  Fall Risk  Falls in the past year? 1  0  1  Was there an injury with Fall? 0  0  1  Fall Risk Category Calculator 1  0  3  Fall Risk Category Low  Low  High  Patient Fall Risk Level Moderate fall risk Moderate fall risk Low fall risk High fall risk High fall risk  Patient at Risk for Falls Due to History of fall(s);Impaired balance/gait;Impaired mobility;Orthopedic patient  No Fall Risks  History of fall(s)  Patient at Risk for Falls Due to - Comments spinal stenosis of lumbar      Fall risk Follow up Falls evaluation completed;Education provided;Falls prevention discussed  Falls evaluation completed  Falls evaluation completed   Functional Status Survey:    Vitals:   12/22/21 1146  BP: (!) 131/59  Pulse: 67  Resp: 17  Temp: 97.7 F (36.5 C)  SpO2: 90%  Weight: 189 lb 9.6 oz (86 kg)  Height: '5\' 8"'$  (1.727 m)   Body mass index is 28.83 kg/m. Physical Exam Constitutional:  Appearance: Normal appearance.  HENT:     Head: Normocephalic and atraumatic.     Nose: Nose normal.     Mouth/Throat:     Mouth: Mucous membranes are moist.  Eyes:     Extraocular Movements: Extraocular movements intact.     Pupils: Pupils are equal, round, and reactive to light.  Cardiovascular:     Rate and Rhythm: Normal rate and regular rhythm.     Heart sounds: Murmur heard.  Pulmonary:     Effort: Pulmonary effort is normal.     Breath sounds: Rales present.     Comments: Bibasilar rales present Abdominal:     General: Bowel sounds are normal.     Palpations: Abdomen is soft.     Tenderness: There is no abdominal tenderness. There is no right CVA tenderness, left CVA tenderness, guarding or rebound.     Hernia: A hernia is present.     Comments: R inguinal hernia per CT abd.   Musculoskeletal:     Cervical back: Normal range of motion and neck supple.     Right lower leg: Edema present.     Left lower leg: Edema present.     Comments:  2+edema LLE, 1+ edema RLE.  Slightly R arm weakness. persisted right shoulder/arm aches. No decreased ROM. Chronic pain in the left hip, knee, and aches in the left shoulder-no redness, swelling, deformity, or decreased ROM-except preexisting limited over head ROM. Pain in the left lower leg and ankle noted.   Skin:    General: Skin is warm and dry.     Findings: Bruising and erythema present.     Comments: S/p right mastectomy.  Left lower leg bruise, mild warmth and redness, tenderness, increases swelling, dorsalis pedis pulse present.  RLE mild erythematous and swelling, slightly better after Furosemide resumed.   Neurological:     General: No focal deficit present.     Mental Status: She is alert and oriented to person, place, and time. Mental status is at baseline.     Motor: Weakness present.     Coordination: Coordination normal.     Gait: Gait abnormal.     Comments: C/o numbness of the right side of tongue. Saw 2 fingers in front of her upon my examination left eye.   Psychiatric:        Mood and Affect: Mood normal.        Behavior: Behavior normal.        Thought Content: Thought content normal.     Labs reviewed: Recent Labs    06/19/21 0006 06/19/21 0010 08/26/21 0000 10/21/21 0630 12/04/21 0641  NA 140 141 141 140 138  K 4.1 4.0 4.1 4.3 4.2  CL 104 103 103 102 105  CO2 28  --  29* 30* 25  GLUCOSE 108* 106*  --   --  112*  BUN '14 17 17 13 15  '$ CREATININE 0.85 0.80 0.9 0.8 0.84  CALCIUM 9.8  --  10.0 10.0 9.7  MG  --   --   --   --  2.1   Recent Labs    03/26/21 0000 12/04/21 0641  AST 15 20  ALT 12 16  ALKPHOS 47 54  BILITOT  --  0.9  PROT  --  7.4  ALBUMIN 3.8 3.6   Recent Labs    03/26/21 0000 05/01/21 0000 06/19/21 0006 06/19/21 0010 12/04/21 0641  WBC 6.5 6.9 7.2  --  8.7  NEUTROABS 3,627.00  --  3.5  --  5.3  HGB 11.9* 11.7* 13.2 13.9 12.9  HCT 35* 35* 41.8 41.0 39.4  MCV  --   --  105.0*  --  102.1*  PLT 226 221 216  --  249   Lab  Results  Component Value Date   TSH 1.390 07/03/2021   Lab Results  Component Value Date   HGBA1C 5.9 (H) 05/31/2019   Lab Results  Component Value Date   CHOL 175 07/03/2021   HDL 59 07/03/2021   LDLCALC 91 07/03/2021   TRIG 146 07/03/2021   CHOLHDL 2.4 04/05/2020    Significant Diagnostic Results in last 30 days:  CT ABDOMEN PELVIS W CONTRAST  Result Date: 12/04/2021 CLINICAL DATA:  Left lower quadrant abdominal pain. EXAM: CT ABDOMEN AND PELVIS WITH CONTRAST TECHNIQUE: Multidetector CT imaging of the abdomen and pelvis was performed using the standard protocol following bolus administration of intravenous contrast. RADIATION DOSE REDUCTION: This exam was performed according to the departmental dose-optimization program which includes automated exposure control, adjustment of the mA and/or kV according to patient size and/or use of iterative reconstruction technique. CONTRAST:  147m OMNIPAQUE IOHEXOL 300 MG/ML  SOLN COMPARISON:  None Available. FINDINGS: Lower chest: Patchy airspace opacities are present at the lung bases, left greater than right. No significant effusions are present. Hepatobiliary: Liver is unremarkable. Layering gallstones are present without inflammatory change. The common bile duct is within normal limits for age. Pancreas: Unremarkable. No pancreatic ductal dilatation or surrounding inflammatory changes. Spleen: Normal in size without focal abnormality. Adrenals/Urinary Tract: The adrenal glands are within normal limits bilaterally. An 8.8 cm simple exophytic cyst emanates from the posterior aspect of the left kidney. Kidneys are otherwise unremarkable. No parenchymal lesion or stone is present. No obstruction is present. Ureters are within normal limits. The urinary bladder is normal. Stomach/Bowel: The stomach is within normal limits. Duodenal ulcer noted without inflammatory change. Small bowel is un remarkable proximally. Distal small bowel herniates into the right  inguinal canal without obstruction. Terminal ileum is within normal limits. Appendix is visualized and within normal limits. Ascending colon is normal. Moderate stool is present throughout the transverse colon without obstruction. No inflammation is present. Descending colon is normal. Sigmoid colon is somewhat redundant without focal inflammation or obstruction. Vascular/Lymphatic: Atherosclerotic calcifications are present in the aorta and branch vessels. No aneurysm is present. No significant adenopathy is present. Reproductive: Status post hysterectomy. No adnexal masses. Other: Bilateral paraumbilical hernias contain fat without bowel. Right inguinal hernia contains a loop of small bowel as described above. A left paramedian ventral hernia anterior to the stomach contains fat without bowel. The opening measures 6 mm. No free fluid or free air is present. Musculoskeletal: Grade 1 degenerative anterolisthesis is present at L4-5 and L5-S1. An inferior endplate fracture at L2 is age indeterminate. No other acute fractures are present. Bony pelvis is within normal limits. The hips are located and within normal limits. IMPRESSION: 1. No acute or focal lesion to explain the patient's left lower quadrant abdominal pain. 2. Right inguinal hernia contains a loop of small bowel without obstruction. 3. Bilateral paraumbilical hernias contain fat without bowel. 4. Left paramedian ventral hernia anterior to the stomach contains fat without bowel. 5. Cholelithiasis without evidence of cholecystitis. 6. Patchy airspace opacities at the lung bases, left greater than right. This may represent atelectasis or infection. 7. Age indeterminate inferior endplate fracture at L2. 8. Grade 1 degenerative anterolisthesis at L4-5 and L5-S1. 9. 8.8 cm simple exophytic cyst emanates  from the posterior aspect of the left kidney. No follow-up imaging is recommended. JACR 2018 Feb; 264-273, Management of the Incidental Renal Mass on CT,  [employer] 2021; 814-848, Bosniak Classification of Cystic Renal Masses, Version 2019. 10.  Aortic Atherosclerosis (ICD10-I70.0). Electronically Signed   By: San Morelle M.D.   On: 12/04/2021 08:39   DG Shoulder Right  Result Date: 12/04/2021 CLINICAL DATA:  Persistent shoulder pain after falling several months previously EXAM: RIGHT SHOULDER - 2+ VIEW COMPARISON:  Prior chest x-ray 04/23/2019 FINDINGS: No acute fracture or malalignment. Degenerative osteoarthritis is present at the acromioclavicular joint. Rounded calcification overlies the right chest. This may be within the breast, or the lung but is unchanged compared to prior imaging from 8032 and almost certainly benign. Chronic bronchitic changes visualized in the lung. Mild degenerative changes at the rotator cuff insertion. IMPRESSION: 1. Degenerative osteoarthritis at the acromioclavicular joint. 2. Degenerative changes at the rotator cuff insertion. 3. No acute fracture or malalignment. Electronically Signed   By: Jacqulynn Cadet M.D.   On: 12/04/2021 07:14    Assessment/Plan: Closed left ankle fracture ED eval 12/21/21 for left ankle fracture, soft brace applied. The patient was assisted to the floor while getting off toilet, noted the patient's left foot rotated outward. NWB LLE, f/u Ortho,    Osteoarthritis of multiple joints Pain in the left shoulder, hip, knee, takes Norco 10/'325mg'$  q6hr, q6hr prn, better controlled left knee s/p TKR, MR lumbar, severe spinal 11/13/20 spinal and foraminal stenosis with left sciatica, Norco relieves pain   Right inguinal hernia R inguinal hernia  w/o obstruction or gangrene-no further c/o abd pain, surgeon consultation 12/19/21, f/u as needed.   CVA (cerebral vascular accident) ([place]) TIA, stable, on Eliquis, Atorvastatin.  CVA, Neurology: cerebral infarction,MRI chronic right cerebellar stroke, mild to moderate generalized atrophy, small vessel disease with multiple chronic lacunar  infarcts, possible left sided stroke not seen on imaging-saw 2 fingers in front of her upon my examination,  numbness in right side of tongue, R sided deficits,  transient vision disturbance of R eye.   Constipation stable, on Senna, MiraLax.   Paroxysmal atrial fibrillation/flutter (HCC) takes Metoprolol, Eliquis, Amiodarone, f/u cardiology. TSH 1.39 07/03/21  Essential hypertension Blood pressure is controlled,  takes Losartan, Metoprolol.   Gastroesophageal reflux disease stable, takes Omeprazole, Hgb 12.9 12/04/21  Coronary artery disease involving coronary bypass graft of native heart with angina pectoris (HCC) stable, Hx of CABG Cath 04/25/19, prn NTG, takes Atorvastatin. LDL 74 04/05/21  CKD (chronic kidney disease) stage 3, GFR 30-59 ml/min (HCC) Bun/creat 15/0.84 12/04/21  Insomnia secondary to depression with anxiety Insomnia/OSA, not using CPAP,  Lorazepam helps.              Depression, better emotional/teary,  off Sertraline 2/2 confusion. Reduced Lexapro '5mg'$ , added Cymbalta '30mg'$  qd 12/17/21, prn Lorazepam, added Depakote '125mg'$  bid 12/18/21  Vascular dementia ([place]) MMSE 27/30, SNF for supportive care. Vit B12 324 07/03/21, on Vit B12 1039mg qd.   Edema EF 60% 08/03/21, 12/15/21, improving gradually, resumed Furosemide      Family/ staff Communication: plan of care reviewed with the patient and charge nurse.   Labs/tests ordered:  none  Time spend 35 minutes.

## 2021-12-22 NOTE — Assessment & Plan Note (Signed)
takes Metoprolol, Eliquis, Amiodarone, f/u cardiology. TSH 1.39 07/03/21

## 2021-12-22 NOTE — Assessment & Plan Note (Addendum)
Pain in the left shoulder, hip, knee, takes Norco 10/331m q6hr, q6hr prn, better controlled left knee s/p TKR, MR lumbar, severe spinal 11/13/20 spinal and foraminal stenosis with left sciatica, Norco relieves pain  12/23/21 wbc 9.1, Hgb 12.5, plt 248, neutrophils 71, Na 139, K 4.0, Bun 24, creat 0.86, total bilirubin 1.5, ESR 80, pending CRP.  UKorealiver, gallbladder, pancrease, update LFT 12/25/21

## 2021-12-22 NOTE — Assessment & Plan Note (Signed)
stable, takes Omeprazole, Hgb 12.9 12/04/21

## 2021-12-22 NOTE — Assessment & Plan Note (Signed)
TIA, stable, on Eliquis, Atorvastatin.  CVA, Neurology: cerebral infarction,MRI chronic right cerebellar stroke, mild to moderate generalized atrophy, small vessel disease with multiple chronic lacunar infarcts, possible left sided stroke not seen on imaging-saw 2 fingers in front of her upon my examination,  numbness in right side of tongue, R sided deficits,  transient vision disturbance of R eye.

## 2021-12-22 NOTE — Assessment & Plan Note (Signed)
Bun/creat 15/0.84 12/04/21

## 2021-12-23 ENCOUNTER — Telehealth: Payer: Self-pay | Admitting: Cardiology

## 2021-12-23 ENCOUNTER — Encounter: Payer: Self-pay | Admitting: Nurse Practitioner

## 2021-12-23 DIAGNOSIS — R29898 Other symptoms and signs involving the musculoskeletal system: Secondary | ICD-10-CM | POA: Diagnosis not present

## 2021-12-23 DIAGNOSIS — F419 Anxiety disorder, unspecified: Secondary | ICD-10-CM | POA: Diagnosis not present

## 2021-12-23 DIAGNOSIS — N189 Chronic kidney disease, unspecified: Secondary | ICD-10-CM | POA: Diagnosis not present

## 2021-12-23 DIAGNOSIS — Z1329 Encounter for screening for other suspected endocrine disorder: Secondary | ICD-10-CM | POA: Diagnosis not present

## 2021-12-23 DIAGNOSIS — M6281 Muscle weakness (generalized): Secondary | ICD-10-CM | POA: Diagnosis not present

## 2021-12-23 DIAGNOSIS — I2510 Atherosclerotic heart disease of native coronary artery without angina pectoris: Secondary | ICD-10-CM | POA: Diagnosis not present

## 2021-12-23 DIAGNOSIS — M25511 Pain in right shoulder: Secondary | ICD-10-CM | POA: Diagnosis not present

## 2021-12-23 DIAGNOSIS — F02A0 Dementia in other diseases classified elsewhere, mild, without behavioral disturbance, psychotic disturbance, mood disturbance, and anxiety: Secondary | ICD-10-CM | POA: Diagnosis not present

## 2021-12-23 NOTE — Telephone Encounter (Signed)
   Pre-operative Risk Assessment    Patient Name: Stephanie Shea  DOB: 02/08/1933 MRN: [id number]      Request for Surgical Clearance    Procedure:   lumbar interlaminar epidural steroid injections   Date of Surgery:  Clearance TBD                                 Surgeon:  Dr. Tenna Child Group or Practice Name:  [facility] Phone number:  [phone] Fax number:  [phone]   Type of Clearance Requested:   - Medical  - Pharmacy:  Hold Apixaban (Eliquis) 3 days   Type of Anesthesia:   Block    Additional requests/questions:   a  Eston Mould   12/23/2021, 8:37 AM

## 2021-12-23 NOTE — Addendum Note (Signed)
Addended by: Saralee Bolick X on: 12/23/2021 12:32 PM   Modules accepted: Level of Service

## 2021-12-24 NOTE — Telephone Encounter (Signed)
   Name: Tache Bobst  DOB: [birth year]  MRN: [id number]  Primary Cardiologist: Glenetta Hew, MD  Chart reviewed as part of pre-operative protocol coverage. Because of Kae Snellgrove's past medical history and time since last visit, she will require a follow-up in-office visit in order to better assess preoperative cardiovascular risk.  Pre-op covering staff: - Please schedule appointment and call patient to inform them. If patient already had an upcoming appointment within acceptable timeframe, please add "pre-op clearance" to the appointment notes so provider is aware. - Please contact requesting surgeon's office via preferred method (i.e, phone, fax) to inform them of need for appointment prior to surgery.  This message will also be routed to pharmacy poolfor input on holding Eliquis as requested below so that this information is available to the clearing provider at time of patient's appointment.   Loel Dubonnet, NP  12/24/2021, 10:06 AM

## 2021-12-24 NOTE — Telephone Encounter (Signed)
Patient on Eliquis for PAF. Will route to pharmacy team for input.    CHA2DS2-VASc Score = 6   This indicates a 9.7% annual risk of stroke. The patient's score is based upon: CHF History: 1 HTN History: 1 Diabetes History: 0 Stroke History: 0 Vascular Disease History: 1 Age Score: 2 Gender Score: 1     Platelet count: 12/04/2021: Platelets 249   Creatinine clearance:   12/04/2021: Creatinine, Ser 0.84; Hemoglobin 12.9   Serum creatinine: 0.84 mg/dL 12/04/21 0641 Estimated creatinine clearance: 55.2 mL/min  Loel Dubonnet, NP  12/24/21  10:05 AM

## 2021-12-24 NOTE — Telephone Encounter (Signed)
DPR ok to s/w the pt's daughter who is agreeable to plan of care to schedule an appt in office for pre op clearance for the pt. Appt has been scheduled for 12/31/21 @ 1:55 with Diona Browner, PAC.

## 2021-12-25 ENCOUNTER — Encounter: Payer: Self-pay | Admitting: Nurse Practitioner

## 2021-12-25 ENCOUNTER — Non-Acute Institutional Stay (SKILLED_NURSING_FACILITY): Payer: Medicare Other | Admitting: Nurse Practitioner

## 2021-12-25 DIAGNOSIS — N189 Chronic kidney disease, unspecified: Secondary | ICD-10-CM | POA: Diagnosis not present

## 2021-12-25 DIAGNOSIS — I639 Cerebral infarction, unspecified: Secondary | ICD-10-CM | POA: Diagnosis not present

## 2021-12-25 DIAGNOSIS — K219 Gastro-esophageal reflux disease without esophagitis: Secondary | ICD-10-CM | POA: Diagnosis not present

## 2021-12-25 DIAGNOSIS — R531 Weakness: Secondary | ICD-10-CM

## 2021-12-25 DIAGNOSIS — I25119 Atherosclerotic heart disease of native coronary artery with unspecified angina pectoris: Secondary | ICD-10-CM

## 2021-12-25 DIAGNOSIS — N183 Chronic kidney disease, stage 3 unspecified: Secondary | ICD-10-CM

## 2021-12-25 DIAGNOSIS — I10 Essential (primary) hypertension: Secondary | ICD-10-CM | POA: Diagnosis not present

## 2021-12-25 DIAGNOSIS — K5901 Slow transit constipation: Secondary | ICD-10-CM

## 2021-12-25 DIAGNOSIS — S82892D Other fracture of left lower leg, subsequent encounter for closed fracture with routine healing: Secondary | ICD-10-CM

## 2021-12-25 DIAGNOSIS — I480 Paroxysmal atrial fibrillation: Secondary | ICD-10-CM | POA: Diagnosis not present

## 2021-12-25 DIAGNOSIS — F01B3 Vascular dementia, moderate, with mood disturbance: Secondary | ICD-10-CM | POA: Diagnosis not present

## 2021-12-25 DIAGNOSIS — F418 Other specified anxiety disorders: Secondary | ICD-10-CM

## 2021-12-25 DIAGNOSIS — F5105 Insomnia due to other mental disorder: Secondary | ICD-10-CM

## 2021-12-25 DIAGNOSIS — I129 Hypertensive chronic kidney disease with stage 1 through stage 4 chronic kidney disease, or unspecified chronic kidney disease: Secondary | ICD-10-CM | POA: Diagnosis not present

## 2021-12-25 DIAGNOSIS — K439 Ventral hernia without obstruction or gangrene: Secondary | ICD-10-CM

## 2021-12-25 DIAGNOSIS — R609 Edema, unspecified: Secondary | ICD-10-CM

## 2021-12-25 DIAGNOSIS — R17 Unspecified jaundice: Secondary | ICD-10-CM

## 2021-12-25 NOTE — Assessment & Plan Note (Signed)
stable, on Eliquis, Atorvastatin.  CVA, Neurology: cerebral infarction,MRI chronic right cerebellar stroke, mild to moderate generalized atrophy, small vessel disease with multiple chronic lacunar infarcts, possible left sided stroke not seen on imaging-saw 2 fingers in front of her upon my examination,  numbness in right side of tongue, R sided deficits,  transient vision disturbance of R eye.

## 2021-12-25 NOTE — Progress Notes (Deleted)
Location:  [facility] Room Number: NO/34/A Place of Service:  SNF (31) Provider:  Jurney Overacker X, NP  Patient Care Team: Chyler Creely X, NP as PCP - General (Internal Medicine) Leonie Felita Bump, MD as PCP - Cardiology (Cardiology) Penelope Fittro X, NP as Nurse Practitioner (Internal Medicine) Lavonna Monarch, MD (Inactive) as Consulting Physician (Dermatology)  Extended Emergency Contact Information Primary Emergency Contact: [place] Mobile Phone: [phone] Relation: Daughter Secondary Emergency Contact: Weeks,Tiffany Mobile Phone: [phone] Relation: Daughter Preferred language: English Interpreter needed? No  Code Status:  DNR Goals of care: Advanced Directive information    12/22/2021   11:49 AM  Advanced Directives  Does Patient Have a Medical Advance Directive? Yes  Type of Paramedic of [place];Living will;Out of facility DNR (pink MOST or yellow form)  Does patient want to make changes to medical advance directive? No - Patient declined  Copy of [place] in Chart? Yes - validated most recent copy scanned in chart (See row information)  Pre-existing out of facility DNR order (yellow form or pink MOST form) Pink MOST form placed in chart (order not valid for inpatient use);Yellow form placed in chart (order not valid for inpatient use)     Chief Complaint  Patient presents with   Acute Visit    Patient is being seen for  pain mangement    HPI:  Pt is a 88 y.o. female seen today for an acute visit for    Past Medical History:  Diagnosis Date   Atherosclerosis of native coronary artery 2001   LAD & Cx disease -- BMS PCI in 2001; CABG 2002.  PCI in 2006 (per-op TAH)   Atrial fibrillation and flutter ([place]) 04/2019   New diagnosis-major complaint was chest tightness and dyspnea.   Breast cancer ([place])    Cervix dysplasia    GERD (gastroesophageal reflux disease)    Heart attack ([place]) 2002,  2006   Both events reportedly were perioperatively. She has no recollection. Initial PCI was related to angina symptoms (bilateral arm pain)   High cholesterol    Hypertension    Lumbosacral spinal stenosis    Osteoarthritis    Skin cancer    Sleep apnea    Uses CPAP faithfully   Squamous cell carcinoma of skin 02/12/2020   in situ- left neck-anterior ([id number])   Past Surgical History:  Procedure Laterality Date   ABDOMINAL HYSTERECTOMY     bone density     CARDIAC CATHETERIZATION  2001    Dr[facility]) 2 vessel CAD involving LAD-D1 and distal Cx-OM. Appearance of dilated tapered left main with no significant atherosclerosis on IVUS --  BMS PCI of pLAD ([facility] BMS 3.0 mm x 15 mm) & with PTCA of ostial D1, PTCA of dLAD.  BMS PCI pCx [facility] BMS 3.5 mm x 11 mm) - unable to reopen dCx-OM (probable distal Atheroembolic occlusion.  Normal LVEF.   COLONOSCOPY     CORONARY ARTERY BYPASS GRAFT  2002   Unsure of details [facility] in [facility])   [place]  2001   ([place]) [facility] BMS PCI pLAD (3.0 mm x 15 mm) -PTCA of jailed D1 as well as distal LAD; pCx ([facility] BMS 3.5 mm x 11 mm) -unable to open distalCx-OM -thromboembolic occlusion   DIAGNOSTIC MAMMOGRAM     LEFT HEART CATH AND CORS/GRAFTS ANGIOGRAPHY N/A 04/25/2019   Procedure: LEFT HEART CATH AND CORS/GRAFTS ANGIOGRAPHY;  Surgeon: Troy Sine, MD;  Location: [facility] INVASIVE CV LAB;;; ost-prox LCx stent @ OM1 ostium =100% CTO, Ost-prox LAD stent ~30% ISR, Ost D1 CTO, prox RCA ~30%. SCG-OM2 patent with ostial stent 30%, LIMA-LAD patent; CTO of SVG-D1.    left knee replacement     MASTECTOMY     NM MYOVIEW LTD  12/2014; 2018   Cardiologist (Dr. Kellie Simmering.  [place], [place] ph# [phone]) -->LEXISCAN: Nonischemic, "normal "   pap smear     tonsilectomy     TRANSTHORACIC ECHOCARDIOGRAM  12/2014   Echocardiogram 12/25/14- normal LVEF and systolic function, EF 38-75%, mild grade 1 diastolic dysfunction    TRANSTHORACIC ECHOCARDIOGRAM  04/24/2019   EF 65 to 70%.  Moderate septal LVH.  GR 1 DD-elevated EDP.  Normal PAP.  Relatively normal valves.  No aortic stenosis.    Allergies  Allergen Reactions   Codeine Nausea Only    Outpatient Encounter Medications as of 12/25/2021  Medication Sig   amiodarone (PACERONE) 100 MG tablet Take 100 mg by mouth daily.   Ammonium Lactate (AMLACTIN EX) thin layer topically; topical Once A Day - PRN Special Instructions: May apply topically once daily PRN for dry skin.   apixaban (ELIQUIS) 5 MG TABS tablet Take 1 tablet (5 mg total) by mouth 2 (two) times daily.   atorvastatin (LIPITOR) 80 MG tablet Take 1 tablet (80 mg total) by mouth daily.   Cholecalciferol (VITAMIN D3) 50 MCG (2000 UT) TABS Take 2,000 Units by mouth daily after breakfast.   docusate sodium (COLACE) 100 MG capsule Take 1 capsule (100 mg total) by mouth daily.   DULoxetine (CYMBALTA) 30 MG capsule Take 1 capsule (30 mg total) by mouth daily.   escitalopram (LEXAPRO) 5 MG tablet Take 1 tablet (5 mg total) by mouth daily.   fexofenadine (ALLEGRA) 180 MG tablet Take 1 tablet (180 mg total) by mouth daily as needed for allergies or rhinitis.   fluticasone (FLONASE) 50 MCG/ACT nasal spray Place 1 spray into both nostrils daily as needed for allergies or rhinitis.   HYDROcodone-acetaminophen (NORCO/VICODIN) 5-325 MG tablet Take 1 tablet by mouth every 6 (six) hours.   lidocaine (SALONPAS PAIN RELIEVING) 4 % Place 1 patch onto the skin daily.   LORazepam (ATIVAN) 0.5 MG tablet Take 0.5 mg by mouth every 8 (eight) hours.   losartan (COZAAR) 25 MG tablet Take 25 mg by mouth daily.   melatonin 5 MG TABS Take 5 mg by mouth at bedtime.   metoprolol succinate (TOPROL-XL) 50 MG 24 hr tablet Take 1 tablet (50 mg total) by mouth daily. Take with or immediately following a meal.   nitroGLYCERIN (NITROSTAT) 0.4 MG SL tablet Place 0.4 mg under the tongue as needed for chest pain (Every 5 minutes x 3 doses).    nystatin (MYCOSTATIN/NYSTOP) powder Apply 1 Application topically 2 (two) times daily.   omeprazole (PRILOSEC OTC) 20 MG tablet Take 1 tablet (20 mg total) by mouth daily as needed.   polyethylene glycol (MIRALAX / GLYCOLAX) 17 g packet Take 17 g by mouth daily. Give with 4 oz of water.   senna-docusate (SENOKOT-S) 8.6-50 MG tablet Take 2 tablets by mouth at bedtime.   vitamin B-12 (CYANOCOBALAMIN) 1000 MCG tablet Take 1,000 mcg by mouth daily.   zinc oxide 20 % ointment Apply 1 application topically 3 (three) times daily as needed for irritation. Apply to buttocks after every incontinent episode and as needed for redness.   No facility-administered encounter medications on file as of 12/25/2021.    Review of  Systems  Immunization History  Administered Date(s) Administered   Fluad Quad(high Dose 65+) 10/28/2020   Influenza, High Dose Seasonal PF 10/21/2016, 09/24/2017, 10/25/2019   Influenza-Unspecified 10/03/2011, 10/04/2012, 01/13/2016, 11/05/2021   Moderna SARS-COV2 Booster Vaccination 11/21/2019, 06/11/2020, 05/30/2021   Moderna Sars-Covid-2 Vaccination 01/16/2019, 02/13/2019, 03/13/2019   PNEUMOCOCCAL CONJUGATE-20 07/03/2021   Pneumococcal-Unspecified 01/13/2016   Tetanus 12/01/2017   Zoster Recombinat (Shingrix) 08/24/2018, 02/22/2020   Pertinent  Health Maintenance Due  Topic Date Due   HEMOGLOBIN A1C  12/01/2019   OPHTHALMOLOGY EXAM  05/30/2021   FOOT EXAM  10/17/2021   INFLUENZA VACCINE  Completed   DEXA SCAN  Completed      02/07/2021   12:47 PM 06/19/2021   12:11 AM 09/11/2021    3:39 PM 12/04/2021    4:51 AM 12/17/2021    4:07 PM  Fall Risk  Falls in the past year? 1  0  1  Was there an injury with Fall? 0  0  1  Fall Risk Category Calculator 1  0  3  Fall Risk Category Low  Low  High  Patient Fall Risk Level Moderate fall risk Moderate fall risk Low fall risk High fall risk High fall risk  Patient at Risk for Falls Due to History of fall(s);Impaired  balance/gait;Impaired mobility;Orthopedic patient  No Fall Risks  History of fall(s)  Patient at Risk for Falls Due to - Comments spinal stenosis of lumbar      Fall risk Follow up Falls evaluation completed;Education provided;Falls prevention discussed  Falls evaluation completed  Falls evaluation completed   Functional Status Survey:    Vitals:   12/25/21 1339  BP: (!) 131/59  Pulse: 67  Resp: 17  Temp: 97.7 F (36.5 C)  SpO2: 90%  Weight: 189 lb 9.6 oz (86 kg)  Height: '5\' 8"'$  (1.727 m)   Body mass index is 28.83 kg/m. Physical Exam  Labs reviewed: Recent Labs    06/19/21 0006 06/19/21 0010 08/26/21 0000 10/21/21 0630 12/04/21 0641  NA 140 141 141 140 138  K 4.1 4.0 4.1 4.3 4.2  CL 104 103 103 102 105  CO2 28  --  29* 30* 25  GLUCOSE 108* 106*  --   --  112*  BUN '14 17 17 13 15  '$ CREATININE 0.85 0.80 0.9 0.8 0.84  CALCIUM 9.8  --  10.0 10.0 9.7  MG  --   --   --   --  2.1   Recent Labs    03/26/21 0000 12/04/21 0641  AST 15 20  ALT 12 16  ALKPHOS 47 54  BILITOT  --  0.9  PROT  --  7.4  ALBUMIN 3.8 3.6   Recent Labs    03/26/21 0000 05/01/21 0000 06/19/21 0006 06/19/21 0010 12/04/21 0641  WBC 6.5 6.9 7.2  --  8.7  NEUTROABS 3,627.00  --  3.5  --  5.3  HGB 11.9* 11.7* 13.2 13.9 12.9  HCT 35* 35* 41.8 41.0 39.4  MCV  --   --  105.0*  --  102.1*  PLT 226 221 216  --  249   Lab Results  Component Value Date   TSH 1.390 07/03/2021   Lab Results  Component Value Date   HGBA1C 5.9 (H) 05/31/2019   Lab Results  Component Value Date   CHOL 175 07/03/2021   HDL 59 07/03/2021   LDLCALC 91 07/03/2021   TRIG 146 07/03/2021   CHOLHDL 2.4 04/05/2020    Significant Diagnostic Results in  last 30 days:  CT ABDOMEN PELVIS W CONTRAST  Result Date: 12/04/2021 CLINICAL DATA:  Left lower quadrant abdominal pain. EXAM: CT ABDOMEN AND PELVIS WITH CONTRAST TECHNIQUE: Multidetector CT imaging of the abdomen and pelvis was performed using the standard protocol  following bolus administration of intravenous contrast. RADIATION DOSE REDUCTION: This exam was performed according to the departmental dose-optimization program which includes automated exposure control, adjustment of the mA and/or kV according to patient size and/or use of iterative reconstruction technique. CONTRAST:  130m OMNIPAQUE IOHEXOL 300 MG/ML  SOLN COMPARISON:  None Available. FINDINGS: Lower chest: Patchy airspace opacities are present at the lung bases, left greater than right. No significant effusions are present. Hepatobiliary: Liver is unremarkable. Layering gallstones are present without inflammatory change. The common bile duct is within normal limits for age. Pancreas: Unremarkable. No pancreatic ductal dilatation or surrounding inflammatory changes. Spleen: Normal in size without focal abnormality. Adrenals/Urinary Tract: The adrenal glands are within normal limits bilaterally. An 8.8 cm simple exophytic cyst emanates from the posterior aspect of the left kidney. Kidneys are otherwise unremarkable. No parenchymal lesion or stone is present. No obstruction is present. Ureters are within normal limits. The urinary bladder is normal. Stomach/Bowel: The stomach is within normal limits. Duodenal ulcer noted without inflammatory change. Small bowel is un remarkable proximally. Distal small bowel herniates into the right inguinal canal without obstruction. Terminal ileum is within normal limits. Appendix is visualized and within normal limits. Ascending colon is normal. Moderate stool is present throughout the transverse colon without obstruction. No inflammation is present. Descending colon is normal. Sigmoid colon is somewhat redundant without focal inflammation or obstruction. Vascular/Lymphatic: Atherosclerotic calcifications are present in the aorta and branch vessels. No aneurysm is present. No significant adenopathy is present. Reproductive: Status post hysterectomy. No adnexal masses. Other:  Bilateral paraumbilical hernias contain fat without bowel. Right inguinal hernia contains a loop of small bowel as described above. A left paramedian ventral hernia anterior to the stomach contains fat without bowel. The opening measures 6 mm. No free fluid or free air is present. Musculoskeletal: Grade 1 degenerative anterolisthesis is present at L4-5 and L5-S1. An inferior endplate fracture at L2 is age indeterminate. No other acute fractures are present. Bony pelvis is within normal limits. The hips are located and within normal limits. IMPRESSION: 1. No acute or focal lesion to explain the patient's left lower quadrant abdominal pain. 2. Right inguinal hernia contains a loop of small bowel without obstruction. 3. Bilateral paraumbilical hernias contain fat without bowel. 4. Left paramedian ventral hernia anterior to the stomach contains fat without bowel. 5. Cholelithiasis without evidence of cholecystitis. 6. Patchy airspace opacities at the lung bases, left greater than right. This may represent atelectasis or infection. 7. Age indeterminate inferior endplate fracture at L2. 8. Grade 1 degenerative anterolisthesis at L4-5 and L5-S1. 9. 8.8 cm simple exophytic cyst emanates from the posterior aspect of the left kidney. No follow-up imaging is recommended. JACR 2018 Feb; 264-273, Management of the Incidental Renal Mass on CT, [employer] 2021; 814-848, Bosniak Classification of Cystic Renal Masses, Version 2019. 10.  Aortic Atherosclerosis (ICD10-I70.0). Electronically Signed   By: CSan MorelleM.D.   On: 12/04/2021 08:39   DG Shoulder Right  Result Date: 12/04/2021 CLINICAL DATA:  Persistent shoulder pain after falling several months previously EXAM: RIGHT SHOULDER - 2+ VIEW COMPARISON:  Prior chest x-ray 04/23/2019 FINDINGS: No acute fracture or malalignment. Degenerative osteoarthritis is present at the acromioclavicular joint. Rounded calcification overlies the right chest. This  may be within  the breast, or the lung but is unchanged compared to prior imaging from 7373 and almost certainly benign. Chronic bronchitic changes visualized in the lung. Mild degenerative changes at the rotator cuff insertion. IMPRESSION: 1. Degenerative osteoarthritis at the acromioclavicular joint. 2. Degenerative changes at the rotator cuff insertion. 3. No acute fracture or malalignment. Electronically Signed   By: Jacqulynn Cadet M.D.   On: 12/04/2021 07:14    Assessment/Plan There are no diagnoses linked to this encounter.   Family/ staff Communication: ***  Labs/tests ordered:  ***

## 2021-12-25 NOTE — Assessment & Plan Note (Addendum)
Continue Norco for pain control, f/u Ortho ASAP. 12/21/21 for left ankle fracture, soft brace applied. Chronic lower back pain, left hip, left knee, R+L shoulders

## 2021-12-25 NOTE — Assessment & Plan Note (Signed)
stable, on Senna, MiraLax.

## 2021-12-25 NOTE — Assessment & Plan Note (Signed)
Blood pressure is controlletakes Losartan, Metoprolol. d,

## 2021-12-25 NOTE — Assessment & Plan Note (Signed)
w/o obstruction or gangrene-no further c/o abd pain, surgeon consultation 12/19/21

## 2021-12-25 NOTE — Telephone Encounter (Signed)
Patient with diagnosis of afib on Eliquis for anticoagulation.    Procedure: lumbar interlaminar epidural steroid injections  Date of procedure: TBD  CHA2DS2-VASc Score = 8  This indicates a 10.8% annual risk of stroke. The patient's score is based upon: CHF History: 1 HTN History: 1 Diabetes History: 0 Stroke History: 2 Vascular Disease History: 1 Age Score: 2 Gender Score: 1  Has followed with neuro since 06/2021, brain MRI showed chronic right cerebellar stroke and small vessel disease with multiple chronic lacunar infarcts.  CrCl 80m/min Platelet count 249K  Typically require 3 day Eliquis hold prior to epidural injections. Given pt's elevated CV risk including afib with prior strokes, will forward to MD for input.  **This guidance is not considered finalized until pre-operative APP has relayed final recommendations.**

## 2021-12-25 NOTE — Assessment & Plan Note (Signed)
not apparent,  EF 60% 08/03/21, off Furosemide

## 2021-12-25 NOTE — Assessment & Plan Note (Signed)
stable, takes Omeprazole, Hgb 12.9 12/04/21

## 2021-12-25 NOTE — Telephone Encounter (Signed)
She is 88.  Okay to hold.

## 2021-12-25 NOTE — Assessment & Plan Note (Signed)
MMSE 27/30, SNF for supportive care. Vit B12 324 07/03/21, on Vit B12 1057mg qd.

## 2021-12-25 NOTE — Assessment & Plan Note (Signed)
Lorazepam helps.              Depression, better emotional/teary,  off Sertraline 2/2 confusion. Reduced Lexapro '5mg'$ , added Cymbalta '30mg'$  qd 12/17/21, prn Lorazepam, off Depakote 2/2 to generalized weakness and sleepiness

## 2021-12-25 NOTE — Assessment & Plan Note (Signed)
Multiple factorials, poor oral intake, pain control with Norco scheduled, will update CBC/diff, CMP/eGFR.

## 2021-12-25 NOTE — Progress Notes (Addendum)
Location:   [facility] Room Number: 15 Place of Service:  SNF (31) Provider: Lennie Odor  NP  ,  X, NP  Patient Care Team: ,  X, NP as PCP - General (Internal Medicine) Leonie , MD as PCP - Cardiology (Cardiology) ,  X, NP as Nurse Practitioner (Internal Medicine) Lavonna Monarch, MD (Inactive) as Consulting Physician (Dermatology)  Extended Emergency Contact Information Primary Emergency Contact: Bishop Mobile Phone: [phone] Relation: Daughter Secondary Emergency Contact: Weeks,Tiffany Mobile Phone: [phone] Relation: Daughter Preferred language: English Interpreter needed? No  Code Status:  DNR Goals of care: Advanced Directive information    12/22/2021   11:49 AM  Advanced Directives  Does Patient Have a Medical Advance Directive? Yes  Type of Paramedic of [place];Living will;Out of facility DNR (pink MOST or yellow form)  Does patient want to make changes to medical advance directive? No - Patient declined  Copy of [place] in Chart? Yes - validated most recent copy scanned in chart (See row information)  Pre-existing out of facility DNR order (yellow form or pink MOST form) Pink MOST form placed in chart (order not valid for inpatient use);Yellow form placed in chart (order not valid for inpatient use)     Chief Complaint  Patient presents with   Acute Visit    Pain management, generalized weakness.     HPI:  Pt is a 88 y.o. female seen today for generalized weakness, pain management.   Pain is better controlled with Norco, the patient is noted less oral intake, generalized weakness, progressing of dementia.    ED eval 12/21/21 for left ankle fracture, soft brace applied. The patient was assisted to the floor while getting off toilet, noted the patient's left foot rotated outward. NWB LLE, f/u Ortho ED eval 12/04/21 for pain R shoulder-X-ray showed no acute fx, L  hip, LLQ pain-CT abd identified inguinal hernia  w/o obstruction or gangrene-no further c/o abd pain. OA pain is managed with Norco. HPOA daughter desires comfort measures presently.              R inguinal hernia  w/o obstruction or gangrene-no further c/o abd pain, surgeon consultation 12/19/21             TIA, stable, on Eliquis, Atorvastatin.  CVA, Neurology: cerebral infarction,MRI chronic right cerebellar stroke, mild to moderate generalized atrophy, small vessel disease with multiple chronic lacunar infarcts, possible left sided stroke not seen on imaging-saw 2 fingers in front of her upon my examination,  numbness in right side of tongue, R sided deficits,  transient vision disturbance of R eye.  OA, left knee s/p TKR, MR lumbar, severe spinal 11/13/20 spinal and foraminal stenosis with left sciatica, Pain in the right shoulder,  in the left shoulder, hip, knee, takes Norco 10/351m q6hr, q6hr prn, better controlled             Constipation, stable, on Senna, MiraLax.              Afib, takes Metoprolol, Eliquis, Amiodarone, f/u cardiology. TSH 1.39 07/03/21             HTN, takes Losartan, Metoprolol.              GERD, stable, takes Omeprazole, Hgb 12.9 12/04/21             CAD, stable, Hx of CABG Cath 04/25/19, prn NTG, takes Atorvastatin. LDL 74 04/05/21  CKD Bun/creat 15/0.84 12/04/21             Insomnia/OSA, not using CPAP,  Lorazepam helps.              Depression, better emotional/teary,  off Sertraline 2/2 confusion. Reduced Lexapro 38m, added Cymbalta 323mqd 12/17/21, prn Lorazepam, off Depakote 2/2 to generalized weakness and sleepiness             Vascular cognitive impairment, MMSE 27/30, SNF for supportive care. Vit B12 324 07/03/21, on Vit B12 100027mqd.              BLE edema, not apparent,  EF 60% 08/03/21, off Furosemide   Past Medical History:  Diagnosis Date   Atherosclerosis of native coronary artery 2001   LAD & Cx disease -- BMS PCI in 2001; CABG 2002.  PCI in  2006 (per-op [facility])   Atrial fibrillation and flutter ([place]   New diagnosis-major complaint was chest tightness and dyspnea.   Breast cancer ([facility]  Cervix dysplasia    GERD (gastroesophageal reflux disease)    Heart attack [place], 2006   Both events reportedly were perioperatively. She has no recollection. Initial PCI was related to angina symptoms (bilateral arm pain)   High cholesterol    Hypertension    Lumbosacral spinal stenosis    Osteoarthritis    Skin cancer    Sleep apnea    Uses CPAP faithfully   Squamous cell carcinoma of skin 02/12/2020   in situ- left neck-anterior (CX35FU)   Past Surgical History:  Procedure Laterality Date   ABDOMINAL HYSTERECTOMY     bone density     CARDIAC [facility]  [facility] LAD-D1 and distal Cx-OM. Appearance of dilated tapered left main with no significant atherosclerosis on IVUS --  BMS PCI of pLAD ([facility] BMS 3.0 mm x 15 mm) & with PTCA of ostial D1, PTCA of dLAD.  [facility]Cx [facility] 11 [facility]) - unable to reopen dCx-OM (probable distal Atheroembolic occlusion.  Normal LVEF.   COLONOSCOPY     CORONARY ARTERY BYPASS GRAFT  2002   Unsure of details [facility] [id number]   ([place] BMS PCI pLAD (3.0 mm x 15 mm) -PTCA of jailed D1 as well as distal LAD; pCx ([facility] BMS 3.5 mm x 11 mm) -unable to open distalCx-OM -thromboembolic occlusion   DIAGNOSTIC MAMMOGRAM     LEFT HEART CATH AND CORS/GRAFTS ANGIOGRAPHY N/A 04/25/2019   Procedure: LEFT HEART CATH AND CORS/GRAFTS ANGIOGRAPHY;  Surgeon: KelTroy SineD;  Location: [facility] IN[facility]IVE [facility];;; ost-prox LCx stent @ OM1 ostium =100% CTO, Ost-prox LAD stent ~30% ISR, Ost D1 CTO, prox RCA ~30%. SCG-OM2 patent with ostial stent 30%, LIMA-LAD patent; CTO of SVG-D1.    left knee replacement     MASTECTOMY     NM MYOVIEW LTD  12/2014; 2018   Cardiologist (Dr. GavKellie SimmeringLoris, [place]# [phone]->LEXISCAN: Nonischemic, "normal "   pap smear     tonsilectomy     TRANSTHORACIC ECHOCARDIOGRAM  12/2014   Echocardiogram 12/25/14- normal LVEF and systolic function, EF 50-05-69%ild grade 1 diastolic dysfunction   TRANSTHORACIC ECHOCARDIOGRAM  04/24/2019   EF 65 to 70%.  Moderate septal LVH.  GR 1 DD-elevated EDP.  Normal PAP.  Relatively normal valves.  No aortic stenosis.  Allergies  Allergen Reactions   Codeine Nausea Only    Allergies as of 12/25/2021       Reactions   Codeine Nausea Only        Medication List        Accurate as of December 25, 2021 11:59 PM. If you have any questions, ask your nurse or doctor.          STOP taking these medications    divalproex 125 MG DR tablet Commonly known as: DEPAKOTE Stopped by:  X , NP   furosemide 20 MG tablet Commonly known as: LASIX Stopped by:  X , NP   potassium chloride 10 MEQ tablet Commonly known as: KLOR-CON M Stopped by:  X , NP       TAKE these medications    amiodarone 100 MG tablet Commonly known as: PACERONE Take 100 mg by mouth daily.   AMLACTIN EX thin layer topically; topical Once A Day - PRN Special Instructions: May apply topically once daily PRN for dry skin.   apixaban 5 MG Tabs tablet Commonly known as: Eliquis Take 1 tablet (5 mg total) by mouth 2 (two) times daily.   atorvastatin 80 MG tablet Commonly known as: LIPITOR Take 1 tablet (80 mg total) by mouth daily.   cyanocobalamin 1000 MCG tablet Commonly known as: VITAMIN B12 Take 1,000 mcg by mouth daily.   docusate sodium 100 MG capsule Commonly known as: COLACE Take 1 capsule (100 mg total) by mouth daily.   DULoxetine 30 MG capsule Commonly known as: Cymbalta Take 1 capsule (30 mg total) by mouth daily.   escitalopram 5 MG tablet Commonly known as: Lexapro Take 1 tablet (5 mg total) by mouth daily.   fexofenadine 180 MG tablet Commonly known as: ALLEGRA Take 1 tablet  (180 mg total) by mouth daily as needed for allergies or rhinitis.   fluticasone 50 MCG/ACT nasal spray Commonly known as: FLONASE Place 1 spray into both nostrils daily as needed for allergies or rhinitis.   HYDROcodone-acetaminophen 5-325 MG tablet Commonly known as: NORCO/VICODIN Take 1 tablet by mouth every 6 (six) hours.   LORazepam 0.5 MG tablet Commonly known as: ATIVAN Take 0.5 mg by mouth every 8 (eight) hours.   losartan 25 MG tablet Commonly known as: COZAAR Take 25 mg by mouth daily.   melatonin 5 MG Tabs Take 5 mg by mouth at bedtime.   metoprolol succinate 50 MG 24 hr tablet Commonly known as: TOPROL-XL Take 1 tablet (50 mg total) by mouth daily. Take with or immediately following a meal.   nitroGLYCERIN 0.4 MG SL tablet Commonly known as: NITROSTAT Place 0.4 mg under the tongue as needed for chest pain (Every 5 minutes x 3 doses).   nystatin powder Commonly known as: MYCOSTATIN/NYSTOP Apply 1 Application topically 2 (two) times daily.   omeprazole 20 MG tablet Commonly known as: PRILOSEC OTC Take 1 tablet (20 mg total) by mouth daily as needed.   polyethylene glycol 17 g packet Commonly known as: MIRALAX / GLYCOLAX Take 17 g by mouth daily. Give with 4 oz of water.   Salonpas Pain Relieving 4 % Generic drug: lidocaine Place 1 patch onto the skin daily.   senna-docusate 8.6-50 MG tablet Commonly known as: Senokot-S Take 2 tablets by mouth at bedtime.   Vitamin D3 50 MCG (2000 UT) Tabs Take 2,000 Units by mouth daily after breakfast.   zinc oxide 20 % ointment Apply 1 application topically 3 (three) times daily as needed for  irritation. Apply to buttocks after every incontinent episode and as needed for redness.        Review of Systems  Constitutional:  Positive for activity change, appetite change and fatigue. Negative for fever.  HENT:  Negative for congestion, hearing loss and voice change.   Eyes:  Negative for visual disturbance.        C/o eye sight issue upon first awake in am, mostly R eye, better as day goes on.   Respiratory:  Negative for cough, chest tightness, shortness of breath and wheezing.   Cardiovascular:  Negative for leg swelling.  Gastrointestinal:  Negative for abdominal pain, blood in stool, constipation, nausea and vomiting.  Genitourinary:  Negative for dysuria and urgency.  Musculoskeletal:  Positive for arthralgias, back pain and gait problem.       S/p L TKR,  left hip, left knee, R+L shoulder pain.   Skin:  Positive for color change.       Left lower leg bruise, mild warmth and redness, tenderness, dorsalis pedis pulse present.   Neurological:  Positive for numbness. Negative for speech difficulty and headaches.       Memory lapses. C/o numbness right side of tongue.   Hematological:  Does not bruise/bleed easily.  Psychiatric/Behavioral:  Positive for sleep disturbance. Negative for behavioral problems and dysphoric mood. The patient is nervous/anxious.        Better    Immunization History  Administered Date(s) Administered   Fluad Quad(high Dose 65+) 10/28/2020   Influenza, High Dose Seasonal PF 10/21/2016, 09/24/2017, 10/25/2019   Influenza-Unspecified 10/03/2011, 10/04/2012, 01/13/2016, 11/05/2021   Moderna SARS-COV2 Booster Vaccination 11/21/2019, 06/11/2020, 05/30/2021   Moderna Sars-Covid-2 Vaccination 01/16/2019, 02/13/2019, 03/13/2019   PNEUMOCOCCAL CONJUGATE-20 07/03/2021   Pneumococcal-Unspecified 01/13/2016   Tetanus 12/01/2017   Zoster Recombinat (Shingrix) 08/24/2018, 02/22/2020   Pertinent  Health Maintenance Due  Topic Date Due   HEMOGLOBIN A1C  12/01/2019   OPHTHALMOLOGY EXAM  05/30/2021   FOOT EXAM  10/17/2021   INFLUENZA VACCINE  Completed   DEXA SCAN  Completed      02/07/2021   12:47 PM 06/19/2021   12:11 AM 09/11/2021    3:39 PM 12/04/2021    4:51 AM 12/17/2021    4:07 PM  Fall Risk  Falls in the past year? 1  0  1  Was there an injury with Fall? 0  0  1  Fall  Risk Category Calculator 1  0  3  Fall Risk Category Low  Low  High  Patient Fall Risk Level Moderate fall risk Moderate fall risk Low fall risk High fall risk High fall risk  Patient at Risk for Falls Due to History of fall(s);Impaired balance/gait;Impaired mobility;Orthopedic patient  No Fall Risks  History of fall(s)  Patient at Risk for Falls Due to - Comments spinal stenosis of lumbar      Fall risk Follow up Falls evaluation completed;Education provided;Falls prevention discussed  Falls evaluation completed  Falls evaluation completed   Functional Status Survey:    Vitals:   12/25/21 1243  BP: (!) 131/59  Pulse: 67  Resp: 17  Temp: 97.7 F (36.5 C)  SpO2: 90%  Weight: 190 lb (86.2 kg)   Body mass index is 28.89 kg/m. Physical Exam Constitutional:      Appearance: Normal appearance.  HENT:     Head: Normocephalic and atraumatic.     Nose: Nose normal.     Mouth/Throat:     Mouth: Mucous membranes are moist.  Eyes:  Extraocular Movements: Extraocular movements intact.     Pupils: Pupils are equal, round, and reactive to light.  Cardiovascular:     Rate and Rhythm: Normal rate and regular rhythm.     Heart sounds: Murmur heard.  Pulmonary:     Effort: Pulmonary effort is normal.     Breath sounds: Rales present.     Comments: Bibasilar rales present Abdominal:     General: Bowel sounds are normal.     Palpations: Abdomen is soft.     Tenderness: There is no abdominal tenderness. There is no right CVA tenderness, left CVA tenderness, guarding or rebound.     Hernia: A hernia is present.     Comments: R inguinal hernia per CT abd.   Musculoskeletal:     Cervical back: Normal range of motion and neck supple.     Right lower leg: No edema.     Left lower leg: No edema.     Comments: Trace edema left ankle.  Slightly R arm weakness. persisted right shoulder/arm aches. No decreased ROM. Chronic pain in the left hip, knee, and aches in the left shoulder-no redness,  swelling, deformity, or decreased ROM-except preexisting limited over head ROM. Pain in the left lower leg and ankle noted.   Skin:    General: Skin is warm and dry.     Findings: Bruising and erythema present.     Comments: S/p right mastectomy.  Left lower leg bruise, mild warmth and redness, tenderness, increases swelling, dorsalis pedis pulse present.  RLE mild erythematous and swelling, slightly better after Furosemide resumed.   Neurological:     General: No focal deficit present.     Mental Status: She is alert and oriented to person, place, and time. Mental status is at baseline.     Motor: Weakness present.     Coordination: Coordination normal.     Gait: Gait abnormal.     Comments: C/o numbness of the right side of tongue. Saw 2 fingers in front of her upon my examination left eye.   Psychiatric:        Mood and Affect: Mood normal.        Behavior: Behavior normal.        Thought Content: Thought content normal.     Labs reviewed: Recent Labs    06/19/21 0006 06/19/21 0010 08/26/21 0000 10/21/21 0630 12/04/21 0641  NA 140 141 141 140 138  K 4.1 4.0 4.1 4.3 4.2  CL 104 103 103 102 105  CO2 28  --  29* 30* 25  GLUCOSE 108* 106*  --   --  112*  BUN _0 CREATININE 0.85 0.80 0.9 0.8 0.84  CALCIUM 9.8  --  10.0 10.0 9.7  MG  --   --   --   --  2.1   Recent Labs    03/26/21 0000 12/04/21 0641  AST 15 20  ALT 12 16  ALKPHOS 47 54  BILITOT  --  0.9  PROT  --  7.4  ALBUMIN 3.8 3.6   Recent Labs    03/26/21 0000 05/01/21 0000 06/19/21 0006 06/19/21 0010 12/04/21 0641  WBC 6.5 6.9 7.2  --  8.7  NEUTROABS 3,627.00  --  3.5  --  5.3  HGB 11.9* 11.7* 13.2 13.9 12.9  HCT 35* 35* 41.8 41.0 39.4  MCV  --   --  105.0*  --  102.1*  PLT 226 221 216  --  249  Lab Results  Component Value Date   TSH 1.390 07/03/2021   Lab Results  Component Value Date   HGBA1C 5.9 (H) 05/31/2019   Lab Results  Component Value Date   CHOL 175 07/03/2021    HDL 59 07/03/2021   LDLCALC 91 07/03/2021   TRIG 146 07/03/2021   CHOLHDL 2.4 04/05/2020    Significant Diagnostic Results in last 30 days:  CT ABDOMEN PELVIS W CONTRAST  Result Date: 12/04/2021 CLINICAL DATA:  Left lower quadrant abdominal pain. EXAM: CT ABDOMEN AND PELVIS WITH CONTRAST TECHNIQUE: Multidetector CT imaging of the abdomen and pelvis was performed using the standard protocol following bolus administration of intravenous contrast. RADIATION DOSE REDUCTION: This exam was performed according to the departmental dose-optimization program which includes automated exposure control, adjustment of the mA and/or kV according to patient size and/or use of iterative reconstruction technique. CONTRAST:  19m OMNIPAQUE IOHEXOL 300 MG/ML  SOLN COMPARISON:  None Available. FINDINGS: Lower chest: Patchy airspace opacities are present at the lung bases, left greater than right. No significant effusions are present. Hepatobiliary: Liver is unremarkable. Layering gallstones are present without inflammatory change. The common bile duct is within normal limits for age. Pancreas: Unremarkable. No pancreatic ductal dilatation or surrounding inflammatory changes. Spleen: Normal in size without focal abnormality. Adrenals/Urinary Tract: The adrenal glands are within normal limits bilaterally. An 8.8 cm simple exophytic cyst emanates from the posterior aspect of the left kidney. Kidneys are otherwise unremarkable. No parenchymal lesion or stone is present. No obstruction is present. Ureters are within normal limits. The urinary bladder is normal. Stomach/Bowel: The stomach is within normal limits. Duodenal ulcer noted without inflammatory change. Small bowel is un remarkable proximally. Distal small bowel herniates into the right inguinal canal without obstruction. Terminal ileum is within normal limits. Appendix is visualized and within normal limits. Ascending colon is normal. Moderate stool is present throughout  the transverse colon without obstruction. No inflammation is present. Descending colon is normal. Sigmoid colon is somewhat redundant without focal inflammation or obstruction. Vascular/Lymphatic: Atherosclerotic calcifications are present in the aorta and branch vessels. No aneurysm is present. No significant adenopathy is present. Reproductive: Status post hysterectomy. No adnexal masses. Other: Bilateral paraumbilical hernias contain fat without bowel. Right inguinal hernia contains a loop of small bowel as described above. A left paramedian ventral hernia anterior to the stomach contains fat without bowel. The opening measures 6 mm. No free fluid or free air is present. Musculoskeletal: Grade 1 degenerative anterolisthesis is present at L4-5 and L5-S1. An inferior endplate fracture at L2 is age indeterminate. No other acute fractures are present. Bony pelvis is within normal limits. The hips are located and within normal limits. IMPRESSION: 1. No acute or focal lesion to explain the patient's left lower quadrant abdominal pain. 2. Right inguinal hernia contains a loop of small bowel without obstruction. 3. Bilateral paraumbilical hernias contain fat without bowel. 4. Left paramedian ventral hernia anterior to the stomach contains fat without bowel. 5. Cholelithiasis without evidence of cholecystitis. 6. Patchy airspace opacities at the lung bases, left greater than right. This may represent atelectasis or infection. 7. Age indeterminate inferior endplate fracture at L2. 8. Grade 1 degenerative anterolisthesis at L4-5 and L5-S1. 9. 8.8 cm simple exophytic cyst emanates from the posterior aspect of the left kidney. No follow-up imaging is recommended. JACR 2018 Feb; 264-273, Management of the Incidental Renal Mass on CT, [employer] 2021; 814-848, Bosniak Classification of Cystic Renal Masses, Version 2019. 10.  Aortic Atherosclerosis (ICD10-I70.0). Electronically Signed  By: San Morelle M.D.   On:  12/04/2021 08:39   DG Shoulder Right  Result Date: 12/04/2021 CLINICAL DATA:  Persistent shoulder pain after falling several months previously EXAM: RIGHT SHOULDER - 2+ VIEW COMPARISON:  Prior chest x-ray 04/23/2019 FINDINGS: No acute fracture or malalignment. Degenerative osteoarthritis is present at the acromioclavicular joint. Rounded calcification overlies the right chest. This may be within the breast, or the lung but is unchanged compared to prior imaging from 4174 and almost certainly benign. Chronic bronchitic changes visualized in the lung. Mild degenerative changes at the rotator cuff insertion. IMPRESSION: 1. Degenerative osteoarthritis at the acromioclavicular joint. 2. Degenerative changes at the rotator cuff insertion. 3. No acute fracture or malalignment. Electronically Signed   By: Jacqulynn Cadet M.D.   On: 12/04/2021 07:14    Assessment/Plan  Generalized weakness Multiple factorials, poor oral intake, pain control with Norco scheduled, will update CBC/diff, CMP/eGFR.   Closed left ankle fracture Continue Norco for pain control, f/u Ortho ASAP. 12/21/21 for left ankle fracture, soft brace applied. Chronic lower back pain, left hip, left knee, R+L shoulders  Abdominal wall hernia w/o obstruction or gangrene-no further c/o abd pain, surgeon consultation 12/19/21  CVA (cerebral vascular accident) ([place]) stable, on Eliquis, Atorvastatin.  CVA, Neurology: cerebral infarction,MRI chronic right cerebellar stroke, mild to moderate generalized atrophy, small vessel disease with multiple chronic lacunar infarcts, possible left sided stroke not seen on imaging-saw 2 fingers in front of her upon my examination,  numbness in right side of tongue, R sided deficits,  transient vision disturbance of R eye.   Constipation stable, on Senna, MiraLax.   Paroxysmal atrial fibrillation/flutter (HCC) takes Metoprolol, Eliquis, Amiodarone, f/u cardiology. TSH 1.39 07/03/21  Essential  hypertension Blood pressure is controlletakes Losartan, Metoprolol. d,   Gastroesophageal reflux disease stable, takes Omeprazole, Hgb 12.9 12/04/21  Coronary artery disease involving native coronary artery of native heart with angina pectoris (HCC) stable, Hx of CABG Cath 04/25/19, prn NTG, takes Atorvastatin. LDL 74 04/05/21  CKD (chronic kidney disease) stage 3, GFR 30-59 ml/min (HCC) Bun/creat 15/0.84 12/04/21  Insomnia secondary to depression with anxiety Lorazepam helps.              Depression, better emotional/teary,  off Sertraline 2/2 confusion. Reduced Lexapro 51m, added Cymbalta 319mqd 12/17/21, prn Lorazepam, off Depakote 2/2 to generalized weakness and sleepiness  Vascular dementia (HCC) MMSE 27/30, SNF for supportive care. Vit B12 324 07/03/21, on Vit B12 100019mqd.   Edema not apparent,  EF 60% 08/03/21, off Furosemide  Elevated bilirubin 12/23/21 total bilirubin 1.5, 12/25/21 total bilirubin 1.1(0.2-1.1)   Family/ staff Communication: plan of care reviewed with the patient, the patient's HOPA daughter, socEducation officer, museumnd chaCamera operator Labs/tests ordered:  CBC/diff, CMP/eGFR   Time spend 35 minutes.

## 2021-12-25 NOTE — Assessment & Plan Note (Signed)
Bun/creat 15/0.84 12/04/21

## 2021-12-25 NOTE — Assessment & Plan Note (Signed)
stable, Hx of CABG Cath 04/25/19, prn NTG, takes Atorvastatin. LDL 74 04/05/21

## 2021-12-25 NOTE — Assessment & Plan Note (Signed)
takes Metoprolol, Eliquis, Amiodarone, f/u cardiology. TSH 1.39 07/03/21

## 2021-12-26 DIAGNOSIS — N189 Chronic kidney disease, unspecified: Secondary | ICD-10-CM | POA: Diagnosis not present

## 2021-12-26 DIAGNOSIS — R17 Unspecified jaundice: Secondary | ICD-10-CM | POA: Insufficient documentation

## 2021-12-26 NOTE — Assessment & Plan Note (Signed)
12/23/21 total bilirubin 1.5, 12/25/21 total bilirubin 1.1(0.2-1.1)

## 2021-12-26 NOTE — Progress Notes (Deleted)
This encounter was created in error - please disregard.

## 2021-12-26 NOTE — Progress Notes (Signed)
This encounter was created in error - please disregard.

## 2021-12-29 DIAGNOSIS — S8262XA Displaced fracture of lateral malleolus of left fibula, initial encounter for closed fracture: Secondary | ICD-10-CM | POA: Diagnosis not present

## 2021-12-30 DIAGNOSIS — R748 Abnormal levels of other serum enzymes: Secondary | ICD-10-CM | POA: Diagnosis not present

## 2021-12-30 DIAGNOSIS — K8020 Calculus of gallbladder without cholecystitis without obstruction: Secondary | ICD-10-CM | POA: Diagnosis not present

## 2021-12-31 ENCOUNTER — Ambulatory Visit: Payer: Medicare Other | Attending: Nurse Practitioner | Admitting: Nurse Practitioner

## 2021-12-31 ENCOUNTER — Encounter: Payer: Self-pay | Admitting: Nurse Practitioner

## 2021-12-31 DIAGNOSIS — K8020 Calculus of gallbladder without cholecystitis without obstruction: Secondary | ICD-10-CM | POA: Insufficient documentation

## 2021-12-31 NOTE — Progress Notes (Deleted)
Office Visit    Patient Name: Stephanie Shea Date of Encounter: 12/31/2021  Primary Care Provider:  Mast, [name], NP Primary Cardiologist:  Glenetta Hew, MD  Chief Complaint    88 year old female with a history of CAD s/p BMS-LAD in 2001, CABG in 2002, and PCI in 2006, paroxysmal atrial fibrillation/flutter, chronic diastolic heart failure, hypertension, CKD, and OSA who presents for follow-up related to CAD and atrial fibrillation and for preoperative cardiac evaluation.  Past Medical History    Past Medical History:  Diagnosis Date   Atherosclerosis of native coronary artery 2001   LAD & Cx disease -- BMS PCI in 2001; CABG 2002.  PCI in 2006 (per-op [facility])   Atrial fibrillation and flutter ([place]) 04/2019   New diagnosis-major complaint was chest tightness and dyspnea.   Breast cancer ([place])    Cervix dysplasia    GERD (gastroesophageal reflux disease)    Heart attack ([place]) 2002, 2006   Both events reportedly were perioperatively. She has no recollection. Initial PCI was related to angina symptoms (bilateral arm pain)   High cholesterol    Hypertension    Lumbosacral spinal stenosis    Osteoarthritis    Skin cancer    Sleep apnea    Uses CPAP faithfully   Squamous cell carcinoma of skin 02/12/2020   in situ- left neck-anterior (CX35FU)   Past Surgical History:  Procedure Laterality Date   ABDOMINAL HYSTERECTOMY     bone density     CARDIAC CATHETERIZATION  2001    Dr. Glade [facility]) 2 vessel CAD involving LAD-D1 and distal Cx-OM. Appearance of dilated tapered left main with no significant atherosclerosis on IVUS --  BMS PCI of pLAD ([facility] BMS 3.0 mm x 15 mm) & with PTCA of ostial D1, PTCA of dLAD.  BMS PCI pCx [facility] BMS 3.5 mm x 11 mm) - unable to reopen dCx-OM (probable distal Atheroembolic occlusion.  Normal LVEF.   COLONOSCOPY     CORONARY ARTERY BYPASS GRAFT  2002   Unsure of details [facility], in [facility])   [place]  2001   ([place])  [facility] BMS PCI pLAD (3.0 mm x 15 mm) -PTCA of jailed D1 as well as distal LAD; pCx ([facility] BMS 3.5 mm x 11 mm) -unable to open distalCx-OM -thromboembolic occlusion   DIAGNOSTIC MAMMOGRAM     LEFT HEART CATH AND CORS/GRAFTS ANGIOGRAPHY N/A 04/25/2019   Procedure: LEFT HEART CATH AND CORS/GRAFTS ANGIOGRAPHY;  Surgeon: Troy Sine, MD;  Location: [facility] IN[facility] LAB;;; ost-prox LCx stent @ OM1 ostium =100% CTO, Ost-prox LAD stent ~30% ISR, Ost D1 CTO, prox RCA ~30%. SCG-OM2 patent with ostial stent 30%, LIMA-LAD patent; CTO of SVG-D1.    left knee replacement     MASTECTOMY     NM MYOVIEW LTD  12/2014; 2018   Cardiologist (Dr. Kellie Simmering.  [place], [place] ph# [phone]) -->LEXISCAN: Nonischemic, "normal "   pap smear     tonsilectomy     TRANSTHORACIC ECHOCARDIOGRAM  12/2014   Echocardiogram 12/25/14- normal LVEF and systolic function, EF 21-97%, mild grade 1 diastolic dysfunction   TRANSTHORACIC ECHOCARDIOGRAM  04/24/2019   EF 65 to 70%.  Moderate septal LVH.  GR 1 DD-elevated EDP.  Normal PAP.  Relatively normal valves.  No aortic stenosis.    Allergies  Allergies  Allergen Reactions   Codeine Nausea Only    History of Present Illness    88 year old female with the above past medical history including CAD s/p  BMS-LAD in 2001, CABG in 2002, and PCI in 2006, paroxysmal atrial fibrillation/flutter, chronic diastolic heart failure, hypertension, CKD, and OSA.  She has a history of extensive CAD with both native and graft disease.  Most recent cardiac catheterization in 2021 showed native left circumflex and diagonal 1 occluded along with OM1, graft to diagonal 1 occluded as well, patent graft to OM2 and LIMA-LAD.  She has a history of paroxysmal atrial fibrillation/flutter rhythm controlled on amiodarone, also on Eliquis and metoprolol.  Most recent echocardiogram in 07/2021 showed EF 60 to 65%, normal LV function, no RWMA, moderate asymmetric LVH of the basal-septal segment, indeterminate  diastolic parameters, RV not well-visualized, no significant valvular abnormalities.  Carotid Dopplers in 07/2021 showed 1 to 39% B ICA stenosis.  She was last seen in the office on 08/21/2020 and was stable from a cardiac standpoint.  She denied symptoms concerning for angina. She was maintaining NSR at the time.  She presents today for follow-up and for preoperative cardiac evaluation for lumbar interlaminar epidural steroid injections with Dr. Jacelyn Grip of [facility] orthopedics with request to hold Eliquis for 3 days prior to procedure.  Since her last visit   CAD: Paroxysmal atrial fibrillation: Chronic diastolic heart failure: Hypertension: CKD: OSA: Preoperative cardiac evaluation: Per Dr. Ellyn Hack, primary cardiologist, okay to hold Eliquis for 3 days prior to injection. Disposition:      Home Medications    Current Outpatient Medications  Medication Sig Dispense Refill   amiodarone (PACERONE) 100 MG tablet Take 100 mg by mouth daily.     Ammonium Lactate (AMLACTIN EX) thin layer topically; topical Once A Day - PRN Special Instructions: May apply topically once daily PRN for dry skin.     apixaban (ELIQUIS) 5 MG TABS tablet Take 1 tablet (5 mg total) by mouth 2 (two) times daily. 30 tablet 0   atorvastatin (LIPITOR) 80 MG tablet Take 1 tablet (80 mg total) by mouth daily. 30 tablet 0   Cholecalciferol (VITAMIN D3) 50 MCG (2000 UT) TABS Take 2,000 Units by mouth daily after breakfast. 30 tablet 0   docusate sodium (COLACE) 100 MG capsule Take 1 capsule (100 mg total) by mouth daily. 30 capsule 0   DULoxetine (CYMBALTA) 30 MG capsule Take 1 capsule (30 mg total) by mouth daily. 30 capsule 3   escitalopram (LEXAPRO) 5 MG tablet Take 1 tablet (5 mg total) by mouth daily. 30 tablet 3   fexofenadine (ALLEGRA) 180 MG tablet Take 1 tablet (180 mg total) by mouth daily as needed for allergies or rhinitis. 30 tablet 0   fluticasone (FLONASE) 50 MCG/ACT nasal spray Place 1 spray into both nostrils  daily as needed for allergies or rhinitis. 30 mL 0   HYDROcodone-acetaminophen (NORCO/VICODIN) 5-325 MG tablet Take 1 tablet by mouth every 6 (six) hours. 120 tablet 0   lidocaine (SALONPAS PAIN RELIEVING) 4 % Place 1 patch onto the skin daily.     LORazepam (ATIVAN) 0.5 MG tablet Take 0.5 mg by mouth every 8 (eight) hours.     losartan (COZAAR) 25 MG tablet Take 25 mg by mouth daily.     melatonin 5 MG TABS Take 5 mg by mouth at bedtime.     metoprolol succinate (TOPROL-XL) 50 MG 24 hr tablet Take 1 tablet (50 mg total) by mouth daily. Take with or immediately following a meal. 30 tablet 0   nitroGLYCERIN (NITROSTAT) 0.4 MG SL tablet Place 0.4 mg under the tongue as needed for chest pain (Every 5 minutes x  3 doses).     nystatin (MYCOSTATIN/NYSTOP) powder Apply 1 Application topically 2 (two) times daily.     omeprazole (PRILOSEC OTC) 20 MG tablet Take 1 tablet (20 mg total) by mouth daily as needed. 30 tablet 0   polyethylene glycol (MIRALAX / GLYCOLAX) 17 g packet Take 17 g by mouth daily. Give with 4 oz of water. 30 each 0   senna-docusate (SENOKOT-S) 8.6-50 MG tablet Take 2 tablets by mouth at bedtime.     vitamin B-12 (CYANOCOBALAMIN) 1000 MCG tablet Take 1,000 mcg by mouth daily.     zinc oxide 20 % ointment Apply 1 application topically 3 (three) times daily as needed for irritation. Apply to buttocks after every incontinent episode and as needed for redness. 30 g 0   No current facility-administered medications for this visit.     Review of Systems    ***.  All other systems reviewed and are otherwise negative except as noted above.    Physical Exam    VS:  There were no vitals taken for this visit. , BMI There is no height or weight on file to calculate BMI.     GEN: Well nourished, well developed, in no acute distress. HEENT: normal. Neck: Supple, no JVD, carotid bruits, or masses. Cardiac: RRR, no murmurs, rubs, or gallops. No clubbing, cyanosis, edema.  Radials/DP/PT 2+ and  equal bilaterally.  Respiratory:  Respirations regular and unlabored, clear to auscultation bilaterally. GI: Soft, nontender, nondistended, BS + x 4. MS: no deformity or atrophy. Skin: warm and dry, no rash. Neuro:  Strength and sensation are intact. Psych: Normal affect.  Accessory Clinical Findings    ECG personally reviewed by me today - *** - no acute changes.   Lab Results  Component Value Date   WBC 8.7 12/04/2021   HGB 12.9 12/04/2021   HCT 39.4 12/04/2021   MCV 102.1 (H) 12/04/2021   PLT 249 12/04/2021   Lab Results  Component Value Date   CREATININE 0.84 12/04/2021   BUN 15 12/04/2021   NA 138 12/04/2021   K 4.2 12/04/2021   CL 105 12/04/2021   CO2 25 12/04/2021   Lab Results  Component Value Date   ALT 16 12/04/2021   AST 20 12/04/2021   ALKPHOS 54 12/04/2021   BILITOT 0.9 12/04/2021   Lab Results  Component Value Date   CHOL 175 07/03/2021   HDL 59 07/03/2021   LDLCALC 91 07/03/2021   TRIG 146 07/03/2021   CHOLHDL 2.4 04/05/2020    Lab Results  Component Value Date   HGBA1C 5.9 (H) 05/31/2019    Assessment & Plan    1.  ***  No BP recorded.  {Refresh Note OR Click here to enter BP  :1}***   Lenna Sciara, NP 12/31/2021, 6:07 AM

## 2022-01-01 NOTE — Telephone Encounter (Signed)
Returned [employer] back from [facility].  She has been made aware that pt missed her preop appointment, yesterday, 12/31/21.  Per Katharine Look, she will contact pt to see if she plans to pursue with the procedure, if so, she will have pt call back to reschedule appointment.

## 2022-01-01 NOTE — Telephone Encounter (Signed)
Katharine Look from [facility] calling to follow up on clearance. Phone: [phone]

## 2022-01-06 ENCOUNTER — Non-Acute Institutional Stay (SKILLED_NURSING_FACILITY): Payer: Medicare Other | Admitting: Family Medicine

## 2022-01-06 DIAGNOSIS — K5901 Slow transit constipation: Secondary | ICD-10-CM

## 2022-01-06 DIAGNOSIS — I480 Paroxysmal atrial fibrillation: Secondary | ICD-10-CM

## 2022-01-06 DIAGNOSIS — R609 Edema, unspecified: Secondary | ICD-10-CM

## 2022-01-06 DIAGNOSIS — I5032 Chronic diastolic (congestive) heart failure: Secondary | ICD-10-CM

## 2022-01-06 DIAGNOSIS — E785 Hyperlipidemia, unspecified: Secondary | ICD-10-CM | POA: Diagnosis not present

## 2022-01-06 DIAGNOSIS — Z7901 Long term (current) use of anticoagulants: Secondary | ICD-10-CM

## 2022-01-06 DIAGNOSIS — N183 Chronic kidney disease, stage 3 unspecified: Secondary | ICD-10-CM

## 2022-01-06 DIAGNOSIS — R269 Unspecified abnormalities of gait and mobility: Secondary | ICD-10-CM | POA: Diagnosis not present

## 2022-01-06 DIAGNOSIS — M48061 Spinal stenosis, lumbar region without neurogenic claudication: Secondary | ICD-10-CM | POA: Diagnosis not present

## 2022-01-06 NOTE — Progress Notes (Signed)
Provider:  Alain Honey, MD Location:      Place of Service:     PCP: Mast, [name], NP Patient Care Team: Mast, [name], NP as PCP - General (Internal Medicine) Leonie Man, MD as PCP - Cardiology (Cardiology) Mast, [name], NP as Nurse Practitioner (Internal Medicine) Lavonna Monarch, MD (Inactive) as Consulting Physician (Dermatology)  Extended Emergency Contact Information Primary Emergency Contact: [place] Mobile Phone: [phone] Relation: Daughter Secondary Emergency Contact: Weeks,Tiffany Mobile Phone: [phone] Relation: Daughter Preferred language: English Interpreter needed? No  Code Status:  Goals of Care: Advanced Directive information    12/22/2021   11:49 AM  Advanced Directives  Does Patient Have a Medical Advance Directive? Yes  Type of Paramedic of [place];Living will;Out of facility DNR (pink MOST or yellow form)  Does patient want to make changes to medical advance directive? No - Patient declined  Copy of [place] in Chart? Yes - validated most recent copy scanned in chart (See row information)  Pre-existing out of facility DNR order (yellow form or pink MOST form) Pink MOST form placed in chart (order not valid for inpatient use);Yellow form placed in chart (order not valid for inpatient use)      No chief complaint on file.   HPI: Patient is a 88 y.o. female seen today for medical management of chronic problems including A-fib, hypertension, lumbosacral spinal stenosis, anxiety and depression, and generalized arthritic complaints.  I visited the patient today.  She reported some 2-day history of anorexia but looking back through her chart this is not needed.  There has been no weight loss.  She has a cast on her left ankle where she suffered a fracture.  Is being treated by casting. She has a history of chronic back pain.  Currently she is on Norco.  There is some anxiety around her inability  to ambulate in her wheelchair since ankle fracture but in talking to the nurse the anxiety was present even before the ankle fracture.  Family is thinking she is in need of epidural for spinal stenosis but that has not happened and I do not see an the record plans for same.  Past Medical History:  Diagnosis Date   Atherosclerosis of native coronary artery 2001   LAD & Cx disease -- BMS PCI in 2001; CABG 2002.  PCI in 2006 (per-op [facility])   Atrial fibrillation and flutter ([place]) 04/2019   New diagnosis-major complaint was chest tightness and dyspnea.   Breast cancer ([place])    Cervix dysplasia    GERD (gastroesophageal reflux disease)    Heart attack ([place]) 2002, 2006   Both events reportedly were perioperatively. She has no recollection. Initial PCI was related to angina symptoms (bilateral arm pain)   High cholesterol    Hypertension    Lumbosacral spinal stenosis    Osteoarthritis    Skin cancer    Sleep apnea    Uses CPAP faithfully   Squamous cell carcinoma of skin 02/12/2020   in situ- left neck-anterior ([id number])   Past Surgical History:  Procedure Laterality Date   ABDOMINAL HYSTERECTOMY     bone density     CARDIAC CATHETERIZATION  2001    Dr. Glade Lloyd [facility]) 2 vessel CAD involving LAD-D1 and distal Cx-OM. Appearance of dilated tapered left main with no significant atherosclerosis on IVUS --  BMS PCI of pLAD ([place] BMS 3.0 mm x 15 mm) & with PTCA of ostial D1, PTCA of dLAD.  BMS PCI pCx [facility] BMS 3.5 mm x 11 mm) - unable to reopen dCx-OM (probable distal Atheroembolic occlusion.  Normal LVEF.   COLONOSCOPY     CORONARY ARTERY BYPASS GRAFT  2002   Unsure of details [facility], in [facility])   [place]  2001   ([place]) [place] BMS PCI pLAD (3.0 mm x 15 mm) -PTCA of jailed D1 as well as distal LAD; pCx ([place] BMS 3.5 mm x 11 mm) -unable to open distalCx-OM -thromboembolic occlusion   DIAGNOSTIC MAMMOGRAM     LEFT HEART CATH AND CORS/GRAFTS ANGIOGRAPHY  N/A 04/25/2019   Procedure: LEFT HEART CATH AND CORS/GRAFTS ANGIOGRAPHY;  Surgeon: Troy Sine, MD;  Location: [facility] INV[facility] CV LAB;;; ost-prox LCx stent @ OM1 ostium =100% CTO, Ost-prox LAD stent ~30% ISR, Ost D1 CTO, prox RCA ~30%. SCG-OM2 patent with ostial stent 30%, LIMA-LAD patent; CTO of SVG-D1.    left knee replacement     MASTECTOMY     NM MYOVIEW LTD  12/2014; 2018   Cardiologist (Dr. Kellie Simmering.  [place], [place] ph# [phone]) -->LEXISCAN: Nonischemic, "normal "   pap smear     tonsilectomy     TRANSTHORACIC ECHOCARDIOGRAM  12/2014   Echocardiogram 12/25/14- normal LVEF and systolic function, EF 82-50%, mild grade 1 diastolic dysfunction   TRANSTHORACIC ECHOCARDIOGRAM  04/24/2019   EF 65 to 70%.  Moderate septal LVH.  GR 1 DD-elevated EDP.  Normal PAP.  Relatively normal valves.  No aortic stenosis.    reports that she has never smoked. She has never used smokeless tobacco. She reports that she does not drink alcohol and does not use drugs. Social History   Socioeconomic History   Marital status: Widowed    Spouse name: Not on file   Number of children: Not on file   Years of education: Not on file   Highest education level: Not on file  Occupational History   Not on file  Tobacco Use   Smoking status: Never   Smokeless tobacco: Never  Vaping Use   Vaping Use: Never used  Substance and Sexual Activity   Alcohol use: No   Drug use: No   Sexual activity: Not Currently  Other Topics Concern   Not on file  Social History Narrative   Recently moved back to [place] (lives alone in a [place] apartment at [facility])   Do you drink/eat things with caffeine? 2 cups of coffee every day   What year were you married? [place] - twice widowed   No pets.      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 Has POA      Johnny Bridge (Daughter) [phone])      Primary  Emergency Contact: Goshorn,JOHN L   Address: [place],  [place] [address]   Home Phone: [phone]   Social Determinants of Health   Financial Resource Strain: Low Risk  (02/07/2021)   Overall Financial Resource Strain (CARDIA)    Difficulty of Paying Living Expenses: Not hard at all  Food Insecurity: No Food Insecurity (02/07/2021)   Hunger Vital Sign    Worried About Running Out of Food in the Last Year: Never true    Ran Out of Food in the Last Year: Never true  Transportation Needs: No Transportation Needs (02/07/2021)   PRAPARE - Transportation  Lack of Transportation (Medical): No    Lack of Transportation (Non-Medical): No  Physical Activity: Sufficiently Active (02/07/2021)   Exercise Vital Sign    Days of Exercise per Week: 5 days    Minutes of Exercise per Session: 30 min  Stress: No Stress Concern Present (02/07/2021)   [place]    Feeling of Stress : Only a little  Social Connections: Moderately Isolated (02/07/2021)   Social Connection and Isolation Panel [NHANES]    Frequency of Communication with Friends and Family: More than three times a week    Frequency of Social Gatherings with Friends and Family: More than three times a week    Attends Religious Services: More than 4 times per year    Active Member of [employer] or Organizations: No    Attends Archivist Meetings: Never    Marital Status: Widowed  Intimate Partner Violence: Not At Risk (02/07/2021)   Humiliation, Afraid, Rape, and Kick questionnaire    Fear of Current or Ex-Partner: No    Emotionally Abused: No    Physically Abused: No    Sexually Abused: No    Functional Status Survey:    Family History  Problem Relation Age of Onset   Stroke Mother    Heart disease Father     Health Maintenance  Topic Date Due   DTaP/Tdap/Td (1 - Tdap) 12/02/2017   HEMOGLOBIN A1C  12/01/2019   OPHTHALMOLOGY EXAM  05/30/2021    FOOT EXAM  10/17/2021   Medicare Annual Wellness (AWV)  02/07/2022   Pneumonia Vaccine 67+ Years old  Completed   INFLUENZA VACCINE  Completed   DEXA SCAN  Completed   Zoster Vaccines- Shingrix  Completed   HPV VACCINES  Aged Out   COVID-19 Vaccine  Discontinued    Allergies  Allergen Reactions   Codeine Nausea Only    Outpatient Encounter Medications as of 01/06/2022  Medication Sig   amiodarone (PACERONE) 100 MG tablet Take 100 mg by mouth daily.   Ammonium Lactate (AMLACTIN EX) thin layer topically; topical Once A Day - PRN Special Instructions: May apply topically once daily PRN for dry skin.   apixaban (ELIQUIS) 5 MG TABS tablet Take 1 tablet (5 mg total) by mouth 2 (two) times daily.   atorvastatin (LIPITOR) 80 MG tablet Take 1 tablet (80 mg total) by mouth daily.   Cholecalciferol (VITAMIN D3) 50 MCG (2000 UT) TABS Take 2,000 Units by mouth daily after breakfast.   docusate sodium (COLACE) 100 MG capsule Take 1 capsule (100 mg total) by mouth daily.   DULoxetine (CYMBALTA) 30 MG capsule Take 1 capsule (30 mg total) by mouth daily.   escitalopram (LEXAPRO) 5 MG tablet Take 1 tablet (5 mg total) by mouth daily.   fexofenadine (ALLEGRA) 180 MG tablet Take 1 tablet (180 mg total) by mouth daily as needed for allergies or rhinitis.   fluticasone (FLONASE) 50 MCG/ACT nasal spray Place 1 spray into both nostrils daily as needed for allergies or rhinitis.   HYDROcodone-acetaminophen (NORCO/VICODIN) 5-325 MG tablet Take 1 tablet by mouth every 6 (six) hours.   lidocaine (SALONPAS PAIN RELIEVING) 4 % Place 1 patch onto the skin daily.   LORazepam (ATIVAN) 0.5 MG tablet Take 0.5 mg by mouth every 8 (eight) hours.   losartan (COZAAR) 25 MG tablet Take 25 mg by mouth daily.   melatonin 5 MG TABS Take 5 mg by mouth at bedtime.   metoprolol succinate (TOPROL-XL) 50 MG  24 hr tablet Take 1 tablet (50 mg total) by mouth daily. Take with or immediately following a meal.   nitroGLYCERIN  (NITROSTAT) 0.4 MG SL tablet Place 0.4 mg under the tongue as needed for chest pain (Every 5 minutes x 3 doses).   nystatin (MYCOSTATIN/NYSTOP) powder Apply 1 Application topically 2 (two) times daily.   omeprazole (PRILOSEC OTC) 20 MG tablet Take 1 tablet (20 mg total) by mouth daily as needed.   polyethylene glycol (MIRALAX / GLYCOLAX) 17 g packet Take 17 g by mouth daily. Give with 4 oz of water.   senna-docusate (SENOKOT-S) 8.6-50 MG tablet Take 2 tablets by mouth at bedtime.   vitamin B-12 (CYANOCOBALAMIN) 1000 MCG tablet Take 1,000 mcg by mouth daily.   zinc oxide 20 % ointment Apply 1 application topically 3 (three) times daily as needed for irritation. Apply to buttocks after every incontinent episode and as needed for redness.   No facility-administered encounter medications on file as of 01/06/2022.    Review of Systems  Constitutional:  Positive for appetite change.  Respiratory: Negative.    Cardiovascular: Negative.   Endocrine: Positive for polydipsia.  Musculoskeletal:  Positive for arthralgias, back pain and gait problem.  Psychiatric/Behavioral:  Positive for agitation. The patient is nervous/anxious.   All other systems reviewed and are negative.   There were no vitals filed for this visit. There is no height or weight on file to calculate BMI. Physical Exam Vitals and nursing note reviewed.  Constitutional:      Appearance: Normal appearance.  HENT:     Mouth/Throat:     Mouth: Mucous membranes are moist.     Pharynx: Oropharynx is clear.  Eyes:     Pupils: Pupils are equal, round, and reactive to light.  Cardiovascular:     Rate and Rhythm: Normal rate and regular rhythm.  Pulmonary:     Effort: Pulmonary effort is normal.     Breath sounds: Normal breath sounds.  Abdominal:     General: Bowel sounds are normal.     Palpations: Abdomen is soft.  Musculoskeletal:     Comments: Cast on left ankle.  Has follow-up appointment with orthopedist later this week   Skin:    General: Skin is warm and dry.  Neurological:     General: No focal deficit present.     Mental Status: She is alert and oriented to person, place, and time.  Psychiatric:        Mood and Affect: Mood normal.     Labs reviewed: Basic Metabolic Panel: Recent Labs    06/19/21 0006 06/19/21 0010 08/26/21 0000 10/21/21 0630 12/04/21 0641  NA 140 141 141 140 138  K 4.1 4.0 4.1 4.3 4.2  CL 104 103 103 102 105  CO2 28  --  29* 30* 25  GLUCOSE 108* 106*  --   --  112*  BUN '14 17 17 13 15  '$ CREATININE 0.85 0.80 0.9 0.8 0.84  CALCIUM 9.8  --  10.0 10.0 9.7  MG  --   --   --   --  2.1   Liver Function Tests: Recent Labs    03/26/21 0000 12/04/21 0641  AST 15 20  ALT 12 16  ALKPHOS 47 54  BILITOT  --  0.9  PROT  --  7.4  ALBUMIN 3.8 3.6   Recent Labs    12/04/21 0641  LIPASE 29   No results for input(s): "AMMONIA" in the last 8760 hours. CBC: Recent  Labs    03/26/21 0000 05/01/21 0000 06/19/21 0006 06/19/21 0010 12/04/21 0641  WBC 6.5 6.9 7.2  --  8.7  NEUTROABS 3,627.00  --  3.5  --  5.3  HGB 11.9* 11.7* 13.2 13.9 12.9  HCT 35* 35* 41.8 41.0 39.4  MCV  --   --  105.0*  --  102.1*  PLT 226 221 216  --  249   Cardiac Enzymes: No results for input(s): "CKTOTAL", "CKMB", "CKMBINDEX", "TROPONINI" in the last 8760 hours. BNP: Invalid input(s): "POCBNP" Lab Results  Component Value Date   HGBA1C 5.9 (H) 05/31/2019   Lab Results  Component Value Date   TSH 1.390 07/03/2021   Lab Results  Component Value Date   VITAMINB12 730 10/06/2021   No results found for: "FOLATE" No results found for: "IRON", "TIBC", "FERRITIN"  Imaging and Procedures obtained prior to SNF admission: CT ABDOMEN PELVIS W CONTRAST  Result Date: 12/04/2021 CLINICAL DATA:  Left lower quadrant abdominal pain. EXAM: CT ABDOMEN AND PELVIS WITH CONTRAST TECHNIQUE: Multidetector CT imaging of the abdomen and pelvis was performed using the standard protocol following bolus  administration of intravenous contrast. RADIATION DOSE REDUCTION: This exam was performed according to the departmental dose-optimization program which includes automated exposure control, adjustment of the mA and/or kV according to patient size and/or use of iterative reconstruction technique. CONTRAST:  191m OMNIPAQUE IOHEXOL 300 MG/ML  SOLN COMPARISON:  None Available. FINDINGS: Lower chest: Patchy airspace opacities are present at the lung bases, left greater than right. No significant effusions are present. Hepatobiliary: Liver is unremarkable. Layering gallstones are present without inflammatory change. The common bile duct is within normal limits for age. Pancreas: Unremarkable. No pancreatic ductal dilatation or surrounding inflammatory changes. Spleen: Normal in size without focal abnormality. Adrenals/Urinary Tract: The adrenal glands are within normal limits bilaterally. An 8.8 cm simple exophytic cyst emanates from the posterior aspect of the left kidney. Kidneys are otherwise unremarkable. No parenchymal lesion or stone is present. No obstruction is present. Ureters are within normal limits. The urinary bladder is normal. Stomach/Bowel: The stomach is within normal limits. Duodenal ulcer noted without inflammatory change. Small bowel is un remarkable proximally. Distal small bowel herniates into the right inguinal canal without obstruction. Terminal ileum is within normal limits. Appendix is visualized and within normal limits. Ascending colon is normal. Moderate stool is present throughout the transverse colon without obstruction. No inflammation is present. Descending colon is normal. Sigmoid colon is somewhat redundant without focal inflammation or obstruction. Vascular/Lymphatic: Atherosclerotic calcifications are present in the aorta and branch vessels. No aneurysm is present. No significant adenopathy is present. Reproductive: Status post hysterectomy. No adnexal masses. Other: Bilateral  paraumbilical hernias contain fat without bowel. Right inguinal hernia contains a loop of small bowel as described above. A left paramedian ventral hernia anterior to the stomach contains fat without bowel. The opening measures 6 mm. No free fluid or free air is present. Musculoskeletal: Grade 1 degenerative anterolisthesis is present at L4-5 and L5-S1. An inferior endplate fracture at L2 is age indeterminate. No other acute fractures are present. Bony pelvis is within normal limits. The hips are located and within normal limits. IMPRESSION: 1. No acute or focal lesion to explain the patient's left lower quadrant abdominal pain. 2. Right inguinal hernia contains a loop of small bowel without obstruction. 3. Bilateral paraumbilical hernias contain fat without bowel. 4. Left paramedian ventral hernia anterior to the stomach contains fat without bowel. 5. Cholelithiasis without evidence of cholecystitis. 6. Patchy  airspace opacities at the lung bases, left greater than right. This may represent atelectasis or infection. 7. Age indeterminate inferior endplate fracture at L2. 8. Grade 1 degenerative anterolisthesis at L4-5 and L5-S1. 9. 8.8 cm simple exophytic cyst emanates from the posterior aspect of the left kidney. No follow-up imaging is recommended. JACR 2018 Feb; 264-273, Management of the Incidental Renal Mass on CT, [employer] 2021; 814-848, Bosniak Classification of Cystic Renal Masses, Version 2019. 10.  Aortic Atherosclerosis (ICD10-I70.0). Electronically Signed   By: San Morelle M.D.   On: 12/04/2021 08:39   DG Shoulder Right  Result Date: 12/04/2021 CLINICAL DATA:  Persistent shoulder pain after falling several months previously EXAM: RIGHT SHOULDER - 2+ VIEW COMPARISON:  Prior chest x-ray 04/23/2019 FINDINGS: No acute fracture or malalignment. Degenerative osteoarthritis is present at the acromioclavicular joint. Rounded calcification overlies the right chest. This may be within the  breast, or the lung but is unchanged compared to prior imaging from 1191 and almost certainly benign. Chronic bronchitic changes visualized in the lung. Mild degenerative changes at the rotator cuff insertion. IMPRESSION: 1. Degenerative osteoarthritis at the acromioclavicular joint. 2. Degenerative changes at the rotator cuff insertion. 3. No acute fracture or malalignment. Electronically Signed   By: Jacqulynn Cadet M.D.   On: 12/04/2021 07:14    Assessment/Plan 1. Chronic heart failure with preserved ejection fraction (HCC) Well compensated.  No diuretic  2. Stage 3 chronic kidney disease, unspecified whether stage 3a or 3b CKD ([place]) Yet needs have been normal recently  3. Slow transit constipation Patient on opiate for pain; she is on Colace as well as MiraLAX for constipation  4. Current use of long term anticoagulation Status post CVA TIA as well as A-fib  5. Edema, unspecified type There is some edema in the left lower leg above the cast.  Legs are elevated  6. Gait abnormality Prior to ankle fracture patient moved about in wheelchair but unable to do so with the fracture  7. Hyperlipidemia, goal<70 On high-dose atorvastatin; LDL at 91  8. Paroxysmal atrial fibrillation/flutter (HCC) Taking amiodarone as well as metoprolol  9. Spinal stenosis of lumbar region without neurogenic claudication I suspect this is the source of much of her pain.  Whether or not injections might benefit his debatable.  Will continue hydrocodone since it does seem to help    Family/ staff Communication:   Labs/tests ordered:  Lillette Boxer. Sabra Heck, [place] [address] [place], [place] Office [phone]

## 2022-01-07 ENCOUNTER — Other Ambulatory Visit: Payer: Self-pay | Admitting: Adult Health

## 2022-01-07 MED ORDER — LORAZEPAM 0.5 MG PO TABS: 0.5000 mg | ORAL_TABLET | Freq: Three times a day (TID) | ORAL | 0 refills | Status: DC

## 2022-01-08 ENCOUNTER — Encounter: Payer: Self-pay | Admitting: Nurse Practitioner

## 2022-01-09 ENCOUNTER — Encounter: Payer: Self-pay | Admitting: Cardiology

## 2022-01-09 DIAGNOSIS — S8262XA Displaced fracture of lateral malleolus of left fibula, initial encounter for closed fracture: Secondary | ICD-10-CM | POA: Diagnosis not present

## 2022-01-14 DIAGNOSIS — M25511 Pain in right shoulder: Secondary | ICD-10-CM | POA: Diagnosis not present

## 2022-01-14 DIAGNOSIS — M6281 Muscle weakness (generalized): Secondary | ICD-10-CM | POA: Diagnosis not present

## 2022-01-14 DIAGNOSIS — R29898 Other symptoms and signs involving the musculoskeletal system: Secondary | ICD-10-CM | POA: Diagnosis not present

## 2022-01-16 ENCOUNTER — Encounter: Payer: Self-pay | Admitting: Nurse Practitioner

## 2022-01-16 ENCOUNTER — Non-Acute Institutional Stay (SKILLED_NURSING_FACILITY): Payer: Medicare Other | Admitting: Nurse Practitioner

## 2022-01-16 DIAGNOSIS — S82892D Other fracture of left lower leg, subsequent encounter for closed fracture with routine healing: Secondary | ICD-10-CM

## 2022-01-16 DIAGNOSIS — K5901 Slow transit constipation: Secondary | ICD-10-CM | POA: Diagnosis not present

## 2022-01-16 DIAGNOSIS — N183 Chronic kidney disease, stage 3 unspecified: Secondary | ICD-10-CM | POA: Diagnosis not present

## 2022-01-16 DIAGNOSIS — F01B3 Vascular dementia, moderate, with mood disturbance: Secondary | ICD-10-CM | POA: Diagnosis not present

## 2022-01-16 DIAGNOSIS — K219 Gastro-esophageal reflux disease without esophagitis: Secondary | ICD-10-CM | POA: Diagnosis not present

## 2022-01-16 DIAGNOSIS — M159 Polyosteoarthritis, unspecified: Secondary | ICD-10-CM

## 2022-01-16 DIAGNOSIS — R29898 Other symptoms and signs involving the musculoskeletal system: Secondary | ICD-10-CM | POA: Diagnosis not present

## 2022-01-16 DIAGNOSIS — I639 Cerebral infarction, unspecified: Secondary | ICD-10-CM

## 2022-01-16 DIAGNOSIS — I480 Paroxysmal atrial fibrillation: Secondary | ICD-10-CM | POA: Diagnosis not present

## 2022-01-16 DIAGNOSIS — I10 Essential (primary) hypertension: Secondary | ICD-10-CM

## 2022-01-16 DIAGNOSIS — F418 Other specified anxiety disorders: Secondary | ICD-10-CM

## 2022-01-16 DIAGNOSIS — I25119 Atherosclerotic heart disease of native coronary artery with unspecified angina pectoris: Secondary | ICD-10-CM | POA: Diagnosis not present

## 2022-01-16 DIAGNOSIS — K4090 Unilateral inguinal hernia, without obstruction or gangrene, not specified as recurrent: Secondary | ICD-10-CM | POA: Diagnosis not present

## 2022-01-16 DIAGNOSIS — M25511 Pain in right shoulder: Secondary | ICD-10-CM | POA: Diagnosis not present

## 2022-01-16 DIAGNOSIS — R627 Adult failure to thrive: Secondary | ICD-10-CM | POA: Insufficient documentation

## 2022-01-16 DIAGNOSIS — F5105 Insomnia due to other mental disorder: Secondary | ICD-10-CM

## 2022-01-16 DIAGNOSIS — M6281 Muscle weakness (generalized): Secondary | ICD-10-CM | POA: Diagnosis not present

## 2022-01-16 NOTE — Assessment & Plan Note (Signed)
Supportive care is SNF [facility]

## 2022-01-16 NOTE — Progress Notes (Signed)
Location:   [facility] [place] Room Number: 35 Place of Service:  SNF (31) Provider: Lennie Odor Xena Propst NP  Daryon Remmert X, NP  Patient Care Team: Ladale Sherburn X, NP as PCP - General (Internal Medicine) Leonie Jaidev Sanger, MD as PCP - Cardiology (Cardiology) Arohi Salvatierra X, NP as Nurse Practitioner (Internal Medicine) Lavonna Monarch, MD (Inactive) as Consulting Physician (Dermatology)  Extended Emergency Contact Information Primary Emergency Contact: Twining Mobile Phone: [phone] Relation: Daughter Secondary Emergency Contact: Weeks,Tiffany Mobile Phone: [phone] Relation: Daughter Preferred language: English Interpreter needed? No  Code Status: DNR Goals of care: Advanced Directive information    12/22/2021   11:49 AM  Advanced Directives  Does Patient Have a Medical Advance Directive? Yes  Type of Paramedic of [place];Living will;Out of facility DNR (pink MOST or yellow form)  Does patient want to make changes to medical advance directive? No - Patient declined  Copy of [place] in Chart? Yes - validated most recent copy scanned in chart (See row information)  Pre-existing out of facility DNR order (yellow form or pink MOST form) Pink MOST form placed in chart (order not valid for inpatient use);Yellow form placed in chart (order not valid for inpatient use)     Chief Complaint  Patient presents with   Acute Visit    restlessness    HPI:  Pt is a 89 y.o. female seen today for an acute visit for progressing of restlessness, poor appetite, Lorazepam 0.5 q8hrs is losing its effectiveness.    Pain is better controlled with Norco, the patient is noted less oral intake, generalized weakness, progressing of dementia.              12/21/21 for left ankle fracture, in cast, f/u Ortho  ED eval 12/04/21 for pain R shoulder-X-ray showed no acute fx, L hip, LLQ pain-CT abd identified inguinal hernia  w/o obstruction or gangrene-no  further c/o abd pain. OA pain is managed with Norco. HPOA daughter desires comfort measures presently.              R inguinal hernia  w/o obstruction or gangrene-no further c/o abd pain, s/p surgeon consultation             TIA, stable, on Eliquis, Atorvastatin.  CVA, Neurology: cerebral infarction,MRI chronic right cerebellar stroke, mild to moderate generalized atrophy, small vessel disease with multiple chronic lacunar infarcts, possible left sided sed deficits,  transient vision disturbance of R eye.  OA, left knee s/p TKR, MR lumbar, severe spinal 11/13/20 spinal and foraminal stenosis with left sciatica, Pain in the right shoulder,  in the left shoulder, hip, knee, takes Norco, better controlled             Constipation, stable, on Senna, MiraLax.              Afib, takes Metoprolol, Eliquis, Amiodarone, f/u cardiology. TSH 1.39 07/03/21             HTN, takes Losartan, Metoprolol.              GERD, stable, takes Omeprazole, Hgb 12.9 12/04/21             CAD, stable, Hx of CABG Cath 04/25/19, prn NTG, takes Atorvastatin. LDL 74 04/05/21             CKD Bun/creat 15/0.84 12/04/21             Insomnia/OSA, not using CPAP,  Lorazepam helps.  Depression, appears restlessness, off Sertraline 2/2 confusion. Reduced Lexapro '5mg'$ , added Cymbalta '30mg'$  qd 12/17/21, q8hr Lorazepam, off Depakote 2/2 to generalized weakness and sleepiness             Vascular cognitive impairment, MMSE 27/30, SNF for supportive care. Vit B12 324 07/03/21, on Vit B12 1083mg qd.              BLE edema, not apparent,  EF 60% 08/03/21, off Furosemide   Past Medical History:  Diagnosis Date   Atherosclerosis of native coronary artery 2001   LAD & Cx disease -- BMS PCI in 2001; CABG 2002.  PCI in 2006 (per-op [facility])   Atrial fibrillation and flutter ([place] 04/2019   New diagnosis-major complaint was chest tightness and dyspnea.   Breast cancer ([facility]    Cervix dysplasia    GERD (gastroesophageal reflux disease)     Heart attack ([facility] 2002, 2006   Both events reportedly were perioperatively. She has no recollection. Initial PCI was related to angina symptoms (bilateral arm pain)   High cholesterol    Hypertension    Lumbosacral spinal stenosis    Osteoarthritis    Skin cancer    Sleep apnea    Uses CPAP faithfully   Squamous cell carcinoma of skin 02/12/2020   in situ- left neck-anterior (CX35FU)   Past Surgical History:  Procedure Laterality Date   ABDOMINAL HYSTERECTOMY     bone density     CARDIAC CATHETERIZATION  2001    Dr. TGlade Lloyd([facility] 2 vessel CAD involving LAD-D1 and distal Cx-OM. Appearance of dilated tapered left main with no significant atherosclerosis on IVUS --  BMS PCI of pLAD ([facility] BMS 3.0 mm x 15 mm) & with PTCA of ostial D1, PTCA of dLAD.  [facility] - unable to reopen dCx-OM (probable distal Atheroembolic occlusion.  Normal LVEF.   COLONOSCOPY     CORONARY ARTERY BYPASS GRAFT  2002   [facility] details [facility]   [place] [place]   [place]) [facility] BMS PCI pLAD (3.0 mm x 15 mm) -PTCA of jailed D1 as well as distal LAD; pCx ([facility] BMS 3.5 mm x 11 mm) -unable to open distalCx-OM -thromboembolic occlusion   DIAGNOSTIC MAMMOGRAM     LEFT HEART CATH AND CORS/GRAFTS ANGIOGRAPHY N/A 04/25/2019   Procedure: LEFT HEART CATH AND CORS/GRAFTS ANGIOGRAPHY;  Surgeon: KTroy Sine MD;  Location: [facility] IN[facility] LAB;;; ost-prox LCx stent @ OM1 ostium =100% CTO, Ost-prox LAD stent ~30% ISR, Ost D1 CTO, prox RCA ~30%. SCG-OM2 patent with ostial stent 30%, LIMA-LAD patent; CTO of SVG-D1.    left knee replacement     MASTECTOMY     NM MYOVIEW LTD  12/2014; 2018   Cardiologist (Dr. GKellie Simmering  [place], [phone] -->LEXISCAN: Nonischemic, "normal "   pap smear     tonsilectomy     TRANSTHORACIC ECHOCARDIOGRAM  12/2014   Echocardiogram 12/25/14- normal LVEF and systolic function, EF 596-22% mild grade 1 diastolic  dysfunction   TRANSTHORACIC ECHOCARDIOGRAM  04/24/2019   EF 65 to 70%.  Moderate septal LVH.  GR 1 DD-elevated EDP.  Normal PAP.  Relatively normal valves.  No aortic stenosis.    Allergies  Allergen Reactions   Codeine Nausea Only    Allergies as of 01/16/2022       Reactions   Codeine Nausea Only        Medication List  Accurate as of January 16, 2022 11:57 AM. If you have any questions, ask your nurse or doctor.          amiodarone 100 MG tablet Commonly known as: PACERONE Take 100 mg by mouth daily.   AMLACTIN EX thin layer topically; topical Once A Day - PRN Special Instructions: May apply topically once daily PRN for dry skin.   apixaban 5 MG Tabs tablet Commonly known as: Eliquis Take 1 tablet (5 mg total) by mouth 2 (two) times daily.   atorvastatin 80 MG tablet Commonly known as: LIPITOR Take 1 tablet (80 mg total) by mouth daily.   cyanocobalamin 1000 MCG tablet Commonly known as: VITAMIN B12 Take 1,000 mcg by mouth daily.   docusate sodium 100 MG capsule Commonly known as: COLACE Take 1 capsule (100 mg total) by mouth daily.   DULoxetine 30 MG capsule Commonly known as: Cymbalta Take 1 capsule (30 mg total) by mouth daily.   escitalopram 5 MG tablet Commonly known as: Lexapro Take 1 tablet (5 mg total) by mouth daily.   fexofenadine 180 MG tablet Commonly known as: ALLEGRA Take 1 tablet (180 mg total) by mouth daily as needed for allergies or rhinitis.   fluticasone 50 MCG/ACT nasal spray Commonly known as: FLONASE Place 1 spray into both nostrils daily as needed for allergies or rhinitis.   HYDROcodone-acetaminophen 5-325 MG tablet Commonly known as: NORCO/VICODIN Take 1 tablet by mouth every 6 (six) hours.   LORazepam 0.5 MG tablet Commonly known as: ATIVAN Take 1 tablet (0.5 mg total) by mouth every 8 (eight) hours.   losartan 25 MG tablet Commonly known as: COZAAR Take 25 mg by mouth daily.   melatonin 5 MG Tabs Take 5  mg by mouth at bedtime.   metoprolol succinate 50 MG 24 hr tablet Commonly known as: TOPROL-XL Take 1 tablet (50 mg total) by mouth daily. Take with or immediately following a meal.   nitroGLYCERIN 0.4 MG SL tablet Commonly known as: NITROSTAT Place 0.4 mg under the tongue as needed for chest pain (Every 5 minutes x 3 doses).   nystatin powder Commonly known as: MYCOSTATIN/NYSTOP Apply 1 Application topically 2 (two) times daily.   omeprazole 20 MG tablet Commonly known as: PRILOSEC OTC Take 1 tablet (20 mg total) by mouth daily as needed.   polyethylene glycol 17 g packet Commonly known as: MIRALAX / GLYCOLAX Take 17 g by mouth daily. Give with 4 oz of water.   Salonpas Pain Relieving 4 % Generic drug: lidocaine Place 1 patch onto the skin daily.   senna-docusate 8.6-50 MG tablet Commonly known as: Senokot-S Take 2 tablets by mouth at bedtime.   Vitamin D3 50 MCG (2000 UT) Tabs Take 2,000 Units by mouth daily after breakfast.   zinc oxide 20 % ointment Apply 1 application topically 3 (three) times daily as needed for irritation. Apply to buttocks after every incontinent episode and as needed for redness.        Review of Systems  Constitutional:  Positive for activity change, appetite change and fatigue. Negative for fever.  HENT:  Negative for congestion, hearing loss and voice change.   Eyes:  Negative for visual disturbance.       C/o eye sight issue upon first awake in am, mostly R eye, better as day goes on.   Respiratory:  Negative for cough, chest tightness, shortness of breath and wheezing.   Cardiovascular:  Negative for leg swelling.  Gastrointestinal:  Negative for abdominal pain, blood  in stool, constipation, nausea and vomiting.  Genitourinary:  Negative for dysuria and urgency.  Musculoskeletal:  Positive for arthralgias, back pain and gait problem.       S/p L TKR,  left hip, left knee, R+L shoulder pain. Left lower leg cast  Skin:  Negative for  color change.          Neurological:  Positive for numbness. Negative for speech difficulty and headaches.       Memory lapses. C/o numbness right side of tongue.   Hematological:  Does not bruise/bleed easily.  Psychiatric/Behavioral:  Positive for agitation and sleep disturbance. Negative for behavioral problems and dysphoric mood. The patient is nervous/anxious.        Better    Immunization History  Administered Date(s) Administered   Fluad Quad(high Dose 65+) 10/28/2020   Influenza, High Dose Seasonal PF 10/21/2016, 09/24/2017, 10/25/2019, 11/05/2021   Influenza-Unspecified 10/03/2011, 10/04/2012, 01/13/2016, 11/05/2021   Moderna SARS-COV2 Booster Vaccination 11/21/2019, 06/11/2020, 05/30/2021   Moderna Sars-Covid-2 Vaccination 01/16/2019, 02/13/2019, 03/13/2019   PNEUMOCOCCAL CONJUGATE-20 07/03/2021   Pneumococcal-Unspecified 01/13/2016   Tetanus 12/01/2017   Zoster Recombinat (Shingrix) 08/24/2018, 02/22/2020   Pertinent  Health Maintenance Due  Topic Date Due   HEMOGLOBIN A1C  12/01/2019   OPHTHALMOLOGY EXAM  05/30/2021   FOOT EXAM  10/17/2021   INFLUENZA VACCINE  Completed   DEXA SCAN  Completed      02/07/2021   12:47 PM 06/19/2021   12:11 AM 09/11/2021    3:39 PM 12/04/2021    4:51 AM 12/17/2021    4:07 PM  Fall Risk  Falls in the past year? 1  0  1  Was there an injury with Fall? 0  0  1  Fall Risk Category Calculator 1  0  3  Fall Risk Category Low  Low  High  Patient Fall Risk Level Moderate fall risk Moderate fall risk Low fall risk High fall risk High fall risk  Patient at Risk for Falls Due to History of fall(s);Impaired balance/gait;Impaired mobility;Orthopedic patient  No Fall Risks  History of fall(s)  Patient at Risk for Falls Due to - Comments spinal stenosis of lumbar      Fall risk Follow up Falls evaluation completed;Education provided;Falls prevention discussed  Falls evaluation completed  Falls evaluation completed   Functional Status Survey:     Vitals:   01/16/22 1128  BP: (!) 165/91  Pulse: 80  Resp: 18  Temp: 98.1 F (36.7 C)  SpO2: 96%   There is no height or weight on file to calculate BMI. Physical Exam Constitutional:      Appearance: Normal appearance.  HENT:     Head: Normocephalic and atraumatic.     Nose: Nose normal.     Mouth/Throat:     Mouth: Mucous membranes are moist.  Eyes:     Extraocular Movements: Extraocular movements intact.     Pupils: Pupils are equal, round, and reactive to light.  Cardiovascular:     Rate and Rhythm: Normal rate and regular rhythm.     Heart sounds: Murmur heard.  Pulmonary:     Effort: Pulmonary effort is normal.     Breath sounds: Rales present.     Comments: Bibasilar rales present Abdominal:     General: Bowel sounds are normal.     Palpations: Abdomen is soft.     Tenderness: There is no abdominal tenderness. There is no right CVA tenderness, left CVA tenderness, guarding or rebound.     Hernia: A  hernia is present.     Comments: R inguinal hernia per CT abd.   Musculoskeletal:     Cervical back: Normal range of motion and neck supple.     Right lower leg: No edema.     Left lower leg: Edema present.     Comments: Trace edema left foot.  Slightly R arm weakness. persisted right shoulder/arm aches. No decreased ROM. Chronic pain in the left hip, knee, and aches in the left shoulder-no redness, swelling, deformity, or decreased ROM-except preexisting limited over head ROM. Pain in the left lower leg and ankle noted. Able to wiggle left toes.   Skin:    General: Skin is warm and dry.     Findings: Bruising and erythema present.     Comments: S/p right mastectomy.  Left lower leg bruise, mild warmth and redness, tenderness, increases swelling, dorsalis pedis pulse present.  RLE mild erythematous and swelling, slightly better after Furosemide resumed.   Neurological:     General: No focal deficit present.     Mental Status: She is alert and oriented to person,  place, and time. Mental status is at baseline.     Motor: Weakness present.     Coordination: Coordination normal.     Gait: Gait abnormal.     Comments: C/o numbness of the right side of tongue. Saw 2 fingers in front of her upon my examination left eye.   Psychiatric:        Mood and Affect: Mood normal.        Behavior: Behavior normal.        Thought Content: Thought content normal.     Labs reviewed: Recent Labs    06/19/21 0006 06/19/21 0010 08/26/21 0000 10/21/21 0630 12/04/21 0641  NA 140 141 141 140 138  K 4.1 4.0 4.1 4.3 4.2  CL 104 103 103 102 105  CO2 28  --  29* 30* 25  GLUCOSE 108* 106*  --   --  112*  BUN '14 17 17 13 15  '$ CREATININE 0.85 0.80 0.9 0.8 0.84  CALCIUM 9.8  --  10.0 10.0 9.7  MG  --   --   --   --  2.1   Recent Labs    03/26/21 0000 12/04/21 0641  AST 15 20  ALT 12 16  ALKPHOS 47 54  BILITOT  --  0.9  PROT  --  7.4  ALBUMIN 3.8 3.6   Recent Labs    03/26/21 0000 05/01/21 0000 06/19/21 0006 06/19/21 0010 12/04/21 0641  WBC 6.5 6.9 7.2  --  8.7  NEUTROABS 3,627.00  --  3.5  --  5.3  HGB 11.9* 11.7* 13.2 13.9 12.9  HCT 35* 35* 41.8 41.0 39.4  MCV  --   --  105.0*  --  102.1*  PLT 226 221 216  --  249   Lab Results  Component Value Date   TSH 1.390 07/03/2021   Lab Results  Component Value Date   HGBA1C 5.9 (H) 05/31/2019   Lab Results  Component Value Date   CHOL 175 07/03/2021   HDL 59 07/03/2021   LDLCALC 91 07/03/2021   TRIG 146 07/03/2021   CHOLHDL 2.4 04/05/2020    Significant Diagnostic Results in last 30 days:  No results found.  Assessment/Plan: Insomnia secondary to depression with anxiety appears restlessness, off Sertraline 2/2 confusion. Reduced Lexapro '5mg'$ , increase Cymbalta '60mg'$  qd(started 12/17/21), q8hr Lorazepam, off Depakote 2/2 to generalized weakness and sleepiness, adding prn  Lorazepam q8r prn. Update CBC/diff, CMP/eGFR  Osteoarthritis of multiple joints Pain is better controlled with Norco, the  patient is noted less oral intake, generalized weakness, progressing of dementia.   Closed left ankle fracture 12/21/21 for left ankle fracture, in cast, f/u Ortho   Right inguinal hernia   R inguinal hernia  w/o obstruction or gangrene-no further c/o abd pain, s/p surgeon consultation   CVA (cerebral vascular accident) ([place]) TIA, stable, on Eliquis, Atorvastatin.  CVA, Neurology: cerebral infarction,MRI chronic right cerebellar stroke, mild to moderate generalized atrophy, small vessel disease with multiple chronic lacunar infarcts, possible left sided sed deficits,  transient vision disturbance of R eye.   Constipation stable, on Senna, MiraLax.   Paroxysmal atrial fibrillation/flutter (HCC)  takes Metoprolol, Eliquis, Amiodarone, f/u cardiology. TSH 1.39 07/03/21  Essential hypertension Mild elevated, restless and not resting well are contributory, takes Losartan, Metoprolol, observe.   Gastroesophageal reflux disease stable, takes Omeprazole, Hgb 12.9 12/04/21  Coronary artery disease involving native coronary artery of native heart with angina pectoris (HCC) stable, Hx of CABG Cath 04/25/19, prn NTG, takes Atorvastatin. LDL 74 04/05/21  CKD (chronic kidney disease) stage 3, GFR 30-59 ml/min (HCC) Bun/creat 15/0.84 12/04/21  Vascular dementia (HCC) MMSE 27/30, SNF for supportive care. Vit B12 324 07/03/21, on Vit B12 1030mg qd.   [facility] to [facility]   Family/ staff Communication: plan of care reviewed with the patient and charge nurse.   Labs/tests ordered: CBC/diff, CMP/eGFR  Time spend 35 minutes.

## 2022-01-16 NOTE — Assessment & Plan Note (Signed)
stable, Hx of CABG Cath 04/25/19, prn NTG, takes Atorvastatin. LDL 74 04/05/21

## 2022-01-16 NOTE — Assessment & Plan Note (Signed)
MMSE 27/30, SNF for supportive care. Vit B12 324 07/03/21, on Vit B12 1071mg qd.

## 2022-01-16 NOTE — Assessment & Plan Note (Signed)
appears restlessness, off Sertraline 2/2 confusion. Reduced Lexapro '5mg'$ , increase Cymbalta '60mg'$  qd(started 12/17/21), q8hr Lorazepam, off Depakote 2/2 to generalized weakness and sleepiness, adding prn Lorazepam q8r prn. Update CBC/diff, CMP/eGFR

## 2022-01-16 NOTE — Assessment & Plan Note (Signed)
stable, on Senna, MiraLax.

## 2022-01-16 NOTE — Assessment & Plan Note (Signed)
stable, takes Omeprazole, Hgb 12.9 12/04/21

## 2022-01-16 NOTE — Assessment & Plan Note (Signed)
TIA, stable, on Eliquis, Atorvastatin.  CVA, Neurology: cerebral infarction,MRI chronic right cerebellar stroke, mild to moderate generalized atrophy, small vessel disease with multiple chronic lacunar infarcts, possible left sided sed deficits,  transient vision disturbance of R eye.

## 2022-01-16 NOTE — Assessment & Plan Note (Signed)
12/21/21 for left ankle fracture, in cast, f/u Ortho

## 2022-01-16 NOTE — Assessment & Plan Note (Signed)
R inguinal hernia  w/o obstruction or gangrene-no further c/o abd pain, s/p surgeon consultation

## 2022-01-16 NOTE — Assessment & Plan Note (Signed)
Mild elevated, restless and not resting well are contributory, takes Losartan, Metoprolol, observe.

## 2022-01-16 NOTE — Assessment & Plan Note (Signed)
Pain is better controlled with Norco, the patient is noted less oral intake, generalized weakness, progressing of dementia.

## 2022-01-16 NOTE — Assessment & Plan Note (Signed)
takes Metoprolol, Eliquis, Amiodarone, f/u cardiology. TSH 1.39 07/03/21

## 2022-01-16 NOTE — Assessment & Plan Note (Signed)
Bun/creat 15/0.84 12/04/21

## 2022-01-18 DIAGNOSIS — R29898 Other symptoms and signs involving the musculoskeletal system: Secondary | ICD-10-CM | POA: Diagnosis not present

## 2022-01-18 DIAGNOSIS — M6281 Muscle weakness (generalized): Secondary | ICD-10-CM | POA: Diagnosis not present

## 2022-01-18 DIAGNOSIS — M25511 Pain in right shoulder: Secondary | ICD-10-CM | POA: Diagnosis not present

## 2022-01-20 DIAGNOSIS — N189 Chronic kidney disease, unspecified: Secondary | ICD-10-CM | POA: Diagnosis not present

## 2022-01-20 LAB — COMPREHENSIVE METABOLIC PANEL
Albumin: 3.4 — AB (ref 3.5–5.0)
Calcium: 9.3 (ref 8.7–10.7)
Globulin: 3.1
eGFR: 79

## 2022-01-20 LAB — BASIC METABOLIC PANEL
BUN: 21 (ref 4–21)
CO2: 27 — AB (ref 13–22)
Chloride: 103 (ref 99–108)
Creatinine: 0.7 (ref 0.5–1.1)
Glucose: 99
Potassium: 4.3 mEq/L (ref 3.5–5.1)
Sodium: 139 (ref 137–147)

## 2022-01-20 LAB — HEPATIC FUNCTION PANEL
ALT: 14 U/L (ref 7–35)
AST: 19 (ref 13–35)
Alkaline Phosphatase: 90 (ref 25–125)
Bilirubin, Total: 0.8

## 2022-01-20 LAB — CBC AND DIFFERENTIAL
HCT: 38 (ref 36–46)
Hemoglobin: 13.2 (ref 12.0–16.0)
Neutrophils Absolute: 4000
Platelets: 231 10*3/uL (ref 150–400)
WBC: 7.3

## 2022-01-20 LAB — CBC: RBC: 3.93 (ref 3.87–5.11)

## 2022-01-21 DIAGNOSIS — I69351 Hemiplegia and hemiparesis following cerebral infarction affecting right dominant side: Secondary | ICD-10-CM | POA: Diagnosis not present

## 2022-01-21 DIAGNOSIS — Z9181 History of falling: Secondary | ICD-10-CM | POA: Diagnosis not present

## 2022-01-21 DIAGNOSIS — R29898 Other symptoms and signs involving the musculoskeletal system: Secondary | ICD-10-CM | POA: Diagnosis not present

## 2022-01-21 DIAGNOSIS — J302 Other seasonal allergic rhinitis: Secondary | ICD-10-CM | POA: Diagnosis not present

## 2022-01-21 DIAGNOSIS — I509 Heart failure, unspecified: Secondary | ICD-10-CM | POA: Diagnosis not present

## 2022-01-21 DIAGNOSIS — I4891 Unspecified atrial fibrillation: Secondary | ICD-10-CM | POA: Diagnosis not present

## 2022-01-21 DIAGNOSIS — I2510 Atherosclerotic heart disease of native coronary artery without angina pectoris: Secondary | ICD-10-CM | POA: Diagnosis not present

## 2022-01-21 DIAGNOSIS — I69318 Other symptoms and signs involving cognitive functions following cerebral infarction: Secondary | ICD-10-CM | POA: Diagnosis not present

## 2022-01-21 DIAGNOSIS — I130 Hypertensive heart and chronic kidney disease with heart failure and stage 1 through stage 4 chronic kidney disease, or unspecified chronic kidney disease: Secondary | ICD-10-CM | POA: Diagnosis not present

## 2022-01-21 DIAGNOSIS — M1990 Unspecified osteoarthritis, unspecified site: Secondary | ICD-10-CM | POA: Diagnosis not present

## 2022-01-21 DIAGNOSIS — M25511 Pain in right shoulder: Secondary | ICD-10-CM | POA: Diagnosis not present

## 2022-01-21 DIAGNOSIS — K219 Gastro-esophageal reflux disease without esophagitis: Secondary | ICD-10-CM | POA: Diagnosis not present

## 2022-01-21 DIAGNOSIS — N184 Chronic kidney disease, stage 4 (severe): Secondary | ICD-10-CM | POA: Diagnosis not present

## 2022-01-21 DIAGNOSIS — G894 Chronic pain syndrome: Secondary | ICD-10-CM | POA: Diagnosis not present

## 2022-01-21 DIAGNOSIS — F32A Depression, unspecified: Secondary | ICD-10-CM | POA: Diagnosis not present

## 2022-01-21 DIAGNOSIS — G4733 Obstructive sleep apnea (adult) (pediatric): Secondary | ICD-10-CM | POA: Diagnosis not present

## 2022-01-21 DIAGNOSIS — I69391 Dysphagia following cerebral infarction: Secondary | ICD-10-CM | POA: Diagnosis not present

## 2022-01-21 DIAGNOSIS — F419 Anxiety disorder, unspecified: Secondary | ICD-10-CM | POA: Diagnosis not present

## 2022-01-21 DIAGNOSIS — M6281 Muscle weakness (generalized): Secondary | ICD-10-CM | POA: Diagnosis not present

## 2022-01-21 DIAGNOSIS — E785 Hyperlipidemia, unspecified: Secondary | ICD-10-CM | POA: Diagnosis not present

## 2022-01-22 DIAGNOSIS — I509 Heart failure, unspecified: Secondary | ICD-10-CM | POA: Diagnosis not present

## 2022-01-22 DIAGNOSIS — I69391 Dysphagia following cerebral infarction: Secondary | ICD-10-CM | POA: Diagnosis not present

## 2022-01-22 DIAGNOSIS — I130 Hypertensive heart and chronic kidney disease with heart failure and stage 1 through stage 4 chronic kidney disease, or unspecified chronic kidney disease: Secondary | ICD-10-CM | POA: Diagnosis not present

## 2022-01-22 DIAGNOSIS — Z9181 History of falling: Secondary | ICD-10-CM | POA: Diagnosis not present

## 2022-01-22 DIAGNOSIS — I69318 Other symptoms and signs involving cognitive functions following cerebral infarction: Secondary | ICD-10-CM | POA: Diagnosis not present

## 2022-01-22 DIAGNOSIS — I69351 Hemiplegia and hemiparesis following cerebral infarction affecting right dominant side: Secondary | ICD-10-CM | POA: Diagnosis not present

## 2022-01-26 DIAGNOSIS — I69351 Hemiplegia and hemiparesis following cerebral infarction affecting right dominant side: Secondary | ICD-10-CM | POA: Diagnosis not present

## 2022-01-26 DIAGNOSIS — I69391 Dysphagia following cerebral infarction: Secondary | ICD-10-CM | POA: Diagnosis not present

## 2022-01-26 DIAGNOSIS — I69318 Other symptoms and signs involving cognitive functions following cerebral infarction: Secondary | ICD-10-CM | POA: Diagnosis not present

## 2022-01-26 DIAGNOSIS — I130 Hypertensive heart and chronic kidney disease with heart failure and stage 1 through stage 4 chronic kidney disease, or unspecified chronic kidney disease: Secondary | ICD-10-CM | POA: Diagnosis not present

## 2022-01-26 DIAGNOSIS — I509 Heart failure, unspecified: Secondary | ICD-10-CM | POA: Diagnosis not present

## 2022-01-26 DIAGNOSIS — Z9181 History of falling: Secondary | ICD-10-CM | POA: Diagnosis not present

## 2022-01-27 DIAGNOSIS — I130 Hypertensive heart and chronic kidney disease with heart failure and stage 1 through stage 4 chronic kidney disease, or unspecified chronic kidney disease: Secondary | ICD-10-CM | POA: Diagnosis not present

## 2022-01-27 DIAGNOSIS — I509 Heart failure, unspecified: Secondary | ICD-10-CM | POA: Diagnosis not present

## 2022-01-27 DIAGNOSIS — I69351 Hemiplegia and hemiparesis following cerebral infarction affecting right dominant side: Secondary | ICD-10-CM | POA: Diagnosis not present

## 2022-01-27 DIAGNOSIS — I69391 Dysphagia following cerebral infarction: Secondary | ICD-10-CM | POA: Diagnosis not present

## 2022-01-27 DIAGNOSIS — Z9181 History of falling: Secondary | ICD-10-CM | POA: Diagnosis not present

## 2022-01-27 DIAGNOSIS — I69318 Other symptoms and signs involving cognitive functions following cerebral infarction: Secondary | ICD-10-CM | POA: Diagnosis not present

## 2022-01-28 ENCOUNTER — Other Ambulatory Visit: Payer: Self-pay | Admitting: Adult Health

## 2022-01-28 MED ORDER — HYDROCODONE-ACETAMINOPHEN 5-325 MG PO TABS: 1.0000 | ORAL_TABLET | Freq: Four times a day (QID) | ORAL | 0 refills | Status: DC

## 2022-01-30 ENCOUNTER — Encounter: Payer: Self-pay | Admitting: Nurse Practitioner

## 2022-01-30 ENCOUNTER — Non-Acute Institutional Stay (SKILLED_NURSING_FACILITY): Payer: Medicare Other | Admitting: Nurse Practitioner

## 2022-01-30 ENCOUNTER — Encounter: Payer: Self-pay | Admitting: Cardiology

## 2022-01-30 DIAGNOSIS — I130 Hypertensive heart and chronic kidney disease with heart failure and stage 1 through stage 4 chronic kidney disease, or unspecified chronic kidney disease: Secondary | ICD-10-CM | POA: Diagnosis not present

## 2022-01-30 DIAGNOSIS — N183 Chronic kidney disease, stage 3 unspecified: Secondary | ICD-10-CM | POA: Diagnosis not present

## 2022-01-30 DIAGNOSIS — F5105 Insomnia due to other mental disorder: Secondary | ICD-10-CM

## 2022-01-30 DIAGNOSIS — K219 Gastro-esophageal reflux disease without esophagitis: Secondary | ICD-10-CM | POA: Diagnosis not present

## 2022-01-30 DIAGNOSIS — I69351 Hemiplegia and hemiparesis following cerebral infarction affecting right dominant side: Secondary | ICD-10-CM | POA: Diagnosis not present

## 2022-01-30 DIAGNOSIS — K4090 Unilateral inguinal hernia, without obstruction or gangrene, not specified as recurrent: Secondary | ICD-10-CM | POA: Diagnosis not present

## 2022-01-30 DIAGNOSIS — I480 Paroxysmal atrial fibrillation: Secondary | ICD-10-CM | POA: Diagnosis not present

## 2022-01-30 DIAGNOSIS — I639 Cerebral infarction, unspecified: Secondary | ICD-10-CM

## 2022-01-30 DIAGNOSIS — R609 Edema, unspecified: Secondary | ICD-10-CM

## 2022-01-30 DIAGNOSIS — M159 Polyosteoarthritis, unspecified: Secondary | ICD-10-CM

## 2022-01-30 DIAGNOSIS — K5901 Slow transit constipation: Secondary | ICD-10-CM | POA: Diagnosis not present

## 2022-01-30 DIAGNOSIS — I69318 Other symptoms and signs involving cognitive functions following cerebral infarction: Secondary | ICD-10-CM | POA: Diagnosis not present

## 2022-01-30 DIAGNOSIS — I10 Essential (primary) hypertension: Secondary | ICD-10-CM | POA: Diagnosis not present

## 2022-01-30 DIAGNOSIS — F418 Other specified anxiety disorders: Secondary | ICD-10-CM

## 2022-01-30 DIAGNOSIS — F0390 Unspecified dementia without behavioral disturbance: Secondary | ICD-10-CM | POA: Diagnosis not present

## 2022-01-30 DIAGNOSIS — I25119 Atherosclerotic heart disease of native coronary artery with unspecified angina pectoris: Secondary | ICD-10-CM | POA: Diagnosis not present

## 2022-01-30 DIAGNOSIS — Z9181 History of falling: Secondary | ICD-10-CM | POA: Diagnosis not present

## 2022-01-30 DIAGNOSIS — I69391 Dysphagia following cerebral infarction: Secondary | ICD-10-CM | POA: Diagnosis not present

## 2022-01-30 DIAGNOSIS — I509 Heart failure, unspecified: Secondary | ICD-10-CM | POA: Diagnosis not present

## 2022-01-30 NOTE — Progress Notes (Signed)
Location:  [facility] Room Number: 34-A Place of Service:  SNF (31) Provider:  ManXie Amore Ackman,NP  Adalay Azucena X, NP  Patient Care Team: Jazmin Vensel X, NP as PCP - General (Internal Medicine) Leonie Burhanuddin Kohlmann, MD as PCP - Cardiology (Cardiology) Thersa Mohiuddin X, NP as Nurse Practitioner (Internal Medicine) Lavonna Monarch, MD (Inactive) as Consulting Physician (Dermatology)  Extended Emergency Contact Information Primary Emergency Contact: Scipio Mobile Phone: [phone] Relation: Daughter Secondary Emergency Contact: Weeks,Tiffany Mobile Phone: [phone] Relation: Daughter Preferred language: English Interpreter needed? No  Code Status:  DNR Goals of care: Advanced Directive information    01/30/2022   10:19 AM  Advanced Directives  Does Patient Have a Medical Advance Directive? Yes  Type of Paramedic of [place];Living will;Out of facility DNR (pink MOST or yellow form)  Does patient want to make changes to medical advance directive? No - Patient declined  Copy of [place] in Chart? Yes - validated most recent copy scanned in chart (See row information)  Pre-existing out of facility DNR order (yellow form or pink MOST form) Pink MOST/Yellow Form most recent copy in chart - Physician notified to receive inpatient order     Chief Complaint  Patient presents with   Routine    HPI:  Pt is a 89 y.o. female seen today for medical management of chronic diseases.                 R inguinal hernia  w/o obstruction or gangrene-no further c/o abd pain, s/p surgeon consultation             TIA, stable, on Eliquis, Atorvastatin.  CVA, Neurology: cerebral infarction,MRI chronic right cerebellar stroke, mild to moderate generalized atrophy, small vessel disease with multiple chronic lacunar infarcts, possible left sided deficits,  transient vision disturbance of R eye.  OA, left knee s/p TKR, MR lumbar, severe  spinal 11/13/20 spinal and foraminal stenosis with left sciatica, pain in the right shoulder, left shoulder, hip, knee, 02/21/21 for left ankle fracture, in cast, f/u Ortho, pain is controlled on Norco             Constipation, stable, on Senna, MiraLax.              Afib, takes Metoprolol, Eliquis, Amiodarone, f/u cardiology. TSH 1.39 07/03/21             HTN, takes Losartan, Metoprolol.              GERD, stable, takes Omeprazole, Hgb 13.2 01/20/22             CAD, stable, Hx of CABG Cath 04/25/19, prn NTG, takes Atorvastatin. LDL 74 04/05/21             CKD Bun/creat 21/0.7 01/20/22             Insomnia/OSA, not using CPAP,  Lorazepam helps.              Depression, stabilized, off Sertraline 2/2 confusion. Reduced Lexapro to '5mg'$ , added Cymbalta '30mg'$  qd 12/17/21, q8hr Lorazepam and q8hr prn, off Depakote 2/2 to generalized weakness and sleepiness             Vascular cognitive impairment, MMSE 27/30, SNF for supportive care. Vit B12 324 07/03/21, on Vit B12 1047mg qd.              BLE edema, not apparent,  EF 60% 08/03/21, off Furosemide      Past Medical  History:  Diagnosis Date   Atherosclerosis of native coronary artery 2001   LAD & Cx disease -- BMS PCI in 2001; CABG 2002.  PCI in 2006 (per-op [facility])   Atrial fibrillation and flutter ([place]) 04/2019   New diagnosis-major complaint was chest tightness and dyspnea.   Breast cancer ([place])    Cervix dysplasia    GERD (gastroesophageal reflux disease)    Heart attack ([place]) 2002, 2006   Both events reportedly were perioperatively. She has no recollection. Initial PCI was related to angina symptoms (bilateral arm pain)   High cholesterol    Hypertension    Lumbosacral spinal stenosis    Osteoarthritis    Skin cancer    Sleep apnea    Uses CPAP faithfully   Squamous cell carcinoma of skin 02/12/2020   in situ- left neck-anterior (CX35FU)   Past Surgical History:  Procedure Laterality Date   ABDOMINAL HYSTERECTOMY     bone density     CARDIAC  CATHETERIZATION  2001    Dr. Glade Lloyd [facility]) 2 vessel CAD involving LAD-D1 and distal Cx-OM. Appearance of dilated tapered left main with no significant atherosclerosis on IVUS --  BMS PCI of pLAD ([facility] BMS 3.0 mm x 15 mm) & with PTCA of ostial D1, PTCA of dLAD.  BMS PCI pCx [facility] BMS 3.5 mm x 11 mm) - unable to reopen dCx-OM (probable distal Atheroembolic occlusion.  Normal LVEF.   COLONOSCOPY     CORONARY ARTERY BYPASS GRAFT  2002   Unsure of details [facility], in [employer] [employer])   [place]  2001   ([place]) [place] BMS PCI pLAD (3.0 mm x 15 mm) -PTCA of jailed D1 as well as distal LAD; pCx ([facility] BMS 3.5 mm x 11 mm) -unable to open distalCx-OM -thromboembolic occlusion   DIAGNOSTIC MAMMOGRAM     LEFT HEART CATH AND CORS/GRAFTS ANGIOGRAPHY N/A 04/25/2019   Procedure: LEFT HEART CATH AND CORS/GRAFTS ANGIOGRAPHY;  Surgeon: Troy Sine, MD;  Location: [facility] INVASIVE CV LAB;;; ost-prox LCx stent @ OM1 ostium =100% CTO, Ost-prox LAD stent ~30% ISR, Ost D1 CTO, prox RCA ~30%. SCG-OM2 patent with ostial stent 30%, LIMA-LAD patent; CTO of SVG-D1.    left knee replacement     MASTECTOMY     NM MYOVIEW LTD  12/2014; 2018   Cardiologist (Dr. Kellie Simmering.  [place], [place] ph# [phone]) -->LEXISCAN: Nonischemic, "normal "   pap smear     tonsilectomy     TRANSTHORACIC ECHOCARDIOGRAM  12/2014   Echocardiogram 12/25/14- normal LVEF and systolic function, EF 10-25%, mild grade 1 diastolic dysfunction   TRANSTHORACIC ECHOCARDIOGRAM  04/24/2019   EF 65 to 70%.  Moderate septal LVH.  GR 1 DD-elevated EDP.  Normal PAP.  Relatively normal valves.  No aortic stenosis.    Allergies  Allergen Reactions   Codeine Nausea Only    Outpatient Encounter Medications as of 01/30/2022  Medication Sig   amiodarone (PACERONE) 100 MG tablet Take 100 mg by mouth daily.   Ammonium Lactate (AMLACTIN EX) thin layer topically; topical Once A Day - PRN Special Instructions: May apply topically once  daily PRN for dry skin.   apixaban (ELIQUIS) 5 MG TABS tablet Take 1 tablet (5 mg total) by mouth 2 (two) times daily.   atorvastatin (LIPITOR) 80 MG tablet Take 1 tablet (80 mg total) by mouth daily.   Cholecalciferol (VITAMIN D3) 50 MCG (2000 UT) TABS Take 2,000 Units by mouth daily after breakfast.   docusate sodium (COLACE)  100 MG capsule Take 1 capsule (100 mg total) by mouth daily.   DULoxetine (CYMBALTA) 60 MG capsule Take 60 mg by mouth daily.   escitalopram (LEXAPRO) 5 MG tablet Take 1 tablet (5 mg total) by mouth daily.   fexofenadine (ALLEGRA) 180 MG tablet Take 1 tablet (180 mg total) by mouth daily as needed for allergies or rhinitis.   fluticasone (FLONASE) 50 MCG/ACT nasal spray Place 1 spray into both nostrils daily as needed for allergies or rhinitis.   HYDROcodone-acetaminophen (NORCO/VICODIN) 5-325 MG tablet Take 1 tablet by mouth every 6 (six) hours.   lidocaine (SALONPAS PAIN RELIEVING) 4 % Place 1 patch onto the skin daily.   LORazepam (ATIVAN) 0.5 MG tablet Take 1 tablet (0.5 mg total) by mouth every 8 (eight) hours.   losartan (COZAAR) 25 MG tablet Take 25 mg by mouth daily.   Magnesium Hydroxide (MILK OF MAGNESIA PO) Take by mouth. 1200 MG/15ML (Magnesium Hydroxide) Give 30 ml by mouth every 24 hours as needed for constipation May have one dose in 24hrs for symptoms of constipation   melatonin 5 MG TABS Take 5 mg by mouth at bedtime.   metoprolol succinate (TOPROL-XL) 50 MG 24 hr tablet Take 1 tablet (50 mg total) by mouth daily. Take with or immediately following a meal.   nitroGLYCERIN (NITROSTAT) 0.4 MG SL tablet Place 0.4 mg under the tongue as needed for chest pain (Every 5 minutes x 3 doses).   nystatin (MYCOSTATIN/NYSTOP) powder Apply 1 Application topically 2 (two) times daily.   omeprazole (PRILOSEC OTC) 20 MG tablet Take 1 tablet (20 mg total) by mouth daily as needed.   polyethylene glycol (MIRALAX / GLYCOLAX) 17 g packet Take 17 g by mouth daily. Give with 4 oz  of water.   senna-docusate (SENOKOT-S) 8.6-50 MG tablet Take 2 tablets by mouth at bedtime.   vitamin B-12 (CYANOCOBALAMIN) 1000 MCG tablet Take 1,000 mcg by mouth daily.   zinc oxide 20 % ointment Apply 1 application topically 3 (three) times daily as needed for irritation. Apply to buttocks after every incontinent episode and as needed for redness.   DULoxetine (CYMBALTA) 30 MG capsule Take 1 capsule (30 mg total) by mouth daily. (Patient not taking: Reported on 01/30/2022)   No facility-administered encounter medications on file as of 01/30/2022.    Review of Systems  Constitutional:  Negative for appetite change, fatigue and fever.  HENT:  Negative for congestion, hearing loss and voice change.   Eyes:  Negative for visual disturbance.       C/o eye sight issue upon first awake in am, mostly R eye, better as day goes on.   Respiratory:  Negative for cough, shortness of breath and wheezing.   Cardiovascular:  Negative for leg swelling.  Gastrointestinal:  Negative for abdominal pain and constipation.  Genitourinary:  Negative for dysuria and urgency.  Musculoskeletal:  Positive for arthralgias, back pain and gait problem.       S/p L TKR,  left hip, left knee, R+L shoulder pain. Left lower leg cast  Skin:  Positive for wound. Negative for color change.          Neurological:  Positive for numbness. Negative for speech difficulty and headaches.       Memory lapses. C/o numbness right side of tongue.   Hematological:  Does not bruise/bleed easily.  Psychiatric/Behavioral:  Positive for agitation and sleep disturbance. Negative for behavioral problems and dysphoric mood. The patient is nervous/anxious.  Better    Immunization History  Administered Date(s) Administered   Fluad Quad(high Dose 65+) 10/28/2020   Influenza, High Dose Seasonal PF 10/21/2016, 09/24/2017, 10/25/2019, 11/05/2021   Influenza-Unspecified 10/03/2011, 10/04/2012, 01/13/2016, 11/05/2021   Moderna SARS-COV2  Booster Vaccination 11/21/2019, 06/11/2020, 05/30/2021   Moderna Sars-Covid-2 Vaccination 01/16/2019, 02/13/2019, 03/13/2019   PNEUMOCOCCAL CONJUGATE-20 07/03/2021   Pneumococcal-Unspecified 01/13/2016   Tetanus 12/01/2017   Zoster Recombinat (Shingrix) 08/24/2018, 02/22/2020   Pertinent  Health Maintenance Due  Topic Date Due   HEMOGLOBIN A1C  12/01/2019   OPHTHALMOLOGY EXAM  05/30/2021   FOOT EXAM  10/17/2021   INFLUENZA VACCINE  Completed   DEXA SCAN  Completed      02/07/2021   12:47 PM 06/19/2021   12:11 AM 09/11/2021    3:39 PM 12/04/2021    4:51 AM 12/17/2021    4:07 PM  Fall Risk  Falls in the past year? 1  0  1  Was there an injury with Fall? 0  0  1  Fall Risk Category Calculator 1  0  3  Fall Risk Category (Retired) Low  Low  High  (RETIRED) Patient Fall Risk Level Moderate fall risk Moderate fall risk Low fall risk High fall risk High fall risk  Patient at Risk for Falls Due to History of fall(s);Impaired balance/gait;Impaired mobility;Orthopedic patient  No Fall Risks  History of fall(s)  Patient at Risk for Falls Due to - Comments spinal stenosis of lumbar      Fall risk Follow up Falls evaluation completed;Education provided;Falls prevention discussed  Falls evaluation completed  Falls evaluation completed   Functional Status Survey:    Vitals:   01/30/22 1018  BP: 124/72  Pulse: 68  Resp: 18  Temp: (!) 97.4 F (36.3 C)  SpO2: 95%  Weight: 174 lb 14.4 oz (79.3 kg)  Height: '5\' 8"'$  (1.727 m)   Body mass index is 26.59 kg/m. Physical Exam Constitutional:      Appearance: Normal appearance.  HENT:     Head: Normocephalic and atraumatic.     Nose: Nose normal.     Mouth/Throat:     Mouth: Mucous membranes are moist.  Eyes:     Extraocular Movements: Extraocular movements intact.     Pupils: Pupils are equal, round, and reactive to light.  Cardiovascular:     Rate and Rhythm: Normal rate and regular rhythm.     Heart sounds: Murmur heard.  Pulmonary:      Effort: Pulmonary effort is normal.     Breath sounds: Rales present.     Comments: Bibasilar rales present Abdominal:     General: Bowel sounds are normal.     Palpations: Abdomen is soft.     Tenderness: There is no abdominal tenderness.     Hernia: A hernia is present.     Comments: R inguinal hernia per CT abd.   Musculoskeletal:     Cervical back: Normal range of motion and neck supple.     Right lower leg: No edema.     Left lower leg: Edema present.     Comments: Trace edema left foot.  Slightly R arm weakness. persisted right shoulder/arm aches. No decreased ROM. Chronic pain in the left hip, knee, and aches in the left shoulder-no redness, swelling, deformity, or decreased ROM-except preexisting limited over head ROM. Pain in the left lower leg and ankle-in cast. Able to wiggle left toes.   Skin:    General: Skin is warm and dry.  Comments: S/p right mastectomy.  Lateral right lower leg skin tear, covered in dressing, no active bleeding or s/s of infection.   Neurological:     General: No focal deficit present.     Mental Status: She is alert and oriented to person, place, and time. Mental status is at baseline.     Motor: Weakness present.     Coordination: Coordination normal.     Gait: Gait abnormal.     Comments: C/o numbness of the right side of tongue. Saw 2 fingers in front of her upon my examination left eye.   Psychiatric:        Mood and Affect: Mood normal.        Behavior: Behavior normal.        Thought Content: Thought content normal.     Labs reviewed: Recent Labs    06/19/21 0006 06/19/21 0010 08/26/21 0000 10/21/21 0630 12/04/21 0641 01/20/22 0000  NA 140 141   < > 140 138 139  K 4.1 4.0   < > 4.3 4.2 4.3  CL 104 103   < > 102 105 103  CO2 28  --    < > 30* 25 27*  GLUCOSE 108* 106*  --   --  112*  --   BUN 14 17   < > '13 15 21  '$ CREATININE 0.85 0.80   < > 0.8 0.84 0.7  CALCIUM 9.8  --    < > 10.0 9.7 9.3  MG  --   --   --   --  2.1   --    < > = values in this interval not displayed.   Recent Labs    03/26/21 0000 12/04/21 0641 01/20/22 0000  AST '15 20 19  '$ ALT '12 16 14  '$ ALKPHOS 47 54 90  BILITOT  --  0.9  --   PROT  --  7.4  --   ALBUMIN 3.8 3.6 3.4*   Recent Labs    06/19/21 0006 06/19/21 0010 12/04/21 0641 01/20/22 0000  WBC 7.2  --  8.7 7.3  NEUTROABS 3.5  --  5.3 4,000.00  HGB 13.2 13.9 12.9 13.2  HCT 41.8 41.0 39.4 38  MCV 105.0*  --  102.1*  --   PLT 216  --  249 231   Lab Results  Component Value Date   TSH 1.390 07/03/2021   Lab Results  Component Value Date   HGBA1C 5.9 (H) 05/31/2019   Lab Results  Component Value Date   CHOL 175 07/03/2021   HDL 59 07/03/2021   LDLCALC 91 07/03/2021   TRIG 146 07/03/2021   CHOLHDL 2.4 04/05/2020    Significant Diagnostic Results in last 30 days:  No results found.  Assessment/Plan Essential hypertension Blood pressure is controlled, takes Losartan, Metoprolol.   Gastroesophageal reflux disease stable, takes Omeprazole, Hgb 13.2 01/20/22  Coronary artery disease involving native coronary artery of native heart with angina pectoris (HCC) stable, Hx of CABG Cath 04/25/19, prn NTG, takes Atorvastatin. LDL 74 04/05/21  CKD (chronic kidney disease) stage 3, GFR 30-59 ml/min (HCC) Bun/creat 21/0.7 01/20/22  Insomnia secondary to depression with anxiety not using CPAP,  Lorazepam helps sleep at night             Depression, stabilized, off Sertraline 2/2 confusion. Reduced Lexapro to '5mg'$ , added Cymbalta '30mg'$  qd 12/17/21, q8hr Lorazepam and q8hr prn, off Depakote 2/2 to generalized weakness and sleepiness  Senile dementia ([place])  MMSE 27/30, SNF for  supportive care. Vit B12 324 07/03/21, on Vit B12 1043mg qd.   Edema  not apparent,  EF 60% 08/03/21, off Furosemide  Paroxysmal atrial fibrillation/flutter (HCC) Heart rate is in control,  takes Metoprolol, Eliquis, Amiodarone, f/u cardiology. TSH 1.39 07/03/21  Constipation stable, on Senna,  MiraLax.   Osteoarthritis of multiple joints  left knee s/p TKR, MR lumbar, severe spinal 11/13/20 spinal and foraminal stenosis with left sciatica, pain in the right shoulder, left shoulder, hip, knee, 02/21/21 for left ankle fracture, in cast, f/u Ortho, pain is controlled on Norco  CVA (cerebral vascular accident) [place] TIA, stable, on Eliquis, Atorvastatin.  CVA, Neurology: cerebral infarction,MRI chronic right cerebellar stroke, mild to moderate generalized atrophy, small vessel disease with multiple chronic lacunar infarcts, possible left sided deficits,  transient vision disturbance of R eye.   Right inguinal hernia R inguinal hernia  w/o obstruction or gangrene-no further c/o abd pain, s/p surgeon consultation      Family/ staff Communication: plan of care reviewed with the patient and charge nurse.   Labs/tests ordered:  none  Time spend 35 minutes.

## 2022-01-30 NOTE — Assessment & Plan Note (Signed)
TIA, stable, on Eliquis, Atorvastatin.  CVA, Neurology: cerebral infarction,MRI chronic right cerebellar stroke, mild to moderate generalized atrophy, small vessel disease with multiple chronic lacunar infarcts, possible left sided deficits,  transient vision disturbance of R eye.

## 2022-01-30 NOTE — Assessment & Plan Note (Signed)
left knee s/p TKR, MR lumbar, severe spinal 11/13/20 spinal and foraminal stenosis with left sciatica, pain in the right shoulder, left shoulder, hip, knee, 02/21/21 for left ankle fracture, in cast, f/u Ortho, pain is controlled on Norco

## 2022-01-30 NOTE — Assessment & Plan Note (Signed)
Bun/creat 21/0.7 01/20/22

## 2022-01-30 NOTE — Assessment & Plan Note (Signed)
MMSE 27/30, SNF for supportive care. Vit B12 324 07/03/21, on Vit B12 1076mg qd.

## 2022-01-30 NOTE — Assessment & Plan Note (Signed)
R inguinal hernia  w/o obstruction or gangrene-no further c/o abd pain, s/p surgeon consultation

## 2022-01-30 NOTE — Assessment & Plan Note (Signed)
Blood pressure is controlled, takes Losartan, Metoprolol.

## 2022-01-30 NOTE — Assessment & Plan Note (Signed)
stable, Hx of CABG Cath 04/25/19, prn NTG, takes Atorvastatin. LDL 74 04/05/21

## 2022-01-30 NOTE — Assessment & Plan Note (Signed)
not using CPAP,  Lorazepam helps sleep at night             Depression, stabilized, off Sertraline 2/2 confusion. Reduced Lexapro to '5mg'$ , added Cymbalta '30mg'$  qd 12/17/21, q8hr Lorazepam and q8hr prn, off Depakote 2/2 to generalized weakness and sleepiness

## 2022-01-30 NOTE — Assessment & Plan Note (Signed)
stable, on Senna, MiraLax.

## 2022-01-30 NOTE — Assessment & Plan Note (Signed)
not apparent,  EF 60% 08/03/21, off Furosemide

## 2022-01-30 NOTE — Assessment & Plan Note (Signed)
Heart rate is in control,  takes Metoprolol, Eliquis, Amiodarone, f/u cardiology. TSH 1.39 07/03/21

## 2022-01-30 NOTE — Assessment & Plan Note (Signed)
stable, takes Omeprazole, Hgb 13.2 01/20/22

## 2022-01-31 DIAGNOSIS — I69351 Hemiplegia and hemiparesis following cerebral infarction affecting right dominant side: Secondary | ICD-10-CM | POA: Diagnosis not present

## 2022-01-31 DIAGNOSIS — I509 Heart failure, unspecified: Secondary | ICD-10-CM | POA: Diagnosis not present

## 2022-01-31 DIAGNOSIS — I69318 Other symptoms and signs involving cognitive functions following cerebral infarction: Secondary | ICD-10-CM | POA: Diagnosis not present

## 2022-01-31 DIAGNOSIS — I130 Hypertensive heart and chronic kidney disease with heart failure and stage 1 through stage 4 chronic kidney disease, or unspecified chronic kidney disease: Secondary | ICD-10-CM | POA: Diagnosis not present

## 2022-01-31 DIAGNOSIS — Z9181 History of falling: Secondary | ICD-10-CM | POA: Diagnosis not present

## 2022-01-31 DIAGNOSIS — I69391 Dysphagia following cerebral infarction: Secondary | ICD-10-CM | POA: Diagnosis not present

## 2022-02-05 DIAGNOSIS — I509 Heart failure, unspecified: Secondary | ICD-10-CM | POA: Diagnosis not present

## 2022-02-05 DIAGNOSIS — I69391 Dysphagia following cerebral infarction: Secondary | ICD-10-CM | POA: Diagnosis not present

## 2022-02-05 DIAGNOSIS — Z9181 History of falling: Secondary | ICD-10-CM | POA: Diagnosis not present

## 2022-02-05 DIAGNOSIS — I130 Hypertensive heart and chronic kidney disease with heart failure and stage 1 through stage 4 chronic kidney disease, or unspecified chronic kidney disease: Secondary | ICD-10-CM | POA: Diagnosis not present

## 2022-02-05 DIAGNOSIS — I69318 Other symptoms and signs involving cognitive functions following cerebral infarction: Secondary | ICD-10-CM | POA: Diagnosis not present

## 2022-02-05 DIAGNOSIS — I69351 Hemiplegia and hemiparesis following cerebral infarction affecting right dominant side: Secondary | ICD-10-CM | POA: Diagnosis not present

## 2022-02-09 ENCOUNTER — Telehealth: Payer: Medicare Other

## 2022-02-11 ENCOUNTER — Other Ambulatory Visit: Payer: Self-pay | Admitting: Adult Health

## 2022-02-11 DIAGNOSIS — I130 Hypertensive heart and chronic kidney disease with heart failure and stage 1 through stage 4 chronic kidney disease, or unspecified chronic kidney disease: Secondary | ICD-10-CM | POA: Diagnosis not present

## 2022-02-11 DIAGNOSIS — I69391 Dysphagia following cerebral infarction: Secondary | ICD-10-CM | POA: Diagnosis not present

## 2022-02-11 DIAGNOSIS — I509 Heart failure, unspecified: Secondary | ICD-10-CM | POA: Diagnosis not present

## 2022-02-11 DIAGNOSIS — I69351 Hemiplegia and hemiparesis following cerebral infarction affecting right dominant side: Secondary | ICD-10-CM | POA: Diagnosis not present

## 2022-02-11 DIAGNOSIS — I69318 Other symptoms and signs involving cognitive functions following cerebral infarction: Secondary | ICD-10-CM | POA: Diagnosis not present

## 2022-02-11 DIAGNOSIS — Z9181 History of falling: Secondary | ICD-10-CM | POA: Diagnosis not present

## 2022-02-11 MED ORDER — LORAZEPAM 1 MG PO TABS: 1.0000 mg | ORAL_TABLET | Freq: Three times a day (TID) | ORAL | 0 refills | Status: AC

## 2022-02-11 MED ORDER — HYDROCODONE-ACETAMINOPHEN 5-325 MG PO TABS: 1.0000 | ORAL_TABLET | Freq: Four times a day (QID) | ORAL | 0 refills | Status: AC

## 2022-02-12 ENCOUNTER — Non-Acute Institutional Stay (SKILLED_NURSING_FACILITY): Payer: Medicare Other | Admitting: Nurse Practitioner

## 2022-02-12 ENCOUNTER — Telehealth: Payer: Self-pay

## 2022-02-12 ENCOUNTER — Encounter: Payer: Self-pay | Admitting: Nurse Practitioner

## 2022-02-12 DIAGNOSIS — K219 Gastro-esophageal reflux disease without esophagitis: Secondary | ICD-10-CM | POA: Diagnosis not present

## 2022-02-12 DIAGNOSIS — M1990 Unspecified osteoarthritis, unspecified site: Secondary | ICD-10-CM | POA: Diagnosis not present

## 2022-02-12 DIAGNOSIS — I480 Paroxysmal atrial fibrillation: Secondary | ICD-10-CM

## 2022-02-12 DIAGNOSIS — I25709 Atherosclerosis of coronary artery bypass graft(s), unspecified, with unspecified angina pectoris: Secondary | ICD-10-CM

## 2022-02-12 DIAGNOSIS — I10 Essential (primary) hypertension: Secondary | ICD-10-CM

## 2022-02-12 DIAGNOSIS — Z9181 History of falling: Secondary | ICD-10-CM | POA: Diagnosis not present

## 2022-02-12 DIAGNOSIS — M159 Polyosteoarthritis, unspecified: Secondary | ICD-10-CM | POA: Diagnosis not present

## 2022-02-12 DIAGNOSIS — S82892D Other fracture of left lower leg, subsequent encounter for closed fracture with routine healing: Secondary | ICD-10-CM | POA: Diagnosis not present

## 2022-02-12 DIAGNOSIS — G4733 Obstructive sleep apnea (adult) (pediatric): Secondary | ICD-10-CM | POA: Diagnosis not present

## 2022-02-12 DIAGNOSIS — I130 Hypertensive heart and chronic kidney disease with heart failure and stage 1 through stage 4 chronic kidney disease, or unspecified chronic kidney disease: Secondary | ICD-10-CM | POA: Diagnosis not present

## 2022-02-12 DIAGNOSIS — F418 Other specified anxiety disorders: Secondary | ICD-10-CM | POA: Diagnosis not present

## 2022-02-12 DIAGNOSIS — F419 Anxiety disorder, unspecified: Secondary | ICD-10-CM | POA: Diagnosis not present

## 2022-02-12 DIAGNOSIS — F5105 Insomnia due to other mental disorder: Secondary | ICD-10-CM | POA: Diagnosis not present

## 2022-02-12 DIAGNOSIS — N184 Chronic kidney disease, stage 4 (severe): Secondary | ICD-10-CM | POA: Diagnosis not present

## 2022-02-12 DIAGNOSIS — I69318 Other symptoms and signs involving cognitive functions following cerebral infarction: Secondary | ICD-10-CM | POA: Diagnosis not present

## 2022-02-12 DIAGNOSIS — I25119 Atherosclerotic heart disease of native coronary artery with unspecified angina pectoris: Secondary | ICD-10-CM

## 2022-02-12 DIAGNOSIS — I2510 Atherosclerotic heart disease of native coronary artery without angina pectoris: Secondary | ICD-10-CM | POA: Diagnosis not present

## 2022-02-12 DIAGNOSIS — F32A Depression, unspecified: Secondary | ICD-10-CM | POA: Diagnosis not present

## 2022-02-12 DIAGNOSIS — R609 Edema, unspecified: Secondary | ICD-10-CM

## 2022-02-12 DIAGNOSIS — I69351 Hemiplegia and hemiparesis following cerebral infarction affecting right dominant side: Secondary | ICD-10-CM | POA: Diagnosis not present

## 2022-02-12 DIAGNOSIS — I639 Cerebral infarction, unspecified: Secondary | ICD-10-CM

## 2022-02-12 DIAGNOSIS — E785 Hyperlipidemia, unspecified: Secondary | ICD-10-CM | POA: Diagnosis not present

## 2022-02-12 DIAGNOSIS — G894 Chronic pain syndrome: Secondary | ICD-10-CM | POA: Diagnosis not present

## 2022-02-12 DIAGNOSIS — I4891 Unspecified atrial fibrillation: Secondary | ICD-10-CM | POA: Diagnosis not present

## 2022-02-12 DIAGNOSIS — J302 Other seasonal allergic rhinitis: Secondary | ICD-10-CM | POA: Diagnosis not present

## 2022-02-12 DIAGNOSIS — I69391 Dysphagia following cerebral infarction: Secondary | ICD-10-CM | POA: Diagnosis not present

## 2022-02-12 DIAGNOSIS — I509 Heart failure, unspecified: Secondary | ICD-10-CM | POA: Diagnosis not present

## 2022-02-12 NOTE — Assessment & Plan Note (Signed)
stable, takes Omeprazole, Hgb 13.2 01/20/22

## 2022-02-12 NOTE — Assessment & Plan Note (Signed)
stable, Hx of CABG Cath 04/25/19, prn NTG, takes Atorvastatin. LDL 74 04/05/21

## 2022-02-12 NOTE — Assessment & Plan Note (Signed)
left knee s/p TKR, MR lumbar, severe spinal 11/13/20 spinal and foraminal stenosis with left sciatica, pain in the right shoulder, left shoulder, hip, knee, 02/21/21 for left ankle fracture, in cast, f/u Ortho, pain is controlled on Norco

## 2022-02-12 NOTE — Progress Notes (Signed)
Location:   SNF [place] Room Number: 80 Place of Service:  SNF (31) Provider: Lennie Odor Meenakshi Sazama NP  Jachin Coury X, NP  Patient Care Team: Odilon Cass X, NP as PCP - General (Internal Medicine) Leonie Brookelyn Gaynor, MD as PCP - Cardiology (Cardiology) Aidyn Kellis X, NP as Nurse Practitioner (Internal Medicine) Lavonna Monarch, MD (Inactive) as Consulting Physician (Dermatology)  Extended Emergency Contact Information Primary Emergency Contact: Antelope Mobile Phone: [phone] Relation: Daughter Secondary Emergency Contact: Weeks,Tiffany Mobile Phone: [phone] Relation: Daughter Preferred language: English Interpreter needed? No  Code Status: DNR Goals of care: Advanced Directive information    01/30/2022   10:19 AM  Advanced Directives  Does Patient Have a Medical Advance Directive? Yes  Type of Paramedic of [place];Living will;Out of facility DNR (pink MOST or yellow form)  Does patient want to make changes to medical advance directive? No - Patient declined  Copy of [place] in Chart? Yes - validated most recent copy scanned in chart (See row information)  Pre-existing out of facility DNR order (yellow form or pink MOST form) Pink MOST/Yellow Form most recent copy in chart - Physician notified to receive inpatient order     Chief Complaint  Patient presents with   Medical Management of Chronic Issues    HPI:  Pt is a 89 y.o. female seen today for an acute visit for Nurse reported LLE to be warm to touch with swelling, denied pain, able to wiggle toes, felt touch, Norco for pain-effective, in soft cast for ankle fx, f/u Ortho   R inguinal hernia  w/o obstruction or gangrene-no further c/o abd pain, s/p surgeon consultation             TIA, stable, on Eliquis, Atorvastatin.  CVA, Neurology: cerebral infarction,MRI chronic right cerebellar stroke, mild to moderate generalized atrophy, small vessel disease with multiple  chronic lacunar infarcts, possible left sided deficits,  transient vision disturbance of R eye.  OA, left knee s/p TKR, MR lumbar, severe spinal 11/13/20 spinal and foraminal stenosis with left sciatica, pain in the right shoulder, left shoulder, hip, knee, 02/21/21 for left ankle fracture, in cast, f/u Ortho, pain is controlled on Norco             Constipation, stable, on Senna, MiraLax.              Afib, takes Metoprolol, Eliquis, Amiodarone, f/u cardiology. TSH 1.39 07/03/21             HTN, takes Losartan, Metoprolol.              GERD, stable, takes Omeprazole, Hgb 13.2 01/20/22             CAD, stable, Hx of CABG Cath 04/25/19, prn NTG, takes Atorvastatin. LDL 74 04/05/21             CKD Bun/creat 21/0.7 01/20/22             Insomnia/OSA, not using CPAP,  Lorazepam helps.              Depression, stabilized, off Sertraline 2/2 confusion. Reduced Lexapro to '5mg'$ , added Cymbalta 12/17/21, up to '60mg'$  qd, q8hr Lorazepam and q8hr prn, off Depakote 2/2 to generalized weakness and sleepiness             Vascular cognitive impairment, MMSE 27/30, SNF for supportive care. Vit B12 324 07/03/21, on Vit B12 1012mg qd.  BLE edema, not apparent,  EF 60% 08/03/21, off Furosemide    Past Medical History:  Diagnosis Date   Atherosclerosis of native coronary artery 2001   LAD & Cx disease -- BMS PCI in 2001; CABG 2002.  PCI in 2006 (per-op TAH)   Atrial fibrillation and flutter ([employer]) 04/2019   New diagnosis-major complaint was chest tightness and dyspnea.   Breast cancer ([place])    Cervix dysplasia    GERD (gastroesophageal reflux disease)    Heart attack ([place]) 2002, 2006   Both events reportedly were perioperatively. She has no recollection. Initial PCI was related to angina symptoms (bilateral arm pain)   High cholesterol    Hypertension    Lumbosacral spinal stenosis    Osteoarthritis    Skin cancer    Sleep apnea    Uses CPAP faithfully   Squamous cell carcinoma of skin 02/12/2020   in situ-  left neck-anterior (CX35FU)   Past Surgical History:  Procedure Laterality Date   ABDOMINAL HYSTERECTOMY     bone density     CARDIAC CATHETERIZATION  2001    Dr. Glade Lloyd [facility]) 2 vessel CAD involving LAD-D1 and distal Cx-OM. Appearance of dilated tapered left main with no significant atherosclerosis on IVUS --  BMS PCI of pLAD ([place] BMS 3.0 mm x 15 mm) & with PTCA of ostial D1, PTCA of dLAD.  BMS PCI pCx [facility] BMS 3.5 mm x 11 mm) - unable to reopen dCx-OM (probable distal Atheroembolic occlusion.  Normal LVEF.   COLONOSCOPY     CORONARY ARTERY BYPASS GRAFT  2002   Unsure of details [facility])   [facility]  2001   ([place]) [place] BMS PCI pLAD (3.0 mm x 15 mm) -PTCA of jailed D1 as well as distal LAD; pCx ([place] BMS 3.5 mm x 11 mm) -unable to open distalCx-OM -thromboembolic occlusion   DIAGNOSTIC MAMMOGRAM     LEFT HEART CATH AND CORS/GRAFTS ANGIOGRAPHY N/A 04/25/2019   Procedure: LEFT HEART CATH AND CORS/GRAFTS ANGIOGRAPHY;  Surgeon: Troy Sine, MD;  Location: [facility] INVASIVE CV LAB;;; ost-prox LCx stent @ OM1 ostium =100% CTO, Ost-prox LAD stent ~30% ISR, Ost D1 CTO, prox RCA ~30%. SCG-OM2 patent with ostial stent 30%, LIMA-LAD patent; CTO of SVG-D1.    left knee replacement     MASTECTOMY     NM MYOVIEW LTD  12/2014; 2018   Cardiologist (Dr. Kellie Simmering.  [place], [place] ph# [phone]) -->LEXISCAN: Nonischemic, "normal "   pap smear     tonsilectomy     TRANSTHORACIC ECHOCARDIOGRAM  12/2014   Echocardiogram 12/25/14- normal LVEF and systolic function, EF 67-34%, mild grade 1 diastolic dysfunction   TRANSTHORACIC ECHOCARDIOGRAM  04/24/2019   EF 65 to 70%.  Moderate septal LVH.  GR 1 DD-elevated EDP.  Normal PAP.  Relatively normal valves.  No aortic stenosis.    Allergies  Allergen Reactions   Codeine Nausea Only    Allergies as of 02/12/2022       Reactions   Codeine Nausea Only        Medication List        Accurate as of  February 12, 2022  1:16 PM. If you have any questions, ask your nurse or doctor.          amiodarone 100 MG tablet Commonly known as: PACERONE Take 100 mg by mouth daily.   AMLACTIN EX thin layer topically; topical Once A Day - PRN Special Instructions: May apply topically  once daily PRN for dry skin.   apixaban 5 MG Tabs tablet Commonly known as: Eliquis Take 1 tablet (5 mg total) by mouth 2 (two) times daily.   atorvastatin 80 MG tablet Commonly known as: LIPITOR Take 1 tablet (80 mg total) by mouth daily.   cyanocobalamin 1000 MCG tablet Commonly known as: VITAMIN B12 Take 1,000 mcg by mouth daily.   docusate sodium 100 MG capsule Commonly known as: COLACE Take 1 capsule (100 mg total) by mouth daily.   DULoxetine 60 MG capsule Commonly known as: CYMBALTA Take 60 mg by mouth daily.   DULoxetine 30 MG capsule Commonly known as: Cymbalta Take 1 capsule (30 mg total) by mouth daily.   escitalopram 5 MG tablet Commonly known as: Lexapro Take 1 tablet (5 mg total) by mouth daily.   fexofenadine 180 MG tablet Commonly known as: ALLEGRA Take 1 tablet (180 mg total) by mouth daily as needed for allergies or rhinitis.   fluticasone 50 MCG/ACT nasal spray Commonly known as: FLONASE Place 1 spray into both nostrils daily as needed for allergies or rhinitis.   HYDROcodone-acetaminophen 5-325 MG tablet Commonly known as: NORCO/VICODIN Take 1 tablet by mouth every 6 (six) hours.   LORazepam 1 MG tablet Commonly known as: ATIVAN Take 1 tablet (1 mg total) by mouth every 8 (eight) hours.   losartan 25 MG tablet Commonly known as: COZAAR Take 25 mg by mouth daily.   melatonin 5 MG Tabs Take 5 mg by mouth at bedtime.   metoprolol succinate 50 MG 24 hr tablet Commonly known as: TOPROL-XL Take 1 tablet (50 mg total) by mouth daily. Take with or immediately following a meal.   MILK OF MAGNESIA PO Take by mouth. 1200 MG/15ML (Magnesium Hydroxide) Give 30 ml by mouth  every 24 hours as needed for constipation May have one dose in 24hrs for symptoms of constipation   nitroGLYCERIN 0.4 MG SL tablet Commonly known as: NITROSTAT Place 0.4 mg under the tongue as needed for chest pain (Every 5 minutes x 3 doses).   nystatin powder Commonly known as: MYCOSTATIN/NYSTOP Apply 1 Application topically 2 (two) times daily.   omeprazole 20 MG tablet Commonly known as: PRILOSEC OTC Take 1 tablet (20 mg total) by mouth daily as needed.   polyethylene glycol 17 g packet Commonly known as: MIRALAX / GLYCOLAX Take 17 g by mouth daily. Give with 4 oz of water.   Salonpas Pain Relieving 4 % Generic drug: lidocaine Place 1 patch onto the skin daily.   senna-docusate 8.6-50 MG tablet Commonly known as: Senokot-S Take 2 tablets by mouth at bedtime.   Vitamin D3 50 MCG (2000 UT) Tabs Take 2,000 Units by mouth daily after breakfast.   zinc oxide 20 % ointment Apply 1 application topically 3 (three) times daily as needed for irritation. Apply to buttocks after every incontinent episode and as needed for redness.        Review of Systems  Constitutional:  Negative for appetite change, fatigue and fever.  HENT:  Negative for congestion, hearing loss and voice change.   Eyes:  Negative for visual disturbance.       C/o eye sight issue upon first awake in am, mostly R eye, better as day goes on.   Respiratory:  Negative for cough, shortness of breath and wheezing.   Cardiovascular:  Negative for leg swelling.  Gastrointestinal:  Negative for abdominal pain and constipation.  Genitourinary:  Negative for dysuria and urgency.  Musculoskeletal:  Positive for  arthralgias, back pain and gait problem.       S/p L TKR,  left hip, left knee, R+L shoulder pain. Left lower leg cast  Skin:  Positive for wound. Negative for color change.          Neurological:  Positive for numbness. Negative for speech difficulty and headaches.       Memory lapses. C/o numbness right  side of tongue.   Hematological:  Does not bruise/bleed easily.  Psychiatric/Behavioral:  Positive for sleep disturbance. Negative for agitation, behavioral problems and dysphoric mood. The patient is not nervous/anxious.        Better    Immunization History  Administered Date(s) Administered   Fluad Quad(high Dose 65+) 10/28/2020   Influenza, High Dose Seasonal PF 10/21/2016, 09/24/2017, 10/25/2019, 11/05/2021   Influenza-Unspecified 10/03/2011, 10/04/2012, 01/13/2016, 11/05/2021   Moderna SARS-COV2 Booster Vaccination 11/21/2019, 06/11/2020, 05/30/2021   Moderna Sars-Covid-2 Vaccination 01/16/2019, 02/13/2019, 03/13/2019   PNEUMOCOCCAL CONJUGATE-20 07/03/2021   Pneumococcal-Unspecified 01/13/2016   Tdap 12/02/2017   Tetanus 12/01/2017   Zoster Recombinat (Shingrix) 08/24/2018, 02/22/2020   Pertinent  Health Maintenance Due  Topic Date Due   HEMOGLOBIN A1C  12/01/2019   OPHTHALMOLOGY EXAM  05/30/2021   FOOT EXAM  10/17/2021   INFLUENZA VACCINE  Completed   DEXA SCAN  Completed      02/07/2021   12:47 PM 06/19/2021   12:11 AM 09/11/2021    3:39 PM 12/04/2021    4:51 AM 12/17/2021    4:07 PM  Fall Risk  Falls in the past year? 1  0  1  Was there an injury with Fall? 0  0  1  Fall Risk Category Calculator 1  0  3  Fall Risk Category (Retired) Low  Low  High  (RETIRED) Patient Fall Risk Level Moderate fall risk Moderate fall risk Low fall risk High fall risk High fall risk  Patient at Risk for Falls Due to History of fall(s);Impaired balance/gait;Impaired mobility;Orthopedic patient  No Fall Risks  History of fall(s)  Patient at Risk for Falls Due to - Comments spinal stenosis of lumbar      Fall risk Follow up Falls evaluation completed;Education provided;Falls prevention discussed  Falls evaluation completed  Falls evaluation completed   Functional Status Survey:    Vitals:   02/12/22 1244  BP: 133/78  Pulse: 73  Resp: 18  Temp: 97.7 F (36.5 C)  SpO2: 92%   There  is no height or weight on file to calculate BMI. Physical Exam Constitutional:      Appearance: Normal appearance.  HENT:     Head: Normocephalic and atraumatic.     Nose: Nose normal.     Mouth/Throat:     Mouth: Mucous membranes are moist.  Eyes:     Extraocular Movements: Extraocular movements intact.     Pupils: Pupils are equal, round, and reactive to light.  Cardiovascular:     Rate and Rhythm: Normal rate and regular rhythm.     Heart sounds: Murmur heard.  Pulmonary:     Effort: Pulmonary effort is normal.     Breath sounds: Rales present.     Comments: Bibasilar rales present Abdominal:     General: Bowel sounds are normal.     Palpations: Abdomen is soft.     Tenderness: There is no abdominal tenderness.     Hernia: A hernia is present.     Comments: R inguinal hernia per CT abd.   Musculoskeletal:     Cervical back:  Normal range of motion and neck supple.     Right lower leg: No edema.     Left lower leg: No edema.     Comments: Trace edema left toes, able to wiggle toes, felt touch. Slightly R arm weakness. persisted right shoulder/arm aches. No decreased ROM. Chronic pain in the left hip, knee, and aches in the left shoulder-no redness, swelling, deformity, or decreased ROM-except preexisting limited over head ROM. The left lower leg and ankle-in cast. Able to wiggle left toes. No warmth, redness seen above the soft cast.   Skin:    General: Skin is warm and dry.     Comments: S/p right mastectomy.  Lateral right lower leg skin tear, covered in dressing, no active bleeding or s/s of infection.   Neurological:     General: No focal deficit present.     Mental Status: She is alert and oriented to person, place, and time. Mental status is at baseline.     Motor: Weakness present.     Coordination: Coordination normal.     Gait: Gait abnormal.     Comments: C/o numbness of the right side of tongue. Saw 2 fingers in front of her upon my examination left eye.    Psychiatric:        Mood and Affect: Mood normal.        Behavior: Behavior normal.        Thought Content: Thought content normal.     Labs reviewed: Recent Labs    06/19/21 0006 06/19/21 0010 08/26/21 0000 10/21/21 0630 12/04/21 0641 01/20/22 0000  NA 140 141   < > 140 138 139  K 4.1 4.0   < > 4.3 4.2 4.3  CL 104 103   < > 102 105 103  CO2 28  --    < > 30* 25 27*  GLUCOSE 108* 106*  --   --  112*  --   BUN 14 17   < > '13 15 21  '$ CREATININE 0.85 0.80   < > 0.8 0.84 0.7  CALCIUM 9.8  --    < > 10.0 9.7 9.3  MG  --   --   --   --  2.1  --    < > = values in this interval not displayed.   Recent Labs    03/26/21 0000 12/04/21 0641 01/20/22 0000  AST '15 20 19  '$ ALT '12 16 14  '$ ALKPHOS 47 54 90  BILITOT  --  0.9  --   PROT  --  7.4  --   ALBUMIN 3.8 3.6 3.4*   Recent Labs    06/19/21 0006 06/19/21 0010 12/04/21 0641 01/20/22 0000  WBC 7.2  --  8.7 7.3  NEUTROABS 3.5  --  5.3 4,000.00  HGB 13.2 13.9 12.9 13.2  HCT 41.8 41.0 39.4 38  MCV 105.0*  --  102.1*  --   PLT 216  --  249 231   Lab Results  Component Value Date   TSH 1.390 07/03/2021   Lab Results  Component Value Date   HGBA1C 5.9 (H) 05/31/2019   Lab Results  Component Value Date   CHOL 175 07/03/2021   HDL 59 07/03/2021   LDLCALC 91 07/03/2021   TRIG 146 07/03/2021   CHOLHDL 2.4 04/05/2020    Significant Diagnostic Results in last 30 days:  No results found.  Assessment/Plan: Closed left ankle fracture Nurse reported LLE to be warm to touch with swelling, denied pain,  able to wiggle toes, felt touch, Norco for pain-effective, in soft cast for ankle fx, f/u Ortho No redness, warmth, swelling, or pain LLE from the top of case, able to wiggle toes.   CVA (cerebral vascular accident) [facility]) Neurology: cerebral infarction,MRI chronic right cerebellar stroke, mild to moderate generalized atrophy, small vessel disease with multiple chronic lacunar infarcts, possible left sided deficits,   transient vision disturbance of R eye.   Osteoarthritis of multiple joints left knee s/p TKR, MR lumbar, severe spinal 11/13/20 spinal and foraminal stenosis with left sciatica, pain in the right shoulder, left shoulder, hip, knee, 02/21/21 for left ankle fracture, in cast, f/u Ortho, pain is controlled on Norco  Paroxysmal atrial fibrillation/flutter ([place]) Heart rate is in control, takes Metoprolol, Eliquis, Amiodarone, f/u cardiology. TSH 1.39 07/03/21  Essential hypertension Blood pressure is controlled, takes Losartan, Metoprolol.   Gastroesophageal reflux disease stable, takes Omeprazole, Hgb 13.2 01/20/22  Coronary artery disease involving coronary bypass graft of native heart with angina pectoris (HCC)  stable, Hx of CABG Cath 04/25/19, prn NTG, takes Atorvastatin. LDL 74 04/05/21  Insomnia secondary to depression with anxiety stabilized, off Sertraline 2/2 confusion. Reduced Lexapro to '5mg'$ , added Cymbalta 12/17/21, up to '60mg'$  qd, q8hr Lorazepam and q8hr prn, off Depakote 2/2 to generalized weakness and sleepiness  Edema  not apparent,  EF 60% 08/03/21, off Furosemide    Family/ staff Communication: plan of care reviewed with the patient and charge nurse.   Labs/tests ordered:  none  Time spend 35 minutes.

## 2022-02-12 NOTE — Assessment & Plan Note (Signed)
stabilized, off Sertraline 2/2 confusion. Reduced Lexapro to '5mg'$ , added Cymbalta 12/17/21, up to '60mg'$  qd, q8hr Lorazepam and q8hr prn, off Depakote 2/2 to generalized weakness and sleepiness

## 2022-02-12 NOTE — Assessment & Plan Note (Signed)
Blood pressure is controlled, takes Losartan, Metoprolol.

## 2022-02-12 NOTE — Assessment & Plan Note (Signed)
Neurology: cerebral infarction,MRI chronic right cerebellar stroke, mild to moderate generalized atrophy, small vessel disease with multiple chronic lacunar infarcts, possible left sided deficits,  transient vision disturbance of R eye.

## 2022-02-12 NOTE — Telephone Encounter (Signed)
   Pre-operative Risk Assessment    Patient Name: Stephanie Shea  DOB: 1933/01/24 MRN: [id number]      Request for Surgical Clearance    Procedure:   OPEN TREATMENT LEFT LATERAL MALLEOLUS, SYNDESMOSIS  Date of Surgery:  Clearance TBD                                 Surgeon:  Radene Journey, MD Surgeon's Group or Practice Name:  [facility]  Phone number:  [phone] Fax number:  [phone]    Type of Clearance Requested:   - Medical  - Pharmacy:  Hold Apixaban (Eliquis)     Type of Anesthesia:   CHOICE   Additional requests/questions:    [name]   02/12/2022, 1:59 PM

## 2022-02-12 NOTE — Telephone Encounter (Signed)
Medication was send into pharmacy. 

## 2022-02-12 NOTE — Telephone Encounter (Signed)
   Name: Stephanie Shea  DOB: 20-Oct-1933  MRN: [id number]  Primary Cardiologist: Glenetta Hew, MD  Chart reviewed as part of pre-operative protocol coverage. Because of Stephanie Shea's past medical history and time since last visit, she will require a follow-up in-office visit in order to better assess preoperative cardiovascular risk.  Pre-op covering staff: - Please schedule appointment and call patient to inform them. If patient already had an upcoming appointment within acceptable timeframe, please add "pre-op clearance" to the appointment notes so provider is aware. - Please contact requesting surgeon's office via preferred method (i.e, phone, fax) to inform them of need for appointment prior to surgery.  This message will also be routed to pharmacy pool for input on holding Eliquis as requested below so that this information is available to the clearing provider at time of patient's appointment.   [place], [place]  02/12/2022, 4:47 PM

## 2022-02-12 NOTE — Assessment & Plan Note (Signed)
Heart rate is in control, takes Metoprolol, Eliquis, Amiodarone, f/u cardiology. TSH 1.39 07/03/21

## 2022-02-12 NOTE — Assessment & Plan Note (Signed)
Nurse reported LLE to be warm to touch with swelling, denied pain, able to wiggle toes, felt touch, Norco for pain-effective, in soft cast for ankle fx, f/u Ortho No redness, warmth, swelling, or pain LLE from the top of case, able to wiggle toes.

## 2022-02-12 NOTE — Assessment & Plan Note (Signed)
not apparent,  EF 60% 08/03/21, off Furosemide

## 2022-02-13 DIAGNOSIS — Z9181 History of falling: Secondary | ICD-10-CM | POA: Diagnosis not present

## 2022-02-13 DIAGNOSIS — I69318 Other symptoms and signs involving cognitive functions following cerebral infarction: Secondary | ICD-10-CM | POA: Diagnosis not present

## 2022-02-13 DIAGNOSIS — I69351 Hemiplegia and hemiparesis following cerebral infarction affecting right dominant side: Secondary | ICD-10-CM | POA: Diagnosis not present

## 2022-02-13 DIAGNOSIS — I69391 Dysphagia following cerebral infarction: Secondary | ICD-10-CM | POA: Diagnosis not present

## 2022-02-13 DIAGNOSIS — I130 Hypertensive heart and chronic kidney disease with heart failure and stage 1 through stage 4 chronic kidney disease, or unspecified chronic kidney disease: Secondary | ICD-10-CM | POA: Diagnosis not present

## 2022-02-13 DIAGNOSIS — I509 Heart failure, unspecified: Secondary | ICD-10-CM | POA: Diagnosis not present

## 2022-02-13 NOTE — Telephone Encounter (Signed)
DPR s/w pt's daughter Morey Hummingbird, pt has been scheduled for in office appt with Jory Sims , DNP at [facility] 02/20/22 @ 10:55.

## 2022-02-13 NOTE — Telephone Encounter (Signed)
Patient with diagnosis of afib on Eliquis for anticoagulation.    Procedure: OPEN TREATMENT LEFT LATERAL MALLEOLUS, SYNDESMOSIS  Date of procedure: TBD  CHA2DS2-VASc Score = 8  This indicates a 10.8% annual risk of stroke. The patient's score is based upon: CHF History: 1 HTN History: 1 Diabetes History: 0 Stroke History: 2 Vascular Disease History: 1 Age Score: 2 Gender Score: 1   Has followed with neuro since 06/2021, brain MRI showed chronic right cerebellar stroke and small vessel disease with multiple chronic lacunar infarcts. Previously cleared by Dr Ellyn Hack to hold [place] 3 days for spinal procedure.  CrCl 61m/min using adjusted body weight Platelet count 249K  Pt hasn't been seen since 2022. If no major clinical changes noted at upcoming office visit, ok for pt to hold Eliquis 1-2 days prior to procedure. She should resume as soon as safely possible after given elevated CV risk.  **This guidance is not considered finalized until pre-operative APP has relayed final recommendations.**

## 2022-02-17 DIAGNOSIS — I509 Heart failure, unspecified: Secondary | ICD-10-CM | POA: Diagnosis not present

## 2022-02-17 DIAGNOSIS — I69318 Other symptoms and signs involving cognitive functions following cerebral infarction: Secondary | ICD-10-CM | POA: Diagnosis not present

## 2022-02-17 DIAGNOSIS — I69351 Hemiplegia and hemiparesis following cerebral infarction affecting right dominant side: Secondary | ICD-10-CM | POA: Diagnosis not present

## 2022-02-17 DIAGNOSIS — I69391 Dysphagia following cerebral infarction: Secondary | ICD-10-CM | POA: Diagnosis not present

## 2022-02-17 DIAGNOSIS — I130 Hypertensive heart and chronic kidney disease with heart failure and stage 1 through stage 4 chronic kidney disease, or unspecified chronic kidney disease: Secondary | ICD-10-CM | POA: Diagnosis not present

## 2022-02-17 DIAGNOSIS — Z9181 History of falling: Secondary | ICD-10-CM | POA: Diagnosis not present

## 2022-02-17 NOTE — Progress Notes (Signed)
Cardiology Clinic Note   Patient Name: Stephanie Shea Date of Encounter: 02/20/2022  Primary Care Provider:  Mast, [name], NP Primary Cardiologist:  Glenetta Hew, MD  Patient Profile    89 year old female with hx of CAD, CI of pLAD & PTCA of ostial D1 (jailed), dLAD PTCA.  PCI of pCx, but unable to open dCx-OM.  IVIS of left main? Peri-op MI 2002;  CABG ([place]); has had stent placed in the ostial SVG-OM; 04/25/2019: ost-prox LCx stent @ OM1 ostium at 100% CTO, Ost-prox LAD stent ~30% ISR, Ost D1 CTO, prox RCA ~30%. SCG-OM2 patent with ostial stent 30%, LIMA-LAD patent; CTO of SVG-D1. Newly occluded SVG-diagonal, no invasive options. PAF dx 2021, CHADS VASC Score 8, on amiodarone and Eliquis, HFpEF, HTN, HL, and OSA  She comes today for pre-operative cardiac evaluation for a planned open treatment left lateal malleolus, syndesmosis on date to be announced, with Dr. Radene Journey, [place].   Okay to hold Eliquis 2-3 days preop for surgeries or procedures.  (2 days from Mohs procedures and 3 days for high risk. (Per Dr. Ellyn Hack)    Past Medical History    Past Medical History:  Diagnosis Date   Atherosclerosis of native coronary artery 2001   LAD & Cx disease -- BMS PCI in 2001; CABG 2002.  PCI in 2006 (per-op [facility])   Atrial fibrillation and flutter ([place]) 04/2019   New diagnosis-major complaint was chest tightness and dyspnea.   Breast cancer ([place])    Cervix dysplasia    GERD (gastroesophageal reflux disease)    Heart attack ([place]) 2002, 2006   Both events reportedly were perioperatively. She has no recollection. Initial PCI was related to angina symptoms (bilateral arm pain)   High cholesterol    Hypertension    Lumbosacral spinal stenosis    Osteoarthritis    Skin cancer    Sleep apnea    Uses CPAP faithfully   Squamous cell carcinoma of skin 02/12/2020   in situ- left neck-anterior (CX35FU)   Past Surgical History:  Procedure Laterality Date    ABDOMINAL HYSTERECTOMY     bone density     CARDIAC CATHETERIZATION  2001    Dr. Glade Lloyd [facility]) 2 vessel CAD involving LAD-D1 and distal Cx-OM. Appearance of dilated tapered left main with no significant atherosclerosis on IVUS --  BMS PCI of pLAD ([facility] BMS 3.0 mm x 15 mm) & with PTCA of ostial D1, PTCA of dLAD.  BMS PCI pCx [facility] BMS 3.5 mm x 11 mm) - unable to reopen dCx-OM (probable distal Atheroembolic occlusion.  Normal LVEF.   COLONOSCOPY     CORONARY ARTERY BYPASS GRAFT  2002   Unsure of details [facility], in [facility])   [place]  2001   ([place]) [place] BMS PCI pLAD (3.0 mm x 15 mm) -PTCA of jailed D1 as well as distal LAD; pCx ([facility] BMS 3.5 mm x 11 mm) -unable to open distalCx-OM -thromboembolic occlusion   DIAGNOSTIC MAMMOGRAM     LEFT HEART CATH AND CORS/GRAFTS ANGIOGRAPHY N/A 04/25/2019   Procedure: LEFT HEART CATH AND CORS/GRAFTS ANGIOGRAPHY;  Surgeon: Troy Sine, MD;  Location: [facility];;; ost-prox LCx stent @ OM1 ostium =100% CTO, Ost-prox LAD stent ~30% ISR, Ost D1 CTO, prox RCA ~30%. SCG-OM2 patent with ostial stent 30%, LIMA-LAD patent; CTO of SVG-D1.    left knee replacement     MASTECTOMY     NM  MYOVIEW LTD  12/2014; 2018   Cardiologist (Dr. Kellie Simmering.  [place], [place] ph# [phone]) -->LEXISCAN: Nonischemic, "normal "   pap smear     tonsilectomy     TRANSTHORACIC ECHOCARDIOGRAM  12/2014   Echocardiogram 12/25/14- normal LVEF and systolic function, EF 99991111, mild grade 1 diastolic dysfunction   TRANSTHORACIC ECHOCARDIOGRAM  04/24/2019   EF 65 to 70%.  Moderate septal LVH.  GR 1 DD-elevated EDP.  Normal PAP.  Relatively normal valves.  No aortic stenosis.    Allergies  Allergies  Allergen Reactions   Codeine Nausea Only    History of Present Illness    Stephanie Shea is a very pleasant lady sitting in a wheelchair, who presents also with her daughter in attendance for preoperative evaluation as described above.   The patient was being helped from her wheelchair to the bed and her aide was unable to hold her long enough allowing her to slowly come down to the floor.  In doing so, she twisted her left ankle and foot and had a fracture.  Stephanie Shea is wheelchair-bound as result of back issues and musculoskeletal disabilities.  She also has some right-sided hemiparesis and her tongue and right arm which only allows her to have pured foods to prevent aspiration.  She is retired Copywriter, advertising, was normally very active until she had to degenerative changes.  Her medications are being given to her via the skilled nursing facility, and put in applesauce to aid in swallowing.  The patient is appetite has been adequate.  She denies any bleeding, hemoptysis or hematic emesis on Eliquis.  Home Medications    Current Outpatient Medications  Medication Sig Dispense Refill   amiodarone (PACERONE) 100 MG tablet Take 100 mg by mouth daily.     Ammonium Lactate (AMLACTIN EX) thin layer topically; topical Once A Day - PRN Special Instructions: May apply topically once daily PRN for dry skin.     apixaban (ELIQUIS) 5 MG TABS tablet Take 1 tablet (5 mg total) by mouth 2 (two) times daily. 30 tablet 0   atorvastatin (LIPITOR) 80 MG tablet Take 1 tablet (80 mg total) by mouth daily. 30 tablet 0   Cholecalciferol (VITAMIN D3) 50 MCG (2000 UT) TABS Take 2,000 Units by mouth daily after breakfast. 30 tablet 0   docusate sodium (COLACE) 100 MG capsule Take 1 capsule (100 mg total) by mouth daily. 30 capsule 0   DULoxetine (CYMBALTA) 60 MG capsule Take 60 mg by mouth daily.     escitalopram (LEXAPRO) 5 MG tablet Take 1 tablet (5 mg total) by mouth daily. 30 tablet 3   fexofenadine (ALLEGRA) 180 MG tablet Take 1 tablet (180 mg total) by mouth daily as needed for allergies or rhinitis. 30 tablet 0   fluticasone (FLONASE) 50 MCG/ACT nasal spray Place 1 spray into both nostrils daily as needed for allergies or rhinitis. 30 mL 0    HYDROcodone-acetaminophen (NORCO/VICODIN) 5-325 MG tablet Take 1 tablet by mouth every 6 (six) hours. 60 tablet 0   lidocaine (SALONPAS PAIN RELIEVING) 4 % Place 1 patch onto the skin daily.     LORazepam (ATIVAN) 1 MG tablet Take 1 tablet (1 mg total) by mouth every 8 (eight) hours. 45 tablet 0   losartan (COZAAR) 25 MG tablet Take 25 mg by mouth daily.     Magnesium Hydroxide (MILK OF MAGNESIA PO) Take by mouth. 1200 MG/15ML (Magnesium Hydroxide) Give 30 ml by mouth every 24 hours as needed for constipation May have  one dose in 24hrs for symptoms of constipation     melatonin 5 MG TABS Take 5 mg by mouth at bedtime.     metoprolol succinate (TOPROL-XL) 50 MG 24 hr tablet Take 1 tablet (50 mg total) by mouth daily. Take with or immediately following a meal. 30 tablet 0   nitroGLYCERIN (NITROSTAT) 0.4 MG SL tablet Place 0.4 mg under the tongue as needed for chest pain (Every 5 minutes x 3 doses).     nystatin (MYCOSTATIN/NYSTOP) powder Apply 1 Application topically 2 (two) times daily.     omeprazole (PRILOSEC OTC) 20 MG tablet Take 1 tablet (20 mg total) by mouth daily as needed. 30 tablet 0   omeprazole (PRILOSEC) 20 MG capsule Take 20 mg by mouth daily.     polyethylene glycol (MIRALAX / GLYCOLAX) 17 g packet Take 17 g by mouth daily. Give with 4 oz of water. 30 each 0   senna-docusate (SENOKOT-S) 8.6-50 MG tablet Take 2 tablets by mouth at bedtime.     vitamin B-12 (CYANOCOBALAMIN) 1000 MCG tablet Take 1,000 mcg by mouth daily.     zinc oxide 20 % ointment Apply 1 application topically 3 (three) times daily as needed for irritation. Apply to buttocks after every incontinent episode and as needed for redness. 30 g 0   No current facility-administered medications for this visit.     Family History    Family History  Problem Relation Age of Onset   Stroke Mother    Heart disease Father    She indicated that her mother is deceased. She indicated that her father is deceased. She indicated  that her brother is alive. She indicated that her daughter is alive. She indicated that her son is alive.  Social History    Social History   Socioeconomic History   Marital status: Widowed    Spouse name: Not on file   Number of children: Not on file   Years of education: Not on file   Highest education level: Not on file  Occupational History   Not on file  Tobacco Use   Smoking status: Never   Smokeless tobacco: Never  Vaping Use   Vaping Use: Never used  Substance and Sexual Activity   Alcohol use: No   Drug use: No   Sexual activity: Not Currently  Other Topics Concern   Not on file  Social History Narrative   Recently moved back to [place] (lives alone in a [place] apartment at [facility])   Do you drink/eat things with caffeine? 2 cups of coffee every day   What year were you married? [place] - twice widowed   No pets.      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 Has POA      Johnny Bridge (Daughter) [phone])      Primary Emergency Contact: [place],JOHN L   Address: [place],  [place] [address]   Home Phone: [phone]   Social Determinants of Health   Financial Resource Strain: Low Risk  (02/07/2021)   Overall Financial Resource Strain (CARDIA)    Difficulty of Paying Living Expenses: Not hard at all  Food Insecurity: No Food Insecurity (02/07/2021)   Hunger Vital Sign    Worried About Running Out of Food in the Last Year: Never true    Ran Out  of Food in the Last Year: Never true  Transportation Needs: No Transportation Needs (02/07/2021)   PRAPARE - Hydrologist (Medical): No    Lack of Transportation (Non-Medical): No  Physical Activity: Sufficiently Active (02/07/2021)   Exercise Vital Sign    Days of Exercise per Week: 5 days    Minutes of Exercise per Session: 30 min  Stress: No Stress  Concern Present (02/07/2021)   [place]    Feeling of Stress : Only a little  Social Connections: Moderately Isolated (02/07/2021)   Social Connection and Isolation Panel [NHANES]    Frequency of Communication with Friends and Family: More than three times a week    Frequency of Social Gatherings with Friends and Family: More than three times a week    Attends Religious Services: More than 4 times per year    Active Member of [employer] or Organizations: No    Attends Archivist Meetings: Never    Marital Status: Widowed  Intimate Partner Violence: Not At Risk (02/07/2021)   Humiliation, Afraid, Rape, and Kick questionnaire    Fear of Current or Ex-Partner: No    Emotionally Abused: No    Physically Abused: No    Sexually Abused: No     Review of Systems    General:  No chills, fever, night sweats or weight changes.  Sitting in wheelchair Cardiovascular:  No chest pain, dyspnea on exertion, edema, orthopnea, palpitations, paroxysmal nocturnal dyspnea. Dermatological: No rash, lesions/masses Respiratory: No cough, dyspnea Urologic: No hematuria, dysuria Abdominal:   No nausea, vomiting, diarrhea, bright red blood per rectum, melena, or hematemesis Neurologic:  No visual changes, right sided hemiparesis, no changes in mental status.  Responds appropriately and smiles. All other systems reviewed and are otherwise negative except as noted above.     Physical Exam    VS:  BP 103/63   Pulse 79   Wt 172 lb (78 kg)   SpO2 95%   BMI 26.15 kg/m  , BMI Body mass index is 26.15 kg/m.     GEN: Well nourished, well developed, in no acute distress.  Sitting in a wheelchair HEENT: normal. Neck: Supple, no JVD, carotid bruits, or masses. Cardiac: RRR, 2/6 hello systolic murmur heard best at the left sternal border no rubs, or gallops. No clubbing, cyanosis, edema.  Radials/DP/PT 2+ and equal on the right, unable to  check distal pulses on the left due to Ace wrap and dressing of her ankle and foot Respiratory:  Respirations regular and unlabored, clear to auscultation bilaterally with mild bibasilar crackles, no wheezes or rhonchi. GI: Soft, nontender, nondistended, BS + x 4. MS: no deformity or atrophy.  Left foot and ankle in Ace wrap with sock over distal end over toes. Skin: warm and dry, no rash. Neuro:  Strength and sensation are intact.  Some slurred speech but easily understood Psych: Normal affect.  Smiles and answers questions appropriately  Accessory Clinical Findings    ECG personally reviewed by me today-normal sinus rhythm with LVH heart rate of 79 bpm- No acute changes  Lab Results  Component Value Date   WBC 7.3 01/20/2022   HGB 13.2 01/20/2022   HCT 38 01/20/2022   MCV 102.1 (H) 12/04/2021   PLT 231 01/20/2022   Lab Results  Component Value Date   CREATININE 0.7 01/20/2022   BUN 21 01/20/2022   NA 139 01/20/2022   K 4.3 01/20/2022  CL 103 01/20/2022   CO2 27 (A) 01/20/2022   Lab Results  Component Value Date   ALT 14 01/20/2022   AST 19 01/20/2022   ALKPHOS 90 01/20/2022   BILITOT 0.9 12/04/2021   Lab Results  Component Value Date   CHOL 175 07/03/2021   HDL 59 07/03/2021   LDLCALC 91 07/03/2021   TRIG 146 07/03/2021   CHOLHDL 2.4 04/05/2020    Lab Results  Component Value Date   HGBA1C 5.9 (H) 05/31/2019  CHA2DS2-VASc Score = 8  This indicates a 10.8% annual risk of stroke. The patient's score is based upon: CHF History: 1 HTN History: 1 Diabetes History: 0 Stroke History: 2 Vascular Disease History: 1 Age Score: 2 Gender Score: 1  Review of Prior Studies: Cardiac Cath 04/25/2019:ost-prox LCx stent @ OM1 ostium =100% CTO, Ost-prox LAD stent ~30% ISR, Ost D1 CTO, prox RCA ~30%. SCG-OM2 patent with ostial stent 30%, LIMA-LAD patent; CTO of SVG-D1.    Echocardiogram 07/25/2021  1. Left ventricular ejection fraction, by estimation, is 60 to 65%. The   left ventricle has normal function. The left ventricle has no regional  wall motion abnormalities. There is moderate asymmetric left ventricular  hypertrophy of the basal-septal  segment. Left ventricular diastolic parameters are indeterminate.   2. Right ventricular systolic function was not well visualized. The right  ventricular size is not well visualized. Tricuspid regurgitation signal is  inadequate for assessing PA pressure.   3. The mitral valve is normal in structure. No evidence of mitral valve  regurgitation. No evidence of mitral stenosis.   4. The aortic valve was not well visualized. Aortic valve regurgitation  is not visualized. Aortic valve sclerosis/calcification is present,  without any evidence of aortic stenosis.   5. The inferior vena cava is normal in size with greater than 50%  respiratory variability, suggesting right atrial pressure of 3 mmHg.   Carotid Doppler Ultrasound 07/14/2021 Right Carotid: Velocities in the right ICA are consistent with a 1-39%  stenosis.  Left Carotid: Velocities in the left ICA are consistent with a 1-39%  stenosis.  Vertebrals: Bilateral vertebral arteries demonstrate antegrade flow.  Subclavians: Normal flow hemodynamics were seen in bilateral subclavian               arteries.   Assessment & Plan  1.  Preoperative cardiac clearance: Unable to identify METS as the patient is debilitated due to back injury as well as left foot injury leading to planned surgery.   Chart reviewed as part of pre-operative protocol coverage. Given past medical history and time since last visit, based on ACC/AHA guidelines, Stephanie Shea would be at acceptable risk for the planned procedure without further cardiovascular testing. Per pharmacy recommendations and Dr.Harding recommendations, she is to hold Eliquis for 2 days prior to planned surgery, and start again ASAP post operatively.   2. PAF: Currently on amiodarone, Eliquis, and metoprolol.  Heart rate  is adequately controlled currently.  Would recommend follow-up TSH along with lipids and LFTs, as she is on amiodarone for surveillance of medication impact.  Recommendations concerning Eliquis as above.  3.  Coronary artery disease: As described above multiple stents to LAD, diagonal 1 and proximal RCA, along with CABG by history.  She remains on secondary prevention with atorvastatin metoprolol and losartan.  Medical management only due to age, core morbidities, and frailty  4.  Lumbar sacral stenosis of the spine: Severely limiting her physical activity and she is wheelchair-bound.  Would recommend as much  physical therapy as possible to allow her to be able to leave wheelchair on brief occasions and for strengthening.  Defer to PCP and orthopedics.  5.  Hypercholesterolemia: Remains on high intensity atorvastatin 80 mg daily.  Follow-up labs should be completed on next visit unless completed by PCP.  Goal of LDL less than 50 in a patient with CABG and multiple stents.  I will route this recommendation to the requesting party via Epic fax function and remove from pre-op pool.  Please call with questions.  Phill Myron. West Pugh, ANP, AACC  02/20/2022, 12:00 PM           For questions or updates, please contact [place] Please consult [email or web address] for contact info under Cardiology/STEMI.   Signed, Phill Myron. West Pugh, ANP, AACC  02/20/2022 11:45 AM     Current medicines are reviewed at length with the patient today.  I have spent 25 min's  dedicated to the care of this patient on the date of this encounter to include pre-visit review of records, assessment, management and diagnostic testing,with shared decision making. Signed, Phill Myron. West Pugh, ANP, AACC   02/20/2022 11:41 AM      Office [phone] Fax [phone]  Notice: This dictation was prepared with Dragon dictation along with smaller phrase technology. Any transcriptional errors that result from  this process are unintentional and may not be corrected upon review.

## 2022-02-19 DIAGNOSIS — I509 Heart failure, unspecified: Secondary | ICD-10-CM | POA: Diagnosis not present

## 2022-02-19 DIAGNOSIS — I130 Hypertensive heart and chronic kidney disease with heart failure and stage 1 through stage 4 chronic kidney disease, or unspecified chronic kidney disease: Secondary | ICD-10-CM | POA: Diagnosis not present

## 2022-02-19 DIAGNOSIS — I69318 Other symptoms and signs involving cognitive functions following cerebral infarction: Secondary | ICD-10-CM | POA: Diagnosis not present

## 2022-02-19 DIAGNOSIS — I69351 Hemiplegia and hemiparesis following cerebral infarction affecting right dominant side: Secondary | ICD-10-CM | POA: Diagnosis not present

## 2022-02-19 DIAGNOSIS — I69391 Dysphagia following cerebral infarction: Secondary | ICD-10-CM | POA: Diagnosis not present

## 2022-02-19 DIAGNOSIS — Z9181 History of falling: Secondary | ICD-10-CM | POA: Diagnosis not present

## 2022-02-20 ENCOUNTER — Ambulatory Visit: Payer: Medicare Other | Attending: Adult Health | Admitting: Adult Health

## 2022-02-20 ENCOUNTER — Encounter: Payer: Self-pay | Admitting: Adult Health

## 2022-02-20 VITALS — BP 103/63 | HR 79 | Wt 172.0 lb

## 2022-02-20 DIAGNOSIS — Z01818 Encounter for other preprocedural examination: Secondary | ICD-10-CM | POA: Diagnosis not present

## 2022-02-20 DIAGNOSIS — I69318 Other symptoms and signs involving cognitive functions following cerebral infarction: Secondary | ICD-10-CM | POA: Diagnosis not present

## 2022-02-20 DIAGNOSIS — Z9181 History of falling: Secondary | ICD-10-CM | POA: Diagnosis not present

## 2022-02-20 DIAGNOSIS — I130 Hypertensive heart and chronic kidney disease with heart failure and stage 1 through stage 4 chronic kidney disease, or unspecified chronic kidney disease: Secondary | ICD-10-CM | POA: Diagnosis not present

## 2022-02-20 DIAGNOSIS — I509 Heart failure, unspecified: Secondary | ICD-10-CM | POA: Diagnosis not present

## 2022-02-20 DIAGNOSIS — I69391 Dysphagia following cerebral infarction: Secondary | ICD-10-CM | POA: Diagnosis not present

## 2022-02-20 DIAGNOSIS — I69351 Hemiplegia and hemiparesis following cerebral infarction affecting right dominant side: Secondary | ICD-10-CM | POA: Diagnosis not present

## 2022-02-20 DIAGNOSIS — I25119 Atherosclerotic heart disease of native coronary artery with unspecified angina pectoris: Secondary | ICD-10-CM | POA: Diagnosis not present

## 2022-02-20 NOTE — Patient Instructions (Signed)
Medication Instructions:  No Changes *If you need a refill on your cardiac medications before your next appointment, please call your pharmacy*   Lab Work: No Labs If you have labs (blood work) drawn today and your tests are completely normal, you will receive your results only by: [place] (if you have MyChart) OR A paper copy in the mail If you have any lab test that is abnormal or we need to change your treatment, we will call you to review the results.   Testing/Procedures: No Testing   Follow-Up: At [facility], you and your health needs are our priority.  As part of our continuing mission to provide you with exceptional heart care, we have created designated Provider Care Teams.  These Care Teams include your primary Cardiologist (physician) and Advanced Practice Providers (APPs -  Physician Assistants and Nurse Practitioners) who all work together to provide you with the care you need, when you need it.  We recommend signing up for the patient portal called "MyChart".  Sign up information is provided on this After Visit Summary.  MyChart is used to connect with patients for Virtual Visits (Telemedicine).  Patients are able to view lab/test results, encounter notes, upcoming appointments, etc.  Non-urgent messages can be sent to your provider as well.   To learn more about what you can do with MyChart, go to [email or web address].    Your next appointment:   6 month(s)  Provider:   Glenetta Hew, MD    Other Instructions Hold Eliquis 2 Days Prior to Surgery once date is determined restart ASAP.

## 2022-02-23 DIAGNOSIS — I130 Hypertensive heart and chronic kidney disease with heart failure and stage 1 through stage 4 chronic kidney disease, or unspecified chronic kidney disease: Secondary | ICD-10-CM | POA: Diagnosis not present

## 2022-02-23 DIAGNOSIS — I69351 Hemiplegia and hemiparesis following cerebral infarction affecting right dominant side: Secondary | ICD-10-CM | POA: Diagnosis not present

## 2022-02-23 DIAGNOSIS — I69391 Dysphagia following cerebral infarction: Secondary | ICD-10-CM | POA: Diagnosis not present

## 2022-02-23 DIAGNOSIS — I509 Heart failure, unspecified: Secondary | ICD-10-CM | POA: Diagnosis not present

## 2022-02-23 DIAGNOSIS — I69318 Other symptoms and signs involving cognitive functions following cerebral infarction: Secondary | ICD-10-CM | POA: Diagnosis not present

## 2022-02-23 DIAGNOSIS — Z9181 History of falling: Secondary | ICD-10-CM | POA: Diagnosis not present

## 2022-02-24 DIAGNOSIS — I130 Hypertensive heart and chronic kidney disease with heart failure and stage 1 through stage 4 chronic kidney disease, or unspecified chronic kidney disease: Secondary | ICD-10-CM | POA: Diagnosis not present

## 2022-02-24 DIAGNOSIS — Z9181 History of falling: Secondary | ICD-10-CM | POA: Diagnosis not present

## 2022-02-24 DIAGNOSIS — I69391 Dysphagia following cerebral infarction: Secondary | ICD-10-CM | POA: Diagnosis not present

## 2022-02-24 DIAGNOSIS — I69318 Other symptoms and signs involving cognitive functions following cerebral infarction: Secondary | ICD-10-CM | POA: Diagnosis not present

## 2022-02-24 DIAGNOSIS — I69351 Hemiplegia and hemiparesis following cerebral infarction affecting right dominant side: Secondary | ICD-10-CM | POA: Diagnosis not present

## 2022-02-24 DIAGNOSIS — I509 Heart failure, unspecified: Secondary | ICD-10-CM | POA: Diagnosis not present

## 2022-02-25 DIAGNOSIS — I69351 Hemiplegia and hemiparesis following cerebral infarction affecting right dominant side: Secondary | ICD-10-CM | POA: Diagnosis not present

## 2022-02-25 DIAGNOSIS — I509 Heart failure, unspecified: Secondary | ICD-10-CM | POA: Diagnosis not present

## 2022-02-25 DIAGNOSIS — I69318 Other symptoms and signs involving cognitive functions following cerebral infarction: Secondary | ICD-10-CM | POA: Diagnosis not present

## 2022-02-25 DIAGNOSIS — I69391 Dysphagia following cerebral infarction: Secondary | ICD-10-CM | POA: Diagnosis not present

## 2022-02-25 DIAGNOSIS — Z9181 History of falling: Secondary | ICD-10-CM | POA: Diagnosis not present

## 2022-02-25 DIAGNOSIS — I130 Hypertensive heart and chronic kidney disease with heart failure and stage 1 through stage 4 chronic kidney disease, or unspecified chronic kidney disease: Secondary | ICD-10-CM | POA: Diagnosis not present

## 2022-02-26 ENCOUNTER — Non-Acute Institutional Stay (INDEPENDENT_AMBULATORY_CARE_PROVIDER_SITE_OTHER): Payer: Medicare Other | Admitting: Nurse Practitioner

## 2022-02-26 ENCOUNTER — Encounter: Payer: Self-pay | Admitting: Nurse Practitioner

## 2022-02-26 DIAGNOSIS — I130 Hypertensive heart and chronic kidney disease with heart failure and stage 1 through stage 4 chronic kidney disease, or unspecified chronic kidney disease: Secondary | ICD-10-CM | POA: Diagnosis not present

## 2022-02-26 DIAGNOSIS — I509 Heart failure, unspecified: Secondary | ICD-10-CM | POA: Diagnosis not present

## 2022-02-26 DIAGNOSIS — I69391 Dysphagia following cerebral infarction: Secondary | ICD-10-CM | POA: Diagnosis not present

## 2022-02-26 DIAGNOSIS — K5901 Slow transit constipation: Secondary | ICD-10-CM | POA: Diagnosis not present

## 2022-02-26 DIAGNOSIS — R627 Adult failure to thrive: Secondary | ICD-10-CM

## 2022-02-26 DIAGNOSIS — Z9181 History of falling: Secondary | ICD-10-CM | POA: Diagnosis not present

## 2022-02-26 DIAGNOSIS — M159 Polyosteoarthritis, unspecified: Secondary | ICD-10-CM | POA: Diagnosis not present

## 2022-02-26 DIAGNOSIS — I69351 Hemiplegia and hemiparesis following cerebral infarction affecting right dominant side: Secondary | ICD-10-CM | POA: Diagnosis not present

## 2022-02-26 DIAGNOSIS — I25119 Atherosclerotic heart disease of native coronary artery with unspecified angina pectoris: Secondary | ICD-10-CM | POA: Diagnosis not present

## 2022-02-26 DIAGNOSIS — R1310 Dysphagia, unspecified: Secondary | ICD-10-CM | POA: Insufficient documentation

## 2022-02-26 DIAGNOSIS — I69318 Other symptoms and signs involving cognitive functions following cerebral infarction: Secondary | ICD-10-CM | POA: Diagnosis not present

## 2022-02-26 NOTE — Assessment & Plan Note (Signed)
Progressing, continue supportive care, under Hospice service.

## 2022-02-26 NOTE — Assessment & Plan Note (Signed)
Will have Bisacodyl 71m suppository pr daily as needed.

## 2022-02-26 NOTE — Assessment & Plan Note (Signed)
Dc all po meds, except Lorazepam, Hydrocodone SL/PO possible.

## 2022-02-26 NOTE — Assessment & Plan Note (Signed)
Continue Norco, may change to Morphine or Oxycodone liquid if needed.

## 2022-02-26 NOTE — Progress Notes (Signed)
Location:   [facility] Room Number: 56 Place of Service:    Provider: Marlana Latus NP  Michale Emmerich X, NP  Patient Care Team: Mairyn Lenahan X, NP as PCP - General (Internal Medicine) Leonie Lumi Winslett, MD as PCP - Cardiology (Cardiology) Shannie Kontos X, NP as Nurse Practitioner (Internal Medicine) Lavonna Monarch, MD (Inactive) as Consulting Physician (Dermatology)  Extended Emergency Contact Information Primary Emergency Contact: [place] Mobile Phone: [phone] Relation: Daughter Secondary Emergency Contact: Weeks,Tiffany Mobile Phone: [phone] Relation: Daughter Preferred language: English Interpreter needed? No  Code Status: DNR Goals of care: Advanced Directive information    01/30/2022   10:19 AM  Advanced Directives  Does Patient Have a Medical Advance Directive? Yes  Type of Paramedic of [place];Living will;Out of facility DNR (pink MOST or yellow form)  Does patient want to make changes to medical advance directive? No - Patient declined  Copy of [place] in Chart? Yes - validated most recent copy scanned in chart (See row information)  Pre-existing out of facility DNR order (yellow form or pink MOST form) Pink MOST/Yellow Form most recent copy in chart - Physician notified to receive inpatient order     Chief Complaint  Patient presents with   Acute Visit    deconditioning    HPI:  Pt is a 88 y.o. female seen today for an acute visit for lethargy, difficulty swallowing, total dependent of ADLs, her goal of care is comfort measures, on Lorazepam, Hydrocodone for pain and irritation     Past Medical History:  Diagnosis Date   Atherosclerosis of native coronary artery 2001   LAD & Cx disease -- BMS PCI in 2001; CABG 2002.  PCI in 2006 (per-op [facility])   Atrial fibrillation and flutter ([place]) 04/2019   New diagnosis-major complaint was chest tightness and dyspnea.   Breast cancer ([place])    Cervix dysplasia     GERD (gastroesophageal reflux disease)    Heart attack ([place]) 2002, 2006   Both events reportedly were perioperatively. She has no recollection. Initial PCI was related to angina symptoms (bilateral arm pain)   High cholesterol    Hypertension    Lumbosacral spinal stenosis    Osteoarthritis    Skin cancer    Sleep apnea    Uses CPAP faithfully   Squamous cell carcinoma of skin 02/12/2020   in situ- left neck-anterior (CX35FU)   Past Surgical History:  Procedure Laterality Date   ABDOMINAL HYSTERECTOMY     bone density     CARDIAC CATHETERIZATION  2001    Dr. Glade Lloyd [facility]) 2 vessel CAD involving LAD-D1 and distal Cx-OM. Appearance of dilated tapered left main with no significant atherosclerosis on IVUS --  BMS PCI of pLAD ([place] BMS 3.0 mm x 15 mm) & with PTCA of ostial D1, PTCA of dLAD.  BMS PCI pCx [facility] BMS 3.5 mm x 11 mm) - unable to reopen dCx-OM (probable distal Atheroembolic occlusion.  Normal LVEF.   COLONOSCOPY     CORONARY ARTERY BYPASS GRAFT  2002   Unsure of details [facility], in [facility])   [place]  2001   ([place]) [place] BMS PCI pLAD (3.0 mm x 15 mm) -PTCA of jailed D1 as well as distal LAD; pCx ([facility] BMS 3.5 mm x 11 mm) -unable to open distalCx-OM -thromboembolic occlusion   DIAGNOSTIC MAMMOGRAM     LEFT HEART CATH AND CORS/GRAFTS ANGIOGRAPHY N/A 04/25/2019   Procedure: LEFT HEART CATH  AND CORS/GRAFTS ANGIOGRAPHY;  Surgeon: Troy Sine, MD;  Location: [facility] INVASIVE CV LAB;;; ost-prox LCx stent @ OM1 ostium =100% CTO, Ost-prox LAD stent ~30% ISR, Ost D1 CTO, prox RCA ~30%. SCG-OM2 patent with ostial stent 30%, LIMA-LAD patent; CTO of SVG-D1.    left knee replacement     MASTECTOMY     NM MYOVIEW LTD  12/2014; 2018   Cardiologist (Dr. Kellie Simmering.  [place], [place] ph# [phone]) -->LEXISCAN: Nonischemic, "normal "   pap smear     tonsilectomy     TRANSTHORACIC ECHOCARDIOGRAM  12/2014   Echocardiogram 12/25/14- normal LVEF and  systolic function, EF 99991111, mild grade 1 diastolic dysfunction   TRANSTHORACIC ECHOCARDIOGRAM  04/24/2019   EF 65 to 70%.  Moderate septal LVH.  GR 1 DD-elevated EDP.  Normal PAP.  Relatively normal valves.  No aortic stenosis.    Allergies  Allergen Reactions   Codeine Nausea Only    Allergies as of 02/26/2022       Reactions   Codeine Nausea Only        Medication List        Accurate as of February 26, 2022 11:22 AM. If you have any questions, ask your nurse or doctor.          amiodarone 100 MG tablet Commonly known as: PACERONE Take 100 mg by mouth daily.   AMLACTIN EX thin layer topically; topical Once A Day - PRN Special Instructions: May apply topically once daily PRN for dry skin.   apixaban 5 MG Tabs tablet Commonly known as: Eliquis Take 1 tablet (5 mg total) by mouth 2 (two) times daily.   atorvastatin 80 MG tablet Commonly known as: LIPITOR Take 1 tablet (80 mg total) by mouth daily.   cyanocobalamin 1000 MCG tablet Commonly known as: VITAMIN B12 Take 1,000 mcg by mouth daily.   docusate sodium 100 MG capsule Commonly known as: COLACE Take 1 capsule (100 mg total) by mouth daily.   DULoxetine 60 MG capsule Commonly known as: CYMBALTA Take 60 mg by mouth daily.   escitalopram 5 MG tablet Commonly known as: Lexapro Take 1 tablet (5 mg total) by mouth daily.   fexofenadine 180 MG tablet Commonly known as: ALLEGRA Take 1 tablet (180 mg total) by mouth daily as needed for allergies or rhinitis.   fluticasone 50 MCG/ACT nasal spray Commonly known as: FLONASE Place 1 spray into both nostrils daily as needed for allergies or rhinitis.   HYDROcodone-acetaminophen 5-325 MG tablet Commonly known as: NORCO/VICODIN Take 1 tablet by mouth every 6 (six) hours.   LORazepam 1 MG tablet Commonly known as: ATIVAN Take 1 tablet (1 mg total) by mouth every 8 (eight) hours.   losartan 25 MG tablet Commonly known as: COZAAR Take 25 mg by mouth  daily.   melatonin 5 MG Tabs Take 5 mg by mouth at bedtime.   metoprolol succinate 50 MG 24 hr tablet Commonly known as: TOPROL-XL Take 1 tablet (50 mg total) by mouth daily. Take with or immediately following a meal.   MILK OF MAGNESIA PO Take by mouth. 1200 MG/15ML (Magnesium Hydroxide) Give 30 ml by mouth every 24 hours as needed for constipation May have one dose in 24hrs for symptoms of constipation   nitroGLYCERIN 0.4 MG SL tablet Commonly known as: NITROSTAT Place 0.4 mg under the tongue as needed for chest pain (Every 5 minutes x 3 doses).   nystatin powder Commonly known as: MYCOSTATIN/NYSTOP Apply 1 Application topically 2 (two) times  daily.   omeprazole 20 MG capsule Commonly known as: PRILOSEC Take 20 mg by mouth daily.   omeprazole 20 MG tablet Commonly known as: PRILOSEC OTC Take 1 tablet (20 mg total) by mouth daily as needed.   polyethylene glycol 17 g packet Commonly known as: MIRALAX / GLYCOLAX Take 17 g by mouth daily. Give with 4 oz of water.   Salonpas Pain Relieving 4 % Generic drug: lidocaine Place 1 patch onto the skin daily.   senna-docusate 8.6-50 MG tablet Commonly known as: Senokot-S Take 2 tablets by mouth at bedtime.   Vitamin D3 50 MCG (2000 UT) Tabs Generic drug: Cholecalciferol Take 2,000 Units by mouth daily after breakfast.   zinc oxide 20 % ointment Apply 1 application topically 3 (three) times daily as needed for irritation. Apply to buttocks after every incontinent episode and as needed for redness.        Review of Systems  Unable to perform ROS: Dementia    Immunization History  Administered Date(s) Administered   Fluad Quad(high Dose 65+) 10/28/2020   Influenza, High Dose Seasonal PF 10/21/2016, 09/24/2017, 10/25/2019, 11/05/2021   Influenza-Unspecified 10/03/2011, 10/04/2012, 01/13/2016, 11/05/2021   Moderna SARS-COV2 Booster Vaccination 11/21/2019, 06/11/2020, 05/30/2021   Moderna Sars-Covid-2 Vaccination  01/16/2019, 02/13/2019, 03/13/2019   PNEUMOCOCCAL CONJUGATE-20 07/03/2021   Pneumococcal-Unspecified 01/13/2016   Tdap 12/02/2017   Tetanus 12/01/2017   Zoster Recombinat (Shingrix) 08/24/2018, 02/22/2020   Pertinent  Health Maintenance Due  Topic Date Due   HEMOGLOBIN A1C  12/01/2019   OPHTHALMOLOGY EXAM  05/30/2021   FOOT EXAM  10/17/2021   INFLUENZA VACCINE  Completed   DEXA SCAN  Completed      02/07/2021   12:47 PM 06/19/2021   12:11 AM 09/11/2021    3:39 PM 12/04/2021    4:51 AM 12/17/2021    4:07 PM  Fall Risk  Falls in the past year? 1  0  1  Was there an injury with Fall? 0  0  1  Fall Risk Category Calculator 1  0  3  Fall Risk Category (Retired) Low  Low  High  (RETIRED) Patient Fall Risk Level Moderate fall risk Moderate fall risk Low fall risk High fall risk High fall risk  Patient at Risk for Falls Due to History of fall(s);Impaired balance/gait;Impaired mobility;Orthopedic patient  No Fall Risks  History of fall(s)  Patient at Risk for Falls Due to - Comments spinal stenosis of lumbar      Fall risk Follow up Falls evaluation completed;Education provided;Falls prevention discussed  Falls evaluation completed  Falls evaluation completed   Functional Status Survey:    Vitals:   02/26/22 1102  BP: 136/88  Pulse: 83  Resp: 18  Temp: 97.6 F (36.4 C)  SpO2: 94%  Weight: 162 lb 3.2 oz (73.6 kg)   Body mass index is 24.66 kg/m. Physical Exam Constitutional:      Comments: Lethargic, open eyes when spoke to   HENT:     Head: Normocephalic and atraumatic.     Nose: Nose normal.     Mouth/Throat:     Mouth: Mucous membranes are dry.  Eyes:     Extraocular Movements: Extraocular movements intact.     Pupils: Pupils are equal, round, and reactive to light.  Cardiovascular:     Rate and Rhythm: Normal rate and regular rhythm.     Heart sounds: Murmur heard.  Pulmonary:     Effort: Pulmonary effort is normal.  Abdominal:     General: Bowel  sounds are  normal.     Palpations: Abdomen is soft.     Tenderness: There is no abdominal tenderness.     Hernia: A hernia is present.     Comments: R inguinal hernia per CT abd.   Musculoskeletal:     Cervical back: Normal range of motion and neck supple.     Right lower leg: No edema.     Left lower leg: No edema.     Comments: From previous exam slightly R arm weakness. The left lower leg and ankle-in cast, toes warm, normal skin color  Skin:    General: Skin is warm and dry.     Comments: S/p right mastectomy.  Lateral right lower leg hematoma Left ankle in the cast.   Neurological:     Motor: Weakness present.     Comments: Oriented to self.   Psychiatric:     Comments: lethargic     Labs reviewed: Recent Labs    06/19/21 0006 06/19/21 0010 08/26/21 0000 10/21/21 0630 12/04/21 0641 01/20/22 0000  NA 140 141   < > 140 138 139  K 4.1 4.0   < > 4.3 4.2 4.3  CL 104 103   < > 102 105 103  CO2 28  --    < > 30* 25 27*  GLUCOSE 108* 106*  --   --  112*  --   BUN 14 17   < > 13 15 21  $ CREATININE 0.85 0.80   < > 0.8 0.84 0.7  CALCIUM 9.8  --    < > 10.0 9.7 9.3  MG  --   --   --   --  2.1  --    < > = values in this interval not displayed.   Recent Labs    03/26/21 0000 12/04/21 0641 01/20/22 0000  AST 15 20 19  $ ALT 12 16 14  $ ALKPHOS 47 54 90  BILITOT  --  0.9  --   PROT  --  7.4  --   ALBUMIN 3.8 3.6 3.4*   Recent Labs    06/19/21 0006 06/19/21 0010 12/04/21 0641 01/20/22 0000  WBC 7.2  --  8.7 7.3  NEUTROABS 3.5  --  5.3 4,000.00  HGB 13.2 13.9 12.9 13.2  HCT 41.8 41.0 39.4 38  MCV 105.0*  --  102.1*  --   PLT 216  --  249 231   Lab Results  Component Value Date   TSH 1.390 07/03/2021   Lab Results  Component Value Date   HGBA1C 5.9 (H) 05/31/2019   Lab Results  Component Value Date   CHOL 175 07/03/2021   HDL 59 07/03/2021   LDLCALC 91 07/03/2021   TRIG 146 07/03/2021   CHOLHDL 2.4 04/05/2020    Significant Diagnostic Results in last 30 days:   No results found.  Assessment/Plan: Failure to thrive in adult Progressing, continue supportive care, under Hospice service.   Dysphagia Dc all po meds, except Lorazepam, Hydrocodone SL/PO possible.   Osteoarthritis of multiple joints Continue Norco, may change to Morphine or Oxycodone liquid if needed.     Family/ staff Communication: plan of care reviewed with the patient and charge nurse.   Labs/tests ordered:  none  Time spend 35 minutes.

## 2022-02-27 DIAGNOSIS — I509 Heart failure, unspecified: Secondary | ICD-10-CM | POA: Diagnosis not present

## 2022-02-27 DIAGNOSIS — Z9181 History of falling: Secondary | ICD-10-CM | POA: Diagnosis not present

## 2022-02-27 DIAGNOSIS — I69391 Dysphagia following cerebral infarction: Secondary | ICD-10-CM | POA: Diagnosis not present

## 2022-02-27 DIAGNOSIS — I69351 Hemiplegia and hemiparesis following cerebral infarction affecting right dominant side: Secondary | ICD-10-CM | POA: Diagnosis not present

## 2022-02-27 DIAGNOSIS — I69318 Other symptoms and signs involving cognitive functions following cerebral infarction: Secondary | ICD-10-CM | POA: Diagnosis not present

## 2022-02-27 DIAGNOSIS — I130 Hypertensive heart and chronic kidney disease with heart failure and stage 1 through stage 4 chronic kidney disease, or unspecified chronic kidney disease: Secondary | ICD-10-CM | POA: Diagnosis not present

## 2022-02-28 DIAGNOSIS — I130 Hypertensive heart and chronic kidney disease with heart failure and stage 1 through stage 4 chronic kidney disease, or unspecified chronic kidney disease: Secondary | ICD-10-CM | POA: Diagnosis not present

## 2022-02-28 DIAGNOSIS — Z9181 History of falling: Secondary | ICD-10-CM | POA: Diagnosis not present

## 2022-02-28 DIAGNOSIS — I509 Heart failure, unspecified: Secondary | ICD-10-CM | POA: Diagnosis not present

## 2022-02-28 DIAGNOSIS — I69318 Other symptoms and signs involving cognitive functions following cerebral infarction: Secondary | ICD-10-CM | POA: Diagnosis not present

## 2022-02-28 DIAGNOSIS — I69351 Hemiplegia and hemiparesis following cerebral infarction affecting right dominant side: Secondary | ICD-10-CM | POA: Diagnosis not present

## 2022-02-28 DIAGNOSIS — I69391 Dysphagia following cerebral infarction: Secondary | ICD-10-CM | POA: Diagnosis not present

## 2022-03-13 DEATH — deceased

## 2022-09-13 IMAGING — CT CT CERVICAL SPINE W/O CM
3 series · 12 of 29 positions shown, 14 images · non-contrast
Comparison: None Available.

CLINICAL DATA: Fall, head and neck trauma.



[Series 5: c_spine 2.0 st · axial · 0.42mm/px · z∈[-236,-134]mm · 4 of 87 slices shown, 5 images]
[im 18/87  soft-tissue]
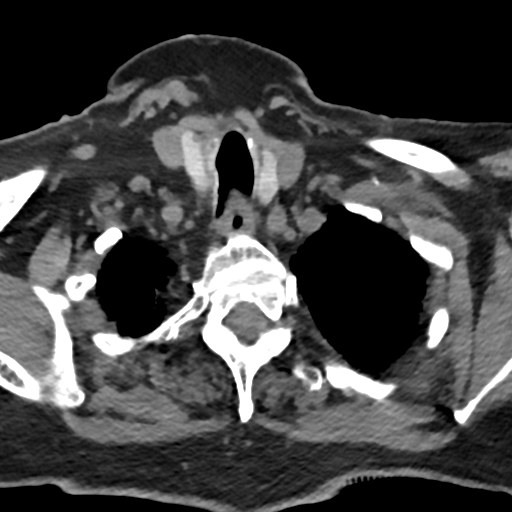
[im 18/87  bone]
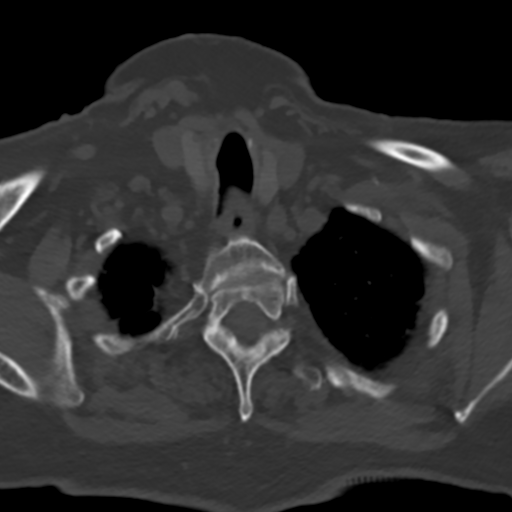
[im 35/87  bone]
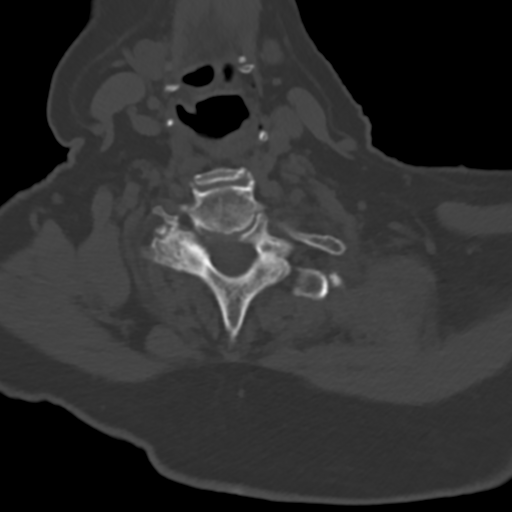
[im 52/87  bone]
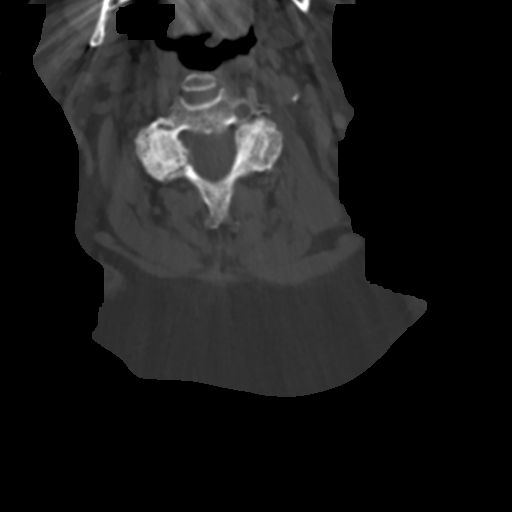
[im 69/87  bone]
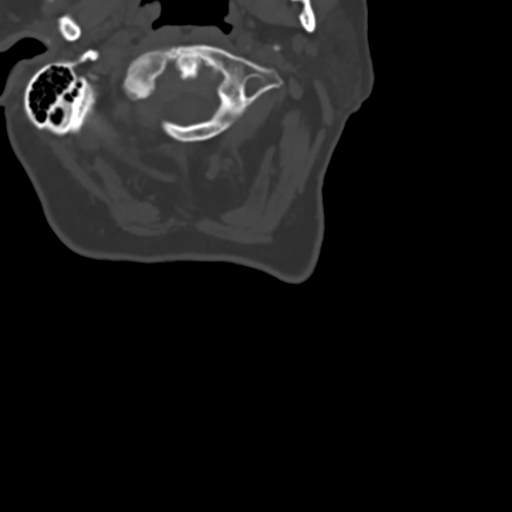

[Series 7: sagittal bone · sagittal · 0.23mm/px · 5 of 61 slices shown, 6 images]
[im 21/61  bone]
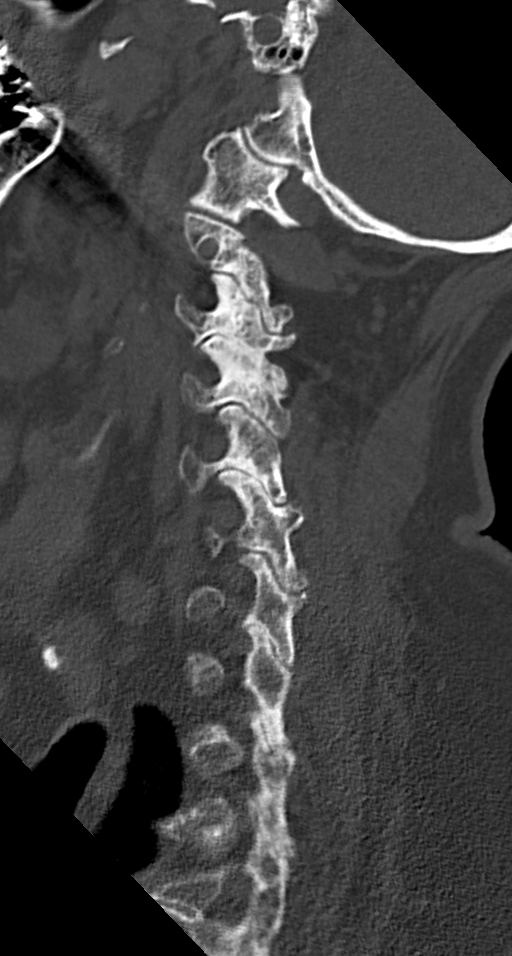
[im 26/61  bone]
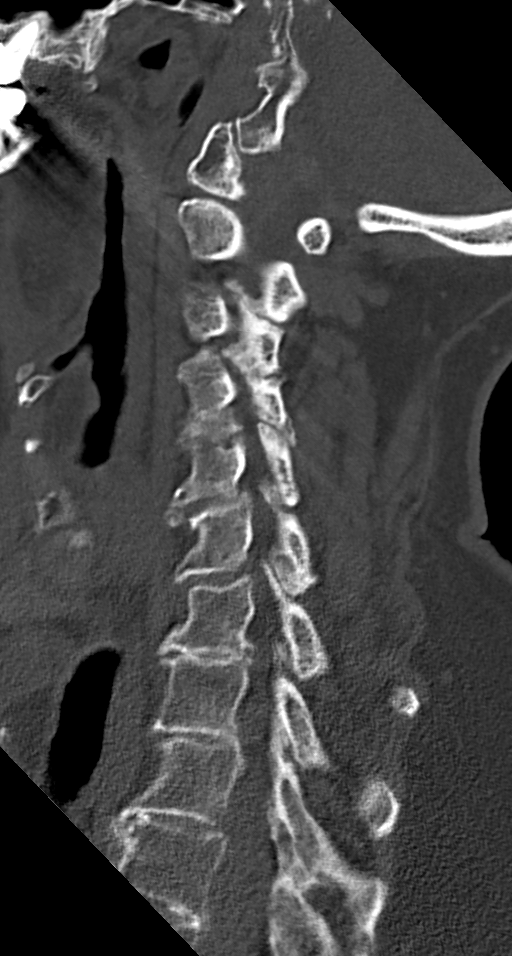
[im 31/61  soft-tissue]
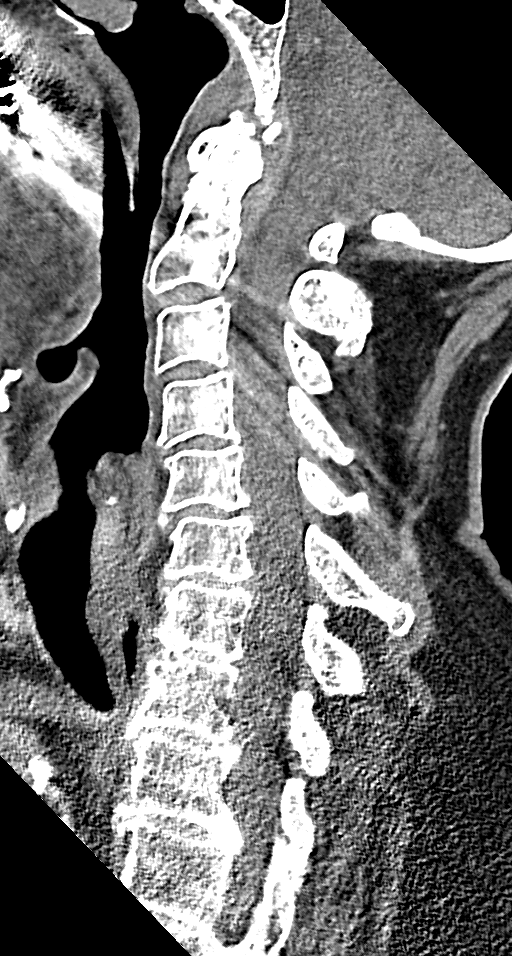
[im 31/61  bone]
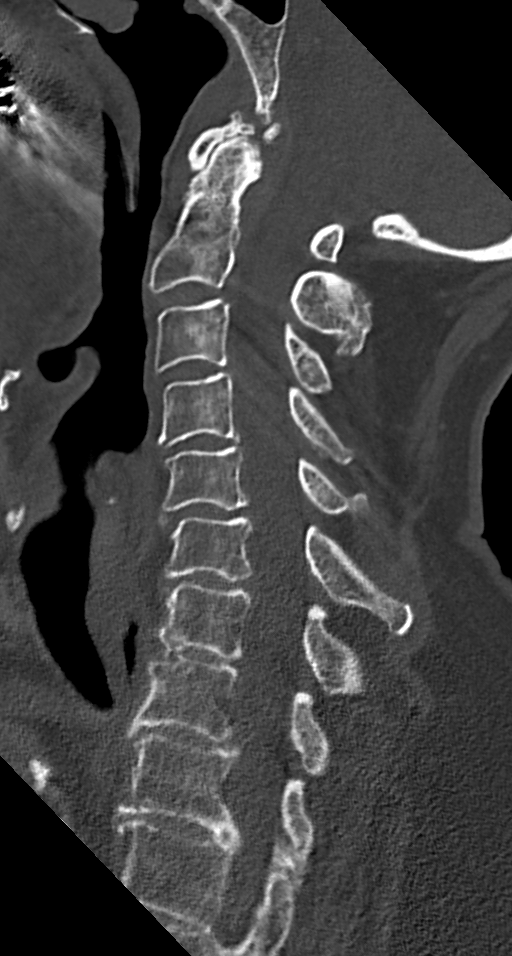
[im 36/61  bone]
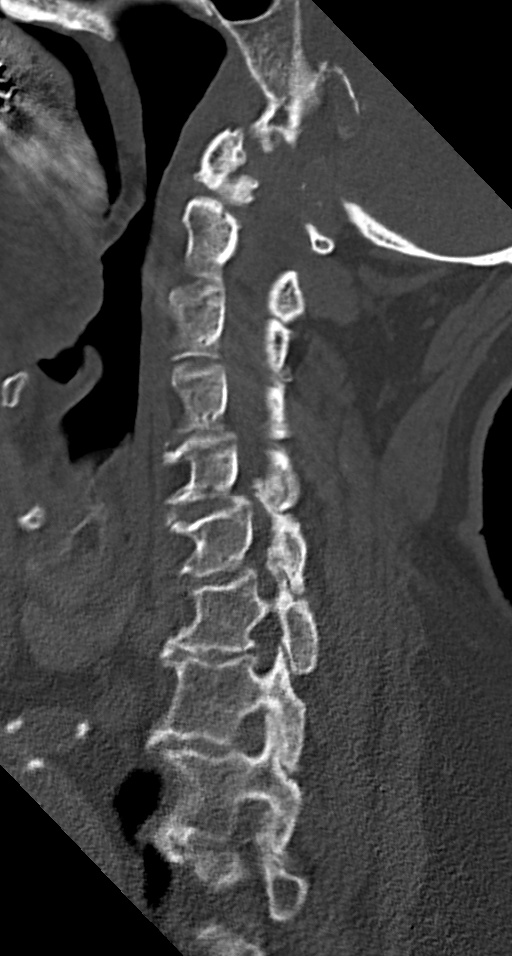
[im 41/61  bone]
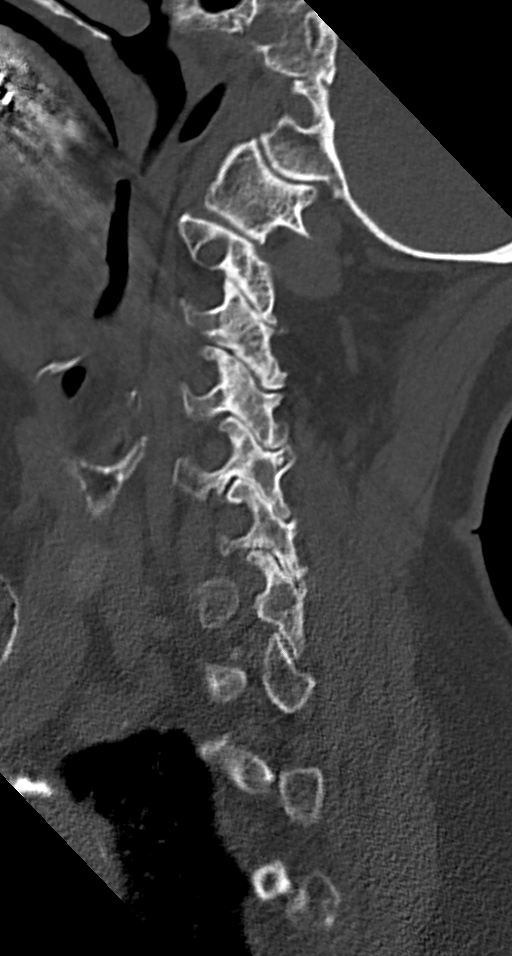

[Series 9: orthogonal axial st · axial · 0.21mm/px · z∈[-267,-227]mm · 3 of 102 slices shown]
[im 15/102  bone]
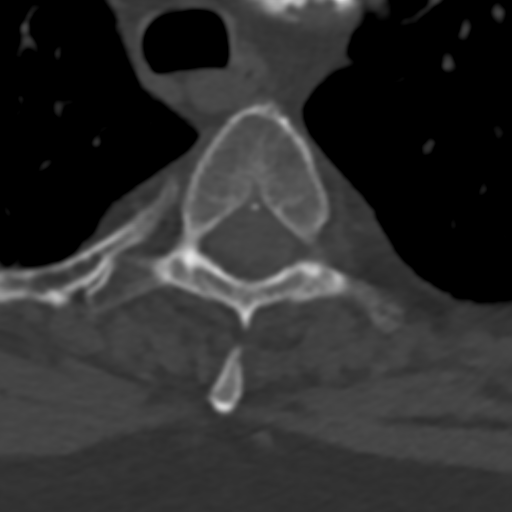
[im 29/102  bone]
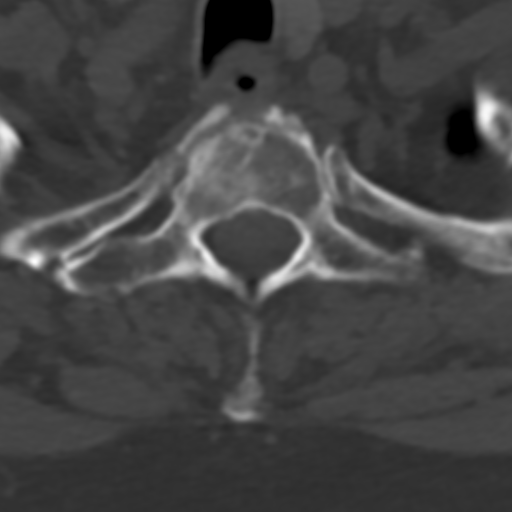
[im 44/102  bone]
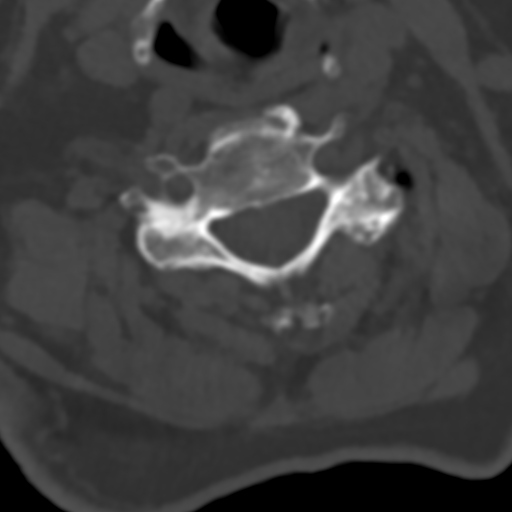

[12 of 29 positions shown; findings below may reference images not displayed]

FINDINGS: CT HEAD FINDINGS

Brain: No acute intracranial hemorrhage, midline shift or mass
effect. No extra-axial fluid collection. Diffuse atrophy is noted.
Subcortical and periventricular white matter hypodensities are noted
bilaterally. No hydrocephalus. An old lacunar infarct is noted in
the basal ganglia on the left.

Vascular: Atherosclerotic calcification of the carotid siphons.
Atherosclerotic calcification and dolichoectasia of the basilar
artery and left vertebral artery.

Skull: Normal. Negative for fracture or focal lesion.

Sinuses/Orbits: Mild mucosal thickening in the ethmoid air cells
bilaterally. No acute orbital abnormality.

Other: Small scalp contusion over the parietal bone on the right.

CT CERVICAL SPINE FINDINGS

Alignment: There is mild anterolisthesis at C4-C5, C5-C6 and C6-C7.

Skull base and vertebrae: No acute fracture. No primary bone lesion
or focal pathologic process.

Soft tissues and spinal canal: No prevertebral fluid or swelling. No
visible canal hematoma.

Disc levels: Multilevel intervertebral disc space narrowing,
uncovertebral osteophyte formation, disc herniations, and facet
arthropathy.

Upper chest: Aortic atherosclerosis.

Other: None.
IMPRESSION: 1. No acute intracranial process.
2. Atrophy with chronic microvascular ischemic changes.
3. Small contusion over the parietal bone on the right.
4. Multilevel degenerative changes in the cervical spine without
evidence of acute fracture.

## 2022-09-13 IMAGING — CT CT HEAD W/O CM
3 series · 15 of 47 positions shown, 18 images · non-contrast
Comparison: None Available.

CLINICAL DATA: Fall, head and neck trauma.



[Series 3: head 5.0 h30s · axial · 0.41mm/px · z∈[-130,+15]mm · 9 of 35 slices shown, 12 images]
[im 3/35  brain]
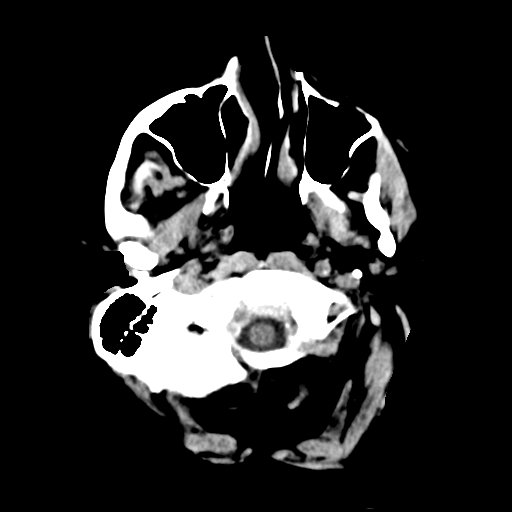
[im 3/35  bone]
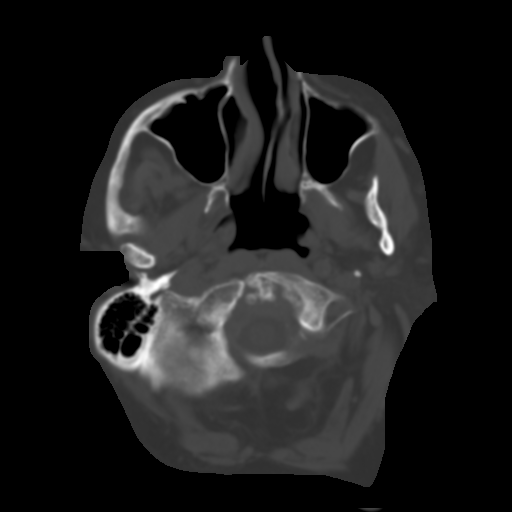
[im 6/35  brain]
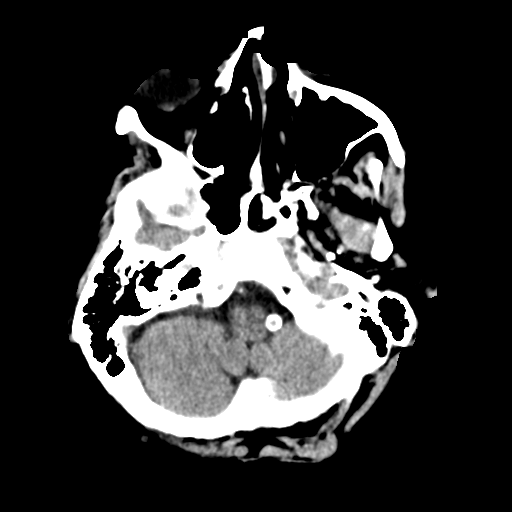
[im 10/35  brain]
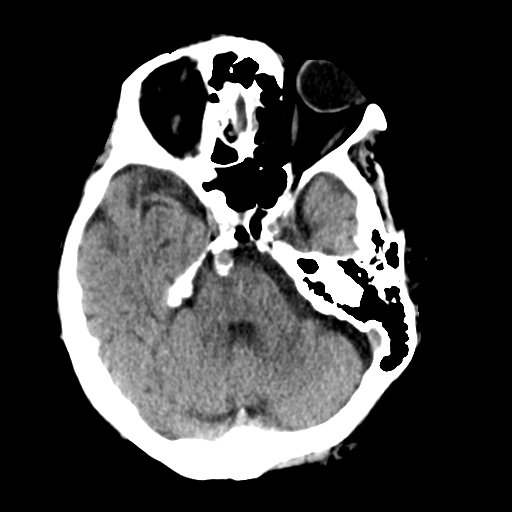
[im 13/35  brain]
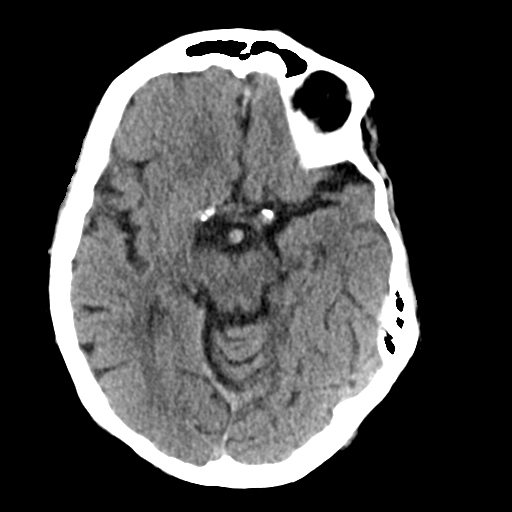
[im 18/35  brain]
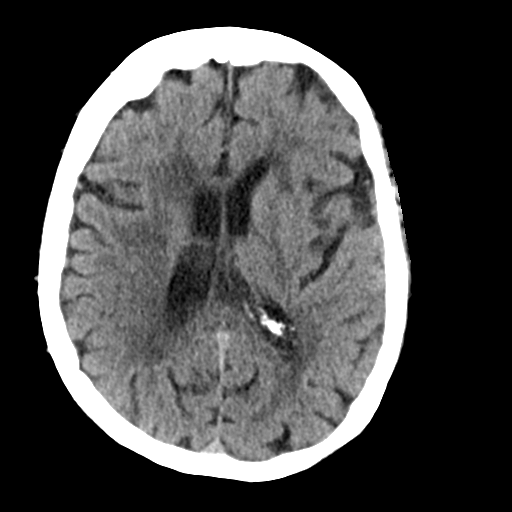
[im 18/35  bone]
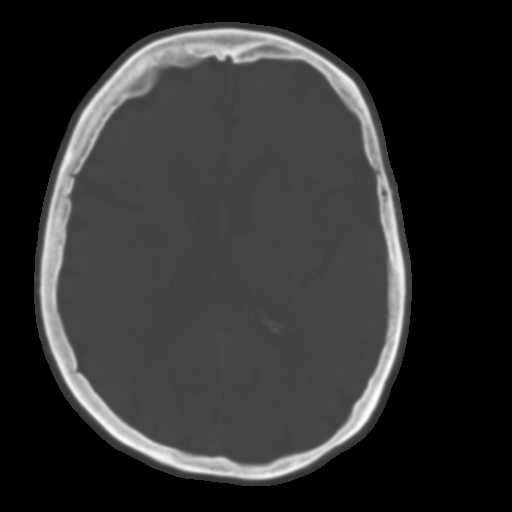
[im 22/35  brain]
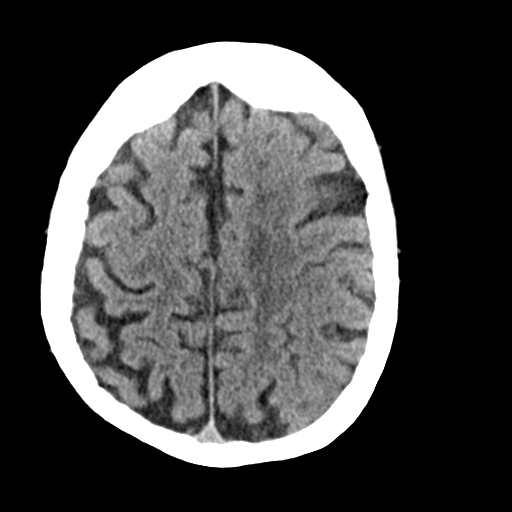
[im 25/35  brain]
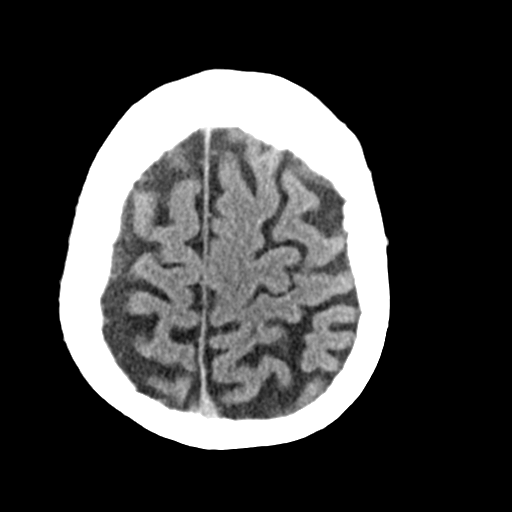
[im 29/35  brain]
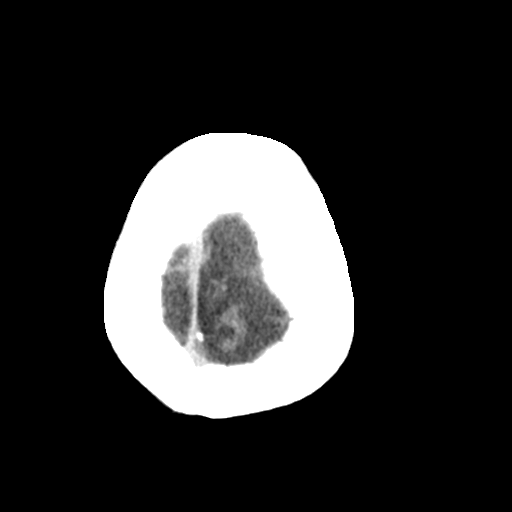
[im 32/35  brain]
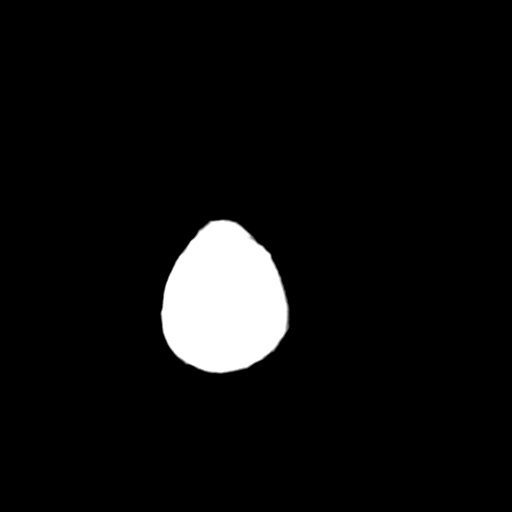
[im 32/35  bone]
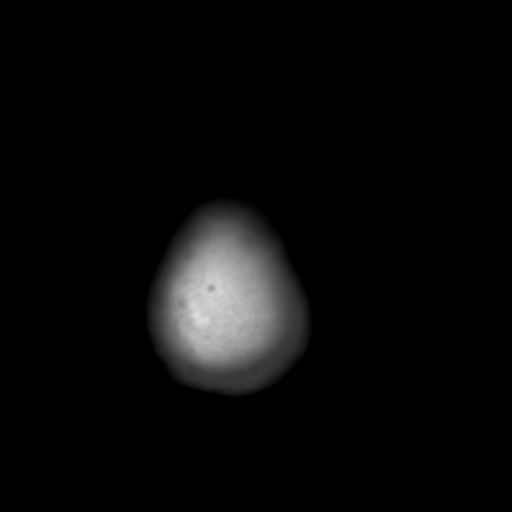

[Series 5: head 3.0 mpr cor · coronal · 0.33mm/px · 3 of 71 slices shown]
[im 24/71  brain]
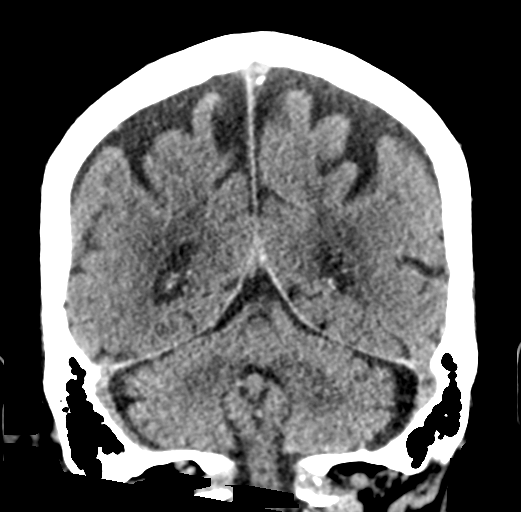
[im 32/71  brain]
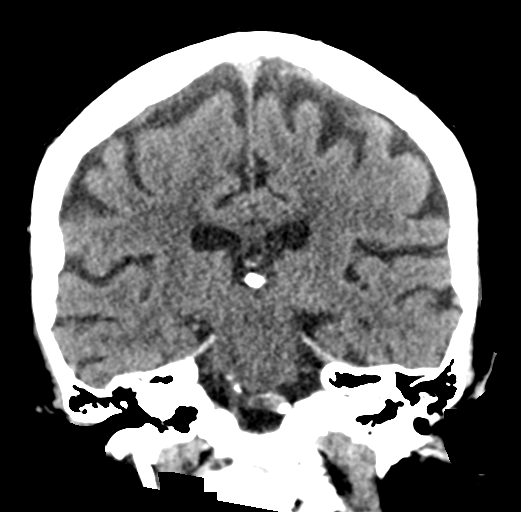
[im 39/71  brain]
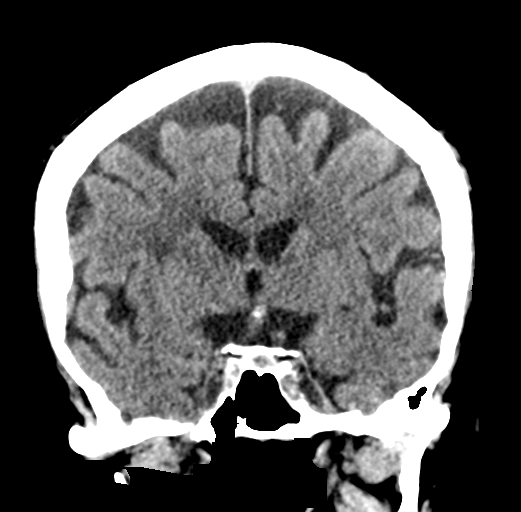

[Series 6: head 3.0 mpr sag · sagittal · 0.33mm/px · 3 of 58 slices shown]
[im 24/58  brain]
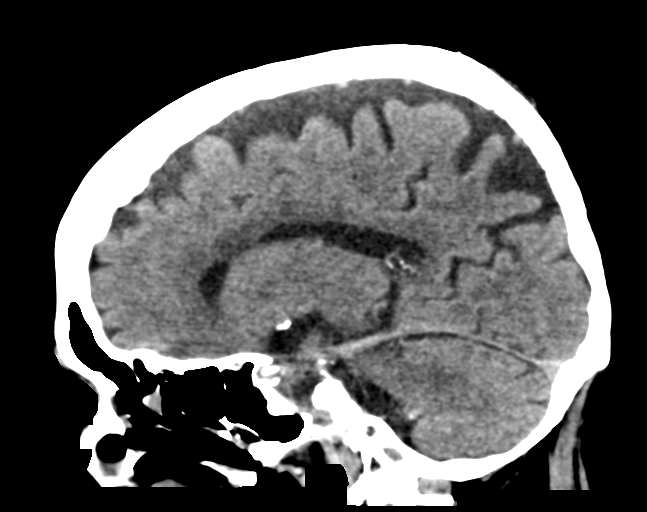
[im 29/58  brain]
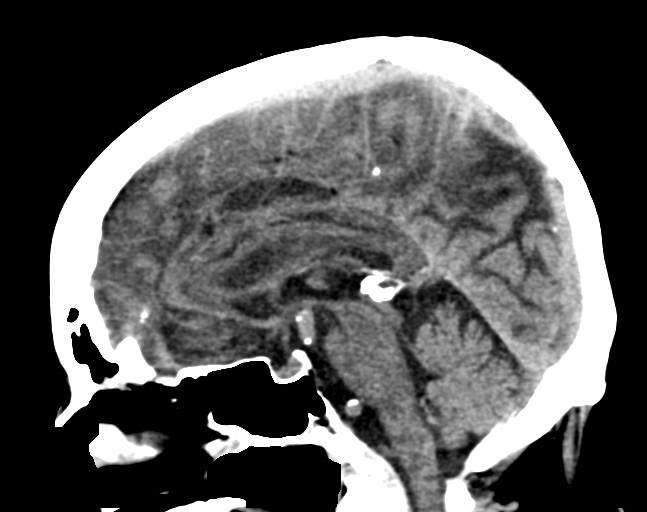
[im 34/58  brain]
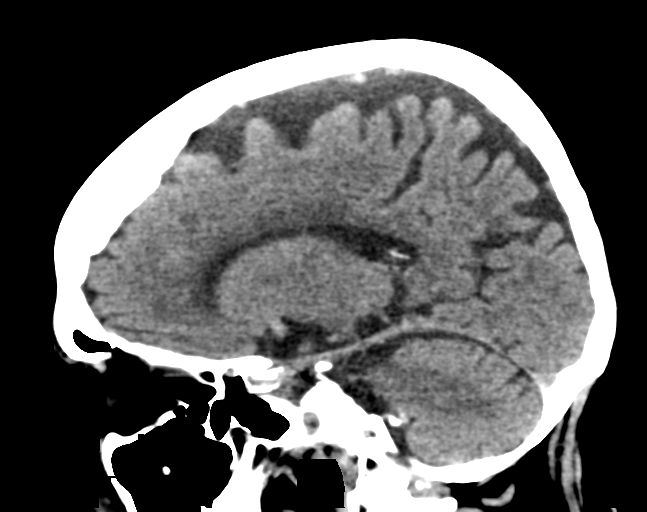

[15 of 47 positions shown; findings below may reference images not displayed]

FINDINGS: CT HEAD FINDINGS

Brain: No acute intracranial hemorrhage, midline shift or mass
effect. No extra-axial fluid collection. Diffuse atrophy is noted.
Subcortical and periventricular white matter hypodensities are noted
bilaterally. No hydrocephalus. An old lacunar infarct is noted in
the basal ganglia on the left.

Vascular: Atherosclerotic calcification of the carotid siphons.
Atherosclerotic calcification and dolichoectasia of the basilar
artery and left vertebral artery.

Skull: Normal. Negative for fracture or focal lesion.

Sinuses/Orbits: Mild mucosal thickening in the ethmoid air cells
bilaterally. No acute orbital abnormality.

Other: Small scalp contusion over the parietal bone on the right.

CT CERVICAL SPINE FINDINGS

Alignment: There is mild anterolisthesis at C4-C5, C5-C6 and C6-C7.

Skull base and vertebrae: No acute fracture. No primary bone lesion
or focal pathologic process.

Soft tissues and spinal canal: No prevertebral fluid or swelling. No
visible canal hematoma.

Disc levels: Multilevel intervertebral disc space narrowing,
uncovertebral osteophyte formation, disc herniations, and facet
arthropathy.

Upper chest: Aortic atherosclerosis.

Other: None.
IMPRESSION: 1. No acute intracranial process.
2. Atrophy with chronic microvascular ischemic changes.
3. Small contusion over the parietal bone on the right.
4. Multilevel degenerative changes in the cervical spine without
evidence of acute fracture.
